# Patient Record
Sex: Female | Born: 1955 | Race: White | Hispanic: No | Marital: Single | Smoking: Current every day smoker
Health system: Southern US, Community
[De-identification: ages and names within clinical notes are randomized; demographics above are authoritative.]

## PROBLEM LIST (undated history)

## (undated) DIAGNOSIS — M5010 Cervical disc disorder with radiculopathy, unspecified cervical region: Secondary | ICD-10-CM

## (undated) DIAGNOSIS — Z720 Tobacco use: Secondary | ICD-10-CM

## (undated) DIAGNOSIS — N189 Chronic kidney disease, unspecified: Secondary | ICD-10-CM

## (undated) DIAGNOSIS — N3010 Interstitial cystitis (chronic) without hematuria: Secondary | ICD-10-CM

## (undated) DIAGNOSIS — I739 Peripheral vascular disease, unspecified: Secondary | ICD-10-CM

## (undated) DIAGNOSIS — H9190 Unspecified hearing loss, unspecified ear: Secondary | ICD-10-CM

## (undated) DIAGNOSIS — I10 Essential (primary) hypertension: Secondary | ICD-10-CM

## (undated) DIAGNOSIS — J449 Chronic obstructive pulmonary disease, unspecified: Secondary | ICD-10-CM

## (undated) DIAGNOSIS — Z87442 Personal history of urinary calculi: Secondary | ICD-10-CM

## (undated) DIAGNOSIS — E785 Hyperlipidemia, unspecified: Secondary | ICD-10-CM

## (undated) DIAGNOSIS — H8109 Meniere's disease, unspecified ear: Secondary | ICD-10-CM

## (undated) HISTORY — PX: CARPAL TUNNEL RELEASE: SHX101

## (undated) HISTORY — DX: Tobacco use: Z72.0

## (undated) HISTORY — PX: OTHER SURGICAL HISTORY: SHX169

## (undated) HISTORY — DX: Cervical disc disorder with radiculopathy, unspecified cervical region: M50.10

## (undated) HISTORY — DX: Essential (primary) hypertension: I10

## (undated) HISTORY — PX: ABDOMINAL HYSTERECTOMY: SHX81

## (undated) HISTORY — DX: Personal history of urinary calculi: Z87.442

## (undated) HISTORY — DX: Interstitial cystitis (chronic) without hematuria: N30.10

## (undated) HISTORY — DX: Hyperlipidemia, unspecified: E78.5

## (undated) HISTORY — PX: KNEE ARTHROSCOPY: SUR90

## (undated) HISTORY — PX: BREAST EXCISIONAL BIOPSY: SUR124

## (undated) HISTORY — DX: Peripheral vascular disease, unspecified: I73.9

## (undated) HISTORY — PX: BREAST SURGERY: SHX581

---

## 1990-08-22 HISTORY — PX: TOTAL ABDOMINAL HYSTERECTOMY W/ BILATERAL SALPINGOOPHORECTOMY: SHX83

## 1998-09-27 LAB — HM PAP SMEAR

## 2003-09-16 ENCOUNTER — Other Ambulatory Visit: Payer: Self-pay

## 2004-12-09 ENCOUNTER — Ambulatory Visit: Payer: Self-pay | Admitting: Urology

## 2006-05-26 ENCOUNTER — Ambulatory Visit: Payer: Self-pay | Admitting: Urology

## 2006-05-29 ENCOUNTER — Ambulatory Visit: Payer: Self-pay | Admitting: Urology

## 2006-05-30 ENCOUNTER — Other Ambulatory Visit: Payer: Self-pay

## 2006-06-01 ENCOUNTER — Inpatient Hospital Stay: Payer: Self-pay | Admitting: Internal Medicine

## 2006-06-01 ENCOUNTER — Other Ambulatory Visit: Payer: Self-pay

## 2006-06-08 ENCOUNTER — Ambulatory Visit: Payer: Self-pay | Admitting: Urology

## 2006-06-14 ENCOUNTER — Ambulatory Visit: Payer: Self-pay | Admitting: Urology

## 2006-10-26 ENCOUNTER — Ambulatory Visit: Payer: Self-pay | Admitting: Urology

## 2007-04-26 ENCOUNTER — Emergency Department: Payer: Self-pay | Admitting: Emergency Medicine

## 2007-07-30 ENCOUNTER — Ambulatory Visit: Payer: Self-pay | Admitting: Urology

## 2007-07-31 ENCOUNTER — Ambulatory Visit: Payer: Self-pay | Admitting: Urology

## 2008-06-25 ENCOUNTER — Ambulatory Visit: Payer: Self-pay | Admitting: Urology

## 2008-07-30 ENCOUNTER — Ambulatory Visit: Payer: Self-pay | Admitting: Urology

## 2009-02-06 ENCOUNTER — Ambulatory Visit: Payer: Self-pay | Admitting: Urology

## 2009-08-28 ENCOUNTER — Ambulatory Visit: Payer: Self-pay | Admitting: Urology

## 2009-09-22 DIAGNOSIS — I739 Peripheral vascular disease, unspecified: Secondary | ICD-10-CM

## 2009-09-22 HISTORY — DX: Peripheral vascular disease, unspecified: I73.9

## 2010-06-03 ENCOUNTER — Ambulatory Visit: Payer: Self-pay | Admitting: Internal Medicine

## 2010-07-13 ENCOUNTER — Ambulatory Visit: Payer: Self-pay | Admitting: Internal Medicine

## 2010-09-01 ENCOUNTER — Ambulatory Visit: Payer: Self-pay | Admitting: Urology

## 2010-11-08 ENCOUNTER — Emergency Department: Payer: Self-pay | Admitting: Emergency Medicine

## 2010-12-19 LAB — HM MAMMOGRAPHY: HM Mammogram: NORMAL

## 2011-01-28 ENCOUNTER — Ambulatory Visit: Payer: Self-pay | Admitting: Urology

## 2011-05-27 ENCOUNTER — Other Ambulatory Visit: Payer: Self-pay | Admitting: Internal Medicine

## 2011-05-27 MED ORDER — BUPROPION HCL ER (SR) 150 MG PO TB12: 150.0000 mg | ORAL_TABLET | Freq: Two times a day (BID) | ORAL | Status: DC

## 2011-07-25 ENCOUNTER — Encounter: Payer: Self-pay | Admitting: Internal Medicine

## 2011-07-25 ENCOUNTER — Other Ambulatory Visit: Payer: Self-pay | Admitting: Internal Medicine

## 2011-07-25 ENCOUNTER — Ambulatory Visit (INDEPENDENT_AMBULATORY_CARE_PROVIDER_SITE_OTHER): Payer: BC Managed Care – PPO | Admitting: Internal Medicine

## 2011-07-25 VITALS — BP 142/80 | HR 98 | Temp 98.2°F | Resp 16 | Ht <= 58 in | Wt 161.5 lb

## 2011-07-25 DIAGNOSIS — F17200 Nicotine dependence, unspecified, uncomplicated: Secondary | ICD-10-CM | POA: Insufficient documentation

## 2011-07-25 DIAGNOSIS — L03211 Cellulitis of face: Secondary | ICD-10-CM | POA: Insufficient documentation

## 2011-07-25 DIAGNOSIS — L0201 Cutaneous abscess of face: Secondary | ICD-10-CM

## 2011-07-25 DIAGNOSIS — Z720 Tobacco use: Secondary | ICD-10-CM | POA: Insufficient documentation

## 2011-07-25 DIAGNOSIS — Z23 Encounter for immunization: Secondary | ICD-10-CM

## 2011-07-25 DIAGNOSIS — Z87891 Personal history of nicotine dependence: Secondary | ICD-10-CM | POA: Insufficient documentation

## 2011-07-25 LAB — CBC WITH DIFFERENTIAL/PLATELET
Basophils Relative: 0.2 % (ref 0.0–3.0)
Eosinophils Relative: 1 % (ref 0.0–5.0)
Lymphocytes Relative: 19.2 % (ref 12.0–46.0)
Neutrophils Relative %: 73.1 % (ref 43.0–77.0)
RBC: 4.63 Mil/uL (ref 3.87–5.11)
WBC: 12.8 10*3/uL — ABNORMAL HIGH (ref 4.5–10.5)

## 2011-07-25 LAB — COMPREHENSIVE METABOLIC PANEL
AST: 19 U/L (ref 0–37)
Albumin: 4.2 g/dL (ref 3.5–5.2)
BUN: 9 mg/dL (ref 6–23)
Calcium: 9.5 mg/dL (ref 8.4–10.5)
Chloride: 102 mEq/L (ref 96–112)
Glucose, Bld: 116 mg/dL — ABNORMAL HIGH (ref 70–99)
Potassium: 3.1 mEq/L — ABNORMAL LOW (ref 3.5–5.1)

## 2011-07-25 MED ORDER — HYDROCODONE-ACETAMINOPHEN 5-325 MG PO TABS: 1.0000 | ORAL_TABLET | Freq: Four times a day (QID) | ORAL | Status: DC | PRN

## 2011-07-25 MED ORDER — HYDROCODONE-ACETAMINOPHEN 5-500 MG PO TABS: 1.0000 | ORAL_TABLET | ORAL | Status: DC | PRN

## 2011-07-25 MED ORDER — AMOXICILLIN-POT CLAVULANATE 875-125 MG PO TABS: 1.0000 | ORAL_TABLET | Freq: Two times a day (BID) | ORAL | Status: AC

## 2011-07-25 NOTE — Patient Instructions (Signed)
You have a cellulitis of the face,  Caused by your bird bite.  Please start the antibiotics  tonight,  return on Wednesday for a quick recheck

## 2011-07-25 NOTE — Progress Notes (Signed)
  Subjective:    Patient ID: Patricia Fields, female    DOB: [birth year], 55 y.o.   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  Yesterday the pain was at its greatest, but today the pain and swelling has improved.  No sinus congestion or rhinorrea,  No conjjuncitvitis ,  No recent travel.  Thinks her pain is due to her "bad teeth."  After detailed questioning she recalls that her Belia Heman bit her on her right cheek 2 days ago but did not think that is was relevant.   Has had suective chills on and off since then.  No drainage form animal bite. Thinks her bird is up to date on vaccinations but doesn't know because the bird was a gift.  Past Medical History  Diagnosis Date  . Peripheral arterial disease Feb 2011    by renal artery duplex  . Hypertension   . Hyperlipidemia   . 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ervical region    Current Outpatient Prescriptions on File Prior to Visit  Medication Sig Dispense Refill  . buPROPion (WELLBUTRIN SR) 150 MG 12 hr tablet Take 1 tablet (150 mg total) by mouth 2 (two) times daily.  60 tablet  3      Review of Systems  Constitutional: Negative for fever, chills and unexpected weight change.  HENT: Negative for hearing loss, ear pain, nosebleeds, congestion, sore throat, facial swelling, rhinorrhea, sneezing, mouth sores, trouble swallowing, neck pain, neck stiffness, voice change, postnasal drip, sinus pressure, tinnitus and ear discharge.   Eyes: Negative for pain, discharge, redness and visual disturbance.  Respiratory: Negative for cough, chest tightness, shortness of breath, wheezing and stridor.   Cardiovascular: Negative for chest pain, palpitations and leg swelling.    Musculoskeletal: Negative for myalgias and arthralgias.  Skin: Negative for color change and rash.  Neurological: Negative for dizziness, weakness, light-headedness and headaches.  Hematological: Negative for adenopathy.       Objective:   Physical Exam  Constitutional: She is oriented to person, place, and time. She appears well-developed and well-nourished.  HENT:  Head:    Mouth/Throat: Oropharynx is clear and moist.  Eyes: EOM are normal. Pupils are equal, round, and reactive to light. No scleral icterus.  Neck: Normal range of motion. Neck supple. No JVD present. No thyromegaly present.  Cardiovascular: Normal rate, regular rhythm, normal heart sounds and intact distal pulses.   Pulmonary/Chest: Effort normal and breath sounds normal.  Abdominal: Soft. Bowel sounds are normal. She exhibits no mass. There is no tenderness.  Musculoskeletal: Normal range of motion. She exhibits no edema.  Lymphadenopathy:    She has no cervical adenopathy.  Neurological: She is alert and oriented to person, place, and time.  Skin: Skin is warm and dry.  Psychiatric: She has a normal mood and affect.          Assessment & Plan:

## 2011-07-26 ENCOUNTER — Encounter: Payer: Self-pay | Admitting: Internal Medicine

## 2011-07-26 DIAGNOSIS — M5010 Cervical disc disorder with radiculopathy, unspecified cervical region: Secondary | ICD-10-CM | POA: Insufficient documentation

## 2011-07-26 DIAGNOSIS — E785 Hyperlipidemia, unspecified: Secondary | ICD-10-CM | POA: Insufficient documentation

## 2011-07-26 DIAGNOSIS — I150 Renovascular hypertension: Secondary | ICD-10-CM | POA: Insufficient documentation

## 2011-07-26 DIAGNOSIS — I739 Peripheral vascular disease, unspecified: Secondary | ICD-10-CM | POA: Insufficient documentation

## 2011-07-26 NOTE — Assessment & Plan Note (Signed)
Still using tobacco despite discussion of risks ad known PAD/.  Brief counselling given.

## 2011-07-26 NOTE — Assessment & Plan Note (Signed)
Will treat empirically with augmentin and obtain vblood cultures and cbc. Recheck in 48 hours and obtain CT of face if no improvement to rule out abscess.

## 2011-07-28 MED ORDER — POTASSIUM CHLORIDE 20 MEQ PO PACK: 20.0000 meq | PACK | Freq: Every day | ORAL | Status: DC

## 2011-07-28 MED ORDER — LISINOPRIL-HYDROCHLOROTHIAZIDE 20-25 MG PO TABS: 1.0000 | ORAL_TABLET | Freq: Every day | ORAL | Status: DC

## 2011-07-28 NOTE — Progress Notes (Signed)
Addended by: Darletta Moll A on: 07/28/2011 10:54 AM   Modules accepted: Orders

## 2011-07-29 ENCOUNTER — Encounter: Payer: Self-pay | Admitting: Internal Medicine

## 2011-07-29 ENCOUNTER — Ambulatory Visit (INDEPENDENT_AMBULATORY_CARE_PROVIDER_SITE_OTHER): Payer: BC Managed Care – PPO | Admitting: Internal Medicine

## 2011-07-29 DIAGNOSIS — L03211 Cellulitis of face: Secondary | ICD-10-CM

## 2011-07-29 DIAGNOSIS — L0201 Cutaneous abscess of face: Secondary | ICD-10-CM

## 2011-07-29 NOTE — Progress Notes (Signed)
Subjective:    Patient ID: Patricia Fields, female    DOB: 12/26/1955, 55 y.o.   MRN: [id number]  HPI  Patricia Fields returns after 48 hours  for followup on facial cellulitis caused by a recent parrot bite to cheek.  She has been taking Augmentin without side effects.  Her facial erythema, pain, and swelling has nearly 100% resolved.   She denies fevers.  Past Medical History  Diagnosis Date  . Peripheral arterial disease Feb 2011    by renal artery duplex  . Hypertension   . Hyperlipidemia   . 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ervical region     Review of Systems  Constitutional: Negative for fever, chills and unexpected weight change.  HENT: Negative for hearing loss, ear pain, nosebleeds, congestion, sore throat, facial swelling, rhinorrhea, sneezing, mouth sores, trouble swallowing, neck pain, neck stiffness, voice change, postnasal drip, sinus pressure, tinnitus and ear discharge.   Eyes: Negative for pain, discharge, redness and visual disturbance.  Respiratory: Negative for cough, chest tightness, shortness of breath, wheezing and stridor.   Cardiovascular: Negative for chest pain, palpitations and leg swelling.  Musculoskeletal: Negative for myalgias and arthralgias.  Skin: Negative for color change and rash.  Neurological: Negative for dizziness, weakness, light-headedness and headaches.  Hematological: Negative for adenopathy.   Current Outpatient Prescriptions on File Prior to Visit  Medication Sig Dispense Refill  . amoxicillin-clavulanate (AUGMENTIN) 875-125 MG per tablet Take 1 tablet by mouth 2 (two) times daily.  14 tablet  0  . aspirin 81 MG tablet Take 81 mg by mouth daily.        [name] buPROPion (WELLBUTRIN SR) 150 MG 12 hr tablet Take 1 tablet (150 mg total) by mouth 2 (two) times daily.  60 tablet  3  . Cholecalciferol (VITAMIN D3) 1000 UNITS CAPS Take by mouth.        [name] HYDROcodone-acetaminophen (NORCO) 5-325 MG per tablet Take 1 tablet by mouth every 6 (six)  hours as needed.  30 tablet  0  . lisinopril-hydrochlorothiazide (PRINZIDE,ZESTORETIC) 20-25 MG per tablet Take 1 tablet by mouth daily.  30 tablet  3  . metoprolol succinate (TOPROL-XL) 25 MG 24 hr tablet Take 25 mg by mouth daily.        . potassium chloride (KLOR-CON) 20 MEQ packet Take 20 mEq by mouth daily. With food for 7 days.  7 tablet  0  . Red Yeast Rice Extract (RED YEAST RICE PO) Take by mouth.        . vitamin B-12 (CYANOCOBALAMIN) 500 MCG tablet Take 500 mcg by mouth daily.             Objective:   Physical Exam  Constitutional: She is oriented to person, place, and time. She appears well-developed and well-nourished.  HENT:  Mouth/Throat: Oropharynx is clear and moist.  Eyes: EOM are normal. Pupils are equal, round, and reactive to light. No scleral icterus.  Neck: Normal range of motion. Neck supple. No JVD present. No thyromegaly present.  Cardiovascular: Normal rate, regular rhythm, normal heart sounds and intact distal pulses.   Pulmonary/Chest: Effort normal and breath sounds normal.  Abdominal: Soft. Bowel sounds are normal. She exhibits no mass. There is no tenderness.  Musculoskeletal: Normal range of motion. She exhibits no edema.  Lymphadenopathy:    She has no cervical adenopathy.  Neurological: She is alert and oriented to person, place, and time.  Skin: Skin is warm and dry.  Psychiatric: She has a normal mood and  affect.          Assessment & Plan:

## 2011-07-31 NOTE — Assessment & Plan Note (Addendum)
Zoonotic infection, secondary to parrot bite, now clinically resolved.  Blood cultures were negative .  Continue Augmen in for a total of 7 days.  She received her TdaP as a booster on Dec 3

## 2011-08-01 LAB — CULTURE, BLOOD (SINGLE): Organism ID, Bacteria: NO GROWTH

## 2011-08-03 ENCOUNTER — Ambulatory Visit: Payer: BC Managed Care – PPO | Admitting: Internal Medicine

## 2011-08-18 ENCOUNTER — Encounter: Payer: Self-pay | Admitting: Internal Medicine

## 2011-09-08 ENCOUNTER — Ambulatory Visit: Payer: Self-pay | Admitting: Urology

## 2011-09-28 LAB — HM MAMMOGRAPHY: HM Mammogram: NORMAL

## 2011-10-13 ENCOUNTER — Ambulatory Visit (INDEPENDENT_AMBULATORY_CARE_PROVIDER_SITE_OTHER): Payer: BC Managed Care – PPO | Admitting: Internal Medicine

## 2011-10-13 ENCOUNTER — Telehealth: Payer: Self-pay | Admitting: *Deleted

## 2011-10-13 ENCOUNTER — Encounter: Payer: Self-pay | Admitting: Internal Medicine

## 2011-10-13 VITALS — BP 140/70 | HR 92 | Temp 98.3°F | Wt 161.0 lb

## 2011-10-13 DIAGNOSIS — R059 Cough, unspecified: Secondary | ICD-10-CM

## 2011-10-13 DIAGNOSIS — F17200 Nicotine dependence, unspecified, uncomplicated: Secondary | ICD-10-CM

## 2011-10-13 DIAGNOSIS — R05 Cough: Secondary | ICD-10-CM

## 2011-10-13 DIAGNOSIS — Z720 Tobacco use: Secondary | ICD-10-CM

## 2011-10-13 DIAGNOSIS — J40 Bronchitis, not specified as acute or chronic: Secondary | ICD-10-CM

## 2011-10-13 MED ORDER — HYDROCODONE-ACETAMINOPHEN 5-325 MG PO TABS: 1.0000 | ORAL_TABLET | Freq: Four times a day (QID) | ORAL | Status: DC | PRN

## 2011-10-13 MED ORDER — AMOXICILLIN-POT CLAVULANATE 875-125 MG PO TABS: 1.0000 | ORAL_TABLET | Freq: Two times a day (BID) | ORAL | Status: AC

## 2011-10-13 MED ORDER — PREDNISONE (PAK) 10 MG PO TABS: ORAL_TABLET | ORAL | Status: AC

## 2011-10-13 MED ORDER — ALBUTEROL SULFATE (5 MG/ML) 0.5% IN NEBU
2.5000 mg | INHALATION_SOLUTION | Freq: Once | RESPIRATORY_TRACT | Status: AC
  Administered 2011-10-13: 2.5 mg via RESPIRATORY_TRACT

## 2011-10-13 MED ORDER — METHYLPREDNISOLONE ACETATE 40 MG/ML IJ SUSP
40.0000 mg | Freq: Once | INTRAMUSCULAR | Status: AC
  Administered 2011-10-13: 40 mg via INTRAMUSCULAR

## 2011-10-13 NOTE — Telephone Encounter (Signed)
Pt was seen in office today, says a cough medicine was to have been called to [facility] but wasn't.  I saw order for norco in chart note from today, called as written to [facility] [address].

## 2011-10-13 NOTE — Progress Notes (Signed)
Subjective:    Patient ID: Patricia Fields, female    DOB: 15-Oct-1955, 56 y.o.   MRN: [id number]  HPI  56 yr old smoker presents with 3 day history of cough , wheezing, fever.  Cough is nonproductive,  No achy feelings.  Started with sinus congestion and yellow sinus drainage .  Ears habe been feeling plugged and left ear is painful.  Still smoking.   Past Medical History  Diagnosis Date  . Peripheral arterial disease Feb 2011    by renal artery duplex  . Hypertension   . Hyperlipidemia   . 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ervical region    Current Outpatient Prescriptions on File Prior to Visit  Medication Sig Dispense Refill  . aspirin 81 MG tablet Take 81 mg by mouth daily.        [name] buPROPion (WELLBUTRIN SR) 150 MG 12 hr tablet Take 1 tablet (150 mg total) by mouth 2 (two) times daily.  60 tablet  3  . Cholecalciferol (VITAMIN D3) 1000 UNITS CAPS Take by mouth.        [name] lisinopril-hydrochlorothiazide (PRINZIDE,ZESTORETIC) 20-25 MG per tablet Take 1 tablet by mouth daily.  30 tablet  3  . metoprolol succinate (TOPROL-XL) 25 MG 24 hr tablet Take 25 mg by mouth daily.        . potassium chloride (KLOR-CON) 20 MEQ packet Take 20 mEq by mouth daily. With food for 7 days.  7 tablet  0  . Red Yeast Rice Extract (RED YEAST RICE PO) Take by mouth.        . vitamin B-12 (CYANOCOBALAMIN) 500 MCG tablet Take 500 mcg by mouth daily.          Review of Systems  Constitutional: Negative for fever, chills and unexpected weight change.  HENT: Negative for hearing loss, ear pain, nosebleeds, congestion, sore throat, facial swelling, rhinorrhea, sneezing, mouth sores, trouble swallowing, neck pain, neck stiffness, voice change, postnasal drip, sinus pressure, tinnitus and ear discharge.   Eyes: Negative for pain, discharge, redness and visual disturbance.  Respiratory: Negative for cough, chest tightness, shortness of breath, wheezing and stridor.   Cardiovascular: Negative for chest pain,  palpitations and leg swelling.  Musculoskeletal: Negative for myalgias and arthralgias.  Skin: Negative for color change and rash.  Neurological: Negative for dizziness, weakness, light-headedness and headaches.  Hematological: Negative for adenopathy.       Objective:   Physical Exam  Constitutional: She is oriented to person, place, and time. She appears well-developed and well-nourished.  HENT:  Mouth/Throat: Oropharynx is clear and moist.  Eyes: EOM are normal. Pupils are equal, round, and reactive to light. No scleral icterus.  Neck: Normal range of motion. Neck supple. No JVD present. No thyromegaly present.  Cardiovascular: Normal rate, regular rhythm, normal heart sounds and intact distal pulses.   Pulmonary/Chest: Effort normal and breath sounds normal.  Abdominal: Soft. Bowel sounds are normal. She exhibits no mass. There is no tenderness.  Musculoskeletal: Normal range of motion. She exhibits no edema.  Lymphadenopathy:    She has no cervical adenopathy.  Neurological: She is alert and oriented to person, place, and time.  Skin: Skin is warm and dry.  Psychiatric: She has a normal mood and affect.      Assessment & Plan:   Bronchitis With concufrrent sinusitis.  ABx presnidosne, decongestant,  MDIs Smoking cessation advised.   Tobacco abuse counselling given; cessation recommended, she has not smoked in 3 days.     Updated  Medication List Outpatient Encounter Prescriptions as of 10/13/2011  Medication Sig Dispense Refill  . amoxicillin-clavulanate (AUGMENTIN) 875-125 MG per tablet Take 1 tablet by mouth 2 (two) times daily. 6 tablets on Day 1 , decrease by 1 tablet daily until gone  14 tablet  10  . aspirin 81 MG tablet Take 81 mg by mouth daily.        [name] buPROPion (WELLBUTRIN SR) 150 MG 12 hr tablet Take 1 tablet (150 mg total) by mouth 2 (two) times daily.  60 tablet  3  . Cholecalciferol (VITAMIN D3) 1000 UNITS CAPS Take by mouth.        [name]  HYDROcodone-acetaminophen (NORCO) 5-325 MG per tablet Take 1 tablet by mouth every 6 (six) hours as needed (cough ).  60 tablet  0  . lisinopril-hydrochlorothiazide (PRINZIDE,ZESTORETIC) 20-25 MG per tablet Take 1 tablet by mouth daily.  30 tablet  3  . metoprolol succinate (TOPROL-XL) 25 MG 24 hr tablet Take 25 mg by mouth daily.        . potassium chloride (KLOR-CON) 20 MEQ packet Take 20 mEq by mouth daily. With food for 7 days.  7 tablet  0  . predniSONE (STERAPRED UNI-PAK) 10 MG tablet 6 tablets on Day 1 , then reduce by 1 tablet daily until gone  21 tablet  0  . Red Yeast Rice Extract (RED YEAST RICE PO) Take by mouth.        . vitamin B-12 (CYANOCOBALAMIN) 500 MCG tablet Take 500 mcg by mouth daily.        [name] DISCONTD: HYDROcodone-acetaminophen (NORCO) 5-325 MG per tablet Take 1 tablet by mouth every 6 (six) hours as needed.  30 tablet  0  . albuterol (PROVENTIL) (5 MG/ML) 0.5% nebulizer solution 2.5 mg       . methylPREDNISolone acetate (DEPO-MEDROL) injection 40 mg

## 2011-10-13 NOTE — Patient Instructions (Signed)
Simply Saline flush sinuses twice daily   Augmentin for the infection twice daily for one week   Afrin nasal spray as needed for congestion  Prednisone taper (steroid) 6 tablets all at once on Day 1 , then 5 tablets the next day,  4 the next day and so on

## 2011-10-16 ENCOUNTER — Encounter: Payer: Self-pay | Admitting: Internal Medicine

## 2011-10-16 DIAGNOSIS — J449 Chronic obstructive pulmonary disease, unspecified: Secondary | ICD-10-CM | POA: Insufficient documentation

## 2011-10-16 DIAGNOSIS — J441 Chronic obstructive pulmonary disease with (acute) exacerbation: Secondary | ICD-10-CM | POA: Insufficient documentation

## 2011-10-16 NOTE — Assessment & Plan Note (Signed)
With concufrrent sinusitis.  ABx presnidosne, decongestant,  MDIs Smoking cessation advised.

## 2011-10-16 NOTE — Assessment & Plan Note (Signed)
counselling given; cessation recommended, she has not smoked in 3 days.

## 2011-11-24 ENCOUNTER — Other Ambulatory Visit: Payer: Self-pay | Admitting: Internal Medicine

## 2011-12-15 ENCOUNTER — Other Ambulatory Visit: Payer: Self-pay | Admitting: Internal Medicine

## 2012-01-24 ENCOUNTER — Other Ambulatory Visit: Payer: BC Managed Care – PPO

## 2012-01-24 ENCOUNTER — Telehealth: Payer: Self-pay | Admitting: *Deleted

## 2012-01-24 DIAGNOSIS — R739 Hyperglycemia, unspecified: Secondary | ICD-10-CM

## 2012-01-24 DIAGNOSIS — R5383 Other fatigue: Secondary | ICD-10-CM

## 2012-01-24 DIAGNOSIS — E785 Hyperlipidemia, unspecified: Secondary | ICD-10-CM

## 2012-01-24 NOTE — Telephone Encounter (Signed)
Placed in epic,  thanks

## 2012-01-24 NOTE — Telephone Encounter (Signed)
Patient is coming in for labs tomorrow. What labs would you like for me to order?

## 2012-01-25 ENCOUNTER — Other Ambulatory Visit (INDEPENDENT_AMBULATORY_CARE_PROVIDER_SITE_OTHER): Payer: BC Managed Care – PPO | Admitting: *Deleted

## 2012-01-25 DIAGNOSIS — R7309 Other abnormal glucose: Secondary | ICD-10-CM

## 2012-01-25 DIAGNOSIS — R5381 Other malaise: Secondary | ICD-10-CM

## 2012-01-25 DIAGNOSIS — R5383 Other fatigue: Secondary | ICD-10-CM

## 2012-01-25 DIAGNOSIS — R739 Hyperglycemia, unspecified: Secondary | ICD-10-CM

## 2012-01-25 DIAGNOSIS — E785 Hyperlipidemia, unspecified: Secondary | ICD-10-CM

## 2012-01-25 LAB — CBC WITH DIFFERENTIAL/PLATELET
Basophils Absolute: 0 10*3/uL (ref 0.0–0.1)
Basophils Relative: 0.3 % (ref 0.0–3.0)
Eosinophils Absolute: 0.2 10*3/uL (ref 0.0–0.7)
Hemoglobin: 14.1 g/dL (ref 12.0–15.0)
MCHC: 33.9 g/dL (ref 30.0–36.0)
MCV: 96 fl (ref 78.0–100.0)
Monocytes Absolute: 0.4 10*3/uL (ref 0.1–1.0)
Neutro Abs: 5 10*3/uL (ref 1.4–7.7)
Neutrophils Relative %: 64.1 % (ref 43.0–77.0)
RBC: 4.31 Mil/uL (ref 3.87–5.11)
RDW: 13.6 % (ref 11.5–14.6)

## 2012-01-25 LAB — COMPLETE METABOLIC PANEL WITH GFR
ALT: 19 U/L (ref 0–35)
AST: 16 U/L (ref 0–37)
CO2: 23 mEq/L (ref 19–32)
Calcium: 9.4 mg/dL (ref 8.4–10.5)
Chloride: 107 mEq/L (ref 96–112)
Creat: 0.95 mg/dL (ref 0.50–1.10)
GFR, Est African American: 77 mL/min
Sodium: 142 mEq/L (ref 135–145)
Total Protein: 6.8 g/dL (ref 6.0–8.3)

## 2012-01-25 LAB — LIPID PANEL
Cholesterol: 212 mg/dL — ABNORMAL HIGH (ref 0–200)
Total CHOL/HDL Ratio: 5
VLDL: 32 mg/dL (ref 0.0–40.0)

## 2012-01-27 ENCOUNTER — Encounter: Payer: BC Managed Care – PPO | Admitting: Internal Medicine

## 2012-02-29 ENCOUNTER — Other Ambulatory Visit: Payer: Self-pay | Admitting: Internal Medicine

## 2012-03-07 ENCOUNTER — Ambulatory Visit: Payer: Self-pay | Admitting: Urology

## 2012-03-19 ENCOUNTER — Ambulatory Visit (INDEPENDENT_AMBULATORY_CARE_PROVIDER_SITE_OTHER): Payer: BC Managed Care – PPO | Admitting: Internal Medicine

## 2012-03-19 ENCOUNTER — Encounter: Payer: Self-pay | Admitting: Internal Medicine

## 2012-03-19 VITALS — BP 122/70 | HR 75 | Temp 98.3°F | Resp 16 | Ht 58.5 in | Wt 160.8 lb

## 2012-03-19 DIAGNOSIS — Z124 Encounter for screening for malignant neoplasm of cervix: Secondary | ICD-10-CM

## 2012-03-19 DIAGNOSIS — M5412 Radiculopathy, cervical region: Secondary | ICD-10-CM

## 2012-03-19 DIAGNOSIS — F17200 Nicotine dependence, unspecified, uncomplicated: Secondary | ICD-10-CM

## 2012-03-19 DIAGNOSIS — Z87442 Personal history of urinary calculi: Secondary | ICD-10-CM | POA: Insufficient documentation

## 2012-03-19 DIAGNOSIS — E66811 Obesity, class 1: Secondary | ICD-10-CM | POA: Insufficient documentation

## 2012-03-19 DIAGNOSIS — M25559 Pain in unspecified hip: Secondary | ICD-10-CM

## 2012-03-19 DIAGNOSIS — N3010 Interstitial cystitis (chronic) without hematuria: Secondary | ICD-10-CM | POA: Insufficient documentation

## 2012-03-19 DIAGNOSIS — E669 Obesity, unspecified: Secondary | ICD-10-CM | POA: Insufficient documentation

## 2012-03-19 DIAGNOSIS — Z1239 Encounter for other screening for malignant neoplasm of breast: Secondary | ICD-10-CM

## 2012-03-19 DIAGNOSIS — Z720 Tobacco use: Secondary | ICD-10-CM

## 2012-03-19 DIAGNOSIS — M5010 Cervical disc disorder with radiculopathy, unspecified cervical region: Secondary | ICD-10-CM

## 2012-03-19 DIAGNOSIS — Z1211 Encounter for screening for malignant neoplasm of colon: Secondary | ICD-10-CM | POA: Insufficient documentation

## 2012-03-19 DIAGNOSIS — M25551 Pain in right hip: Secondary | ICD-10-CM

## 2012-03-19 LAB — HM COLONOSCOPY

## 2012-03-19 MED ORDER — PRAVASTATIN SODIUM 20 MG PO TABS: 20.0000 mg | ORAL_TABLET | Freq: Every day | ORAL | Status: DC

## 2012-03-19 MED ORDER — NABUMETONE 750 MG PO TABS: 750.0000 mg | ORAL_TABLET | Freq: Two times a day (BID) | ORAL | Status: AC

## 2012-03-19 NOTE — Assessment & Plan Note (Signed)
She is status post TAH/BSO. Screening is required

## 2012-03-19 NOTE — Assessment & Plan Note (Signed)
Symptoms are currently under control.

## 2012-03-19 NOTE — Assessment & Plan Note (Signed)
Spent 3 minutes discussing risk of continued tobacco abuse, including but not limited to CAD, PAD, hypertension, and CA.  He is not interested in pharmacotherapy at this time. 
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
## 2012-03-19 NOTE — Assessment & Plan Note (Signed)
Mammogram has been ordered. This exam is normal.

## 2012-03-19 NOTE — Patient Instructions (Addendum)
For your right hip:  1)  X rays  2) nabumetone  twice daily   AND    3) tylenol  500  Or 1000  mg twice daily   For your cholesterol :  1) try pravastatin 20 mg daily INSTEAD of red yeast rice if tolerated.  If not,  Resume the red yeast rice twice daily    For your weight;   Consider a Low Glycemic Index Diet and eating 6 smaller meals daily .  This frequent feeding stimulates your metabolism and the lower glycemic index foods will lower your blood sugars:   This is an example of my daily  "Low GI"  Diet:  All of the foods can be found at grocery stores and in bulk at [employer]  club   7 AM Breakfast:  Low carbohydrate Protein  Shakes (I recommend the [employer] "Carb Control" shakes  Or the low carb shakes by Atkins.   Both are available everywhere:  In  cases at [employer]  Or in 4 packs at grocery stores and pharmacies  2.5 carbs  (Alternative is  a toasted Arnold's Sandwhich Thin w/ peanut butter, a "Bagel Thin" with cream cheese and salmon) or  a scrambled egg burrito made with a low carb tortilla .  Avoid cereal and bananas, oatmeal too unless the old fashioned kind that takes 30-40 minutes to prepare.  the rest is overly processed, has minimal fiber, and loaded with carbohydrates!   10 AM: Protein bar by Atkins (the snack size, under 200 cal.  There are many varieties , available widely again or in bulk in limited varieties at [employer])  Other so called "protein bars" tend to be loaded with carbohydrates.  Remember, in food advertising, the word "energy" is synonymous for " carbohydrate."  Lunch: sandwich of [place], (or any lunchmeat or canned tuna), fresh avocado and cheese on a lower carbohydrate pita bread, flatbread, or tortilla . Ok to use mayonnaise. The bread is the only source or carbohydrate that can be decreased (Joseph's makes a pita bread and a flat bread  Are 50 cal and 4 net carbs ; Toufayan makes a low carb flatbread 100 cal and 9 net carbs  and  [place] makes a low carb whole wheat tortilla   210 cal and 6 net carbs)  3 PM:  Mid day :  Another protein bar,  Or a  cheese stick (100 cal, 0 carbs),  Or 1 ounce of  almonds, walnuts, pistachios, pecans, peanuts,  Macadamia nuts. Or a Dannon light n Fit greek yogurt, 80 cal 8 net carbs . Avoid "granola"; the dried cranberries and raisins are loaded with carbohydrates.    6 PM  Dinner:  "mean and green:"  Meat/chicken/fish or a high protein legume; , with a green salad, and a low GI  Veggie (broccoli, cauliflower, green beans, spinach, brussel sprouts. Lima beans) : Avoid "Low fat dressings, Reyne Dumas and [address]! They are loaded with sugar! Instead use ranch, vinagrette,  Blue cheese, etc  9 PM snack : Breyer's "low carb" fudgsicle or  ice cream bar (Carb Smart line), or  Weight Watcher's ice cream bar , or anouther "no sugar added" ice cream; or another protein shake or a serving of fresh fruit with whipped cream (Avoid bananas, pineapple, grapes  and watermelon on a regular basis because they are high in sugar)   Remember that snack Substitutions should be less than 15 to 20 carbs  Per serving. Remember to subtract fiber grams  to get the "net carbs."

## 2012-03-22 ENCOUNTER — Ambulatory Visit (INDEPENDENT_AMBULATORY_CARE_PROVIDER_SITE_OTHER)
Admission: RE | Admit: 2012-03-22 | Discharge: 2012-03-22 | Disposition: A | Discharge status: Home / Self Care | Payer: BC Managed Care – PPO | Source: Ambulatory Visit | Attending: Internal Medicine | Admitting: Internal Medicine

## 2012-03-22 DIAGNOSIS — M25559 Pain in unspecified hip: Secondary | ICD-10-CM

## 2012-03-22 DIAGNOSIS — M25551 Pain in right hip: Secondary | ICD-10-CM

## 2012-03-23 NOTE — Addendum Note (Signed)
Addended by: Duncan Dull on: 03/23/2012 01:20 PM   Modules accepted: Orders

## 2012-03-30 ENCOUNTER — Other Ambulatory Visit: Payer: Self-pay | Admitting: Internal Medicine

## 2012-04-25 ENCOUNTER — Ambulatory Visit: Payer: Self-pay | Admitting: Internal Medicine

## 2012-05-02 ENCOUNTER — Encounter: Payer: Self-pay | Admitting: Internal Medicine

## 2012-05-03 ENCOUNTER — Other Ambulatory Visit: Payer: Self-pay | Admitting: Internal Medicine

## 2012-06-05 ENCOUNTER — Telehealth: Payer: Self-pay | Admitting: Internal Medicine

## 2012-06-05 NOTE — Telephone Encounter (Signed)
Caller: [name]/Patient; Patient Name: Patricia Fields; PCP: Duncan Dull (Adults only); Best Callback Phone Number: [phone]; Reason for call: Cough/Congestion. Onset 05/31/12:  Pt reports that she think she has a sinus infection; she has a headache,  ear ache and she also states the right side of her face is swollen.  Pt is afebrile.  Triaged patient per Upper Respiratory Infection Protocol.  See Provider within 24 hours Disposition for 'Mild to moderate headache for more than 24 hours unrelieved with nonprescription medications'.  Home care advice given.  All appts in [facility] were full.  RN offered appt at [facility] and pt declined.  Pt states she has no one to take her to [facility].  NOTE:  Office please follow up with pt if she can be worked into the schedule at the office.

## 2012-06-05 NOTE — Telephone Encounter (Signed)
Please offer patient appointment tomorrow to make sure she understands the only deal with her sinus infection infection

## 2012-06-07 ENCOUNTER — Telehealth: Payer: Self-pay | Admitting: Internal Medicine

## 2012-06-07 ENCOUNTER — Encounter: Payer: Self-pay | Admitting: Internal Medicine

## 2012-06-07 ENCOUNTER — Ambulatory Visit (INDEPENDENT_AMBULATORY_CARE_PROVIDER_SITE_OTHER): Payer: BC Managed Care – PPO | Admitting: Internal Medicine

## 2012-06-07 ENCOUNTER — Other Ambulatory Visit: Payer: Self-pay | Admitting: Internal Medicine

## 2012-06-07 VITALS — BP 138/80 | HR 83 | Temp 99.2°F | Resp 18 | Ht 59.0 in | Wt 154.0 lb

## 2012-06-07 DIAGNOSIS — J40 Bronchitis, not specified as acute or chronic: Secondary | ICD-10-CM

## 2012-06-07 DIAGNOSIS — J019 Acute sinusitis, unspecified: Secondary | ICD-10-CM

## 2012-06-07 DIAGNOSIS — J069 Acute upper respiratory infection, unspecified: Secondary | ICD-10-CM

## 2012-06-07 MED ORDER — FLUTICASONE PROPIONATE HFA 110 MCG/ACT IN AERO: 1.0000 | INHALATION_SPRAY | Freq: Two times a day (BID) | RESPIRATORY_TRACT | Status: DC

## 2012-06-07 MED ORDER — AZITHROMYCIN 250 MG PO TABS: ORAL_TABLET | ORAL | Status: DC

## 2012-06-07 MED ORDER — ALBUTEROL SULFATE HFA 108 (90 BASE) MCG/ACT IN AERS: 2.0000 | INHALATION_SPRAY | Freq: Four times a day (QID) | RESPIRATORY_TRACT | Status: DC | PRN

## 2012-06-07 MED ORDER — BECLOMETHASONE DIPROPIONATE 80 MCG/ACT IN AERS: 1.0000 | INHALATION_SPRAY | Freq: Two times a day (BID) | RESPIRATORY_TRACT | Status: DC

## 2012-06-07 NOTE — Telephone Encounter (Signed)
The patient can't afford the inhalers is there something else she can take in its place.

## 2012-06-07 NOTE — Patient Instructions (Addendum)
It was nice meeting you today.  I am sorry you are not feeling well.  I am going to prescribe an antibiotic - Zpak.  Take as directed.  I am also going to give you an Albuterol inhaler - 2 puffs 4x/day as needed and Flovent (steroid inhaler) - 1-2 puffs 2x/day.  (Rinse mouth after using the Flovent inhaler).  Saline nasal spray - flush nose at least 2-3x/day.  Let us know if symptoms do not resolve or if any worsening.

## 2012-06-07 NOTE — Telephone Encounter (Signed)
Error

## 2012-06-07 NOTE — Telephone Encounter (Signed)
Patient is coming in this afternoon at 1:30 to see Dr. Lorin Picket.

## 2012-06-08 ENCOUNTER — Encounter: Payer: Self-pay | Admitting: Internal Medicine

## 2012-06-08 NOTE — Progress Notes (Signed)
Patient notified via phone .

## 2012-06-08 NOTE — Progress Notes (Signed)
  Subjective:    Patient ID: Patricia Fields, female    DOB: 02/04/1956, 56 y.o.   MRN: [id number]  HPI 56 year old female with past history of tobacco abuse, hypertension, hypercholesterolemia and bronchitis who comes in today as a work in with concerns regarding increased congestion and cough.  States she noticed some ear pain and then facial swelling approximately 5-6 days ago.  The facial swelling subsided and she denies any significant ear pain now.  She does report some increased nasal congestion and sinus pressure.  Sore throat.  Drinking fluids.  Decreased appetite.  No vomiting or nausea.  Increased chest congestion and cough.  Does smoke 1ppd.  Is not using inhalers.  Has required these in the past (uses when sick).    Past Medical History  Diagnosis Date  . Peripheral arterial disease Feb 2011    by renal artery duplex  . Hypertension   . Hyperlipidemia   . 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 History of nephrolithiasis   . Interstitial cystitis   . Tobacco abuse     one pack daily    Review of Systems Patient denies any headache, lightheadedness or dizziness.  Does report increased sinus pressure and nasal congestion.  Sore throat.  Productive mucus.  No chest pain, tightness or palpatations.  No significant increased shortness of breath.  Increased cough and chest congestion.  No nausea or vomiting.  No abdominal pain or cramping.  No bowel change, such as diarrhea, constipation, BRBPR or melana.  No urine change.        Objective:   Physical Exam Filed Vitals:   06/07/12 1415  BP: 138/80  Pulse: 83  Temp: 99.2 F (37.3 C)  Resp: 27   56 year old female in no acute distress.   HEENT:  Nares - clear slightly erythematous turbinates.  TMs visualized - without erythema.  OP- without lesions or erythema - poor dentition.    Minimal tenderness to palpation over the sinuses.  NECK:  Supple.  Minimal tenderness - right neck.  No adenopathy.       HEART:  Appears to be regular. LUNGS:  Without crackles.  Some wheezing noted with forced expiration.  Increased cough with forced expiration.     Assessment & Plan:  SINUSITIS/URI WITH POSSIBLE BRONCHITIS.  Treat with Zpak as directed.  (Pt requested Zpak.  States works well for her).  Saline nasal spray as directed.  Albuterol neb - given here in the office.  Pt tolerated well.  Lungs improved.  Increased air movement after the neb.  Post neb pulse ox - 97%.  Start Flovent 2 puffs bid and albuterol inhaler qid prn.  Rest.  Fluids.  Explained to her if symptoms changed, worsened or did not resolve - she was to be reevaluated.    SMOKING CESSATION.  Discussed the need to quit with her today.

## 2012-06-12 ENCOUNTER — Other Ambulatory Visit: Payer: Self-pay | Admitting: Internal Medicine

## 2012-06-12 NOTE — Telephone Encounter (Signed)
And and patient received the alternative inhaler: Qvar.  There are chief inhalers but this is the least expensive of the steroid inhalers;  this takes the place of Advair or Symbicort but not albuterol

## 2012-06-13 NOTE — Telephone Encounter (Signed)
Spoke to patient and advised her as directed.

## 2012-09-20 ENCOUNTER — Ambulatory Visit: Payer: BC Managed Care – PPO | Admitting: Internal Medicine

## 2012-09-27 ENCOUNTER — Ambulatory Visit: Payer: Self-pay | Admitting: Internal Medicine

## 2012-09-27 ENCOUNTER — Encounter: Payer: Self-pay | Admitting: Internal Medicine

## 2012-09-27 ENCOUNTER — Ambulatory Visit (INDEPENDENT_AMBULATORY_CARE_PROVIDER_SITE_OTHER): Payer: BC Managed Care – PPO | Admitting: Internal Medicine

## 2012-09-27 VITALS — BP 134/66 | HR 73 | Temp 98.2°F | Resp 16 | Wt 153.5 lb

## 2012-09-27 DIAGNOSIS — J449 Chronic obstructive pulmonary disease, unspecified: Secondary | ICD-10-CM

## 2012-09-27 DIAGNOSIS — F17200 Nicotine dependence, unspecified, uncomplicated: Secondary | ICD-10-CM

## 2012-09-27 DIAGNOSIS — Z720 Tobacco use: Secondary | ICD-10-CM

## 2012-09-27 DIAGNOSIS — M25539 Pain in unspecified wrist: Secondary | ICD-10-CM

## 2012-09-27 DIAGNOSIS — Z23 Encounter for immunization: Secondary | ICD-10-CM

## 2012-09-27 DIAGNOSIS — E669 Obesity, unspecified: Secondary | ICD-10-CM

## 2012-09-27 DIAGNOSIS — M25531 Pain in right wrist: Secondary | ICD-10-CM

## 2012-09-27 DIAGNOSIS — E785 Hyperlipidemia, unspecified: Secondary | ICD-10-CM

## 2012-09-27 MED ORDER — CELECOXIB 200 MG PO CAPS: 200.0000 mg | ORAL_CAPSULE | Freq: Two times a day (BID) | ORAL | Status: DC

## 2012-09-27 MED ORDER — HYDROCODONE-ACETAMINOPHEN 5-325 MG PO TABS: 1.0000 | ORAL_TABLET | Freq: Four times a day (QID) | ORAL | Status: DC | PRN

## 2012-09-27 NOTE — Progress Notes (Signed)
Patient ID: Patricia Fields, female   DOB: Apr 20, 1956, 58 y.o.   MRN: [id number]  Patient Active Problem List  Diagnosis  . Tobacco abuse  . Peripheral arterial disease  . Hypertension  . Hyperlipidemia  . 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ervical region  . Bronchitis  . History of nephrolithiasis  . Interstitial cystitis  . Obesity  . Right hip pain  . Breast screening, unspecified  . Screening for cervical cancer  . Right wrist pain    Subjective:  CC:   Chief Complaint  Patient presents with  . Follow-up    6 month    HPI:   Patricia Lick Smithis a 57 y.o. female who presents for follow up on obesity, COPD and hyperlipidemia.  After her last visit in October she started the low GI diet and  Lost 10 lbs , but gaine 3 over the holidays .    2) new complaint of riight proximal wrist pain  Unclear etiology.  Pain is persistent and occurred after 2 falls.  The first fall was minor,  She fell up a flight of stairs,  But the second fall occurred while crossing a door threshhold and she fell onto her outstretched hand.  This occurred a few weeks ago,  Wrist selled,  Fingers swelled,  Some bruising was noted ,  No prior evaluation.   Has been wearing a lightweight wrist brace which has not offered any real support to the wrist and symptoms have not improved.  The pain worse over the radial head, the anatomic snuff box and is aggravated by resisted flexion of 1 through 4 fingers.     Past Medical History  Diagnosis Date  . Peripheral arterial disease Feb 2011    by renal artery duplex  . Hypertension   . Hyperlipidemia   . 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 History of nephrolithiasis   . Interstitial cystitis   . Tobacco abuse     one pack daily    Past Surgical History  Procedure Date  . Total abdominal hysterectomy w/ bilateral salpingoophorectomy 1992    due to uterine tumor, and endometriosis   . Arthroscopy     left knee , remote  .  Breast surgery     left breast cyst, benign  . Carpal tunnel release     Dr. Hyacinth Meeker,  right hand          The following portions of the patient's history were reviewed and updated as appropriate: Allergies, current medications, and problem list.    Review of Systems:   12 Pt  review of systems was negative except those addressed in the HPI,     History   Social History  . Marital Status: Married    Spouse Name: N/A    Number of Children: N/A  . Years of Education: N/A   Occupational History  . Not on file.   Social History Main Topics  . Smoking status: Current Every Day Smoker -- 1.0 packs/day    Types: Cigarettes  . Smokeless tobacco: Never Used  . Alcohol Use: No  . Drug Use: No  . Sexually Active: Not Currently   Other Topics Concern  . Not on file   Social History Narrative  . No narrative on file    Objective:  BP 134/66  Pulse 73  Temp 98.2 F (36.8 C) (Oral)  Resp 16  Wt 153 lb 8 oz (69.627 kg)  SpO2 99%  General appearance: alert, cooperative and appears stated age Ears: normal TM's and external ear canals both ears Throat: lips, mucosa, and tongue normal; teeth and gums normal Neck: no adenopathy, no carotid bruit, supple, symmetrical, trachea midline and thyroid not enlarged, symmetric, no tenderness/mass/nodules Back: symmetric, no curvature. ROM normal. No CVA tenderness. Lungs: clear to auscultation bilaterally Heart: regular rate and rhythm, S1, S2 normal, no murmur, click, rub or gallop Abdomen: soft, non-tender; bowel sounds normal; no masses,  no organomegaly Pulses: 2+ and symmetric Skin: Skin color, texture, turgor normal. No rashes or lesions Lymph nodes: Cervical, supraclavicular, and axillary nodes normal.  Assessment and Plan: A total of 45 minutes, more than half of which was spent in counselling and coordination of care,  Was spent with patient   Right wrist pain Acute, with signs and symptoms concerning for fracture  or sprain.  X rays and brace ordered. Ortho follow up TBD  Obesity counseling given ,  Advised to resume low GI diet and start exercise program.   Hyperlipidemia Tolerating medications.,  repeat labs needed.   Tobacco abuse She has reduced consumption but has not quit.  The patient was counseled on the dangers of tobacco use, and was advised to quit.  Reviewed strategies to maximize success, including stress management.  COPD (chronic obstructive pulmonary disease) Secondary to tobacco abuse,  Cannot afford albuterol inhaler.  But Qvar was affordable.  Some wheezing noted today .  Samples of Symbicort given.    Updated Medication List Outpatient Encounter Prescriptions as of 09/27/2012  Medication Sig Dispense Refill  . aspirin 81 MG tablet Take 81 mg by mouth daily.        . beclomethasone (QVAR) 80 MCG/ACT inhaler Inhale 1 puff into the lungs 2 (two) times daily.  1 Inhaler  11  . buPROPion (WELLBUTRIN SR) 150 MG 12 hr tablet TAKE ONE TABLET BY MOUTH TWICE DAILY  60 tablet  3  . Cholecalciferol (VITAMIN D3) 1000 UNITS CAPS Take by mouth.        [name] HYDROcodone-acetaminophen (NORCO/VICODIN) 5-325 MG per tablet Take 1 tablet by mouth every 6 (six) hours as needed (cough ).  60 tablet  0  . lisinopril-hydrochlorothiazide (PRINZIDE,ZESTORETIC) 20-25 MG per tablet TAKE ONE TABLET BY MOUTH EVERY DAY  30 tablet  11  . metoprolol succinate (TOPROL-XL) 25 MG 24 hr tablet TAKE ONE TABLET BY MOUTH EVERY DAY  30 tablet  6  . nabumetone (RELAFEN) 750 MG tablet Take 1 tablet (750 mg total) by mouth 2 (two) times daily.  60 tablet  2  . pravastatin (PRAVACHOL) 20 MG tablet Take 1 tablet (20 mg total) by mouth daily.  30 tablet  3  . Red Yeast Rice Extract (RED YEAST RICE PO) Take by mouth.        . vitamin B-12 (CYANOCOBALAMIN) 500 MCG tablet Take 500 mcg by mouth daily.        . vitamin E 100 UNIT capsule Take 100 Units by mouth daily.      . [DISCONTINUED] HYDROcodone-acetaminophen (NORCO) 5-325 MG  per tablet Take 1 tablet by mouth every 6 (six) hours as needed (cough ).  60 tablet  0  . albuterol (PROVENTIL HFA;VENTOLIN HFA) 108 (90 BASE) MCG/ACT inhaler Inhale 2 puffs into the lungs every 6 (six) hours as needed for wheezing.  1 Inhaler  1  . celecoxib (CELEBREX) 200 MG capsule Take 1 capsule (200 mg total) by mouth 2 (two) times daily.  14 capsule  0  .  potassium chloride (KLOR-CON) 20 MEQ packet Take 20 mEq by mouth daily. With food for 7 days.  7 tablet  0  . [DISCONTINUED] azithromycin (ZITHROMAX) 250 MG tablet Take 2 tablets x 1 day the one tablet q day for the next 4 days.  6 tablet  0

## 2012-09-27 NOTE — Patient Instructions (Addendum)
Return for fasting labs next week (call us to schedule a lab appt) . Please  drink  Some water before hand.   You should be using your QVar (beclomethasone) inhaler twice daily  every day to prevent COPD flares.  You can use the symbicort  2 puffs twice daily instead of the Qvar when you have a COPD flare until you can be seen    Regarding your wrist:    X rays of your wrist and forearm to rule out fracture   Celebrex(samples )  twice daily for inflammation and pain for one week,  Then switch to aleve. Twice daily   vicodin as needed for pain   Wear the splint I gave you instead of your other one.,  Itw ill support your wrist

## 2012-09-28 ENCOUNTER — Telehealth: Payer: Self-pay | Admitting: Internal Medicine

## 2012-09-28 ENCOUNTER — Encounter: Payer: Self-pay | Admitting: Internal Medicine

## 2012-09-28 DIAGNOSIS — S63501A Unspecified sprain of right wrist, initial encounter: Secondary | ICD-10-CM

## 2012-09-28 DIAGNOSIS — M25531 Pain in right wrist: Secondary | ICD-10-CM | POA: Insufficient documentation

## 2012-09-28 NOTE — Assessment & Plan Note (Addendum)
counseling given ,  Advised to resume low GI diet and start exercise program.

## 2012-09-28 NOTE — Telephone Encounter (Signed)
xrays did not show any fractures.  She needs to wear the brace until  orthopedics can see her,  Ortho referral in process

## 2012-09-28 NOTE — Assessment & Plan Note (Signed)
Secondary to tobacco abuse,  Cannot afford albuterol inhaler.  But Qvar was affordable.  Some wheezing noted today .  Samples of Symbicort given.

## 2012-09-28 NOTE — Assessment & Plan Note (Signed)
Tolerating medications.,  repeat labs needed.

## 2012-09-28 NOTE — Assessment & Plan Note (Signed)
She has reduced consumption but has not quit.  The patient was counseled on the dangers of tobacco use, and was advised to quit.  Reviewed strategies to maximize success, including stress management.

## 2012-09-28 NOTE — Telephone Encounter (Signed)
See unrouted message  

## 2012-09-28 NOTE — Telephone Encounter (Signed)
Done

## 2012-09-28 NOTE — Assessment & Plan Note (Signed)
Acute, with signs and symptoms concerning for fracture or sprain.  X rays and brace ordered. Ortho follow up TBD

## 2012-10-15 ENCOUNTER — Encounter: Payer: Self-pay | Admitting: Internal Medicine

## 2012-11-01 ENCOUNTER — Ambulatory Visit: Payer: Self-pay | Admitting: Urology

## 2012-12-09 ENCOUNTER — Other Ambulatory Visit: Payer: Self-pay | Admitting: Internal Medicine

## 2012-12-10 NOTE — Telephone Encounter (Signed)
Received refill request electronically. Last office visit 09/27/12. Is it okay to refill medication?

## 2012-12-14 ENCOUNTER — Other Ambulatory Visit: Payer: Self-pay | Admitting: Internal Medicine

## 2013-05-07 ENCOUNTER — Ambulatory Visit (INDEPENDENT_AMBULATORY_CARE_PROVIDER_SITE_OTHER): Payer: BC Managed Care – PPO | Admitting: Adult Health

## 2013-05-07 ENCOUNTER — Encounter: Payer: Self-pay | Admitting: Adult Health

## 2013-05-07 ENCOUNTER — Ambulatory Visit: Payer: Self-pay | Admitting: Adult Health

## 2013-05-07 ENCOUNTER — Telehealth: Payer: Self-pay | Admitting: *Deleted

## 2013-05-07 VITALS — BP 180/78 | HR 87 | Temp 98.3°F | Resp 12 | Ht 58.5 in | Wt 156.0 lb

## 2013-05-07 DIAGNOSIS — M79672 Pain in left foot: Secondary | ICD-10-CM | POA: Insufficient documentation

## 2013-05-07 DIAGNOSIS — R52 Pain, unspecified: Secondary | ICD-10-CM

## 2013-05-07 DIAGNOSIS — M79609 Pain in unspecified limb: Secondary | ICD-10-CM

## 2013-05-07 DIAGNOSIS — J329 Chronic sinusitis, unspecified: Secondary | ICD-10-CM | POA: Insufficient documentation

## 2013-05-07 MED ORDER — AZITHROMYCIN 250 MG PO TABS: ORAL_TABLET | ORAL | Status: DC

## 2013-05-07 NOTE — Assessment & Plan Note (Signed)
Send for xray

## 2013-05-07 NOTE — Progress Notes (Signed)
  Subjective:    Patient ID: Patricia Fields, female    DOB: 08/09/1956, 57 y.o.   MRN: [id number]  HPI  Patient presents clinic with the following concerns:  Pain in left foot. She does not recall any injury to the area. She believes her left foot is broken. Pain ongoing for 2 weeks. She does not recall twisting her ankle or rolling her foot.   She also reports 2 day pressure in sinuses and congestion. She works at [employer] and around [employer] so she knows she is constantly exposed to illness. She reports feeling chilled and she stayed in bed all day yesterday. She reports yellow, greenish drainage from her nose. She has not taken any OTC medication but did drink "white liquor" which made her feel better. Denies shortness of breath or chest pain.   Current Outpatient Prescriptions on File Prior to Visit  Medication Sig Dispense Refill  . albuterol (PROVENTIL HFA;VENTOLIN HFA) 108 (90 BASE) MCG/ACT inhaler Inhale 2 puffs into the lungs every 6 (six) hours as needed for wheezing.  1 Inhaler  1  . aspirin 81 MG tablet Take 81 mg by mouth daily.        . beclomethasone (QVAR) 80 MCG/ACT inhaler Inhale 1 puff into the lungs 2 (two) times daily.  1 Inhaler  11  . buPROPion (WELLBUTRIN SR) 150 MG 12 hr tablet TAKE ONE TABLET BY MOUTH TWICE DAILY  60 tablet  3  . Cholecalciferol (VITAMIN D3) 1000 UNITS CAPS Take by mouth.        [name] lisinopril-hydrochlorothiazide (PRINZIDE,ZESTORETIC) 20-25 MG per tablet TAKE ONE TABLET BY MOUTH EVERY DAY  30 tablet  11  . metoprolol succinate (TOPROL-XL) 25 MG 24 hr tablet TAKE ONE TABLET BY MOUTH EVERY DAY  30 tablet  3  . Red Yeast Rice Extract (RED YEAST RICE PO) Take by mouth.        . vitamin B-12 (CYANOCOBALAMIN) 500 MCG tablet Take 500 mcg by mouth daily.        . vitamin E 100 UNIT capsule Take 100 Units by mouth daily.       No current facility-administered medications on file prior to visit.     Review of Systems  Constitutional: Positive for chills.  Negative for fever.       Stayed in bed all day yesterday  HENT: Positive for congestion, sore throat, postnasal drip and sinus pressure.   Respiratory: Positive for cough. Negative for shortness of breath and wheezing.   Cardiovascular: Negative.   Gastrointestinal: Negative.     BP 180/78  Pulse 87  Temp(Src) 98.3 F (36.8 C) (Oral)  Resp 12  Ht 4' 10.5" (1.486 m)  Wt 156 lb (70.761 kg)  BMI 32.04 kg/m2  SpO2 97%    Objective:   Physical Exam  Constitutional: She is oriented to person, place, and time.  Cardiovascular: Normal rate, regular rhythm and normal heart sounds.  Exam reveals no gallop.   No murmur heard. Pulmonary/Chest: Effort normal and breath sounds normal. No respiratory distress. She has no wheezes. She has no rales.  Abdominal: Soft. Bowel sounds are normal.  Lymphadenopathy:    She has no cervical adenopathy.  Neurological: She is alert and oriented to person, place, and time.  Psychiatric: She has a normal mood and affect. Her behavior is normal. Thought content normal.       Assessment & Plan:

## 2013-05-07 NOTE — Telephone Encounter (Signed)
Notified pt of left foot xray: No fracture. Pt declined referral to podiatry at this time. Advised to call back with persistence or worsening of symptoms.

## 2013-05-07 NOTE — Assessment & Plan Note (Signed)
Start Azithromycin. Fluids to stay hydrated. RTC if symptoms not improved within 3-4 days.

## 2013-05-07 NOTE — Progress Notes (Signed)
  Subjective:    Patient ID: Patricia Fields, female    DOB: 11/23/1955, 57 y.o.   MRN: [id number]  HPI    Review of Systems     Objective:   Physical Exam        Assessment & Plan:

## 2013-05-17 ENCOUNTER — Encounter: Payer: Self-pay | Admitting: Internal Medicine

## 2013-05-29 ENCOUNTER — Ambulatory Visit (INDEPENDENT_AMBULATORY_CARE_PROVIDER_SITE_OTHER): Payer: BC Managed Care – PPO | Admitting: Internal Medicine

## 2013-05-29 ENCOUNTER — Encounter: Payer: Self-pay | Admitting: Internal Medicine

## 2013-05-29 VITALS — BP 144/84 | HR 74 | Temp 98.4°F | Resp 15 | Ht 58.5 in | Wt 157.0 lb

## 2013-05-29 DIAGNOSIS — F411 Generalized anxiety disorder: Secondary | ICD-10-CM | POA: Insufficient documentation

## 2013-05-29 DIAGNOSIS — R5381 Other malaise: Secondary | ICD-10-CM

## 2013-05-29 DIAGNOSIS — M5412 Radiculopathy, cervical region: Secondary | ICD-10-CM

## 2013-05-29 DIAGNOSIS — Z1159 Encounter for screening for other viral diseases: Secondary | ICD-10-CM

## 2013-05-29 DIAGNOSIS — E669 Obesity, unspecified: Secondary | ICD-10-CM

## 2013-05-29 DIAGNOSIS — I10 Essential (primary) hypertension: Secondary | ICD-10-CM

## 2013-05-29 DIAGNOSIS — M79609 Pain in unspecified limb: Secondary | ICD-10-CM

## 2013-05-29 DIAGNOSIS — M79672 Pain in left foot: Secondary | ICD-10-CM

## 2013-05-29 DIAGNOSIS — M25551 Pain in right hip: Secondary | ICD-10-CM

## 2013-05-29 DIAGNOSIS — Z23 Encounter for immunization: Secondary | ICD-10-CM

## 2013-05-29 DIAGNOSIS — R5383 Other fatigue: Secondary | ICD-10-CM

## 2013-05-29 DIAGNOSIS — Z79899 Other long term (current) drug therapy: Secondary | ICD-10-CM

## 2013-05-29 DIAGNOSIS — M25559 Pain in unspecified hip: Secondary | ICD-10-CM

## 2013-05-29 DIAGNOSIS — I739 Peripheral vascular disease, unspecified: Secondary | ICD-10-CM

## 2013-05-29 DIAGNOSIS — L02429 Furuncle of limb, unspecified: Secondary | ICD-10-CM

## 2013-05-29 DIAGNOSIS — E785 Hyperlipidemia, unspecified: Secondary | ICD-10-CM

## 2013-05-29 DIAGNOSIS — F17200 Nicotine dependence, unspecified, uncomplicated: Secondary | ICD-10-CM

## 2013-05-29 DIAGNOSIS — Z1239 Encounter for other screening for malignant neoplasm of breast: Secondary | ICD-10-CM

## 2013-05-29 DIAGNOSIS — Z720 Tobacco use: Secondary | ICD-10-CM

## 2013-05-29 DIAGNOSIS — M5010 Cervical disc disorder with radiculopathy, unspecified cervical region: Secondary | ICD-10-CM

## 2013-05-29 DIAGNOSIS — E559 Vitamin D deficiency, unspecified: Secondary | ICD-10-CM

## 2013-05-29 LAB — CBC WITH DIFFERENTIAL/PLATELET
Basophils Absolute: 0.1 10*3/uL (ref 0.0–0.1)
Basophils Relative: 0.9 % (ref 0.0–3.0)
Eosinophils Absolute: 0.2 10*3/uL (ref 0.0–0.7)
Eosinophils Relative: 2.1 % (ref 0.0–5.0)
HCT: 45 % (ref 36.0–46.0)
Hemoglobin: 15.3 g/dL — ABNORMAL HIGH (ref 12.0–15.0)
Lymphocytes Relative: 31.7 % (ref 12.0–46.0)
Lymphs Abs: 3.4 10*3/uL (ref 0.7–4.0)
MCHC: 34.1 g/dL (ref 30.0–36.0)
MCV: 94 fl (ref 78.0–100.0)
Monocytes Absolute: 0.6 10*3/uL (ref 0.1–1.0)
Monocytes Relative: 5.2 % (ref 3.0–12.0)
Neutro Abs: 6.5 10*3/uL (ref 1.4–7.7)
Neutrophils Relative %: 60.1 % (ref 43.0–77.0)
Platelets: 271 10*3/uL (ref 150.0–400.0)
RBC: 4.79 Mil/uL (ref 3.87–5.11)
RDW: 13.7 % (ref 11.5–14.6)
WBC: 10.7 10*3/uL — ABNORMAL HIGH (ref 4.5–10.5)

## 2013-05-29 MED ORDER — LISINOPRIL-HYDROCHLOROTHIAZIDE 20-25 MG PO TABS: ORAL_TABLET | ORAL | Status: DC

## 2013-05-29 MED ORDER — TRAMADOL HCL 50 MG PO TABS: 50.0000 mg | ORAL_TABLET | Freq: Three times a day (TID) | ORAL | Status: DC | PRN

## 2013-05-29 MED ORDER — MELOXICAM 15 MG PO TABS: 15.0000 mg | ORAL_TABLET | Freq: Every day | ORAL | Status: DC

## 2013-05-29 MED ORDER — SULFAMETHOXAZOLE-TMP DS 800-160 MG PO TABS: 1.0000 | ORAL_TABLET | Freq: Two times a day (BID) | ORAL | Status: DC

## 2013-05-29 MED ORDER — METOPROLOL SUCCINATE ER 25 MG PO TB24: ORAL_TABLET | ORAL | Status: DC

## 2013-05-29 MED ORDER — PAROXETINE HCL 10 MG PO TABS: 10.0000 mg | ORAL_TABLET | ORAL | Status: DC

## 2013-05-29 NOTE — Assessment & Plan Note (Addendum)
Not fluctuant or spontaneously draining. Empiric abx for coverage of MRSA with Septra DS ,  Warm compresses,  Antibacterial soap

## 2013-05-29 NOTE — Assessment & Plan Note (Addendum)
Persistent ,  Lateral side  X rays were normal  . Ligament strain suspected.,  meloxicam and tramadol x 2 weeks

## 2013-05-29 NOTE — Patient Instructions (Signed)
You received the flu and pneumonia vaccines today  1) Your foot pain may be due to a ligament strain  I am prescribing meloxicam to take once daily instread of motrin or aleve You can use the tramadol along with it every 8 hours as needed for pain  Call for referral to podiatrist if no improvement in 2 weeks   2) Your "boil" should resolve with a week of Septrea DS, an antibiotic .  Take it twice daily with food. Try switching to antibacterial soalp (Dial or Lever 2000)  3) I am giving you a trial of Paxil for  Your anxiety ,  Start with 1/2 tablet daily for the first few days,k and if tolerated, increase to full tablet daily    Mammogram is due.  Amber will set up today

## 2013-05-29 NOTE — Assessment & Plan Note (Signed)
annula mammogram was scheduled today

## 2013-05-29 NOTE — Progress Notes (Signed)
Patient ID: Patricia Fields, female   DOB: 1955-09-08, 57 y.o.   MRN: [id number]   Patient Active Problem List   Diagnosis Date Noted  . Generalized anxiety disorder 05/29/2013  . Boil, axilla 05/29/2013  . Left foot pain 05/07/2013  . Sinusitis 05/07/2013  . Right wrist pain 09/28/2012  . Obesity 03/19/2012  . Right hip pain 03/19/2012  . Breast screening, unspecified 03/19/2012  . Screening for cervical cancer 03/19/2012  . History of nephrolithiasis   . Interstitial cystitis   . COPD (chronic obstructive pulmonary disease) 10/16/2011  . Peripheral arterial disease   . Hypertension   . Hyperlipidemia   . 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 Tobacco abuse 07/25/2011    Subjective:  CC:   No chief complaint on file.   HPI:   Patricia Tawney Smithis a 57 y.o. female who presents for follow up on chronic issues of hypertension, hyperlipidemia, recurrent headaches secondary to cervical spondylosis, and PAD.  For the past week she has had a small boil under her right axilla in the area that she shaves. It has been red and tender but has not grown larger or spontaneously drained.   She was ttreated with azithromycin less than a month ago by RR for sinusitis,  And her foot pain was evaluated with x rays which were negative for fractures.  Podiatry referral was declined.  Last seen by me in February.  Has gained 4 lbs since February .   Past Medical History  Diagnosis Date  . Peripheral arterial disease Feb 2011    by renal artery duplex  . Hypertension   . Hyperlipidemia   . 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 History of nephrolithiasis   . Interstitial cystitis   . Tobacco abuse     one pack daily    Past Surgical History  Procedure Laterality Date  . Total abdominal hysterectomy w/ bilateral salpingoophorectomy  1992    due to uterine tumor, and endometriosis   . Arthroscopy      left knee , remote  . Breast surgery     left breast cyst, benign  . Carpal tunnel release      Dr. Hyacinth Meeker,  right hand   . Abdominal hysterectomy         The following portions of the patient's history were reviewed and updated as appropriate: Allergies, current medications, and problem list.    Review of Systems:   12 Pt  review of systems was negative except those addressed in the HPI,     History   Social History  . Marital Status: Married    Spouse Name: N/A    Number of Children: N/A  . Years of Education: N/A   Occupational History  . Not on file.   Social History Main Topics  . Smoking status: Current Every Day Smoker -- 1.00 packs/day    Types: Cigarettes  . Smokeless tobacco: Never Used  . Alcohol Use: No  . Drug Use: No  . Sexual Activity: Not Currently   Other Topics Concern  . Not on file   Social History Narrative  . No narrative on file    Objective:  Filed Vitals:   05/29/13 1134  BP: 144/84  Pulse: 74  Temp: 98.4 F (36.9 C)  Resp: 15     General appearance: alert, cooperative and appears stated age Ears: normal TM's and external ear canals both ears Throat: lips, mucosa, and  tongue normal; teeth and gums normal Neck: no adenopathy, no carotid bruit, supple, symmetrical, trachea midline and thyroid not enlarged, symmetric, no tenderness/mass/nodules Back: symmetric, no curvature. ROM normal. No CVA tenderness. Lungs: clear to auscultation bilaterally Heart: regular rate and rhythm, S1, S2 normal, no murmur, click, rub or gallop Abdomen: soft, non-tender; bowel sounds normal; no masses,  no organomegaly Pulses: 2+ and symmetric Skin: Skin color, texture, turgor normal. No rashes or lesions Lymph nodes: Cervical, supraclavicular, and axillary nodes normal.  Assessment and Plan:  Left foot pain Persistent ,  Lateral side  X rays were normal  . Ligament strain suspected.,  meloxicam and tramadol x 2 weeks   Tobacco abuse Discussed need to quit today,.  cites anxiety as  an obstacle. No improvement with welbutrin  She is willing to try adding an SSRI to help manage her anxiety .  Paxil initiated.   Boil, axilla Not fluctuant or spontaneously draining. Empiric abx for coverage of MRSA with Septra DS ,  Warm compresses,  Antibacterial soap  Breast screening, unspecified annula mammogram was scheduled today   Hyperlipidemia Not controlled on RYR.  With LDL of 189 , hisory of hypertension and tobacco abuse, her 10 yr CAD risk per the Framingham calculation is 27%.  Advising her to start lipitor   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ervical region Symptoms are currently under control with daily meloxicam and prn tramadol.     Right hip pain Managed with meloxicam and tramadol  Obesity I have addressed  BMI and recommended wt loss of 10% of body weigh over the next 6 months using a low glycemic index diet and regular exercise a minimum of 5 days per week.    Hypertension Borderline elevated,reanl function unchanged. No changes today   A total of 40 minutes was spent with patient more than half of which was spent in counseling, reviewing records from other providers and coordination of care. Updated Medication List Outpatient Encounter Prescriptions as of 05/29/2013  Medication Sig Dispense Refill  . albuterol (PROVENTIL HFA;VENTOLIN HFA) 108 (90 BASE) MCG/ACT inhaler Inhale 2 puffs into the lungs every 6 (six) hours as needed for wheezing.  1 Inhaler  1  . aspirin 81 MG tablet Take 81 mg by mouth daily.        . beclomethasone (QVAR) 80 MCG/ACT inhaler Inhale 1 puff into the lungs 2 (two) times daily.  1 Inhaler  11  . buPROPion (WELLBUTRIN SR) 150 MG 12 hr tablet TAKE ONE TABLET BY MOUTH TWICE DAILY  60 tablet  3  . Cholecalciferol (VITAMIN D3) 1000 UNITS CAPS Take by mouth.        [name] lisinopril-hydrochlorothiazide (PRINZIDE,ZESTORETIC) 20-25 MG per tablet TAKE ONE TABLET BY MOUTH EVERY DAY  90 tablet  3  . metoprolol succinate (TOPROL-XL) 25  MG 24 hr tablet TAKE ONE TABLET BY MOUTH EVERY DAY  90 tablet  3  . vitamin B-12 (CYANOCOBALAMIN) 500 MCG tablet Take 500 mcg by mouth daily.        . vitamin E 100 UNIT capsule Take 100 Units by mouth daily.      . [DISCONTINUED] lisinopril-hydrochlorothiazide (PRINZIDE,ZESTORETIC) 20-25 MG per tablet TAKE ONE TABLET BY MOUTH EVERY DAY  30 tablet  11  . [DISCONTINUED] lisinopril-hydrochlorothiazide (PRINZIDE,ZESTORETIC) 20-25 MG per tablet TAKE ONE TABLET BY MOUTH EVERY DAY  30 tablet  11  . [DISCONTINUED] metoprolol succinate (TOPROL-XL) 25 MG 24 hr tablet TAKE ONE TABLET BY MOUTH EVERY DAY  30 tablet  3  . [  DISCONTINUED] Red Yeast Rice Extract (RED YEAST RICE PO) Take by mouth.        [name] atorvastatin (LIPITOR) 20 MG tablet Take 1 tablet (20 mg total) by mouth daily.  90 tablet  3  . meloxicam (MOBIC) 15 MG tablet Take 1 tablet (15 mg total) by mouth daily.  30 tablet  0  . PARoxetine (PAXIL) 10 MG tablet Take 1 tablet (10 mg total) by mouth every morning.  90 tablet  1  . sulfamethoxazole-trimethoprim (BACTRIM DS) 800-160 MG per tablet Take 1 tablet by mouth 2 (two) times daily.  14 tablet  0  . traMADol (ULTRAM) 50 MG tablet Take 1 tablet (50 mg total) by mouth every 8 (eight) hours as needed for pain.  30 tablet  0  . [DISCONTINUED] azithromycin (ZITHROMAX) 250 MG tablet Take 2 tablets today then take 1 tablet daily for the next 4 days.  6 tablet  0   No facility-administered encounter medications on file as of 05/29/2013.

## 2013-05-29 NOTE — Assessment & Plan Note (Addendum)
Discussed need to quit today,.  cites anxiety as an obstacle. No improvement with welbutrin  She is willing to try adding an SSRI to help manage her anxiety .  Paxil initiated.

## 2013-05-30 LAB — COMPREHENSIVE METABOLIC PANEL
ALT: 17 U/L (ref 0–35)
AST: 13 U/L (ref 0–37)
Albumin: 4.3 g/dL (ref 3.5–5.2)
Alkaline Phosphatase: 114 U/L (ref 39–117)
BUN: 14 mg/dL (ref 6–23)
Glucose, Bld: 84 mg/dL (ref 70–99)
Sodium: 142 mEq/L (ref 135–145)
Total Bilirubin: 0.5 mg/dL (ref 0.3–1.2)
Total Protein: 7.3 g/dL (ref 6.0–8.3)

## 2013-05-30 LAB — LIPID PANEL
HDL: 47.7 mg/dL (ref >39.00)
Total CHOL/HDL Ratio: 6

## 2013-05-30 LAB — LDL CHOLESTEROL, DIRECT: Direct LDL: 188.9 mg/dL

## 2013-05-30 MED ORDER — ATORVASTATIN CALCIUM 20 MG PO TABS: 20.0000 mg | ORAL_TABLET | Freq: Every day | ORAL | Status: DC

## 2013-05-30 NOTE — Assessment & Plan Note (Addendum)
Not controlled on RYR.  With LDL of 189 , hisory of hypertension and tobacco abuse, her 10 yr CAD risk per the Framingham calculation is 27%.  Advising her to start lipitor

## 2013-06-01 ENCOUNTER — Encounter: Payer: Self-pay | Admitting: Internal Medicine

## 2013-06-01 NOTE — Assessment & Plan Note (Signed)
Managed with meloxicam and tramadol

## 2013-06-01 NOTE — Assessment & Plan Note (Signed)
Borderline elevated,reanl function unchanged. No changes today

## 2013-06-01 NOTE — Assessment & Plan Note (Signed)
I have addressed  BMI and recommended wt loss of 10% of body weigh over the next 6 months using a low glycemic index diet and regular exercise a minimum of 5 days per week.   

## 2013-06-01 NOTE — Assessment & Plan Note (Signed)
Symptoms are currently under control with daily meloxicam and prn tramadol.

## 2013-06-03 LAB — HM MAMMOGRAPHY: HM Mammogram: NORMAL

## 2013-06-11 ENCOUNTER — Ambulatory Visit: Payer: Self-pay | Admitting: Internal Medicine

## 2013-07-03 ENCOUNTER — Encounter: Payer: Self-pay | Admitting: Internal Medicine

## 2013-08-29 ENCOUNTER — Telehealth: Payer: Self-pay | Admitting: Internal Medicine

## 2013-08-29 NOTE — Telephone Encounter (Signed)
Patient Information:  Caller Name: Patricia Fields  Phone: [phone]  Patient: Patricia Fields, Patricia Fields  Gender: Female  DOB: 1956-07-14  Age: 58 Years  PCP: Duncan Dullullo, Teresa (Adults only)  Office Follow Up:  Does the office need to follow up with this patient?: Yes  Instructions For The Office: Unable to go to UC d/t cost.  Pt wants to know if she can be worked in for appt today (See RN Note).  Please f/u with pt.  RN Note:  Solomiya developed a left earache and dizziness on 08/28/13.  Afebrile.  Dizziness was intermittent yesterday but is constant today.  No appts in office and unable to go to another location.  Advised UC.  Pt refused and requested appt tomorrow.  No appts available in EPIC for tomorrow.  Symptoms  Reason For Call & Symptoms: Earache and dizziness  Reviewed Health History In EMR: Yes  Reviewed Medications In EMR: Yes  Reviewed Allergies In EMR: Yes  Reviewed Surgeries / Procedures: Yes  Date of Onset of Symptoms: 08/28/2013  Guideline(s) Used:  Earache  Dizziness  Disposition Per Guideline:   Go to Office Now  Reason For Disposition Reached:   Lightheadedness (dizziness) present now, after 2 hours of rest and fluids  Advice Given:  Call Back If:  You become worse.  Patient Will Follow Care Advice:  YES

## 2013-08-29 NOTE — Telephone Encounter (Signed)
Left message for patient to return call to office. 

## 2013-08-29 NOTE — Telephone Encounter (Signed)
Patient returned call and schedule/d appointment

## 2013-08-30 ENCOUNTER — Emergency Department: Payer: Self-pay | Admitting: Emergency Medicine

## 2013-08-30 ENCOUNTER — Ambulatory Visit (INDEPENDENT_AMBULATORY_CARE_PROVIDER_SITE_OTHER): Payer: BC Managed Care – PPO | Admitting: Adult Health

## 2013-08-30 ENCOUNTER — Encounter: Payer: Self-pay | Admitting: Adult Health

## 2013-08-30 ENCOUNTER — Encounter (INDEPENDENT_AMBULATORY_CARE_PROVIDER_SITE_OTHER): Payer: Self-pay

## 2013-08-30 VITALS — BP 150/80 | HR 80 | Temp 98.1°F | Resp 12 | Wt 148.5 lb

## 2013-08-30 DIAGNOSIS — H9202 Otalgia, left ear: Secondary | ICD-10-CM

## 2013-08-30 DIAGNOSIS — H9209 Otalgia, unspecified ear: Secondary | ICD-10-CM

## 2013-08-30 DIAGNOSIS — Z8619 Personal history of other infectious and parasitic diseases: Secondary | ICD-10-CM | POA: Insufficient documentation

## 2013-08-30 NOTE — Progress Notes (Signed)
   Subjective:    Patient ID: Patricia Fields, female    DOB: 30-Apr-1956, 58 y.o.   MRN: [id number]  HPI  Patient is a pleasant 58 year old female who presents to clinic with left ear pain. She reports her pain began on Wednesday. She was also feeling dizzy and has been feeling dizzy since. Denies fever. She has also noticed that the left side of her face has become swollen. She denies any difficulty swallowing, shortness of breath.  Current Outpatient Prescriptions on File Prior to Visit  Medication Sig Dispense Refill  . albuterol (PROVENTIL HFA;VENTOLIN HFA) 108 (90 BASE) MCG/ACT inhaler Inhale 2 puffs into the lungs every 6 (six) hours as needed for wheezing.  1 Inhaler  1  . aspirin 81 MG tablet Take 81 mg by mouth daily.        . beclomethasone (QVAR) 80 MCG/ACT inhaler Inhale 1 puff into the lungs 2 (two) times daily.  1 Inhaler  11  . buPROPion (WELLBUTRIN SR) 150 MG 12 hr tablet TAKE ONE TABLET BY MOUTH TWICE DAILY  60 tablet  3  . Cholecalciferol (VITAMIN D3) 1000 UNITS CAPS Take by mouth.        [name]. lisinopril-hydrochlorothiazide (PRINZIDE,ZESTORETIC) 20-25 MG per tablet TAKE ONE TABLET BY MOUTH EVERY DAY  90 tablet  3  . meloxicam (MOBIC) 15 MG tablet Take 1 tablet (15 mg total) by mouth daily.  30 tablet  0  . metoprolol succinate (TOPROL-XL) 25 MG 24 hr tablet TAKE ONE TABLET BY MOUTH EVERY DAY  90 tablet  3  . PARoxetine (PAXIL) 10 MG tablet Take 1 tablet (10 mg total) by mouth every morning.  90 tablet  1  . traMADol (ULTRAM) 50 MG tablet Take 1 tablet (50 mg total) by mouth every 8 (eight) hours as needed for pain.  30 tablet  0  . vitamin B-12 (CYANOCOBALAMIN) 500 MCG tablet Take 500 mcg by mouth daily.        . vitamin E 100 UNIT capsule Take 100 Units by mouth daily.       No current facility-administered medications on file prior to visit.     Review of Systems  Constitutional: Negative for fever and chills.  HENT: Positive for ear pain, facial swelling and sinus  pressure. Negative for trouble swallowing.   Respiratory: Negative for cough, shortness of breath and wheezing.   Cardiovascular: Negative for chest pain.       Objective:   Physical Exam  Constitutional: She is oriented to person, place, and time.  Acutely ill  HENT:  Right Ear: External ear normal.  Left ear is significantly swollen - blocking visualization of tympanic membrane. Painful tragus and TMJ area along with left side of her face. No pain upon palpation of the mastoid process on the left. The left side of her lip is swollen and erythematous.  Cardiovascular: Normal rate and regular rhythm.  Exam reveals no gallop.   No murmur heard. Pulmonary/Chest: Effort normal and breath sounds normal. No respiratory distress. She has no wheezes. She has no rales.  Abdominal:  Patient began vomiting during clinic  Lymphadenopathy:    She has cervical adenopathy.  Neurological: She is alert and oriented to person, place, and time.  Psychiatric: She has a normal mood and affect. Her behavior is normal. Judgment and thought content normal.          Assessment & Plan:

## 2013-08-30 NOTE — Progress Notes (Signed)
Pre visit review using our clinic review tool, if applicable. No additional management support is needed unless otherwise documented below in the visit note. 

## 2013-08-30 NOTE — Assessment & Plan Note (Addendum)
Left ear pain with significant swelling of left side of her face. Patient began vomiting during clinic. EMS was called for further evaluation [facility] ED - rule out facial cellulitis. Note greater than 30 minutes were spent in face-to-face communication with patient. Her PCP was consulted. [facility] emergency room contacted.

## 2013-09-02 ENCOUNTER — Encounter: Payer: Self-pay | Admitting: Internal Medicine

## 2013-09-02 ENCOUNTER — Telehealth: Payer: Self-pay | Admitting: Internal Medicine

## 2013-09-02 ENCOUNTER — Ambulatory Visit (INDEPENDENT_AMBULATORY_CARE_PROVIDER_SITE_OTHER): Payer: BC Managed Care – PPO | Admitting: Internal Medicine

## 2013-09-02 VITALS — BP 106/70 | HR 83 | Temp 97.5°F | Resp 16 | Wt 148.5 lb

## 2013-09-02 DIAGNOSIS — R112 Nausea with vomiting, unspecified: Secondary | ICD-10-CM

## 2013-09-02 DIAGNOSIS — B029 Zoster without complications: Secondary | ICD-10-CM

## 2013-09-02 MED ORDER — ONDANSETRON HCL 8 MG PO TABS: 8.0000 mg | ORAL_TABLET | Freq: Three times a day (TID) | ORAL | Status: DC | PRN

## 2013-09-02 MED ORDER — PROMETHAZINE HCL 25 MG PO TABS: 25.0000 mg | ORAL_TABLET | Freq: Three times a day (TID) | ORAL | Status: DC | PRN

## 2013-09-02 MED ORDER — MECLIZINE HCL 25 MG PO CHEW: 1.0000 | CHEWABLE_TABLET | Freq: Three times a day (TID) | ORAL | Status: DC

## 2013-09-02 MED ORDER — PROMETHAZINE HCL 25 MG/ML IJ SOLN
25.0000 mg | Freq: Once | INTRAMUSCULAR | Status: AC
  Administered 2013-09-02: 25 mg via INTRAVENOUS

## 2013-09-02 NOTE — Progress Notes (Signed)
Pre-visit discussion using our clinic review tool. No additional management support is needed unless otherwise documented below in the visit note.  

## 2013-09-02 NOTE — Telephone Encounter (Signed)
Tried to reach patient left message on mobile voicemail to call office.

## 2013-09-02 NOTE — Telephone Encounter (Signed)
Want patient to see Dr. Darrick Huntsmanullo, not able to keep anything down, on stomach patient taking valcylovir for shingles of the ear but states she cannot keep food or water down. Scheduled to see you at 6.30 today, please advise patient states if she takes medication she will not be able to keep it down,please advise.

## 2013-09-02 NOTE — Patient Instructions (Addendum)
You can use the meclizine for nausea and vertigo  The promethazine (phenergan ) is very sedating but will also work for nausea  The zofran is a third more expensive choice for nausea and is not sedating   YOU MUST take the acyclovir!

## 2013-09-02 NOTE — Telephone Encounter (Signed)
I do not have an appt sooner than 6:30.  Was she given a prescription for phenergan ?  For nausea?  If not please call in phemergan per chart . Have her take it 30 minutes prior to the valcylovir

## 2013-09-02 NOTE — Telephone Encounter (Signed)
The patient was sent to the ER on Friday from this office and she is wanting to be seen at a sooner time today. She is in a 6:30 slot today. Please advise.

## 2013-09-02 NOTE — Progress Notes (Signed)
Patient ID: Patricia Fields, female   DOB: 06-10-1956, 57 y.o.   MRN: [id number] Patient Active Problem List   Diagnosis Date Noted  . Shingles outbreak 08/30/2013  . Generalized anxiety disorder 05/29/2013  . Left foot pain 05/07/2013  . Sinusitis 05/07/2013  . Right wrist pain 09/28/2012  . Obesity 03/19/2012  . Right hip pain 03/19/2012  . Breast screening, unspecified 03/19/2012  . Screening for cervical cancer 03/19/2012  . History of nephrolithiasis   . Interstitial cystitis   . COPD (chronic obstructive pulmonary disease) 10/16/2011  . Peripheral arterial disease   . Hypertension   . Hyperlipidemia   . 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 Tobacco abuse 07/25/2011    Subjective:  CC:   Chief Complaint  Patient presents with  . Acute Visit    HPI:   Patricia Mohamad Smithis a 58 y.o. female who presents for ER follow up.  Patient was evaluated by RaquelRay on Friday afternoon for 2 day history of left sided ear pain and swelling accompanied by redness and swelling of the left side of her face and recurrent vertigo.. During the visit she started vomiting  was sent to th ER for concerns of facial cellulitis. She was apparently treated for herpes/varicella zoster with acyclovir and sent home with a work note. She returns today with persistent dizziness and nausea and has been unable to take the acyclovir due to recurrent vomiting. Her last dose was 24 hours ago. Her face is now covered in erythematous macules that started off as blisters at some point several days ago.  She is accompanied by her sister who has driven her here today. Sent to ER on Friday for facial swelllign, otitis and vomiting.  Treated for herpes zoster with acyclovir but has been unable to take the acyclovir without vomiting.,      Past Medical History  Diagnosis Date  . Peripheral arterial disease Feb 2011    by renal artery duplex  . Hypertension   . Hyperlipidemia   . Cervical disc  disorder with radiculopathy of cervical region   . History of nephrolithiasis   . Interstitial cystitis   . Tobacco abuse     one pack daily    Past Surgical History  Procedure Laterality Date  . Total abdominal hysterectomy w/ bilateral salpingoophorectomy  1992    due to uterine tumor, and endometriosis   . Arthroscopy      left knee , remote  . Breast surgery      left breast cyst, benign  . Carpal tunnel release      Dr. Hyacinth Meeker,  right hand   . Abdominal hysterectomy         The following portions of the patient's history were reviewed and updated as appropriate: Allergies, current medications, and problem list.    Review of Systems:   Patient denies headache, fevers, malaise, unintentional weight loss, eye pain, sinus congestion and sinus pain, sore throat, dysphagia,  hemoptysis , cough, dyspnea, wheezing, chest pain, palpitations, orthopnea, edema, abdominal pain,melena, diarrhea, constipation, flank pain, dysuria, hematuria, urinary  Frequency, nocturia, numbness, tingling, seizures,  Focal weakness, Loss of consciousness,  Tremor, insomnia, depression, anxiety, and suicidal ideation.    History   Social History  . Marital Status: Married    Spouse Name: N/A    Number of Children: N/A  . Years of Education: N/A   Occupational History  . Not on file.   Social History Main Topics  .  Smoking status: Current Every Day Smoker -- 0.50 packs/day    Types: Cigarettes  . Smokeless tobacco: Never Used  . Alcohol Use: No  . Drug Use: No  . Sexual Activity: Not Currently   Other Topics Concern  . Not on file   Social History Narrative  . No narrative on file    Objective:  Filed Vitals:   09/02/13 1825  BP: 106/70  Pulse: 83  Temp: 97.5 F (36.4 C)  Resp: 16     General appearance: alert, cooperative and appears stated age Ears: left tragus and pinna less swollen,  TM still not visible, left side of face has a herpetiform rash from temple to lower  lip. Throat: lips, mucosa, and tongue normal; teeth and gums normal Neck: no adenopathy, no carotid bruit, supple, symmetrical, trachea midline and thyroid not enlarged, symmetric, no tenderness/mass/nodules Back: symmetric, no curvature. ROM normal. No CVA tenderness. Lungs: clear to auscultation bilaterally Heart: regular rate and rhythm, S1, S2 normal, no murmur, click, rub or gallop Abdomen: soft, non-tender; bowel sounds normal; no masses,  no organomegaly Pulses: 2+ and symmetric Skin: Skin color, texture, turgor normal. No rashes or lesions Lymph nodes: Cervical, supraclavicular, and axillary nodes normal.  Assessment and Plan:  Shingles outbreak Involving the facial nerve and inner ear. This is her second shingles outbreak. She as not had the Zostavax vaccine and is now willing to get it. Unfortunately she will have to wait 6 months. I've given her prescription for Phenergan and meclizine and Zofran so that she can treat the nausea and continue/resume the acyclovir. Due to her persistent dizziness she is unable to return to work. I have given her a work note to stay out for another week.  A total of 25 minutes was spent with patient more than half of which was spent in counseling, reviewing records from other prviders and coordination of care.  Updated Medication List Outpatient Encounter Prescriptions as of 09/02/2013  Medication Sig  . acyclovir (ZOVIRAX) 200 MG capsule Take 200 mg by mouth 5 (five) times daily.  [name]. albuterol (PROVENTIL HFA;VENTOLIN HFA) 108 (90 BASE) MCG/ACT inhaler Inhale 2 puffs into the lungs every 6 (six) hours as needed for wheezing.  [name]. aspirin 81 MG tablet Take 81 mg by mouth daily.    . beclomethasone (QVAR) 80 MCG/ACT inhaler Inhale 1 puff into the lungs 2 (two) times daily.  [name]. buPROPion (WELLBUTRIN SR) 150 MG 12 hr tablet TAKE ONE TABLET BY MOUTH TWICE DAILY  . Cholecalciferol (VITAMIN D3) 1000 UNITS CAPS Take by mouth.    [name]. lisinopril-hydrochlorothiazide  (PRINZIDE,ZESTORETIC) 20-25 MG per tablet TAKE ONE TABLET BY MOUTH EVERY DAY  . meloxicam (MOBIC) 15 MG tablet Take 1 tablet (15 mg total) by mouth daily.  . metoprolol succinate (TOPROL-XL) 25 MG 24 hr tablet TAKE ONE TABLET BY MOUTH EVERY DAY  . PARoxetine (PAXIL) 10 MG tablet Take 1 tablet (10 mg total) by mouth every morning.  . promethazine (PHENERGAN) 25 MG tablet Take 1 tablet (25 mg total) by mouth every 8 (eight) hours as needed for nausea or vomiting.  . traMADol (ULTRAM) 50 MG tablet Take 1 tablet (50 mg total) by mouth every 8 (eight) hours as needed for pain.  . vitamin B-12 (CYANOCOBALAMIN) 500 MCG tablet Take 500 mcg by mouth daily.    . vitamin E 100 UNIT capsule Take 100 Units by mouth daily.  . Meclizine HCl 25 MG CHEW Chew 1 tablet (25 mg total) by mouth 3 (three)  times daily before meals. For nausea or vertigo  . ondansetron (ZOFRAN) 8 MG tablet Take 1 tablet (8 mg total) by mouth every 8 (eight) hours as needed for nausea or vomiting.  . [EXPIRED] promethazine (PHENERGAN) injection 25 mg      No orders of the defined types were placed in this encounter.    No Follow-up on file.

## 2013-09-03 ENCOUNTER — Encounter: Payer: Self-pay | Admitting: Internal Medicine

## 2013-09-03 NOTE — Assessment & Plan Note (Signed)
Involving the facial nerve and inner ear. This is her second shingles outbreak. She as not had the Zostavax vaccine and is now willing to get it. Unfortunately she will have to wait 6 months. I've given her prescription for Phenergan and meclizine and Zofran so that she can treat the nausea and continue/resume the acyclovir. Due to her persistent dizziness she is unable to return to work. I have given her a work note to stay out for another week.

## 2013-09-03 NOTE — Telephone Encounter (Signed)
Patient seen in office

## 2013-09-03 NOTE — Addendum Note (Signed)
Addended by: Sherlene ShamsULLO, TERESA L on: 09/03/2013 08:28 AM   Modules accepted: Level of Service

## 2013-09-09 ENCOUNTER — Ambulatory Visit (INDEPENDENT_AMBULATORY_CARE_PROVIDER_SITE_OTHER): Payer: BC Managed Care – PPO | Admitting: Internal Medicine

## 2013-09-09 ENCOUNTER — Encounter: Payer: Self-pay | Admitting: Internal Medicine

## 2013-09-09 VITALS — BP 128/78 | HR 79 | Temp 98.2°F | Resp 16 | Wt 143.0 lb

## 2013-09-09 DIAGNOSIS — B029 Zoster without complications: Secondary | ICD-10-CM

## 2013-09-09 DIAGNOSIS — B0223 Postherpetic polyneuropathy: Secondary | ICD-10-CM

## 2013-09-09 NOTE — Patient Instructions (Addendum)
I am referring you to an Ear nose and throat doctor to look at your left ear   Please ask [facility] to send the forms for your leave of absence, We have not received them  You will need another week of rest

## 2013-09-09 NOTE — Progress Notes (Signed)
Pre-visit discussion using our clinic review tool. No additional management support is needed unless otherwise documented below in the visit note.  

## 2013-09-09 NOTE — Progress Notes (Signed)
Patient ID: Patricia Fields, female   DOB: [birth year], 58 y.o.   MRN: [id number]  Patient Active Problem List   Diagnosis Date Noted  . Shingles outbreak 08/30/2013  . Generalized anxiety disorder 05/29/2013  . Left foot pain 05/07/2013  . Sinusitis 05/07/2013  . Right wrist pain 09/28/2012  . Obesity 03/19/2012  . Right hip pain 03/19/2012  . Breast screening, unspecified 03/19/2012  . Screening for cervical cancer 03/19/2012  . History of nephrolithiasis   . Interstitial cystitis   . COPD (chronic obstructive pulmonary disease) 10/16/2011  . Peripheral arterial disease   . Hypertension   . Hyperlipidemia   . 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 Tobacco abuse 07/25/2011    Subjective:  CC:   Chief Complaint  Patient presents with  . Follow-up    HPI:   Patricia Malak Smithis a 58 y.o. female who presents  Follow up on recent painful episode of shingles involving the left facial nerve diagnosed during our ER visit two weeks ago for facial cellulitis without lesions accompanied by vomiting and vertigo.. Patient follows up after one week of treatment with prednisone,  Acyclovir,  Phenergan and meclizine for persistent nausea with vertigo and pain. Her skin lesions have of completely cleared up. However she continues to have persistent nausea and vertigo. She is preferring to use a Finnegan for management of symptoms. She is sleeping a lot. She has lost 5 pounds since her last visit. 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   Past Medical History  Diagnosis Date  . Peripheral arterial disease Feb 2011    by renal artery duplex  . Hypertension   . Hyperlipidemia   . 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 History of nephrolithiasis   . Interstitial cystitis   . Tobacco abuse      one pack daily    Past Surgical History  Procedure Laterality Date  . Total abdominal hysterectomy w/ bilateral salpingoophorectomy  1992    due to uterine tumor, and endometriosis   . Arthroscopy      left knee , remote  . Breast surgery      left breast cyst, benign  . Carpal tunnel release      Dr. Hyacinth Meeker,  right hand   . Abdominal hysterectomy         The following portions of the patient's history were reviewed and updated as appropriate: Allergies, current medications, and problem list.    Review of Systems:   12 Pt  review of systems was negative except those addressed in the HPI,     History   Social History  . Marital Status: Married    Spouse Name: N/A    Number of Children: N/A  . Years of Education: N/A   Occupational History  . Not on file.   Social History Main Topics  . Smoking status: Current Every Day Smoker -- 0.50 packs/day    Types: Cigarettes  . Smokeless tobacco: Never Used  . Alcohol Use: No  . Drug Use: No  . Sexual Activity: Not Currently   Other Topics Concern  . Not on file   Social History Narrative  . No narrative on file    Objective:  Filed Vitals:   09/09/13 1837  BP:  128/78  Pulse: 79  Temp: 98.2 F (36.8 C)  Resp: 16     General appearance: alert, cooperative and appears stated age Ears: left eaqr canal swollen,  TM not visible.  Throat: lips, mucosa, and tongue normal; teeth and gums normal Neck: no adenopathy, no carotid bruit, supple, symmetrical, trachea midline and thyroid not enlarged, symmetric, no tenderness/mass/nodules Back: symmetric, no curvature. ROM normal. No CVA tenderness. Lungs: clear to auscultation bilaterally Heart: regular rate and rhythm, S1, S2 normal, no murmur, click, rub or gallop Abdomen: soft, non-tender; bowel sounds normal; no masses,  no organomegaly Pulses: 2+ and symmetric Skin: Skin color, texture, turgor normal. No rashes or lesions Lymph nodes: Cervical,  supraclavicular, and axillary nodes normal.  Assessment and Plan:  Shingles outbreak She continues to have pain and swelling of the left ear accompanied by vertigo and nausea despite one week of treatment with acyclovir her Phenergan and prednisone.. She's not ready to return to work. I am referring her to EENT for evaluation    Updated Medication List Outpatient Encounter Prescriptions as of 09/09/2013  Medication Sig  . acyclovir (ZOVIRAX) 200 MG capsule Take 200 mg by mouth 5 (five) times daily.  [name]. albuterol (PROVENTIL HFA;VENTOLIN HFA) 108 (90 BASE) MCG/ACT inhaler Inhale 2 puffs into the lungs every 6 (six) hours as needed for wheezing.  [name]. aspirin 81 MG tablet Take 81 mg by mouth daily.    . beclomethasone (QVAR) 80 MCG/ACT inhaler Inhale 1 puff into the lungs 2 (two) times daily.  [name]. buPROPion (WELLBUTRIN SR) 150 MG 12 hr tablet TAKE ONE TABLET BY MOUTH TWICE DAILY  . Cholecalciferol (VITAMIN D3) 1000 UNITS CAPS Take by mouth.    [name]. lisinopril-hydrochlorothiazide (PRINZIDE,ZESTORETIC) 20-25 MG per tablet TAKE ONE TABLET BY MOUTH EVERY DAY  . Meclizine HCl 25 MG CHEW Chew 1 tablet (25 mg total) by mouth 3 (three) times daily before meals. For nausea or vertigo  . meloxicam (MOBIC) 15 MG tablet Take 1 tablet (15 mg total) by mouth daily.  . metoprolol succinate (TOPROL-XL) 25 MG 24 hr tablet TAKE ONE TABLET BY MOUTH EVERY DAY  . ondansetron (ZOFRAN) 8 MG tablet Take 1 tablet (8 mg total) by mouth every 8 (eight) hours as needed for nausea or vomiting.  [name]. PARoxetine (PAXIL) 10 MG tablet Take 1 tablet (10 mg total) by mouth every morning.  . promethazine (PHENERGAN) 25 MG tablet Take 1 tablet (25 mg total) by mouth every 8 (eight) hours as needed for nausea or vomiting.  . traMADol (ULTRAM) 50 MG tablet Take 1 tablet (50 mg total) by mouth every 8 (eight) hours as needed for pain.  . vitamin B-12 (CYANOCOBALAMIN) 500 MCG tablet Take 500 mcg by mouth daily.    . vitamin E 100 UNIT capsule  Take 100 Units by mouth daily.     Orders Placed This Encounter  Procedures  . Ambulatory referral to ENT    No Follow-up on file.

## 2013-09-10 NOTE — Addendum Note (Signed)
Addended by: Geraldin Habermehl L on: 09/10/2013 01:00 PM   Modules accepted: Level of Service  

## 2013-09-10 NOTE — Assessment & Plan Note (Addendum)
She continues to have pain and swelling of the left ear accompanied by vertigo and nausea despite one week of treatment with acyclovir her Phenergan and prednisone.. She's not ready to return to work. I am referring her to EENT for evaluation

## 2013-09-11 ENCOUNTER — Telehealth: Payer: Self-pay | Admitting: Internal Medicine

## 2013-09-11 NOTE — Telephone Encounter (Signed)
Paperwork dropped off regarding disability.  States it should have been faxed to us but she did not think we ever received it.

## 2013-09-12 NOTE — Telephone Encounter (Signed)
Placing in red folder. 

## 2013-09-21 ENCOUNTER — Telehealth: Payer: Self-pay | Admitting: Internal Medicine

## 2013-09-21 NOTE — Telephone Encounter (Signed)
Relevant patient education assigned to patient using Emmi. ° °

## 2013-09-23 ENCOUNTER — Telehealth: Payer: Self-pay | Admitting: *Deleted

## 2013-09-23 NOTE — Telephone Encounter (Signed)
Left detailed message that [facility] was done [facility]. Paper work faxed.

## 2013-10-07 ENCOUNTER — Encounter (INDEPENDENT_AMBULATORY_CARE_PROVIDER_SITE_OTHER): Payer: Self-pay

## 2013-10-07 ENCOUNTER — Ambulatory Visit (INDEPENDENT_AMBULATORY_CARE_PROVIDER_SITE_OTHER): Payer: BC Managed Care – PPO | Admitting: Neurology

## 2013-10-07 ENCOUNTER — Encounter: Payer: Self-pay | Admitting: Neurology

## 2013-10-07 VITALS — BP 118/65 | HR 76 | Temp 96.7°F | Ht 58.5 in | Wt 156.0 lb

## 2013-10-07 DIAGNOSIS — R42 Dizziness and giddiness: Secondary | ICD-10-CM

## 2013-10-07 DIAGNOSIS — B0229 Other postherpetic nervous system involvement: Secondary | ICD-10-CM

## 2013-10-07 DIAGNOSIS — M25551 Pain in right hip: Secondary | ICD-10-CM

## 2013-10-07 DIAGNOSIS — H9202 Otalgia, left ear: Secondary | ICD-10-CM

## 2013-10-07 DIAGNOSIS — L299 Pruritus, unspecified: Secondary | ICD-10-CM

## 2013-10-07 DIAGNOSIS — R209 Unspecified disturbances of skin sensation: Secondary | ICD-10-CM

## 2013-10-07 DIAGNOSIS — H9209 Otalgia, unspecified ear: Secondary | ICD-10-CM

## 2013-10-07 DIAGNOSIS — M25559 Pain in unspecified hip: Secondary | ICD-10-CM

## 2013-10-07 DIAGNOSIS — R202 Paresthesia of skin: Secondary | ICD-10-CM

## 2013-10-07 DIAGNOSIS — F17200 Nicotine dependence, unspecified, uncomplicated: Secondary | ICD-10-CM

## 2013-10-07 MED ORDER — HYDROXYZINE HCL 10 MG PO TABS: 10.0000 mg | ORAL_TABLET | Freq: Two times a day (BID) | ORAL | Status: DC | PRN

## 2013-10-07 MED ORDER — GABAPENTIN 100 MG PO CAPS: ORAL_CAPSULE | ORAL | Status: DC

## 2013-10-07 NOTE — Patient Instructions (Addendum)
I think overall you are doing fairly well but I do want to suggest a few things today:  Remember to drink plenty of fluid, eat healthy meals and do not skip any meals. Try to eat protein with a every meal and eat a healthy snack such as fruit or nuts in between meals. Try to keep a regular sleep-wake schedule and try to exercise daily, particularly in the form of walking, 20-30 minutes a day, if you can.   As far as your medications are concerned, I would like to suggest a trial of Atarax, 10 mg up to three times a day as needed for itching. This can cause mouth dryness and sleepiness. For your residual face, scalp and ear pain, we will try Start Neurontin (gabapentin) 100 mg strength: Take 1 pill nightly at bedtime for 1 week, then 1 pill twice daily for 1 week, then 1 pill 3 times a day thereafter. The most common side effects reported are sedation or sleepiness. Rare side effects include balance problems, confusion.    Please remember, that vertigo can recur without warning. It can last hours or days. Please change positions slowly and always stay well-hydrated. Physical therapy with particular attention to vestibular rehabilitation can be very helpful. While there is no specific medication that helps with vertigo, some people get relief with as needed use of meclizine. Certain medications can exacerbate vertigo. Follow up with Dr. Willeen CassBennett for your vertigo and hearing.   As far as diagnostic testing: no new test needed.   I would like to see you back in 3 months, sooner if we need to. Please call us with any interim questions, concerns, problems, updates or refill requests.  Our nursing staff will answer any of your questions and relay your messages to me and also relay most of my messages to you.  Our phone number is [phone]. We also have an after hours call service for urgent matters and there is a physician on-call for urgent questions. For any emergencies you know to call 911 or go to the  nearest emergency room.

## 2013-10-07 NOTE — Progress Notes (Signed)
Subjective:    Patient ID: Patricia Fields is a 58 y.o. female.  HPI    Huston FoleySaima Shalissa Easterwood, MD, PhD [facility] [address], [address] [address] [place], [place] [address]  Dear Dr. Willeen CassBennett,   I saw your patient, Karl BalesDiane Cappello, upon your kind request in my neurologic clinic today for initial consultation of her postherpetic neuralgic pain. The patient is accompanied by her friend, Mitzi DavenportShelby, today. As you know, 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 She developed hearing loss, vertigo for which he has been seeing you and also residual ear pain. Her hearing loss you felt was not likely to be recovered. She was referred to physical therapy for balance training. She continues to have neuralgic pain as a result of for shingles. She has not yet started her PT, but will go this week. For her pain, she has tried Percocet, which helps, but she residual tingling and itching in her scalp and L mid face and she has tried topical Benadryl, which helps very little, for about 10-15 minutes. She has had difficulty sleeping at night, d/t the tingling, itching and pain.   Her Past Medical History Is Significant For: Past Medical History  Diagnosis Date  . Peripheral arterial disease Feb 2011    by renal artery duplex  . Hypertension   . Hyperlipidemia   . 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 History of nephrolithiasis   . Interstitial cystitis   . Tobacco abuse     one pack daily    Her Past Surgical History Is Significant For: Past Surgical History  Procedure Laterality Date  . Total abdominal hysterectomy w/ bilateral salpingoophorectomy  1992    due to uterine tumor, and endometriosis   .  Arthroscopy      left knee , remote  . Breast surgery      left breast cyst, benign  . Carpal tunnel release      Dr. Hyacinth MeekerMiller,  right hand   . Abdominal hysterectomy      Her Family History Is Significant For: Family History  Problem Relation Age of Onset  . Coronary artery disease Mother   . COPD Mother   . Heart disease Mother     Her Social History Is Significant For: History   Social History  . Marital Status: Married    Spouse Name: N/A    Number of Children: N/A  . Years of Education: N/A   Social History Main Topics  . Smoking status: Current Every Day Smoker -- 0.50 packs/day    Types: Cigarettes  . Smokeless tobacco: Never Used  . Alcohol Use: No  . Drug Use: No  . Sexual Activity: Not Currently   Other Topics Concern  . None   Social History Narrative  . None    Her Allergies Are:  No Known Allergies:   Her Current Medications Are:  Outpatient Encounter Prescriptions as of 10/07/2013  Medication Sig  . acyclovir (ZOVIRAX) 200 MG capsule Take 200 mg by mouth 5 (five) times daily.  [name]. albuterol (PROVENTIL HFA;VENTOLIN HFA) 108 (90 BASE) MCG/ACT inhaler Inhale 2 puffs into the lungs every 6 (six) hours as needed for wheezing.  [name]. aspirin 81 MG tablet Take 81 mg  by mouth daily.    . beclomethasone (QVAR) 80 MCG/ACT inhaler Inhale 1 puff into the lungs 2 (two) times daily.  [name] buPROPion (WELLBUTRIN SR) 150 MG 12 hr tablet TAKE ONE TABLET BY MOUTH TWICE DAILY  . Cholecalciferol (VITAMIN D3) 1000 UNITS CAPS Take by mouth.    [name] HYDROcodone-acetaminophen (NORCO/VICODIN) 5-325 MG per tablet Take 1 tablet by mouth as needed.  [name] lisinopril-hydrochlorothiazide (PRINZIDE,ZESTORETIC) 20-25 MG per tablet TAKE ONE TABLET BY MOUTH EVERY DAY  . Meclizine HCl 25 MG CHEW Chew 1 tablet (25 mg total) by mouth 3 (three) times daily before meals. For nausea or vertigo  . meloxicam (MOBIC) 15 MG tablet Take 1 tablet (15 mg total) by mouth daily.  . metoprolol succinate (TOPROL-XL)  25 MG 24 hr tablet TAKE ONE TABLET BY MOUTH EVERY DAY  . ondansetron (ZOFRAN) 8 MG tablet Take 1 tablet (8 mg total) by mouth every 8 (eight) hours as needed for nausea or vomiting.  [name] PARoxetine (PAXIL) 10 MG tablet Take 1 tablet (10 mg total) by mouth every morning.  . promethazine (PHENERGAN) 25 MG tablet Take 1 tablet (25 mg total) by mouth every 8 (eight) hours as needed for nausea or vomiting.  . traMADol (ULTRAM) 50 MG tablet Take 1 tablet (50 mg total) by mouth every 8 (eight) hours as needed for pain.  . vitamin B-12 (CYANOCOBALAMIN) 500 MCG tablet Take 500 mcg by mouth daily.    . vitamin E 100 UNIT capsule Take 100 Units by mouth daily.   Review of Systems:  Out of a complete 14 point review of systems, all are reviewed and negative with the exception of these symptoms as listed below:   Review of Systems  Constitutional: Negative.   HENT: Positive for hearing loss (left ear).   Eyes: Negative.   Respiratory: Negative.   Cardiovascular: Negative.   Gastrointestinal: Negative.   Endocrine: Negative.   Genitourinary: Negative.   Musculoskeletal: Negative.   Skin: Negative.   Allergic/Immunologic: Negative.   Neurological: Positive for dizziness.  Hematological: Negative.   Psychiatric/Behavioral: Positive for sleep disturbance (insomnia).    Objective:  Neurologic Exam  Physical Exam Physical Examination:   Filed Vitals:   10/07/13 1019  BP: 118/65  Pulse: 76  Temp: 96.7 F (35.9 C)    General Examination: The patient is a very pleasant 58 y.o. female in no acute distress. She appears, deconditioned, and older than stated age. She is adequately groomed.   HEENT: Normocephalic, atraumatic, pupils are equal, round and reactive to light and accommodation. Funduscopic exam is normal with sharp disc margins noted on the L and I cannot fully visualize her R fundus, due to ? Cataract. Extraocular tracking is good without limitation to gaze excursion or nystagmus noted.  Normal smooth pursuit is noted. Hearing is grossly intact on the right and impaired on the left, she reports muffled sounds on the left. Tympanic membranes are clear bilaterally. No rash or vesicles. Face is symmetric with normal facial animation and normal facial sensation. Speech is clear with no dysarthria noted. There is no hypophonia. There is no lip, neck/head, jaw or voice tremor. Neck is supple with full range of passive and active motion. There are no carotid bruits on auscultation. Oropharynx exam reveals: moderate mouth dryness, poor dental hygiene with several missing teeth and mild airway crowding, due to narrow airway entry. Mallampati is class II. Tongue protrudes centrally and palate elevates symmetrically. Tonsils are 1+.   Chest: Clear to auscultation without wheezing, rhonchi  or crackles noted.  Heart: S1+S2+0, regular and normal without murmurs, rubs or gallops noted.   Abdomen: Soft, non-tender and non-distended with normal bowel sounds appreciated on auscultation.  Extremities: There is no pitting edema in the distal lower extremities bilaterally. Pedal pulses are intact.  Skin: Warm and dry without trophic changes noted. There are no varicose veins.  Musculoskeletal: exam reveals no obvious joint deformities, tenderness or joint swelling or erythema.   Neurologically:  Mental status: The patient is awake, alert and oriented in all 4 spheres. Her immediate and remote memory, attention, language skills and fund of knowledge are appropriate. There is no evidence of aphasia, agnosia, apraxia or anomia. Speech is clear with normal prosody and enunciation. Thought process is linear. Mood is normal and affect is normal.  Cranial nerves II - XII are as described above under HEENT exam. In addition: shoulder shrug is normal with equal shoulder height noted. Motor exam: Normal bulk, strength and tone is noted. There is no drift, tremor or rebound. Romberg is negative. Reflexes are 2+ in  the UEs and 3+ in the LEs. Babinski: Toes are flexor bilaterally. Fine motor skills and coordination: intact with normal finger taps, normal hand movements, normal rapid alternating patting, normal foot taps and normal foot agility.  Cerebellar testing: No dysmetria or intention tremor on finger to nose testing. Heel to shin is unremarkable bilaterally, with the exception of right hip pain reported. There is no truncal or gait ataxia.  Sensory exam: intact to light touch, pinprick, vibration, temperature sense and proprioception in the upper and lower extremities.  Gait, station and balance: She stands up slowly. She does have some right hip pain. She does not lean or fear to one side. Posture is age-appropriate but stance is wide-based. She's not able to walk tandem or pull herself up on her toes or heels. She walks slowly and cautiously and turns in 3 steps. Her balance is impaired.  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 Her exam is largely nonfocal with the exception of unsteady and cautious gait, and left-sided hearing loss. She reports intermittent dizziness and vertigo type symptoms for which she is going to start physical therapy soon. for her postherpetic pain and paresthesias as well as itching I suggested the following: Atarax as needed for itching, and a trial of gabapentin starting at 100 mg strength each night with filled up to twice daily, then 3 times a day. I talked her about potential side effects with Atarax including sedation as well as side effects of gabapentin including confusion, sedation, balance  problems. She will followup with you after physical therapy and for her vertigo as well as her hearing loss. I will see her back in about 3 months for recheck and have encouraged her to call with any interim questions, concerns, problems or updates. I talked to her about vertigo and that it can certainly become worse. She is advised to change positions slowly, and stay very well hydrated. She is advised to stop smoking. She has a kidney doctor because she has had a stent placed in her renal artery. Her kidney function  has been preserved. Nevertheless we will advance gabapentin only cautiously.  Thank you very much for allowing me to participate in the care of this nice patient. If I can be of any further assistance to you please do not hesitate to call me at [phone].  Sincerely,   Huston Foley, MD, PhD

## 2013-10-08 ENCOUNTER — Telehealth: Payer: Self-pay | Admitting: Internal Medicine

## 2013-10-08 DIAGNOSIS — H908 Mixed conductive and sensorineural hearing loss, unspecified: Secondary | ICD-10-CM | POA: Insufficient documentation

## 2013-10-08 DIAGNOSIS — H8120 Vestibular neuronitis, unspecified ear: Secondary | ICD-10-CM | POA: Insufficient documentation

## 2013-10-11 ENCOUNTER — Other Ambulatory Visit: Payer: Self-pay | Admitting: Internal Medicine

## 2013-10-25 ENCOUNTER — Encounter: Payer: Self-pay | Admitting: Otolaryngology

## 2013-11-20 ENCOUNTER — Encounter: Payer: Self-pay | Admitting: Otolaryngology

## 2013-12-19 ENCOUNTER — Other Ambulatory Visit: Payer: Self-pay | Admitting: Internal Medicine

## 2013-12-20 ENCOUNTER — Encounter: Payer: Self-pay | Admitting: Otolaryngology

## 2014-01-20 ENCOUNTER — Encounter: Payer: Self-pay | Admitting: Otolaryngology

## 2014-01-21 ENCOUNTER — Other Ambulatory Visit: Payer: Self-pay | Admitting: Neurology

## 2014-02-12 ENCOUNTER — Ambulatory Visit (INDEPENDENT_AMBULATORY_CARE_PROVIDER_SITE_OTHER): Payer: BC Managed Care – PPO | Admitting: Neurology

## 2014-02-12 ENCOUNTER — Telehealth: Payer: Self-pay | Admitting: Neurology

## 2014-02-12 ENCOUNTER — Encounter: Payer: Self-pay | Admitting: Neurology

## 2014-02-12 VITALS — BP 152/79 | HR 78 | Temp 96.6°F | Ht 58.5 in | Wt 159.0 lb

## 2014-02-12 DIAGNOSIS — F17200 Nicotine dependence, unspecified, uncomplicated: Secondary | ICD-10-CM

## 2014-02-12 DIAGNOSIS — R202 Paresthesia of skin: Secondary | ICD-10-CM

## 2014-02-12 DIAGNOSIS — H9209 Otalgia, unspecified ear: Secondary | ICD-10-CM

## 2014-02-12 DIAGNOSIS — L299 Pruritus, unspecified: Secondary | ICD-10-CM

## 2014-02-12 DIAGNOSIS — H9202 Otalgia, left ear: Secondary | ICD-10-CM

## 2014-02-12 DIAGNOSIS — R42 Dizziness and giddiness: Secondary | ICD-10-CM

## 2014-02-12 DIAGNOSIS — B0229 Other postherpetic nervous system involvement: Secondary | ICD-10-CM

## 2014-02-12 DIAGNOSIS — R209 Unspecified disturbances of skin sensation: Secondary | ICD-10-CM

## 2014-02-12 MED ORDER — HYDROXYZINE HCL 10 MG PO TABS: ORAL_TABLET | ORAL | Status: DC

## 2014-02-12 MED ORDER — GABAPENTIN 100 MG PO CAPS: 100.0000 mg | ORAL_CAPSULE | Freq: Every day | ORAL | Status: DC

## 2014-02-12 NOTE — Progress Notes (Signed)
Subjective:    Patient ID: Patricia Fields is a 58 y.o. female.  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 She's accompanied by her friend, Patricia Fields, again today. I first met her on 10/07/2013 at the request of her ENT physician at which time she reported a bout of shingles affecting her left ear on 08/29/13, treated with acyclovir x 10 days, prednisone and floxin ear drops. She developed hearing loss, and vertigo for which she had been seeing ENT and she was told that her hearing loss was likely permanent. She was referred to physical therapy for balance training. She reported neuralgic pain as a result of her shingles. I suggested Atarax as needed for itching and a trial of gabapentin low-dose with slow titration. Today, she reports that while PT was helpful it is currently on hold as she had another ear test with Dr. Reola Mosher office and the results are pending, and she is due for FU tomorrow. She has been doing her PT recommended exercises. While she has had improvement in her L ear pain and itching, she still has itching and to a lesser degree L ear pain. The gabapentin was helpful, but she did not know to refill it, but refilled the Atarax. She has residual dizziness, but not true vertigo, but has associated nausea for which she has phenergan as needed. She had no recent illness or changes to her medication. She still smokes and does not drink enough water. She prefers [place]. She does not drive.   Her Past Medical History Is Significant For: Past Medical History  Diagnosis Date  . Peripheral arterial disease Feb 2011    by renal artery duplex  . Hypertension   . Hyperlipidemia   . 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 History of  nephrolithiasis   . Interstitial cystitis   . Tobacco abuse     one pack daily    Her Past Surgical History Is Significant For: Past Surgical History  Procedure Laterality Date  . Total abdominal hysterectomy w/ bilateral salpingoophorectomy  1992    due to uterine tumor, and endometriosis   . Arthroscopy      left knee , remote  . Breast surgery      left breast cyst, benign  . Carpal tunnel release      Dr. Sabra Heck,  right hand   . Abdominal hysterectomy      Her Family History Is Significant For: Family History  Problem Relation Age of Onset  . Coronary artery disease Mother   . COPD Mother   . Heart disease Mother     Her Social History Is Significant For: History   Social History  . Marital Status: Married    Spouse Name: N/A    Number of Children: N/A  . Years of Education: N/A   Social History Main Topics  . Smoking status: Current Every Day Smoker -- 0.50 packs/day    Types: Cigarettes  . Smokeless tobacco: Never Used  . Alcohol Use: No  . Drug Use: No  . Sexual Activity: Not Currently   Other Topics Concern  . None   Social History Narrative  . None    Her Allergies Are:  No Known Allergies:   Her Current Medications Are:  Outpatient  Encounter Prescriptions as of 02/12/2014  Medication Sig  . acyclovir (ZOVIRAX) 200 MG capsule Take 200 mg by mouth 5 (five) times daily.  [name] albuterol (PROVENTIL HFA;VENTOLIN HFA) 108 (90 BASE) MCG/ACT inhaler Inhale 2 puffs into the lungs every 6 (six) hours as needed for wheezing.  [name] aspirin 81 MG tablet Take 81 mg by mouth daily.    [name] b complex vitamins capsule Take 1 capsule by mouth daily.  . beclomethasone (QVAR) 80 MCG/ACT inhaler Inhale 1 puff into the lungs 2 (two) times daily.  [name] buPROPion (WELLBUTRIN SR) 150 MG 12 hr tablet TAKE ONE TABLET BY MOUTH TWICE DAILY  . Cholecalciferol (VITAMIN D3) 1000 UNITS CAPS Take by mouth.    . gabapentin (NEURONTIN) 100 MG capsule 1 pill nightly at bedtime for 1 week,  then 1 pill twice daily for 1 week, then 1 pill 3 times a day thereafter.  [name] HYDROcodone-acetaminophen (NORCO/VICODIN) 5-325 MG per tablet Take 1 tablet by mouth as needed.  . hydrOXYzine (ATARAX/VISTARIL) 10 MG tablet TAKE ONE TABLET BY MOUTH TWICE DAILY AS NEEDED  . lisinopril-hydrochlorothiazide (PRINZIDE,ZESTORETIC) 20-25 MG per tablet TAKE ONE TABLET BY MOUTH EVERY DAY  . Meclizine HCl 25 MG CHEW Chew 1 tablet (25 mg total) by mouth 3 (three) times daily before meals. For nausea or vertigo  . meloxicam (MOBIC) 15 MG tablet Take 1 tablet (15 mg total) by mouth daily.  . metoprolol succinate (TOPROL-XL) 25 MG 24 hr tablet TAKE ONE TABLET BY MOUTH EVERY DAY  . ondansetron (ZOFRAN) 8 MG tablet Take 1 tablet (8 mg total) by mouth every 8 (eight) hours as needed for nausea or vomiting.  [name] PARoxetine (PAXIL) 10 MG tablet TAKE ONE TABLET BY MOUTH IN THE MORNING  . promethazine (PHENERGAN) 25 MG tablet Take 1 tablet (25 mg total) by mouth every 8 (eight) hours as needed for nausea or vomiting.  . traMADol (ULTRAM) 50 MG tablet Take 1 tablet (50 mg total) by mouth every 8 (eight) hours as needed for pain.  . vitamin B-12 (CYANOCOBALAMIN) 500 MCG tablet Take 500 mcg by mouth daily.    . vitamin E 100 UNIT capsule Take 100 Units by mouth daily.  :  Review of Systems:  Out of a complete 14 point review of systems, all are reviewed and negative with the exception of these symptoms as listed below:   Review of Systems  Constitutional: Negative.   HENT: Positive for hearing loss and tinnitus.   Eyes: Negative.   Respiratory: Negative.   Cardiovascular: Negative.   Gastrointestinal: Negative.   Endocrine: Negative.   Genitourinary: Negative.   Musculoskeletal: Negative.   Skin: Negative.   Allergic/Immunologic: Negative.   Neurological: Positive for dizziness.  Hematological: Negative.   Psychiatric/Behavioral: Negative.     Objective:  Neurologic Exam  Physical Exam Physical Examination:    Filed Vitals:   02/12/14 1114  BP: 152/79  Pulse: 78  Temp: 96.6 F (35.9 C)    General Examination: The patient is a very pleasant 58 y.o. female in no acute distress. She appears, deconditioned, and older than stated age. She is adequately groomed. She is in good spirits today.  HEENT: Normocephalic, atraumatic, pupils are equal, round and reactive to light and accommodation. Funduscopic exam is normal with sharp disc margins noted on the L. She has b/l cataracts. Extraocular tracking is good without limitation to gaze excursion or nystagmus noted. Normal smooth pursuit is noted. Hearing is grossly intact on the right and impaired on the left,  she reports muffled sounds on the left. Tympanic membranes are clear bilaterally. No rash or vesicles. Face is symmetric with normal facial animation and normal facial sensation. Speech is clear with no dysarthria noted. There is no hypophonia. There is no lip, neck/head, jaw or voice tremor. Neck is supple with full range of passive and active motion. There are no carotid bruits on auscultation. Oropharynx exam reveals: moderate mouth dryness, poor dental hygiene with several missing teeth and mild airway crowding, due to narrow airway entry. Mallampati is class II. Tongue protrudes centrally and palate elevates symmetrically. Tonsils are 1+.   Chest: Clear to auscultation without wheezing, rhonchi or crackles noted.  Heart: S1+S2+0, regular and normal without murmurs, rubs or gallops noted.   Abdomen: Soft, non-tender and non-distended with normal bowel sounds appreciated on auscultation.  Extremities: There is no pitting edema in the distal lower extremities bilaterally. Pedal pulses are intact.  Skin: Warm and dry without trophic changes noted. There are no varicose veins.  Musculoskeletal: exam reveals no obvious joint deformities, tenderness or joint swelling or erythema.   Neurologically:  Mental status: The patient is awake, alert and  oriented in all 4 spheres. Her immediate and remote memory, attention, language skills and fund of knowledge are appropriate. There is no evidence of aphasia, agnosia, apraxia or anomia. Speech is clear with normal prosody and enunciation. Thought process is linear. Mood is normal and affect is normal.  Cranial nerves II - XII are as described above under HEENT exam. In addition: shoulder shrug is normal with equal shoulder height noted. Motor exam: Normal bulk, strength and tone is noted. There is no drift, tremor or rebound. Romberg is negative. Reflexes are 2+ in the UEs and 3+ in the LEs. Babinski: Toes are flexor bilaterally. Fine motor skills and coordination: intact with normal finger taps, normal hand movements, normal rapid alternating patting, normal foot taps and normal foot agility.  Cerebellar testing: No dysmetria or intention tremor on finger to nose testing. Heel to shin is unremarkable bilaterally, with the exception of right hip pain reported. There is no truncal ataxia.  Sensory exam: intact to light touch.  Gait, station and balance: She stands up slowly. She does have some right hip pain. She does not lean or vee to one side. Posture is age-appropriate but stance is slightly wide-based. She is not able to walk tandem for more than 2 steps. She walks slowly and cautiously and turns in 3 steps. Her gait and balance are slightly better than before.    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 Her exam continues to be largely nonfocal with the exception of unsteady and cautious gait, somewhat improved, and left-sided hearing  loss, unchanged. She reports intermittent residual dizziness, but has improved with respect to her left ear pain and left ear itching with intermittent itching and very intermittent pain reported as a residual. She had another test at her ENT provider's office and is going back for followup tomorrow. Her physical therapy will resume after that as I understand. From my end of things she is doing better. She is advised that residual dizziness maybe something  she can improved by being better hydrated. She is strongly advised to quit smoking, quit drinking sodas and drink more water. For her residual postherpetic pain and paresthesias as well as itching I suggested she continue with as needed Atarax once daily and gabapentin 100 mg as needed once daily at night. I refilled her prescriptions and would like to see her back in 6 months, sooner if the need arises. She is advised to change positions slowly, and use her cane for stability.

## 2014-02-12 NOTE — Telephone Encounter (Signed)
I called back.  [facility]oke with [facility].  They will fill Rx as prescribed and contact patient when it's ready for pick up.

## 2014-02-12 NOTE — Telephone Encounter (Signed)
Pharmacy needs to clarify directions on Hydroxyzine--please call.

## 2014-02-12 NOTE — Patient Instructions (Signed)
Please remember, that dizziness and vertigo can recur without warning. It can last hours or days. Please change positions slowly and always stay well-hydrated. Physical therapy with particular attention to vestibular rehabilitation can be very helpful. While there is no specific medication that helps with vertigo. Certain medications can exacerbate vertigo. Follow up with Dr. Willeen CassBennett.   I recommend you continue using Neurontin as needed and Atarax as needed. From my standpoint you are doing better. I will see you back in 6 months.   You need to quit smoking.   You need to drink more water!

## 2014-02-14 ENCOUNTER — Telehealth: Payer: Self-pay | Admitting: Internal Medicine

## 2014-02-14 DIAGNOSIS — Z79899 Other long term (current) drug therapy: Secondary | ICD-10-CM

## 2014-02-14 NOTE — Telephone Encounter (Signed)
Patient stated she saw Dr. Willeen CassBennett on 02/13/14 and was started on Tiamt-HCTZ 37.5-25 mg wanted to make you aware. Patient stated that if you did not agree with this medication Dr. Willeen CassBennett would change.

## 2014-02-14 NOTE — Telephone Encounter (Signed)
But she  Will need to have a BMET in 1-2 weeks to make sure her electolytes are OK because shell be getting double hct ( in both meds).

## 2014-02-14 NOTE — Telephone Encounter (Signed)
Patient advised and will call and set up appointment.

## 2014-02-14 NOTE — Addendum Note (Signed)
Addended by: Sherlene ShamsULLO, TERESA L on: 02/14/2014 04:47 PM   Modules accepted: Orders

## 2014-02-14 NOTE — Telephone Encounter (Signed)
Yes ,  Agree with triamterene hct for her vertigo.

## 2014-03-12 ENCOUNTER — Other Ambulatory Visit: Payer: BC Managed Care – PPO

## 2014-03-12 ENCOUNTER — Telehealth: Payer: Self-pay | Admitting: Internal Medicine

## 2014-03-12 ENCOUNTER — Ambulatory Visit: Payer: BC Managed Care – PPO | Admitting: Adult Health

## 2014-03-12 NOTE — Telephone Encounter (Signed)
Is this the patient you talked to Raquel about? If so it is okay to cancel the appt.

## 2014-03-12 NOTE — Telephone Encounter (Signed)
Pt called to reschedule appt for today due to not transportation. Please advise to cancel appt for today/msn

## 2014-03-14 ENCOUNTER — Other Ambulatory Visit (INDEPENDENT_AMBULATORY_CARE_PROVIDER_SITE_OTHER): Payer: BC Managed Care – PPO

## 2014-03-14 ENCOUNTER — Ambulatory Visit (INDEPENDENT_AMBULATORY_CARE_PROVIDER_SITE_OTHER): Payer: BC Managed Care – PPO | Admitting: Adult Health

## 2014-03-14 ENCOUNTER — Ambulatory Visit (INDEPENDENT_AMBULATORY_CARE_PROVIDER_SITE_OTHER)
Admission: RE | Admit: 2014-03-14 | Discharge: 2014-03-14 | Disposition: A | Discharge status: Home / Self Care | Payer: BC Managed Care – PPO | Source: Ambulatory Visit | Attending: Adult Health | Admitting: Adult Health

## 2014-03-14 ENCOUNTER — Encounter: Payer: Self-pay | Admitting: Adult Health

## 2014-03-14 VITALS — BP 130/82 | HR 70 | Temp 98.1°F | Resp 14 | Wt 160.2 lb

## 2014-03-14 DIAGNOSIS — M25512 Pain in left shoulder: Secondary | ICD-10-CM

## 2014-03-14 DIAGNOSIS — M25519 Pain in unspecified shoulder: Secondary | ICD-10-CM

## 2014-03-14 DIAGNOSIS — Z79899 Other long term (current) drug therapy: Secondary | ICD-10-CM

## 2014-03-14 LAB — BASIC METABOLIC PANEL
BUN: 15 mg/dL (ref 6–23)
CO2: 27 meq/L (ref 19–32)
Calcium: 9.4 mg/dL (ref 8.4–10.5)
Chloride: 105 mEq/L (ref 96–112)
Creatinine, Ser: 1.1 mg/dL (ref 0.4–1.2)
GFR: 53.48 mL/min — ABNORMAL LOW (ref >60.00)
Glucose, Bld: 111 mg/dL — ABNORMAL HIGH (ref 70–99)
POTASSIUM: 3.4 meq/L — AB (ref 3.5–5.1)
SODIUM: 139 meq/L (ref 135–145)

## 2014-03-14 NOTE — Progress Notes (Signed)
Patient ID: Patricia KinnierDiane R Fields, female   DOB: 1956-08-21, 58 y.o.   MRN: [id number]   Subjective:    Patient ID: Patricia Kinnieriane R [place]bleton, female    DOB: 1956-08-21, 58 y.o.   MRN: [id number]  HPI  Pt is a 58 y/o female who is here with pain in her arm s/p fall approximately 1 week ago. She reports that she lost her footing and fell on her porch and left arm landed against a post. She has problems moving her arm without experiencing pain. Has been applying salonpas patches.  Past Medical History  Diagnosis Date  . Peripheral arterial disease Feb 2011    by renal artery duplex  . Hypertension   . Hyperlipidemia   . 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 History of nephrolithiasis   . Interstitial cystitis   . Tobacco abuse     one pack daily    Current Outpatient Prescriptions on File Prior to Visit  Medication Sig Dispense Refill  . albuterol (PROVENTIL HFA;VENTOLIN HFA) 108 (90 BASE) MCG/ACT inhaler Inhale 2 puffs into the lungs every 6 (six) hours as needed for wheezing.  1 Inhaler  1  . aspirin 81 MG tablet Take 81 mg by mouth daily.        [name]. b complex vitamins capsule Take 1 capsule by mouth daily.      [name]. buPROPion (WELLBUTRIN SR) 150 MG 12 hr tablet TAKE ONE TABLET BY MOUTH TWICE DAILY  60 tablet  2  . Cholecalciferol (VITAMIN D3) 1000 UNITS CAPS Take by mouth.        . gabapentin (NEURONTIN) 100 MG capsule Take 1 capsule (100 mg total) by mouth at bedtime. 1 pill nightly at bedtime as needed for L ear pain  30 capsule  5  . hydrOXYzine (ATARAX/VISTARIL) 10 MG tablet Use once daily as needed for L ear itch.  30 tablet  3  . lisinopril-hydrochlorothiazide (PRINZIDE,ZESTORETIC) 20-25 MG per tablet TAKE ONE TABLET BY MOUTH EVERY DAY  90 tablet  3  . Meclizine HCl 25 MG CHEW Chew 1 tablet (25 mg total) by mouth 3 (three) times daily before meals. For nausea or vertigo  30 each  0  . metoprolol succinate (TOPROL-XL) 25 MG 24 hr tablet TAKE ONE TABLET BY MOUTH EVERY DAY  90  tablet  3  . ondansetron (ZOFRAN) 8 MG tablet Take 1 tablet (8 mg total) by mouth every 8 (eight) hours as needed for nausea or vomiting.  30 tablet  0  . PARoxetine (PAXIL) 10 MG tablet TAKE ONE TABLET BY MOUTH IN THE MORNING  90 tablet  0  . vitamin B-12 (CYANOCOBALAMIN) 500 MCG tablet Take 500 mcg by mouth daily.        . vitamin E 100 UNIT capsule Take 100 Units by mouth daily.       No current facility-administered medications on file prior to visit.     Review of Systems  Musculoskeletal:       Pain in left arm following fall approximately 1.5 weeks ago.   All other systems reviewed and are negative.      Objective:  There were no vitals taken for this visit.   Physical Exam  Constitutional: She is oriented to person, place, and time. No distress.  HENT:  Head: Normocephalic and atraumatic.  Cardiovascular: Normal rate and regular rhythm.   Pulmonary/Chest: Effort normal. No respiratory distress.  Musculoskeletal: Normal range of motion. She exhibits tenderness.  Left arm  pain with movement. Decreased ROM. Cannot abduct arm fully. Movement to only approximately 30-45 degrees. Pain with palpation of anterior shoulder.  Neurological: She is alert and oriented to person, place, and time.  Skin: Skin is warm and dry.  Psychiatric: She has a normal mood and affect. Her behavior is normal. Judgment and thought content normal.      Assessment & Plan:   1. Pain in joint, shoulder region, left Pain following fall. Decreased ROM. I am sending her for shoulder xray to r/o fx. Immobilize with sling. Take ibuprofen 2 tablets every 6 hours for the next 4 days with food. Ice alternating with heat. May need referral to Ortho if no improvement. - DG Shoulder Left; Future

## 2014-03-14 NOTE — Patient Instructions (Signed)
  [facility] for an [facility] of your left shoulder.  We will contact you with results once available along with further instructions. You may need to be seen by an Orthopedic doctor but we will first wait for the xray results.  [facility] the [facility]/arm.  Apply ice for 15 min to the area that is painful. Do this 3-4 times a day. You can also apply heat in the same way.  Take ibuprofen if you can tolerate this medication. Also called Advil. Take 2 tables every 6 hours for the next 4 days with food.

## 2014-03-14 NOTE — Progress Notes (Signed)
Pre visit review using our clinic review tool, if applicable. No additional management support is needed unless otherwise documented below in the visit note. 

## 2014-03-16 MED ORDER — POTASSIUM CHLORIDE CRYS ER 20 MEQ PO TBCR: 20.0000 meq | EXTENDED_RELEASE_TABLET | Freq: Every day | ORAL | Status: DC

## 2014-03-16 NOTE — Addendum Note (Signed)
Addended by: Sherlene ShamsULLO, TERESA L on: 03/16/2014 09:44 PM   Modules accepted: Orders

## 2014-03-17 ENCOUNTER — Telehealth: Payer: Self-pay | Admitting: *Deleted

## 2014-03-17 NOTE — Telephone Encounter (Signed)
Pt called requesting Xray results.  Please advise 

## 2014-03-17 NOTE — Telephone Encounter (Signed)
This patient was seen by raquel , not by  me so the message should have been sent to her. Raquel's message to patient from July 24 never got routed.   The x rays were normal.  No fractures or dislocation

## 2014-03-18 NOTE — Telephone Encounter (Signed)
Patient notified of Xray results.

## 2014-03-20 ENCOUNTER — Telehealth: Payer: Self-pay | Admitting: Internal Medicine

## 2014-03-20 DIAGNOSIS — E876 Hypokalemia: Secondary | ICD-10-CM

## 2014-03-20 NOTE — Telephone Encounter (Signed)
The form is for the Vertigo, while on phone patient mentioned pharamcy cautioned her on the use of the potassium prescribed for patient recent low potasium level and the use of Lisinopril and Hctz, patient stated a few days before labs she had diarrhea and ask if this could cause the low potassium. Should she have it rechecked please advise.

## 2014-03-20 NOTE — Telephone Encounter (Signed)
Patient dropped of FMLA form with no noted.  that is Due on Monday. Why is she asking for FMLA?  For shoulder, which was not fractured?  Or for the vertigo from 6 months ago? The form will be late since I am out of the office tomorrow

## 2014-03-20 NOTE — Telephone Encounter (Signed)
Yes I am well aware of the potentional interaction between lisinopril and potassiu,  But the daily potassium dose I prescribed is not enough to raise her potassium too high   She should have a BMET done next Monday when she picks up the form  . I'll order it. .[name]

## 2014-03-23 DIAGNOSIS — Z0279 Encounter for issue of other medical certificate: Secondary | ICD-10-CM

## 2014-03-24 ENCOUNTER — Telehealth: Payer: Self-pay | Admitting: Internal Medicine

## 2014-03-24 NOTE — Telephone Encounter (Signed)
Patient's form for leave of absence is complete except date of return to work.  Since she hasn;t saeen me since February I cannot  Fill out this part without more info

## 2014-03-24 NOTE — Telephone Encounter (Signed)
Patient has been out of work since 08/30/13 and Dr. Wardell HeathBennet took patient out of work but insurance stated that primary must fill out disability along with dr. Wardell HeathBennet. 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 Willeen CassBennett and know has to walk with cane to keep from falling. Patient stated she is also unable to drive due to the condition shingles has caused with her inner ear. Please advise paper work is for long term disability.

## 2014-03-24 NOTE — Telephone Encounter (Signed)
Ok, return the form to me.

## 2014-03-24 NOTE — Telephone Encounter (Signed)
Patient saw Dr. Wardell HeathBennet today and was taken off the lisinopril- HCTZ paatient has continued the potassium. FYI

## 2014-03-25 ENCOUNTER — Encounter: Payer: Self-pay | Admitting: Otolaryngology

## 2014-03-25 NOTE — Telephone Encounter (Signed)
I have not seen the patient in 6 months  I will not write for long term disability because I have not examined her and found her to be 100% disabled.  I can only write that she cannot return to her prior position /work capacity.,  But can do sedentary work..  That's the best I can do.

## 2014-03-25 NOTE — Telephone Encounter (Signed)
Form returned in [employer]older.

## 2014-03-26 NOTE — Telephone Encounter (Signed)
Faxed form for patient sent copy to scan and billing, patient notified form could only be filled out for not able to return to regular duties but can consider other options.Sedentary work.

## 2014-04-02 ENCOUNTER — Telehealth: Payer: Self-pay | Admitting: Internal Medicine

## 2014-04-02 ENCOUNTER — Other Ambulatory Visit: Payer: Self-pay | Admitting: Internal Medicine

## 2014-04-02 DIAGNOSIS — R2681 Unsteadiness on feet: Secondary | ICD-10-CM

## 2014-04-02 DIAGNOSIS — H8122 Vestibular neuronitis, left ear: Secondary | ICD-10-CM

## 2014-04-02 NOTE — Telephone Encounter (Signed)
Please call pt about disability information

## 2014-04-03 ENCOUNTER — Telehealth: Payer: Self-pay | Admitting: Internal Medicine

## 2014-04-03 DIAGNOSIS — R2681 Unsteadiness on feet: Secondary | ICD-10-CM | POA: Insufficient documentation

## 2014-04-03 DIAGNOSIS — Z0279 Encounter for issue of other medical certificate: Secondary | ICD-10-CM

## 2014-04-03 MED ORDER — ZOSTER VACCINE LIVE 19400 UNT/0.65ML ~~LOC~~ SOLR: 0.6500 mL | Freq: Once | SUBCUTANEOUS | Status: DC

## 2014-04-03 NOTE — Telephone Encounter (Signed)
Spoke with pt, she is requesting a Rx [facility]  Pt also states the insurance company needs office notes reflecting shingles outbreak.  Please advise

## 2014-04-03 NOTE — Telephone Encounter (Signed)
I have filled out the restrictiosn form required by her insurance [employer].  Please submit charge of $50 for the form and notify pateint that it has been done.  Fax to Rudy Jewiffany Ivey along with the office notes I printed off from me and Dr Frances FurbishAthar

## 2014-04-04 NOTE — Telephone Encounter (Signed)
Forms faxed

## 2014-04-04 NOTE — Telephone Encounter (Signed)
The form has been completed and returned to you/Kathy and [facility]

## 2014-04-15 DIAGNOSIS — Z0289 Encounter for other administrative examinations: Secondary | ICD-10-CM

## 2014-04-22 ENCOUNTER — Encounter: Payer: Self-pay | Admitting: Otolaryngology

## 2014-05-07 ENCOUNTER — Other Ambulatory Visit: Payer: Self-pay | Admitting: Internal Medicine

## 2014-05-08 ENCOUNTER — Telehealth: Payer: Self-pay | Admitting: *Deleted

## 2014-05-08 NOTE — Telephone Encounter (Signed)
Form fax to [facility] on 05/08/14.

## 2014-05-22 ENCOUNTER — Encounter: Payer: Self-pay | Admitting: Otolaryngology

## 2014-06-16 ENCOUNTER — Other Ambulatory Visit: Payer: Self-pay | Admitting: Internal Medicine

## 2014-07-21 ENCOUNTER — Ambulatory Visit (INDEPENDENT_AMBULATORY_CARE_PROVIDER_SITE_OTHER): Payer: BC Managed Care – PPO | Admitting: Internal Medicine

## 2014-07-21 ENCOUNTER — Other Ambulatory Visit: Payer: Self-pay | Admitting: *Deleted

## 2014-07-21 ENCOUNTER — Encounter: Payer: Self-pay | Admitting: Internal Medicine

## 2014-07-21 ENCOUNTER — Other Ambulatory Visit: Payer: Self-pay | Admitting: Internal Medicine

## 2014-07-21 VITALS — BP 140/70 | HR 75 | Temp 97.9°F | Ht 58.5 in | Wt 170.2 lb

## 2014-07-21 DIAGNOSIS — E876 Hypokalemia: Secondary | ICD-10-CM

## 2014-07-21 DIAGNOSIS — R300 Dysuria: Secondary | ICD-10-CM

## 2014-07-21 DIAGNOSIS — Z720 Tobacco use: Secondary | ICD-10-CM

## 2014-07-21 DIAGNOSIS — J069 Acute upper respiratory infection, unspecified: Secondary | ICD-10-CM

## 2014-07-21 DIAGNOSIS — N3010 Interstitial cystitis (chronic) without hematuria: Secondary | ICD-10-CM

## 2014-07-21 DIAGNOSIS — Z1211 Encounter for screening for malignant neoplasm of colon: Secondary | ICD-10-CM

## 2014-07-21 DIAGNOSIS — Z87442 Personal history of urinary calculi: Secondary | ICD-10-CM

## 2014-07-21 DIAGNOSIS — Z139 Encounter for screening, unspecified: Secondary | ICD-10-CM

## 2014-07-21 LAB — BASIC METABOLIC PANEL
BUN: 17 mg/dL (ref 6–23)
CALCIUM: 9 mg/dL (ref 8.4–10.5)
CO2: 23 mEq/L (ref 19–32)
CREATININE: 1 mg/dL (ref 0.4–1.2)
Chloride: 105 mEq/L (ref 96–112)
GFR: 58.89 mL/min — ABNORMAL LOW (ref >60.00)
Glucose, Bld: 113 mg/dL — ABNORMAL HIGH (ref 70–99)
Potassium: 3.8 mEq/L (ref 3.5–5.1)
Sodium: 136 mEq/L (ref 135–145)

## 2014-07-21 LAB — POCT URINALYSIS DIPSTICK
Bilirubin, UA: NEGATIVE
Blood, UA: NEGATIVE
Glucose, UA: NEGATIVE
KETONES UA: NEGATIVE
LEUKOCYTES UA: NEGATIVE
NITRITE UA: NEGATIVE
PH UA: 5.5
PROTEIN UA: NEGATIVE
Spec Grav, UA: 1.02
UROBILINOGEN UA: 0.2

## 2014-07-21 MED ORDER — CEFUROXIME AXETIL 250 MG PO TABS: 250.0000 mg | ORAL_TABLET | Freq: Two times a day (BID) | ORAL | Status: DC

## 2014-07-21 MED ORDER — BUPROPION HCL ER (SR) 150 MG PO TB12: 150.0000 mg | ORAL_TABLET | Freq: Two times a day (BID) | ORAL | Status: DC

## 2014-07-21 MED ORDER — PAROXETINE HCL 10 MG PO TABS: 10.0000 mg | ORAL_TABLET | Freq: Every morning | ORAL | Status: DC

## 2014-07-21 MED ORDER — ALBUTEROL SULFATE HFA 108 (90 BASE) MCG/ACT IN AERS: 2.0000 | INHALATION_SPRAY | Freq: Four times a day (QID) | RESPIRATORY_TRACT | Status: DC | PRN

## 2014-07-21 MED ORDER — PHENAZOPYRIDINE HCL 200 MG PO TABS: 200.0000 mg | ORAL_TABLET | Freq: Three times a day (TID) | ORAL | Status: DC | PRN

## 2014-07-21 MED ORDER — FLUTICASONE PROPIONATE HFA 110 MCG/ACT IN AERO: 2.0000 | INHALATION_SPRAY | Freq: Two times a day (BID) | RESPIRATORY_TRACT | Status: DC

## 2014-07-21 NOTE — Progress Notes (Signed)
Pre visit review using our clinic review tool, if applicable. No additional management support is needed unless otherwise documented below in the visit note. 

## 2014-07-21 NOTE — Telephone Encounter (Signed)
Ok to refill,  Refill sent . Patient needs to schedule an appt with me for annual exam

## 2014-07-21 NOTE — Patient Instructions (Signed)
Saline nasal spray - flush nose at least 2-3x/day.  nasacort nasal spray - 2 sprays each nostril one time per day.  Use the inhalers as directed.    Mucinex DM in the am and Robitussin in the evening.

## 2014-07-21 NOTE — Progress Notes (Signed)
Order placed for urine culture 

## 2014-07-21 NOTE — Telephone Encounter (Signed)
While at acute appoint with Dr Lorin PicketScott today, pt requested refills on Bupropion and Paroxetine.  Please advise refills

## 2014-07-22 ENCOUNTER — Encounter: Payer: Self-pay | Admitting: *Deleted

## 2014-07-22 ENCOUNTER — Encounter: Payer: Self-pay | Admitting: Internal Medicine

## 2014-07-22 DIAGNOSIS — J069 Acute upper respiratory infection, unspecified: Secondary | ICD-10-CM | POA: Insufficient documentation

## 2014-07-22 DIAGNOSIS — R300 Dysuria: Secondary | ICD-10-CM | POA: Insufficient documentation

## 2014-07-22 NOTE — Progress Notes (Signed)
Subjective:    Patient ID: Patricia Fields, female    DOB: 22-May-1956, 58 y.o.   MRN: [id number]  HPI 58 year old female with past history of tobacco abuse, interstitial cystitis, COPD and nephrolithiasis.  She comes in today as a work in with several concerns.  She was worked in for concerns regarding a urinary tract infection.  State symptoms started one week ago.  Noticing some increased urinary odor.  No hematuria.  Previous back pain, but this has resolved.  Some increased frequency and discomfort.  Some dysuria.  States has interstitial cystitis.  Hard to tell if infection vs flare.  Sees Dr Achilles Dunkope.  Needs f/u appt with Dr Achilles Dunkope.  No abdominal pain.  She also reports having problems with increased sinus pressure and right ear hurting.  Has been using saline.  Head symptoms are better, but still with some nasal congestion.  States symptoms have progressed.  Now with increased chest congestion and productive cough.  Has been worsening over the last week.  Still smokes.  States ready to quit.     Past Medical History  Diagnosis Date  . Peripheral arterial disease Feb 2011    by renal artery duplex  . Hypertension   . Hyperlipidemia   . 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 History of nephrolithiasis   . Interstitial cystitis   . Tobacco abuse     one pack daily    Current Outpatient Prescriptions on File Prior to Visit  Medication Sig Dispense Refill  . aspirin 81 MG tablet Take 81 mg by mouth daily.      [name]. b complex vitamins capsule Take 1 capsule by mouth daily.    . Cholecalciferol (VITAMIN D3) 1000 UNITS CAPS Take by mouth.      . gabapentin (NEURONTIN) 100 MG capsule Take 1 capsule (100 mg total) by mouth at bedtime. 1 pill nightly at bedtime as needed for L ear pain 30 capsule 5  . hydrOXYzine (ATARAX/VISTARIL) 10 MG tablet Use once daily as needed for L ear itch. 30 tablet 3  . lisinopril-hydrochlorothiazide (PRINZIDE,ZESTORETIC) 20-25 MG per tablet TAKE  ONE TABLET BY MOUTH EVERY DAY 90 tablet 3  . lisinopril-hydrochlorothiazide (PRINZIDE,ZESTORETIC) 20-25 MG per tablet TAKE ONE TABLET BY MOUTH ONCE DAILY 30 tablet 0  . Meclizine HCl 25 MG CHEW Chew 1 tablet (25 mg total) by mouth 3 (three) times daily before meals. For nausea or vertigo 30 each 0  . metoprolol succinate (TOPROL-XL) 25 MG 24 hr tablet TAKE ONE TABLET BY MOUTH EVERY DAY 90 tablet 3  . ondansetron (ZOFRAN) 8 MG tablet Take 1 tablet (8 mg total) by mouth every 8 (eight) hours as needed for nausea or vomiting. 30 tablet 0  . potassium chloride SA (K-DUR,KLOR-CON) 20 MEQ tablet Take 1 tablet (20 mEq total) by mouth daily. 30 tablet 3  . vitamin B-12 (CYANOCOBALAMIN) 500 MCG tablet Take 500 mcg by mouth daily.      . vitamin E 100 UNIT capsule Take 100 Units by mouth daily.    [name]. zoster vaccine live, PF, (ZOSTAVAX) [id number] UNT/0.65ML injection Inject 19,400 Units into the skin once. 1 each 0   No current facility-administered medications on file prior to visit.    Review of Systems Increased sinus pressure and ear pain as outlined.  Increased nasal congestion.  Increased cough and chest congestion.  Productive cough.  No tightness.  Some wheezing.  No nausea or vomiting.  No diarrhea.  Urinary symptoms as outlined.  Back pain has resolved.  No abdominal pain.       Objective:   Physical Exam Filed Vitals:   07/21/14 1144  BP: 140/70  Pulse: 75  Temp: 97.9 F (6036.696 C)   58 year old female in no acute distress.   HEENT:  Nares- erythematous turbinates.  Oropharynx - without lesions.  Minimal tenderness to palpation over the sinuses.  Cerumen present - right ear.   NECK:  Supple.  Nontender.   HEART:  Appears to be regular. LUNGS:  No crackles.  Respirations even and unlabored.  Some increased cough with forced expiration. ABDOMEN:  Soft, nontender.  Bowel sounds present and normal.  No audible abdominal bruit.  BACK:  Non tender.  No CVA tenderness.  SKIN:  Small patch -  erythematous rash.  (pt reports itches)         Assessment & Plan:  Interstitial cystitis Has a history of interstitial cystitis.  Having some urinary discomfort and symptoms as outlined.  Urine dip negative.  Will send for culture.  Pyridium to help with the discomfort.  She plans to f/u with Dr Achilles Dunkope.    Tobacco abuse Discussed need to quit smoking.  She feels she is ready to try and quit. Plans to try to decrease.  Plans to discuss more with Dr Darrick Huntsmanullo at her next visit.    History of nephrolithiasis No back pain now.  Plan f/u with Dr Achilles Dunkope as outlined.    URI (upper respiratory infection) Symptoms as outlined.  Ceftin as directed.  Probiotic with abx.  Saline nasal spray and nasacort as directed.  Flovent inhaler and albuterol inhaler as directed.  Robitussin as directed.  She has taken robitussin an tolerated.  Cannot tolerate mucinex.  Follow.  Hold on cxr.    Dysuria See above.  Urine dip negative.  Has a history of interstitial cystitis.  Will send for urine culture.    I spent 25 minutes with the patient and more than 50% of the time was spent in consultation regarding the above.

## 2014-07-22 NOTE — Telephone Encounter (Signed)
No answer, no VM set up., unable to leave message

## 2014-07-22 NOTE — Telephone Encounter (Signed)
Spoke with pt, scheduled CPE on 12.24.15 at 9 am

## 2014-07-23 LAB — CULTURE, URINE COMPREHENSIVE
COLONY COUNT: NO GROWTH
Organism ID, Bacteria: NO GROWTH

## 2014-07-24 ENCOUNTER — Other Ambulatory Visit: Payer: Self-pay | Admitting: *Deleted

## 2014-07-24 MED ORDER — LISINOPRIL-HYDROCHLOROTHIAZIDE 20-25 MG PO TABS: 1.0000 | ORAL_TABLET | Freq: Every day | ORAL | Status: DC

## 2014-07-24 NOTE — Telephone Encounter (Signed)
While giving patient results, stated she needed refill Lisinopril. Has appt with Dr. Darrick Huntsmanullo 08/14/14. Rx sent to pharmacy by escript

## 2014-08-01 ENCOUNTER — Ambulatory Visit: Payer: BC Managed Care – PPO | Admitting: Internal Medicine

## 2014-08-07 DIAGNOSIS — Z0289 Encounter for other administrative examinations: Secondary | ICD-10-CM

## 2014-08-12 ENCOUNTER — Ambulatory Visit: Payer: BC Managed Care – PPO | Admitting: Neurology

## 2014-08-14 ENCOUNTER — Encounter: Payer: Self-pay | Admitting: Internal Medicine

## 2014-08-14 ENCOUNTER — Ambulatory Visit (INDEPENDENT_AMBULATORY_CARE_PROVIDER_SITE_OTHER): Payer: BC Managed Care – PPO | Admitting: Internal Medicine

## 2014-08-14 VITALS — BP 136/84 | HR 69 | Temp 98.3°F | Resp 14 | Wt 167.5 lb

## 2014-08-14 DIAGNOSIS — Z124 Encounter for screening for malignant neoplasm of cervix: Secondary | ICD-10-CM

## 2014-08-14 DIAGNOSIS — G8929 Other chronic pain: Secondary | ICD-10-CM

## 2014-08-14 DIAGNOSIS — J431 Panlobular emphysema: Secondary | ICD-10-CM

## 2014-08-14 DIAGNOSIS — Z79899 Other long term (current) drug therapy: Secondary | ICD-10-CM

## 2014-08-14 DIAGNOSIS — E785 Hyperlipidemia, unspecified: Secondary | ICD-10-CM

## 2014-08-14 DIAGNOSIS — Z23 Encounter for immunization: Secondary | ICD-10-CM

## 2014-08-14 DIAGNOSIS — Z8619 Personal history of other infectious and parasitic diseases: Secondary | ICD-10-CM

## 2014-08-14 DIAGNOSIS — R5383 Other fatigue: Secondary | ICD-10-CM

## 2014-08-14 DIAGNOSIS — Z716 Tobacco abuse counseling: Secondary | ICD-10-CM

## 2014-08-14 DIAGNOSIS — R2681 Unsteadiness on feet: Secondary | ICD-10-CM

## 2014-08-14 DIAGNOSIS — Z1159 Encounter for screening for other viral diseases: Secondary | ICD-10-CM

## 2014-08-14 DIAGNOSIS — Z1239 Encounter for other screening for malignant neoplasm of breast: Secondary | ICD-10-CM

## 2014-08-14 DIAGNOSIS — E559 Vitamin D deficiency, unspecified: Secondary | ICD-10-CM

## 2014-08-14 DIAGNOSIS — Z0000 Encounter for general adult medical examination without abnormal findings: Secondary | ICD-10-CM

## 2014-08-14 DIAGNOSIS — M25512 Pain in left shoulder: Secondary | ICD-10-CM

## 2014-08-14 LAB — CBC WITH DIFFERENTIAL/PLATELET
BASOS ABS: 0.1 10*3/uL (ref 0.0–0.1)
Basophils Relative: 1.1 % (ref 0.0–3.0)
EOS ABS: 0.4 10*3/uL (ref 0.0–0.7)
Eosinophils Relative: 4.5 % (ref 0.0–5.0)
HCT: 43.1 % (ref 36.0–46.0)
HEMOGLOBIN: 14.6 g/dL (ref 12.0–15.0)
LYMPHS ABS: 2.6 10*3/uL (ref 0.7–4.0)
LYMPHS PCT: 31 % (ref 12.0–46.0)
MCHC: 33.8 g/dL (ref 30.0–36.0)
MCV: 93.8 fl (ref 78.0–100.0)
Monocytes Absolute: 0.6 10*3/uL (ref 0.1–1.0)
Monocytes Relative: 6.8 % (ref 3.0–12.0)
NEUTROS ABS: 4.8 10*3/uL (ref 1.4–7.7)
Neutrophils Relative %: 56.6 % (ref 43.0–77.0)
Platelets: 293 10*3/uL (ref 150.0–400.0)
RBC: 4.59 Mil/uL (ref 3.87–5.11)
RDW: 14.1 % (ref 11.5–15.5)
WBC: 8.5 10*3/uL (ref 4.0–10.5)

## 2014-08-14 LAB — HEPATITIS C ANTIBODY: HCV Ab: NEGATIVE

## 2014-08-14 LAB — LIPID PANEL
CHOL/HDL RATIO: 6
Cholesterol: 279 mg/dL — ABNORMAL HIGH (ref 0–200)
HDL: 49.6 mg/dL (ref >39.00)
LDL Cholesterol: 201 mg/dL — ABNORMAL HIGH (ref 0–99)
NonHDL: 229.4
TRIGLYCERIDES: 140 mg/dL (ref 0.0–149.0)
VLDL: 28 mg/dL (ref 0.0–40.0)

## 2014-08-14 LAB — TSH: TSH: 1.26 u[IU]/mL (ref 0.35–4.50)

## 2014-08-14 MED ORDER — FLUTICASONE-SALMETEROL 250-50 MCG/DOSE IN AEPB: 1.0000 | INHALATION_SPRAY | Freq: Two times a day (BID) | RESPIRATORY_TRACT | Status: DC

## 2014-08-14 MED ORDER — ZOSTER VACCINE LIVE 19400 UNT/0.65ML ~~LOC~~ SOLR: 0.6500 mL | Freq: Once | SUBCUTANEOUS | Status: DC

## 2014-08-14 MED ORDER — TRAMADOL HCL 50 MG PO TABS
50.0000 mg | ORAL_TABLET | Freq: Four times a day (QID) | ORAL | Status: DC | PRN
Start: 2014-08-14 — End: 2015-04-22

## 2014-08-14 NOTE — Progress Notes (Addendum)
Patient ID: Patricia KinnierDiane R Gordon, female   DOB: 06/15/1956, 58 y.o.   MRN: [id number]  Subjective:     Patricia Fields is a 58 y.o. female and is here for a comprehensive physical exam. The patient reports resolved COPD exacerbation..  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  She continues to have frequent falls and her son has moved in with her.  She has had  2 falls in the last 6 months   Still having left anterior shoulder pain  Since her fall in July.  Plain films were done and negative for fractures , but no further  workup was done since her fall in July,  Her pain is aggravated by internal rotation and abduction.  She is using salon Pas patches for minimal transient relief of pain,   History   Social History  . Marital Status: Single    Spouse Name: N/A    Number of Children: N/A  . Years of Education: N/A   Occupational History  . Not on file.   Social History Main Topics  . Smoking status: Current Every Day Smoker -- 1.00 packs/day    Types: Cigarettes  . Smokeless tobacco: Never Used  . Alcohol Use: No  . Drug Use: No  . Sexual Activity: Not Currently   Other Topics Concern  . Not on file   Social History Narrative   Health Maintenance  Topic Date Due  . PAP SMEAR  09/27/2001  . INFLUENZA VACCINE  03/23/2015  . MAMMOGRAM  06/12/2015  . TETANUS/TDAP  07/24/2021  . COLONOSCOPY  03/19/2022    The following portions of the patient's history were reviewed and updated as appropriate: allergies, current medications, past family history, past medical history, past social history, past surgical history and problem list.  Review of Systems Pertinent items are noted in HPI.  Patient denies headache, fevers, malaise, unintentional weight loss, skin rash, eye pain, sinus congestion and sinus pain, sore throat, dysphagia,   hemoptysis , cough, dyspnea, wheezing, chest pain, palpitations, orthopnea, edema, abdominal pain, nausea, melena, diarrhea, constipation, flank pain, dysuria, hematuria, urinary  Frequency, nocturia, numbness, tingling, seizures,  Focal weakness, Loss of consciousness,  Tremor, insomnia, depression, anxiety, and suicidal ideation.     Objective:   BP 136/84 mmHg  Pulse 69  Temp(Src) 98.3 F (36.8 C) (Oral)  Resp 14  Wt 167 lb 8 oz (75.978 kg)  SpO2 97%  General appearance: alert, cooperative and appears stated age Head: Normocephalic, without obvious abnormality, atraumatic Eyes: conjunctivae/corneas clear. PERRL, EOM's intact. Fundi benign. Ears: normal TM's and external ear canals both ears Nose: Nares normal. Septum midline. Mucosa normal. No drainage or sinus tenderness. Throat: lips, mucosa, and tongue normal; teeth and gums normal Neck: no adenopathy, no carotid bruit, no JVD, supple, symmetrical, trachea midline and thyroid not enlarged, symmetric, no tenderness/mass/nodules Lungs: clear to auscultation bilaterally Breasts: normal appearance, no masses or tenderness Heart: regular rate and rhythm, S1, S2 normal, no murmur, click, rub or gallop Abdomen: soft, non-tender; bowel sounds normal; no masses,  no organomegaly Extremities: extremities normal, atraumatic, no cyanosis or edema Pulses: 2+ and symmetric Skin: Skin color, texture, turgor normal. No rashes or lesions Neurologic: Alert and oriented X 3, normal strength and tone. Normal symmetric reflexes. Normal coordination and gait.    Assessment and Plan:  Problem List Items Addressed This Visit    Breast cancer screening    Breast exam done and 3D mammogram ordered    COPD (chronic obstructive pulmonary disease)    Secondary to tobacco abuse,  She cannot afford flovent and albuterol MDIs.  [employer] application /referral for home duonebs in process    Relevant Medications      Fluticasone-Salmeterol (ADVAIR) 250-50  MCG/DOSE AEPB   Encounter for preventive health examination - Primary    Annual wellness  exam was done as well as a comprehensive physical exam and management of acute and chronic conditions .  During the course of the visit the patient was educated and counseled about appropriate screening and preventive services including :  diabetes screening, lipid analysis with projected  10 year  risk for CAD , nutrition counseling, colorectal cancer screening, and recommended immunizations.  Printed recommendations for health maintenance screenings was given.     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  She has recurrent falls and hearing loss as well,  Now on disability from prior occupation as Engineer, production for [employer].     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  She is requesting a new rx for the Zostavax ,  Which was given     Hyperlipidemia    Her 10 yr risk of CAD is 255.  LDL is 201.  Atorvastatin advised.   Lab Results  Component Value Date   CHOL 279* 08/14/2014   HDL 49.60 08/14/2014   LDLCALC 201* 08/14/2014   LDLDIRECT 188.9 05/29/2013   TRIG 140.0 08/14/2014   CHOLHDL 6 08/14/2014       Pain in joint, shoulder region    No fractures on prior evaluation.  Using saloPas patches regularly.  Persistent chronic pain is likely a rotator cuff muscle tear or impingement from bone spurs.  Referral to Dr Hyacinth Meeker for further evaluation.     RESOLVED: Screening for cervical cancer   Tobacco abuse counseling    Smoking cessation instruction/counseling given:  counseled patient on the dangers of tobacco use, advised patient to stop smoking, and reviewed strategies to maximize success     Other Visit Diagnoses    Chronic left shoulder pain        Relevant Orders       Ambulatory referral to Orthopedic Surgery    Need for hepatitis C screening test         Relevant Orders       Hepatitis C antibody (Completed)    Hyperlipemia        Relevant Orders       Lipid panel (Completed)    Other fatigue        Relevant Orders       CBC with Differential (Completed)       TSH (Completed)    Need for shingles vaccine        Relevant Medications       ZOSTAVAX 16109 UNT/0.65ML [employer] SOLR    Vitamin D deficiency        Relevant Orders       Vit D  25 hydroxy (rtn osteoporosis monitoring)    Need for prophylactic vaccination and inoculation against influenza        Relevant Orders       Flu Vaccine QUAD 36+ mos PF IM (Fluarix Quad PF) (Completed)  Need for prophylactic vaccination against Streptococcus pneumoniae (pneumococcus)        Relevant Orders       Pneumococcal polysaccharide vaccine 23-valent greater than or equal to 2yo subcutaneous/IM (Completed)     '

## 2014-08-14 NOTE — Patient Instructions (Signed)
You had your annual exam today  3d mammogram has been ordered  Fasting labs were done today; we'll notify you of the resutls  We will start the paperwork to get the home nebulizer for you to manage your COPD  I have also given you an rx for Advair to use two times daily every day ( if it is affordable)  Referral to Dr Sabra Heck is underway for your left shoulder issues , which may be rotator cuff syndrome  Rotator Cuff Injury Rotator cuff injury is any type of injury to the set of muscles and tendons that make up the stabilizing unit of your shoulder. This unit holds the ball of your upper arm bone (humerus) in the socket of your shoulder blade (scapula).  CAUSES Injuries to your rotator cuff most commonly come from sports or activities that cause your arm to be moved repeatedly over your head. Examples of this include throwing, weight lifting, swimming, or racquet sports. Long lasting (chronic) irritation of your rotator cuff can cause soreness and swelling (inflammation), bursitis, and eventual damage to your tendons, such as a tear (rupture). SIGNS AND SYMPTOMS Acute rotator cuff tear:  Sudden tearing sensation followed by severe pain shooting from your upper shoulder down your arm toward your elbow.  Decreased range of motion of your shoulder because of pain and muscle spasm.  Severe pain.  Inability to raise your arm out to the side because of pain and loss of muscle power (large tears). Chronic rotator cuff tear:  Pain that usually is worse at night and may interfere with sleep.  Gradual weakness and decreased shoulder motion as the pain worsens.  Decreased range of motion. Rotator cuff tendinitis:  Deep ache in your shoulder and the outside upper arm over your shoulder.  Pain that comes on gradually and becomes worse when lifting your arm to the side or turning it inward. DIAGNOSIS Rotator cuff injury is diagnosed through a medical history, physical exam, and imaging exam.  The medical history helps determine the type of rotator cuff injury. Your health care provider will look at your injured shoulder, feel the injured area, and ask you to move your shoulder in different positions. X-ray exams typically are done to rule out other causes of shoulder pain, such as fractures. MRI is the exam of choice for the most severe shoulder injuries because the images show muscles and tendons.  TREATMENT  Chronic tear:  Medicine for pain, such as acetaminophen or ibuprofen.  Physical therapy and range-of-motion exercises may be helpful in maintaining shoulder function and strength.  Steroid injections into your shoulder joint.  Surgical repair of the rotator cuff if the injury does not heal with noninvasive treatment. Acute tear:  Anti-inflammatory medicines such as ibuprofen and naproxen to help reduce pain and swelling.  A sling to help support your arm and rest your rotator cuff muscles. Long-term use of a sling is not advised. It may cause significant stiffening of the shoulder joint.  Surgery may be considered within a few weeks, especially in younger, active people, to return the shoulder to full function.  Indications for surgical treatment include the following:  Age younger than 13 years.  Rotator cuff tears that are complete.  Physical therapy, rest, and anti-inflammatory medicines have been used for 6-8 weeks, with no improvement.  Employment or sporting activity that requires constant shoulder use. Tendinitis:  Anti-inflammatory medicines such as ibuprofen and naproxen to help reduce pain and swelling.  A sling to help support your arm  and rest your rotator cuff muscles. Long-term use of a sling is not advised. It may cause significant stiffening of the shoulder joint.  Severe tendinitis may require:  Steroid injections into your shoulder joint.  Physical therapy.  Surgery. HOME CARE INSTRUCTIONS   Apply ice to your injury:  Put ice in a  plastic bag.  Place a towel between your skin and the bag.  Leave the ice on for 20 minutes, 2-3 times a day.  If you have a shoulder immobilizer (sling and straps), wear it until told otherwise by your health care provider.  You may want to sleep on several pillows or in a recliner at night to lessen swelling and pain.  Only take over-the-counter or prescription medicines for pain, discomfort, or fever as directed by your health care provider.  Do simple hand squeezing exercises with a soft rubber ball to decrease hand swelling. SEEK MEDICAL CARE IF:   Your shoulder pain increases, or new pain or numbness develops in your arm, hand, or fingers.  Your hand or fingers are colder than your other hand. SEEK IMMEDIATE MEDICAL CARE IF:   Your arm, hand, or fingers are numb or tingling.  Your arm, hand, or fingers are increasingly swollen and painful, or they turn white or blue. MAKE SURE YOU:  Understand these instructions.  Will watch your condition.  Will get help right away if you are not doing well or get worse.  Health Maintenance Adopting a healthy lifestyle and getting preventive care can go a long way to promote health and wellness. Talk with your health care provider about what schedule of regular examinations is right for you. This is a good chance for you to check in with your provider about disease prevention and staying healthy. In between checkups, there are plenty of things you can do on your own. Experts have done a lot of research about which lifestyle changes and preventive measures are most likely to keep you healthy. Ask your health care provider for more information. WEIGHT AND DIET  Eat a healthy diet  Be sure to include plenty of vegetables, fruits, low-fat dairy products, and lean protein.  Do not eat a lot of foods high in solid fats, added sugars, or salt.  Get regular exercise. This is one of the most important things you can do for your health.  Most  adults should exercise for at least 150 minutes each week. The exercise should increase your heart rate and make you sweat (moderate-intensity exercise).  Most adults should also do strengthening exercises at least twice a week. This is in addition to the moderate-intensity exercise.  Maintain a healthy weight  Body mass index (BMI) is a measurement that can be used to identify possible weight problems. It estimates body fat based on height and weight. Your health care provider can help determine your BMI and help you achieve or maintain a healthy weight.  For females 60 years of age and older:   A BMI below 18.5 is considered underweight.  A BMI of 18.5 to 24.9 is normal.  A BMI of 25 to 29.9 is considered overweight.  A BMI of 30 and above is considered obese.  Watch levels of cholesterol and blood lipids  You should start having your blood tested for lipids and cholesterol at 59 years of age, then have this test every 5 years.  You may need to have your cholesterol levels checked more often if:  Your lipid or cholesterol levels are high.  You are older than 59 years of age.  You are at high risk for heart disease.  CANCER SCREENING   Lung Cancer  Lung cancer screening is recommended for adults 59-55 years old who are at high risk for lung cancer because of a history of smoking.  A yearly low-dose CT scan of the lungs is recommended for people who:  Currently smoke.  Have quit within the past 15 years.  Have at least a 30-pack-year history of smoking. A pack year is smoking an average of one pack of cigarettes a day for 1 year.  Yearly screening should continue until it has been 15 years since you quit.  Yearly screening should stop if you develop a health problem that would prevent you from having lung cancer treatment.  Breast Cancer  Practice breast self-awareness. This means understanding how your breasts normally appear and feel.  It also means doing  regular breast self-exams. Let your health care provider know about any changes, no matter how small.  If you are in your 20s or 30s, you should have a clinical breast exam (CBE) by a health care provider every 1-3 years as part of a regular health exam.  If you are 43 or older, have a CBE every year. Also consider having a breast X-ray (mammogram) every year.  If you have a family history of breast cancer, talk to your health care provider about genetic screening.  If you are at high risk for breast cancer, talk to your health care provider about having an MRI and a mammogram every year.  Breast cancer gene (BRCA) assessment is recommended for women who have family members with BRCA-related cancers. BRCA-related cancers include:  Breast.  Ovarian.  Tubal.  Peritoneal cancers.  Results of the assessment will determine the need for genetic counseling and BRCA1 and BRCA2 testing. Cervical Cancer Routine pelvic examinations to screen for cervical cancer are no longer recommended for nonpregnant women who are considered low risk for cancer of the pelvic organs (ovaries, uterus, and vagina) and who do not have symptoms. A pelvic examination may be necessary if you have symptoms including those associated with pelvic infections. Ask your health care provider if a screening pelvic exam is right for you.   The Pap test is the screening test for cervical cancer for women who are considered at risk.  If you had a hysterectomy for a problem that was not cancer or a condition that could lead to cancer, then you no longer need Pap tests.  If you are older than 65 years, and you have had normal Pap tests for the past 10 years, you no longer need to have Pap tests.  If you have had past treatment for cervical cancer or a condition that could lead to cancer, you need Pap tests and screening for cancer for at least 20 years after your treatment.  If you no longer get a Pap test, assess your risk  factors if they change (such as having a new sexual partner). This can affect whether you should start being screened again.  Some women have medical problems that increase their chance of getting cervical cancer. If this is the case for you, your health care provider may recommend more frequent screening and Pap tests.  The human papillomavirus (HPV) test is another test that may be used for cervical cancer screening. The HPV test looks for the virus that can cause cell changes in the cervix. The cells collected during the Pap test can  be tested for HPV.  The HPV test can be used to screen women 44 years of age and older. Getting tested for HPV can extend the interval between normal Pap tests from three to five years.  An HPV test also should be used to screen women of any age who have unclear Pap test results.  After 59 years of age, women should have HPV testing as often as Pap tests.  Colorectal Cancer  This type of cancer can be detected and often prevented.  Routine colorectal cancer screening usually begins at 59 years of age and continues through 59 years of age.  Your health care provider may recommend screening at an earlier age if you have risk factors for colon cancer.  Your health care provider may also recommend using home test kits to check for hidden blood in the stool.  A small camera at the end of a tube can be used to examine your colon directly (sigmoidoscopy or colonoscopy). This is done to check for the earliest forms of colorectal cancer.  Routine screening usually begins at age 26.  Direct examination of the colon should be repeated every 5-10 years through 59 years of age. However, you may need to be screened more often if early forms of precancerous polyps or small growths are found. Skin Cancer  Check your skin from head to toe regularly.  Tell your health care provider about any new moles or changes in moles, especially if there is a change in a mole's shape  or color.  Also tell your health care provider if you have a mole that is larger than the size of a pencil eraser.  Always use sunscreen. Apply sunscreen liberally and repeatedly throughout the day.  Protect yourself by wearing long sleeves, pants, a wide-brimmed hat, and sunglasses whenever you are outside. HEART DISEASE, DIABETES, AND HIGH BLOOD PRESSURE   Have your blood pressure checked at least every 1-2 years. High blood pressure causes heart disease and increases the risk of stroke.  If you are between 56 years and 25 years old, ask your health care provider if you should take aspirin to prevent strokes.  Have regular diabetes screenings. This involves taking a blood sample to check your fasting blood sugar level.  If you are at a normal weight and have a low risk for diabetes, have this test once every three years after 59 years of age.  If you are overweight and have a high risk for diabetes, consider being tested at a younger age or more often. PREVENTING INFECTION  Hepatitis B  If you have a higher risk for hepatitis B, you should be screened for this virus. You are considered at high risk for hepatitis B if:  You were born in a country where hepatitis B is common. Ask your health care provider which countries are considered high risk.  Your parents were born in a high-risk country, and you have not been immunized against hepatitis B (hepatitis B vaccine).  You have HIV or AIDS.  You use needles to inject street drugs.  You live with someone who has hepatitis B.  You have had sex with someone who has hepatitis B.  You get hemodialysis treatment.  You take certain medicines for conditions, including cancer, organ transplantation, and autoimmune conditions. Hepatitis C  Blood testing is recommended for:  Everyone born from [birth year] through 1966.  Anyone with known risk factors for hepatitis C. Sexually transmitted infections (STIs)  You should be screened for  sexually  transmitted infections (STIs) including gonorrhea and chlamydia if:  You are sexually active and are younger than 59 years of age.  You are older than 59 years of age and your health care provider tells you that you are at risk for this type of infection.  Your sexual activity has changed since you were last screened and you are at an increased risk for chlamydia or gonorrhea. Ask your health care provider if you are at risk.  If you do not have HIV, but are at risk, it may be recommended that you take a prescription medicine daily to prevent HIV infection. This is called pre-exposure prophylaxis (PrEP). You are considered at risk if:  You are sexually active and do not regularly use condoms or know the HIV status of your partner(s).  You take drugs by injection.  You are sexually active with a partner who has HIV. Talk with your health care provider about whether you are at high risk of being infected with HIV. If you choose to begin PrEP, you should first be tested for HIV. You should then be tested every 3 months for as long as you are taking PrEP.  PREGNANCY   If you are premenopausal and you may become pregnant, ask your health care provider about preconception counseling.  If you may become pregnant, take 400 to 800 micrograms (mcg) of folic acid every day.  If you want to prevent pregnancy, talk to your health care provider about birth control (contraception). OSTEOPOROSIS AND MENOPAUSE   Osteoporosis is a disease in which the bones lose minerals and strength with aging. This can result in serious bone fractures. Your risk for osteoporosis can be identified using a bone density scan.  If you are 9 years of age or older, or if you are at risk for osteoporosis and fractures, ask your health care provider if you should be screened.  Ask your health care provider whether you should take a calcium or vitamin D supplement to lower your risk for osteoporosis.  Menopause may  have certain physical symptoms and risks.  Hormone replacement therapy may reduce some of these symptoms and risks. Talk to your health care provider about whether hormone replacement therapy is right for you.  HOME CARE INSTRUCTIONS   Schedule regular health, dental, and eye exams.  Stay current with your immunizations.   Do not use any tobacco products including cigarettes, chewing tobacco, or electronic cigarettes.  If you are pregnant, do not drink alcohol.  If you are breastfeeding, limit how much and how often you drink alcohol.  Limit alcohol intake to no more than 1 drink per day for nonpregnant women. One drink equals 12 ounces of beer, 5 ounces of wine, or 1 ounces of hard liquor.  Do not use street drugs.  Do not share needles.  Ask your health care provider for help if you need support or information about quitting drugs.  Tell your health care provider if you often feel depressed.  Tell your health care provider if you have ever been abused or do not feel safe at home. Document Released: 02/21/2011 Document Revised: 12/23/2013 Document Reviewed: 07/10/2013 [facility] Patient Information 2015 [place], [place]. This information is not intended to replace advice given to you by your health care provider. Make sure you discuss any questions you have with your health care provider.  Document Released: 08/05/2000 Document Revised: 08/13/2013 Document Reviewed: 03/20/2013 [facility] Patient Information 2015 [place], [place]. This information is not intended to replace advice given to you  by your health care provider. Make sure you discuss any questions you have with your health care provider.

## 2014-08-14 NOTE — Progress Notes (Signed)
Pre visit review using our clinic review tool, if applicable. No additional management support is needed unless otherwise documented below in the visit note. 

## 2014-08-16 ENCOUNTER — Encounter: Payer: Self-pay | Admitting: Internal Medicine

## 2014-08-16 DIAGNOSIS — M25519 Pain in unspecified shoulder: Secondary | ICD-10-CM | POA: Insufficient documentation

## 2014-08-16 DIAGNOSIS — Z716 Tobacco abuse counseling: Secondary | ICD-10-CM | POA: Insufficient documentation

## 2014-08-16 DIAGNOSIS — Z0000 Encounter for general adult medical examination without abnormal findings: Secondary | ICD-10-CM | POA: Insufficient documentation

## 2014-08-16 NOTE — Assessment & Plan Note (Signed)

## 2014-08-16 NOTE — Assessment & Plan Note (Signed)
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

## 2014-08-16 NOTE — Assessment & Plan Note (Signed)
Breast exam done and 3D mammogram ordered

## 2014-08-16 NOTE — Assessment & Plan Note (Signed)
Smoking cessation instruction/counseling given:  counseled patient on the dangers of tobacco use, advised patient to stop smoking, and reviewed strategies to maximize success 

## 2014-08-16 NOTE — Assessment & Plan Note (Signed)
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  She is requesting a new rx for the Zostavax ,  Which was given

## 2014-08-16 NOTE — Assessment & Plan Note (Signed)
Secondary to tobacco abuse,  She cannot afford flovent and albuterol MDIs.  [facility] application /referral for home duonebs in process

## 2014-08-16 NOTE — Assessment & Plan Note (Signed)
No fractures on prior evaluation.  Using [place] patches regularly.  Persistent chronic pain is likely a rotator cuff muscle tear or impingement from bone spurs.  Referral to Dr Hyacinth MeekerMiller for further evaluation.

## 2014-08-18 DIAGNOSIS — E559 Vitamin D deficiency, unspecified: Secondary | ICD-10-CM | POA: Insufficient documentation

## 2014-08-18 LAB — VITAMIN D 25 HYDROXY (VIT D DEFICIENCY, FRACTURES): VITD: 19.94 ng/mL — ABNORMAL LOW (ref 30.00–100.00)

## 2014-08-18 MED ORDER — ERGOCALCIFEROL 1.25 MG (50000 UT) PO CAPS: 50000.0000 [IU] | ORAL_CAPSULE | ORAL | Status: DC

## 2014-08-18 NOTE — Addendum Note (Signed)
Addended by: Sherlene ShamsULLO, Tashanti Dalporto L on: 08/18/2014 02:11 PM   Modules accepted: Orders

## 2014-08-18 NOTE — Assessment & Plan Note (Addendum)
Her 10 yr risk of CAD is 255.  LDL is 201.  Atorvastatin advised.   Lab Results  Component Value Date   CHOL 279* 08/14/2014   HDL 49.60 08/14/2014   LDLCALC 201* 08/14/2014   LDLDIRECT 188.9 05/29/2013   TRIG 140.0 08/14/2014   CHOLHDL 6 08/14/2014

## 2014-08-19 ENCOUNTER — Encounter: Payer: Self-pay | Admitting: *Deleted

## 2014-08-19 DIAGNOSIS — Z79899 Other long term (current) drug therapy: Secondary | ICD-10-CM | POA: Insufficient documentation

## 2014-08-19 MED ORDER — ATORVASTATIN CALCIUM 20 MG PO TABS: 20.0000 mg | ORAL_TABLET | Freq: Every day | ORAL | Status: DC

## 2014-08-19 NOTE — Addendum Note (Signed)
Addended by: Sherlene ShamsULLO, Eden Toohey L on: 08/19/2014 10:25 AM   Modules accepted: Orders

## 2014-08-20 ENCOUNTER — Other Ambulatory Visit: Payer: Self-pay | Admitting: Internal Medicine

## 2014-08-31 ENCOUNTER — Other Ambulatory Visit: Payer: Self-pay | Admitting: Internal Medicine

## 2014-09-07 ENCOUNTER — Telehealth: Payer: Self-pay | Admitting: Internal Medicine

## 2014-09-07 NOTE — Telephone Encounter (Signed)
Disability form from [employer] put in folder. All office notes meed to be sent as well ans the [facility]'s [facility] which are available through [facility].   The charge for completing the disability form is unclear since I  do not have a copy of our [place] form  tht has the charges on it,

## 2014-09-09 DIAGNOSIS — Z7689 Persons encountering health services in other specified circumstances: Secondary | ICD-10-CM

## 2014-09-09 NOTE — Telephone Encounter (Signed)
FMLA faxed and sent scan, also copy for billing.

## 2014-09-15 ENCOUNTER — Telehealth: Payer: Self-pay | Admitting: *Deleted

## 2014-09-15 NOTE — Telephone Encounter (Signed)
Need payment for form.

## 2014-11-21 ENCOUNTER — Other Ambulatory Visit: Payer: Self-pay | Admitting: Internal Medicine

## 2014-12-15 ENCOUNTER — Telehealth: Payer: Self-pay

## 2014-12-15 DIAGNOSIS — H8122 Vestibular neuronitis, left ear: Secondary | ICD-10-CM

## 2014-12-15 DIAGNOSIS — H908 Mixed conductive and sensorineural hearing loss, unspecified: Secondary | ICD-10-CM

## 2014-12-15 NOTE — Telephone Encounter (Signed)
The patient called and is hoping to get a referral to Dr.Bennett's office (ENT)  Callback - [phone]

## 2014-12-15 NOTE — Telephone Encounter (Signed)
What for?  Have to have a reason.

## 2014-12-30 ENCOUNTER — Encounter: Payer: Self-pay | Admitting: Internal Medicine

## 2014-12-30 ENCOUNTER — Ambulatory Visit (INDEPENDENT_AMBULATORY_CARE_PROVIDER_SITE_OTHER): Payer: 59 | Admitting: Internal Medicine

## 2014-12-30 VITALS — BP 168/88 | HR 81 | Temp 97.8°F | Resp 14 | Ht 58.5 in | Wt 175.2 lb

## 2014-12-30 DIAGNOSIS — N3010 Interstitial cystitis (chronic) without hematuria: Secondary | ICD-10-CM | POA: Diagnosis not present

## 2014-12-30 DIAGNOSIS — H269 Unspecified cataract: Secondary | ICD-10-CM

## 2014-12-30 DIAGNOSIS — I10 Essential (primary) hypertension: Secondary | ICD-10-CM

## 2014-12-30 DIAGNOSIS — H8122 Vestibular neuronitis, left ear: Secondary | ICD-10-CM | POA: Diagnosis not present

## 2014-12-30 DIAGNOSIS — H908 Mixed conductive and sensorineural hearing loss, unspecified: Secondary | ICD-10-CM

## 2014-12-30 DIAGNOSIS — Z716 Tobacco abuse counseling: Secondary | ICD-10-CM

## 2014-12-30 MED ORDER — VARENICLINE TARTRATE 0.5 MG X 11 & 1 MG X 42 PO MISC: ORAL | Status: DC

## 2014-12-30 NOTE — Progress Notes (Signed)
Patient ID: Patricia KinnierDiane R Held, female   DOB: 01-06-1956, 59 y.o.   MRN: [id number]  Patient Active Problem List   Diagnosis Date Noted  . Cataract of both eyes 12/31/2014  . Long-term use of high-risk medication 08/19/2014  . Hypovitaminosis D 08/18/2014  . Encounter for preventive health examination 08/16/2014  . Pain in joint, shoulder region 08/16/2014  . Tobacco abuse counseling 08/16/2014  . Gait instability 04/03/2014  . 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 Hearing loss, 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/2013  . History of shingles 08/30/2013  . Generalized anxiety disorder 05/29/2013  . Obesity 03/19/2012  . Breast cancer screening 03/19/2012  . History of nephrolithiasis   . Interstitial cystitis   . COPD (chronic obstructive pulmonary disease) 10/16/2011  . Hypertension   . Hyperlipidemia   . 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 Tobacco abuse 07/25/2011    Subjective:  CC:   Chief Complaint  Patient presents with  . Follow-up    Referral for [facility]. Patient needs referral Dr. Ronni RumbleMukeriee    HPI:   Patricia Fields is a 59 y.o. female who presents for  Follow up on chronic issues including vestibular neuritis, loss of hearing secondary to shingles, interstitial cystitis and tobacco abuse .   Patient has received a jury duty summons for may 25th and is requesting a letter of excusal based on her history  of interstitial cystitis .  Patient has urinary frequency that is not controlled with medications and results in urgency and discomfort  if not managed with frequent bathroom voids.   Wants to quit smoking.  Has tried methods of quitting cold turikey and reducing gradually neither of which was successful.  Currently Smoking a pack a day,   Was down to half pack daily  Smokes all day,  Especially after eating  No prior trials of medications , but is taking wellbutrin for depression and it has not helped the tobacco  cravings.    Past Medical History  Diagnosis Date  . Peripheral arterial disease Feb 2011    by renal artery duplex  . Hypertension   . Hyperlipidemia   . 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 History of nephrolithiasis   . Interstitial cystitis   . Tobacco abuse     one pack daily    Past Surgical History  Procedure Laterality Date  . Total abdominal hysterectomy w/ bilateral salpingoophorectomy  1992    due to uterine tumor, and endometriosis   . Arthroscopy      left knee , remote  . Breast surgery      left breast cyst, benign  . Carpal tunnel release      Dr. Hyacinth MeekerMiller,  right hand   . Abdominal hysterectomy         The following portions of the patient's history were reviewed and updated as appropriate: Allergies, current medications, and problem list.    Review of Systems:   Patient denies headache, fevers, malaise, unintentional weight loss, skin rash, eye pain, sinus congestion and sinus pain, sore throat, dysphagia,  hemoptysis , cough, dyspnea, wheezing, chest pain, palpitations, orthopnea, edema, abdominal pain, nausea, melena, diarrhea, constipation, flank pain, dysuria, hematuria, urinary  Frequency, nocturia, numbness, tingling, seizures,  Focal weakness, Loss of consciousness,  Tremor, insomnia, depression, anxiety, and suicidal ideation.     History   Social History  . Marital Status: Single    Spouse Name: N/A  .  Number of Children: N/A  . Years of Education: N/A   Occupational History  . Not on file.   Social History Main Topics  . Smoking status: Current Every Day Smoker -- 1.00 packs/day    Types: Cigarettes  . Smokeless tobacco: Never Used  . Alcohol Use: No  . Drug Use: No  . Sexual Activity: Not Currently   Other Topics Concern  . Not on file   Social History Narrative    Objective:  Filed Vitals:   12/30/14 1356  BP: 168/88  Pulse: 81  Temp: 97.8 F (36.6 C)  Resp: 14     General appearance:  alert, cooperative and appears stated age Ears: normal TM's and external ear canals both ears Throat: lips, mucosa, and tongue normal; teeth and gums normal Neck: no adenopathy, no carotid bruit, supple, symmetrical, trachea midline and thyroid not enlarged, symmetric, no tenderness/mass/nodules Back: symmetric, no curvature. ROM normal. No CVA tenderness. Lungs: clear to auscultation bilaterally Heart: regular rate and rhythm, S1, S2 normal, no murmur, click, rub or gallop Abdomen: soft, non-tender; bowel sounds normal; no masses,  no organomegaly Pulses: 2+ and symmetric Skin: Skin color, texture, turgor normal. No rashes or lesions Lymph nodes: Cervical, supraclavicular, and axillary nodes normal.  Assessment and Plan:  Hypertension Elevated today.  Reviewed list of meds, patient is not taking OTC meds that could be causing,. It.  Have asked patient to recheck bp at home a minimum of 5 times over the next 4 weeks and call readings to office for adjustment of medications.     Interstitial cystitis i have written a letter to appeal her jury duty summons based on her history of interstitial cystitis.   Tobacco abuse counseling Chantix discussed regarding MOA, side effects profile.  Patient requesting trial of Chantix.    Hearing loss, mixed conductive and sensorineural She needs referral to [facility] for follow up.    Cataract of both eyes Referral to [facility] made.      Updated Medication List Outpatient Encounter Prescriptions as of 12/30/2014  Medication Sig  . aspirin 81 MG tablet Take 81 mg by mouth daily.    [name] atorvastatin (LIPITOR) 20 MG tablet Take 1 tablet (20 mg total) by mouth daily.  [name] b complex vitamins capsule Take 1 capsule by mouth daily.  [name] buPROPion (WELLBUTRIN SR) 150 MG 12 hr tablet Take 1 tablet (150 mg total) by mouth 2 (two) times daily.  . Cholecalciferol (VITAMIN D3) 1000 UNITS CAPS Take by mouth.    . ergocalciferol (DRISDOL) 50000 UNITS  capsule Take 1 capsule (50,000 Units total) by mouth once a week.  . Fluticasone-Salmeterol (ADVAIR) 250-50 MCG/DOSE AEPB Inhale 1 puff into the lungs 2 (two) times daily.  [name] gabapentin (NEURONTIN) 100 MG capsule Take 1 capsule (100 mg total) by mouth at bedtime. 1 pill nightly at bedtime as needed for L ear pain  . hydrOXYzine (ATARAX/VISTARIL) 10 MG tablet Use once daily as needed for L ear itch.  . lisinopril-hydrochlorothiazide (PRINZIDE,ZESTORETIC) 20-25 MG per tablet TAKE ONE TABLET BY MOUTH ONCE DAILY  . lisinopril-hydrochlorothiazide (PRINZIDE,ZESTORETIC) 20-25 MG per tablet TAKE ONE TABLET BY MOUTH ONCE DAILY  . Meclizine HCl 25 MG CHEW Chew 1 tablet (25 mg total) by mouth 3 (three) times daily before meals. For nausea or vertigo  . metoprolol succinate (TOPROL-XL) 25 MG 24 hr tablet TAKE ONE TABLET BY MOUTH ONCE DAILY  . ondansetron (ZOFRAN) 8 MG tablet Take 1 tablet (8 mg total) by mouth every  8 (eight) hours as needed for nausea or vomiting.  [name]. PARoxetine (PAXIL) 10 MG tablet TAKE ONE TABLET BY MOUTH IN THE MORNING  . phenazopyridine (PYRIDIUM) 200 MG tablet Take 1 tablet (200 mg total) by mouth 3 (three) times daily as needed for pain.  . potassium chloride SA (K-DUR,KLOR-CON) 20 MEQ tablet Take 1 tablet (20 mEq total) by mouth daily.  . traMADol (ULTRAM) 50 MG tablet Take 1 tablet (50 mg total) by mouth every 6 (six) hours as needed.  . vitamin B-12 (CYANOCOBALAMIN) 500 MCG tablet Take 500 mcg by mouth daily.    . vitamin E 100 UNIT capsule Take 100 Units by mouth daily.  [name]. zoster vaccine live, PF, (ZOSTAVAX) [id number] UNT/0.65ML injection Inject 19,400 Units into the skin once.  . [DISCONTINUED] cefUROXime (CEFTIN) 250 MG tablet Take 1 tablet (250 mg total) by mouth 2 (two) times daily with a meal.  . varenicline (CHANTIX STARTING MONTH PAK) 0.5 MG X 11 & 1 MG X 42 tablet Take one 0.5 mg tablet by mouth once daily for 3 days, then increase to one 0.5 mg tablet twice daily for 4 days, then  increase to one 1 mg tablet twice daily.   No facility-administered encounter medications on file as of 12/30/2014.     Orders Placed This Encounter  Procedures  . Ambulatory referral to ENT  . Ambulatory referral to Ophthalmology    Return in about 4 weeks (around 01/27/2015).

## 2014-12-30 NOTE — Patient Instructions (Signed)
Varenicline oral tablets What is this medicine? VARENICLINE (var EN i kleen) is used to help people quit smoking. It can reduce the symptoms caused by stopping smoking. It is used with a patient support program recommended by your physician. This medicine may be used for other purposes; ask your health care provider or pharmacist if you have questions. COMMON BRAND NAME(S): Chantix What should I tell my health care provider before I take this medicine? They need to know if you have any of these conditions: -bipolar disorder, depression, schizophrenia or other mental illness -heart disease -if you often drink alcohol -kidney disease -peripheral vascular disease -seizures -stroke -suicidal thoughts, plans, or attempt; a previous suicide attempt by you or a family member -an unusual or allergic reaction to varenicline, other medicines, foods, dyes, or preservatives -pregnant or trying to get pregnant -breast-feeding How should I use this medicine? You should set a date to stop smoking and tell your doctor. Start this medicine one week before the quit date. You can also start taking this medicine before you choose a quit date, and then pick a quit date that is between 8 and 35 days of treatment with this medicine. Stick to your plan; ask about support groups or other ways to help you remain a 'quitter'. Take this medicine by mouth after eating. Take with a full glass of water. Follow the directions on the prescription label. Take your doses at regular intervals. Do not take your medicine more often than directed. A special MedGuide will be given to you by the pharmacist with each prescription and refill. Be sure to read this information carefully each time. Talk to your pediatrician regarding the use of this medicine in children. This medicine is not approved for use in children. Overdosage: If you think you have taken too much of this medicine contact a poison control center or emergency room at  once. NOTE: This medicine is only for you. Do not share this medicine with others. What if I miss a dose? If you miss a dose, take it as soon as you can. If it is almost time for your next dose, take only that dose. Do not take double or extra doses. What may interact with this medicine? -alcohol or any product that contains alcohol -insulin -other stop smoking aids -theophylline -warfarin This list may not describe all possible interactions. Give your health care provider a list of all the medicines, herbs, non-prescription drugs, or dietary supplements you use. Also tell them if you smoke, drink alcohol, or use illegal drugs. Some items may interact with your medicine. What should I watch for while using this medicine? Visit your doctor or health care professional for regular check ups. Ask for ongoing advice and encouragement from your doctor or healthcare professional, friends, and family to help you quit. If you smoke while on this medication, quit again Your mouth may get dry. Chewing sugarless gum or sucking hard candy, and drinking plenty of water may help. Contact your doctor if the problem does not go away or is severe. You may get drowsy or dizzy. Do not drive, use machinery, or do anything that needs mental alertness until you know how this medicine affects you. Do not stand or sit up quickly, especially if you are an older patient. This reduces the risk of dizzy or fainting spells. The use of this medicine may increase the chance of suicidal thoughts or actions. Pay special attention to how you are responding while on this medicine. Any worsening of   mood, or thoughts of suicide or dying should be reported to your health care professional right away. What side effects may I notice from receiving this medicine? Side effects that you should report to your doctor or health care professional as soon as possible: -allergic reactions like skin rash, itching or hives, swelling of the face,  lips, tongue, or throat -breathing problems -changes in vision -chest pain or chest tightness -confusion, trouble speaking or understanding -fast, irregular heartbeat -feeling faint or lightheaded, falls -fever -pain in legs when walking -problems with balance, talking, walking -redness, blistering, peeling or loosening of the skin, including inside the mouth -ringing in ears -seizures -sudden numbness or weakness of the face, arm or leg -suicidal thoughts or other mood changes -trouble passing urine or change in the amount of urine -unusual bleeding or bruising -unusually weak or tired Side effects that usually do not require medical attention (report to your doctor or health care professional if they continue or are bothersome): -constipation -headache -nausea, vomiting -strange dreams -stomach gas -trouble sleeping This list may not describe all possible side effects. Call your doctor for medical advice about side effects. You may report side effects to FDA at 1-800-FDA-1088. Where should I keep my medicine? Keep out of the reach of children. Store at room temperature between 15 and 30 degrees C (59 and 86 degrees F). Throw away any unused medicine after the expiration date. NOTE: This sheet is a summary. It may not cover all possible information. If you have questions about this medicine, talk to your doctor, pharmacist, or health care provider.  2015, Elsevier/Gold Standard. (2013-05-20 13:37:47)  

## 2014-12-30 NOTE — Progress Notes (Signed)
Pre-visit discussion using our clinic review tool. No additional management support is needed unless otherwise documented below in the visit note.  

## 2014-12-31 ENCOUNTER — Encounter: Payer: Self-pay | Admitting: Internal Medicine

## 2014-12-31 DIAGNOSIS — H269 Unspecified cataract: Secondary | ICD-10-CM | POA: Insufficient documentation

## 2014-12-31 NOTE — Assessment & Plan Note (Signed)
Referral to [facility] made.

## 2014-12-31 NOTE — Assessment & Plan Note (Signed)
Elevated today.  Reviewed list of meds, patient is not taking OTC meds that could be causing,. It.  Have asked patient to recheck bp at home a minimum of 5 times over the next 4 weeks and call readings to office for adjustment of medications.   

## 2014-12-31 NOTE — Assessment & Plan Note (Signed)
i have written a letter to appeal her jury duty summons based on her history of interstitial cystitis.

## 2014-12-31 NOTE — Assessment & Plan Note (Signed)
Chantix discussed regarding MOA, side effects profile.  Patient requesting trial of Chantix.

## 2014-12-31 NOTE — Assessment & Plan Note (Signed)
She needs referral to [facility] for follow up.

## 2015-01-01 ENCOUNTER — Telehealth: Payer: Self-pay | Admitting: *Deleted

## 2015-01-01 NOTE — Telephone Encounter (Signed)
[facility] [place] through 01/01/16.

## 2015-01-01 NOTE — Telephone Encounter (Signed)
Fax from pharmacy, needing [place]  For Chantix. Started online, pending response.

## 2015-01-02 NOTE — Telephone Encounter (Signed)
The pt is seeing Natale LayDr.Nisha Mukherjee for cataract problems on June 10th and needs a [place] ref  Thanks!

## 2015-02-11 ENCOUNTER — Ambulatory Visit (INDEPENDENT_AMBULATORY_CARE_PROVIDER_SITE_OTHER): Payer: 59 | Admitting: Internal Medicine

## 2015-02-11 ENCOUNTER — Encounter: Payer: Self-pay | Admitting: Internal Medicine

## 2015-02-11 VITALS — BP 160/90 | HR 62 | Temp 98.5°F | Resp 14 | Ht 59.0 in | Wt 171.5 lb

## 2015-02-11 DIAGNOSIS — Z716 Tobacco abuse counseling: Secondary | ICD-10-CM

## 2015-02-11 DIAGNOSIS — Z720 Tobacco use: Secondary | ICD-10-CM | POA: Diagnosis not present

## 2015-02-11 DIAGNOSIS — I10 Essential (primary) hypertension: Secondary | ICD-10-CM

## 2015-02-11 DIAGNOSIS — Z889 Allergy status to unspecified drugs, medicaments and biological substances status: Secondary | ICD-10-CM

## 2015-02-11 DIAGNOSIS — E785 Hyperlipidemia, unspecified: Secondary | ICD-10-CM

## 2015-02-11 DIAGNOSIS — Z789 Other specified health status: Secondary | ICD-10-CM

## 2015-02-11 DIAGNOSIS — Z79899 Other long term (current) drug therapy: Secondary | ICD-10-CM | POA: Diagnosis not present

## 2015-02-11 DIAGNOSIS — J438 Other emphysema: Secondary | ICD-10-CM

## 2015-02-11 LAB — COMPREHENSIVE METABOLIC PANEL
ALK PHOS: 104 U/L (ref 39–117)
ALT: 20 U/L (ref 0–35)
AST: 14 U/L (ref 0–37)
Albumin: 4.1 g/dL (ref 3.5–5.2)
BUN: 13 mg/dL (ref 6–23)
CO2: 30 mEq/L (ref 19–32)
Calcium: 9.7 mg/dL (ref 8.4–10.5)
Chloride: 103 mEq/L (ref 96–112)
Creatinine, Ser: 1.02 mg/dL (ref 0.40–1.20)
GFR: 58.78 mL/min — ABNORMAL LOW (ref >60.00)
Glucose, Bld: 116 mg/dL — ABNORMAL HIGH (ref 70–99)
POTASSIUM: 3.6 meq/L (ref 3.5–5.1)
Sodium: 141 mEq/L (ref 135–145)
Total Bilirubin: 0.5 mg/dL (ref 0.2–1.2)
Total Protein: 6.9 g/dL (ref 6.0–8.3)

## 2015-02-11 LAB — LIPID PANEL
Cholesterol: 277 mg/dL — ABNORMAL HIGH (ref 0–200)
HDL: 44.2 mg/dL (ref >39.00)
NONHDL: 232.8
Total CHOL/HDL Ratio: 6
Triglycerides: 230 mg/dL — ABNORMAL HIGH (ref 0.0–149.0)
VLDL: 46 mg/dL — ABNORMAL HIGH (ref 0.0–40.0)

## 2015-02-11 LAB — LDL CHOLESTEROL, DIRECT: Direct LDL: 163 mg/dL

## 2015-02-11 MED ORDER — VARENICLINE TARTRATE 0.5 MG X 11 & 1 MG X 42 PO MISC: ORAL | Status: DC

## 2015-02-11 MED ORDER — LOSARTAN POTASSIUM-HCTZ 100-25 MG PO TABS: 1.0000 | ORAL_TABLET | Freq: Every day | ORAL | Status: DC

## 2015-02-11 NOTE — Assessment & Plan Note (Signed)
Has reduced use to 1/2 pack daily.  Resume chantix

## 2015-02-11 NOTE — Progress Notes (Signed)
Subjective:  Patient ID: Patricia Fields, female    DOB: 10/31/1955  Age: 59 y.o. MRN: [id number]  CC: The primary encounter diagnosis was Tobacco abuse. Diagnoses of Long-term use of high-risk medication, Hyperlipemia, Essential hypertension, Other emphysema, Tobacco abuse counseling, Hyperlipidemia, and Statin intolerance were also pertinent to this visit.  HPI Patricia Fields presents for follow up on smoking cessation, hypertension, and COP. . Patient was prescribed chantix at last visit. She has been forgetting to take both doses. She has tolerated the medication without side effects. And has reduced her tobacco use to as cut back to 4 packs per week, or roughly 1/2 pack daily.    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  In July she is also going to have cataract surgery .   She is taking her medications as directed.   Outpatient Prescriptions Prior to Visit  Medication Sig Dispense Refill  . aspirin 81 MG tablet Take 81 mg by mouth daily.      [name] b complex vitamins capsule Take 1 capsule by mouth daily.    [name] buPROPion (WELLBUTRIN SR) 150 MG 12 hr tablet Take 1 tablet (150 mg total) by mouth 2 (two) times daily. 180 tablet 0  . Cholecalciferol (VITAMIN D3) 1000 UNITS CAPS Take by mouth.      . ergocalciferol (DRISDOL) 50000 UNITS capsule Take 1 capsule (50,000 Units total) by mouth once a week. 12 capsule 0  . Fluticasone-Salmeterol (ADVAIR) 250-50 MCG/DOSE AEPB Inhale 1 puff into the lungs 2 (two) times daily. 60 each 11  . gabapentin (NEURONTIN) 100 MG capsule Take 1 capsule (100 mg total) by mouth at bedtime. 1 pill nightly at bedtime as needed for L ear pain 30 capsule 5  . hydrOXYzine (ATARAX/VISTARIL) 10 MG tablet Use once daily as needed for L ear itch. 30 tablet 3  . Meclizine HCl 25 MG CHEW Chew 1 tablet (25 mg total) by mouth 3 (three) times daily before meals. For nausea or vertigo 30 each  0  . metoprolol succinate (TOPROL-XL) 25 MG 24 hr tablet TAKE ONE TABLET BY MOUTH ONCE DAILY 90 tablet 1  . ondansetron (ZOFRAN) 8 MG tablet Take 1 tablet (8 mg total) by mouth every 8 (eight) hours as needed for nausea or vomiting. 30 tablet 0  . PARoxetine (PAXIL) 10 MG tablet TAKE ONE TABLET BY MOUTH IN THE MORNING 90 tablet 0  . phenazopyridine (PYRIDIUM) 200 MG tablet Take 1 tablet (200 mg total) by mouth 3 (three) times daily as needed for pain. 6 tablet 0  . potassium chloride SA (K-DUR,KLOR-CON) 20 MEQ tablet Take 1 tablet (20 mEq total) by mouth daily. 30 tablet 3  . traMADol (ULTRAM) 50 MG tablet Take 1 tablet (50 mg total) by mouth every 6 (six) hours as needed. 120 tablet 2  . vitamin B-12 (CYANOCOBALAMIN) 500 MCG tablet Take 500 mcg by mouth daily.      . vitamin E 100 UNIT capsule Take 100 Units by mouth daily.    [name] zoster vaccine live, PF, (ZOSTAVAX) 40981 UNT/0.65ML injection Inject 19,400 Units into the skin once. 1 each 0  . lisinopril-hydrochlorothiazide (PRINZIDE,ZESTORETIC) 20-25 MG per tablet TAKE ONE TABLET BY MOUTH ONCE DAILY 30 tablet 4  . lisinopril-hydrochlorothiazide (PRINZIDE,ZESTORETIC) 20-25 MG per tablet TAKE ONE TABLET BY MOUTH ONCE DAILY 30 tablet 6  . varenicline (CHANTIX STARTING MONTH PAK) 0.5 MG X 11 & 1  MG X 42 tablet Take one 0.5 mg tablet by mouth once daily for 3 days, then increase to one 0.5 mg tablet twice daily for 4 days, then increase to one 1 mg tablet twice daily. 53 tablet 0  . atorvastatin (LIPITOR) 20 MG tablet Take 1 tablet (20 mg total) by mouth daily. (Patient not taking: Reported on 02/11/2015) 90 tablet 3   No facility-administered medications prior to visit.    Review of Systems;  Patient denies headache, fevers, malaise, unintentional weight loss, skin rash, eye pain, sinus congestion and sinus pain, sore throat, dysphagia,  hemoptysis , cough, dyspnea, wheezing, chest pain, palpitations, orthopnea, edema, abdominal pain, nausea, melena,  diarrhea, constipation, flank pain, dysuria, hematuria, urinary  Frequency, nocturia, numbness, tingling, seizures,  Focal weakness, Loss of consciousness,  Tremor, insomnia, depression, anxiety, and suicidal ideation.      Objective:  BP 160/90 mmHg  Pulse 62  Temp(Src) 98.5 F (36.9 C) (Oral)  Resp 14  Ht  (1.499 m)  Wt 171 lb 8 oz (77.792 kg)  BMI 34.62 kg/m2  SpO2 98%  BP Readings from Last 3 Encounters:  02/11/15 160/90  12/30/14 168/88  08/14/14 136/84    Wt Readings from Last 3 Encounters:  02/11/15 171 lb 8 oz (77.792 kg)  12/30/14 175 lb 4 oz (79.493 kg)  08/14/14 167 lb 8 oz (75.978 kg)    General appearance: alert, cooperative and appears stated age Ears: normal TM's and external ear canals both ears Throat: lips, mucosa, and tongue normal; teeth and gums normal Neck: no adenopathy, no carotid bruit, supple, symmetrical, trachea midline and thyroid not enlarged, symmetric, no tenderness/mass/nodules Back: symmetric, no curvature. ROM normal. No CVA tenderness. Lungs: clear to auscultation bilaterally Heart: regular rate and rhythm, S1, S2 normal, no murmur, click, rub or gallop Abdomen: soft, non-tender; bowel sounds normal; no masses,  no organomegaly Pulses: 2+ and symmetric Skin: Skin color, texture, turgor normal. No rashes or lesions Lymph nodes: Cervical, supraclavicular, and axillary nodes normal.  Lab Results  Component Value Date   HGBA1C 6.0 01/25/2012    Lab Results  Component Value Date   CREATININE 1.02 02/11/2015   CREATININE 1.0 07/21/2014   CREATININE 1.1 03/14/2014    Lab Results  Component Value Date   WBC 8.5 08/14/2014   HGB 14.6 08/14/2014   HCT 43.1 08/14/2014   PLT 293.0 08/14/2014   GLUCOSE 116* 02/11/2015   CHOL 277* 02/11/2015   TRIG 230.0* 02/11/2015   HDL 44.20 02/11/2015   LDLDIRECT 163.0 02/11/2015   LDLCALC 201* 08/14/2014   ALT 20 02/11/2015   AST 14 02/11/2015   NA 141 02/11/2015   K 3.6 02/11/2015    CL 103 02/11/2015   CREATININE 1.02 02/11/2015   BUN 13 02/11/2015   CO2 30 02/11/2015   TSH 1.26 08/14/2014   HGBA1C 6.0 01/25/2012    No results found.  Assessment & Plan:   Problem List Items Addressed This Visit    Hypertension    Elevated today.  Reviewed list of meds, patient is not taking OTC meds that could be causing it,  Changing lisinopril to lisinopril hct       Relevant Medications   losartan-hydrochlorothiazide (HYZAAR) 100-25 MG per tablet   Hyperlipidemia    Her  Untreated 10 yr risk of CAD is 25%.  LDL has improved from 201 to 169 without statin,   She did not tolerate atorvastatin    Lab Results  Component Value Date   CHOL  277* 02/11/2015   HDL 44.20 02/11/2015   LDLCALC 201* 08/14/2014   LDLDIRECT 163.0 02/11/2015   TRIG 230.0* 02/11/2015   CHOLHDL 6 02/11/2015           Relevant Medications   losartan-hydrochlorothiazide (HYZAAR) 100-25 MG per tablet   COPD (chronic obstructive pulmonary disease)    Secondary to tobacco abuse,  Asymptomatic at rest.  Continue Advair .       Relevant Medications   varenicline (CHANTIX STARTING MONTH PAK) 0.5 MG X 11 & 1 MG X 42 tablet   Tobacco abuse counseling    Smoking cessation instruction/counseling given:  counseled patient on the dangers of tobacco use, advised patient to stop smoking, and reviewed strategies to maximize success      Statin intolerance   Tobacco abuse - Primary    Has reduced use to 1/2 pack daily.  Resume chantix       Relevant Medications   varenicline (CHANTIX STARTING MONTH PAK) 0.5 MG X 11 & 1 MG X 42 tablet   Long-term use of high-risk medication    Other Visit Diagnoses    Hyperlipemia        Relevant Medications    losartan-hydrochlorothiazide (HYZAAR) 100-25 MG per tablet       I have discontinued Ms. Garlock's atorvastatin, lisinopril-hydrochlorothiazide, and lisinopril-hydrochlorothiazide. I am also having her start on losartan-hydrochlorothiazide. Additionally, I am  having her maintain her Vitamin D3, vitamin B-12, aspirin, vitamin E, ondansetron, Meclizine HCl, b complex vitamins, gabapentin, hydrOXYzine, potassium chloride SA, phenazopyridine, buPROPion, traMADol, zoster vaccine live (PF), Fluticasone-Salmeterol, ergocalciferol, metoprolol succinate, PARoxetine, and varenicline.  Meds ordered this encounter  Medications  . varenicline (CHANTIX STARTING MONTH PAK) 0.5 MG X 11 & 1 MG X 42 tablet    Sig: Take one 0.5 mg tablet by mouth once daily for 3 days, then increase to one 0.5 mg tablet twice daily for 4 days, then increase to one 1 mg tablet twice daily.    Dispense:  53 tablet    Refill:  0  . losartan-hydrochlorothiazide (HYZAAR) 100-25 MG per tablet    Sig: Take 1 tablet by mouth daily.    Dispense:  30 tablet    Refill:  5    Medications Discontinued During This Encounter  Medication Reason  . varenicline (CHANTIX STARTING MONTH PAK) 0.5 MG X 11 & 1 MG X 42 tablet Reorder  . lisinopril-hydrochlorothiazide (PRINZIDE,ZESTORETIC) 20-25 MG per tablet   . atorvastatin (LIPITOR) 20 MG tablet   . lisinopril-hydrochlorothiazide (PRINZIDE,ZESTORETIC) 20-25 MG per tablet     Follow-up: Return in about 4 weeks (around 03/11/2015).   Sherlene Shams, MD

## 2015-02-11 NOTE — Progress Notes (Signed)
Pre-visit discussion using our clinic review tool. No additional management support is needed unless otherwise documented below in the visit note.  

## 2015-02-11 NOTE — Patient Instructions (Signed)
Please resume the CHANTIX  TO HELP YOU QUIT SMOKING  Ask your daughter in law to remind you to take the afternoon dose  Set your quit date for the end of the first week and let your family know    I am changing your blood pressure mediation because you are not at goal (a good blood pressure is bvelow 130.80)  Start the losartan/hct daily and STOP the lisinopril  Return in one month

## 2015-02-15 DIAGNOSIS — Z789 Other specified health status: Secondary | ICD-10-CM | POA: Insufficient documentation

## 2015-02-15 NOTE — Assessment & Plan Note (Signed)
Smoking cessation instruction/counseling given:  counseled patient on the dangers of tobacco use, advised patient to stop smoking, and reviewed strategies to maximize success 

## 2015-02-15 NOTE — Assessment & Plan Note (Addendum)
Elevated today.  Reviewed list of meds, patient is not taking OTC meds that could be causing it,  Changing lisinopril to lisinopril hct

## 2015-02-15 NOTE — Assessment & Plan Note (Addendum)
Her  Untreated 10 yr risk of CAD is 25%.  LDL has improved from 201 to 169 without statin,   She did not tolerate atorvastatin    Lab Results  Component Value Date   CHOL 277* 02/11/2015   HDL 44.20 02/11/2015   LDLCALC 201* 08/14/2014   LDLDIRECT 163.0 02/11/2015   TRIG 230.0* 02/11/2015   CHOLHDL 6 02/11/2015

## 2015-02-15 NOTE — Assessment & Plan Note (Signed)
Secondary to tobacco abuse,  Asymptomatic at rest.  Continue Advair .

## 2015-02-19 ENCOUNTER — Encounter
Admission: RE | Admit: 2015-02-19 | Discharge: 2015-02-19 | Disposition: A | Discharge status: Home / Self Care | Payer: 59 | Source: Ambulatory Visit | Attending: Ophthalmology | Admitting: Ophthalmology

## 2015-02-19 ENCOUNTER — Encounter: Payer: Self-pay | Admitting: Internal Medicine

## 2015-02-19 DIAGNOSIS — Z01812 Encounter for preprocedural laboratory examination: Secondary | ICD-10-CM | POA: Diagnosis not present

## 2015-02-19 DIAGNOSIS — Z01810 Encounter for preprocedural cardiovascular examination: Secondary | ICD-10-CM | POA: Insufficient documentation

## 2015-02-19 LAB — POTASSIUM: POTASSIUM: 3.1 mmol/L — AB (ref 3.5–5.1)

## 2015-02-23 ENCOUNTER — Telehealth: Payer: Self-pay | Admitting: Internal Medicine

## 2015-02-23 MED ORDER — POTASSIUM CHLORIDE CRYS ER 20 MEQ PO TBCR: 20.0000 meq | EXTENDED_RELEASE_TABLET | Freq: Two times a day (BID) | ORAL | Status: DC

## 2015-02-23 NOTE — Telephone Encounter (Signed)
Her potassium was low at 3.1 on recent preoperative labs June 30th.  If the ordering physician did not address,  Let me know.  She needs to start taking  potassium chloride one tablet daily .  rx sent

## 2015-02-24 MED ORDER — SIMVASTATIN 20 MG PO TABS: 20.0000 mg | ORAL_TABLET | Freq: Every day | ORAL | Status: DC

## 2015-02-24 NOTE — Telephone Encounter (Signed)
Simvastatin sent.  My notes state that she is statin intolerant, but if she is willing to try again  , I will send it

## 2015-02-24 NOTE — Telephone Encounter (Signed)
Pt notified and  verbalized understanding. States a Rx for Simvastatin was supposed to have been sent in per recent lab note 02/11/15, Rx has not been sent yet, to [facility].

## 2015-02-26 NOTE — OR Nursing (Signed)
Contacted Cindy at Dr [facility] office in regards to 3.1 potassium level she stated they were aware and have forwarded information to patient PMD. Repeat ordered for DOS.

## 2015-03-02 ENCOUNTER — Encounter: Payer: Self-pay | Admitting: *Deleted

## 2015-03-02 DIAGNOSIS — M5090 Cervical disc disorder, unspecified, unspecified cervical region: Secondary | ICD-10-CM | POA: Diagnosis not present

## 2015-03-02 DIAGNOSIS — N189 Chronic kidney disease, unspecified: Secondary | ICD-10-CM | POA: Diagnosis not present

## 2015-03-02 DIAGNOSIS — I129 Hypertensive chronic kidney disease with stage 1 through stage 4 chronic kidney disease, or unspecified chronic kidney disease: Secondary | ICD-10-CM | POA: Diagnosis not present

## 2015-03-02 DIAGNOSIS — I739 Peripheral vascular disease, unspecified: Secondary | ICD-10-CM | POA: Diagnosis not present

## 2015-03-02 DIAGNOSIS — H2511 Age-related nuclear cataract, right eye: Secondary | ICD-10-CM | POA: Diagnosis not present

## 2015-03-02 DIAGNOSIS — F17210 Nicotine dependence, cigarettes, uncomplicated: Secondary | ICD-10-CM | POA: Diagnosis not present

## 2015-03-02 DIAGNOSIS — E785 Hyperlipidemia, unspecified: Secondary | ICD-10-CM | POA: Diagnosis not present

## 2015-03-02 DIAGNOSIS — H8109 Meniere's disease, unspecified ear: Secondary | ICD-10-CM | POA: Diagnosis not present

## 2015-03-02 DIAGNOSIS — J449 Chronic obstructive pulmonary disease, unspecified: Secondary | ICD-10-CM | POA: Diagnosis not present

## 2015-03-02 DIAGNOSIS — I252 Old myocardial infarction: Secondary | ICD-10-CM | POA: Diagnosis not present

## 2015-03-04 MED ORDER — TETRACAINE HCL 0.5 % OP SOLN
1.0000 [drp] | OPHTHALMIC | Status: DC | PRN
  Administered 2015-03-05: 1 [drp] via OPHTHALMIC

## 2015-03-04 MED ORDER — PHENYLEPHRINE HCL 10 % OP SOLN
1.0000 [drp] | OPHTHALMIC | Status: AC
Start: 2015-03-04 — End: 2015-03-05

## 2015-03-04 MED ORDER — MOXIFLOXACIN HCL 0.5 % OP SOLN
1.0000 [drp] | OPHTHALMIC | Status: AC
Start: 2015-03-04 — End: 2015-03-04
  Administered 2015-03-05: 1 [drp] via OPHTHALMIC

## 2015-03-04 MED ORDER — CYCLOPENTOLATE HCL 2 % OP SOLN
1.0000 [drp] | OPHTHALMIC | Status: AC
Start: 2015-03-04 — End: 2015-03-05

## 2015-03-05 ENCOUNTER — Encounter: Payer: Self-pay | Admitting: *Deleted

## 2015-03-05 ENCOUNTER — Ambulatory Visit: Payer: 59 | Admitting: Anesthesiology

## 2015-03-05 ENCOUNTER — Encounter
Admission: RE | Disposition: A | Discharge status: Home / Self Care | Payer: Self-pay | Source: Ambulatory Visit | Attending: Ophthalmology

## 2015-03-05 ENCOUNTER — Ambulatory Visit
Admission: RE | Admit: 2015-03-05 | Discharge: 2015-03-05 | Disposition: A | Discharge status: Home / Self Care | Payer: 59 | Source: Ambulatory Visit | Attending: Ophthalmology | Admitting: Ophthalmology

## 2015-03-05 DIAGNOSIS — H8109 Meniere's disease, unspecified ear: Secondary | ICD-10-CM | POA: Insufficient documentation

## 2015-03-05 DIAGNOSIS — J449 Chronic obstructive pulmonary disease, unspecified: Secondary | ICD-10-CM | POA: Insufficient documentation

## 2015-03-05 DIAGNOSIS — I739 Peripheral vascular disease, unspecified: Secondary | ICD-10-CM | POA: Insufficient documentation

## 2015-03-05 DIAGNOSIS — E785 Hyperlipidemia, unspecified: Secondary | ICD-10-CM | POA: Insufficient documentation

## 2015-03-05 DIAGNOSIS — N189 Chronic kidney disease, unspecified: Secondary | ICD-10-CM | POA: Insufficient documentation

## 2015-03-05 DIAGNOSIS — H2511 Age-related nuclear cataract, right eye: Secondary | ICD-10-CM | POA: Diagnosis not present

## 2015-03-05 DIAGNOSIS — M5090 Cervical disc disorder, unspecified, unspecified cervical region: Secondary | ICD-10-CM | POA: Insufficient documentation

## 2015-03-05 DIAGNOSIS — I129 Hypertensive chronic kidney disease with stage 1 through stage 4 chronic kidney disease, or unspecified chronic kidney disease: Secondary | ICD-10-CM | POA: Insufficient documentation

## 2015-03-05 DIAGNOSIS — I252 Old myocardial infarction: Secondary | ICD-10-CM | POA: Insufficient documentation

## 2015-03-05 DIAGNOSIS — F17210 Nicotine dependence, cigarettes, uncomplicated: Secondary | ICD-10-CM | POA: Insufficient documentation

## 2015-03-05 HISTORY — DX: Chronic obstructive pulmonary disease, unspecified: J44.9

## 2015-03-05 HISTORY — DX: Chronic kidney disease, unspecified: N18.9

## 2015-03-05 HISTORY — DX: Unspecified hearing loss, unspecified ear: H91.90

## 2015-03-05 HISTORY — PX: CATARACT EXTRACTION W/PHACO: SHX586

## 2015-03-05 HISTORY — DX: Meniere's disease, unspecified ear: H81.09

## 2015-03-05 LAB — POTASSIUM: POTASSIUM, SERUM: 3.4

## 2015-03-05 SURGERY — PHACOEMULSIFICATION, CATARACT, WITH IOL INSERTION
Anesthesia: Monitor Anesthesia Care | Laterality: Right | Wound class: Clean

## 2015-03-05 MED ORDER — PHENYLEPHRINE HCL 10 % OP SOLN
OPHTHALMIC | Status: AC
Start: 2015-03-05 — End: 2015-03-05
  Filled 2015-03-05: qty 5

## 2015-03-05 MED ORDER — MIDAZOLAM HCL 2 MG/2ML IJ SOLN
INTRAMUSCULAR | Status: DC | PRN
  Administered 2015-03-05: 1 mg via INTRAVENOUS

## 2015-03-05 MED ORDER — BUPIVACAINE HCL (PF) 0.75 % IJ SOLN
INTRAMUSCULAR | Status: AC
  Filled 2015-03-05: qty 10

## 2015-03-05 MED ORDER — LIDOCAINE HCL (PF) 4 % IJ SOLN
INTRAMUSCULAR | Status: AC
  Filled 2015-03-05: qty 5

## 2015-03-05 MED ORDER — EPINEPHRINE HCL 1 MG/ML IJ SOLN
INTRAMUSCULAR | Status: AC
  Filled 2015-03-05: qty 1

## 2015-03-05 MED ORDER — CYCLOPENTOLATE HCL 2 % OP SOLN
1.0000 [drp] | OPHTHALMIC | Status: AC
  Administered 2015-03-05 (×4): 1 [drp] via OPHTHALMIC

## 2015-03-05 MED ORDER — TRYPAN BLUE 0.06 % OP SOLN
OPHTHALMIC | Status: AC
  Filled 2015-03-05: qty 0.5

## 2015-03-05 MED ORDER — NA HYALUR & NA CHOND-NA HYALUR 0.55-0.5 ML IO KIT
PACK | INTRAOCULAR | Status: AC
  Filled 2015-03-05: qty 1.05

## 2015-03-05 MED ORDER — LACTATED RINGERS IV SOLN
INTRAVENOUS | Status: DC | PRN
  Administered 2015-03-05: 09:00:00 via INTRAVENOUS

## 2015-03-05 MED ORDER — TRYPAN BLUE 0.06 % OP SOLN
OPHTHALMIC | Status: DC | PRN
  Administered 2015-03-05: 0.5 mL via INTRAOCULAR

## 2015-03-05 MED ORDER — SODIUM CHLORIDE 0.9 % IV SOLN
INTRAVENOUS | Status: DC
  Administered 2015-03-05: 08:00:00 via INTRAVENOUS

## 2015-03-05 MED ORDER — TETRACAINE HCL 0.5 % OP SOLN
OPHTHALMIC | Status: AC
Start: 2015-03-05 — End: 2015-03-05
  Administered 2015-03-05: 1 [drp] via OPHTHALMIC
  Filled 2015-03-05: qty 2

## 2015-03-05 MED ORDER — LIDOCAINE HCL (PF) 1 % IJ SOLN
INTRAOCULAR | Status: DC | PRN
  Administered 2015-03-05: 4 mL via OPHTHALMIC

## 2015-03-05 MED ORDER — MOXIFLOXACIN HCL 0.5 % OP SOLN
OPHTHALMIC | Status: DC | PRN
  Administered 2015-03-05: 1 [drp] via OPHTHALMIC

## 2015-03-05 MED ORDER — FENTANYL CITRATE (PF) 100 MCG/2ML IJ SOLN
INTRAMUSCULAR | Status: DC | PRN
  Administered 2015-03-05: 50 ug via INTRAVENOUS

## 2015-03-05 MED ORDER — CEFUROXIME OPHTHALMIC INJECTION 1 MG/0.1 ML
INJECTION | OPHTHALMIC | Status: AC
  Filled 2015-03-05: qty 0.1

## 2015-03-05 MED ORDER — PROPOFOL 10 MG/ML IV BOLUS
INTRAVENOUS | Status: DC | PRN
  Administered 2015-03-05: 40 mg via INTRAVENOUS

## 2015-03-05 MED ORDER — MOXIFLOXACIN HCL 0.5 % OP SOLN
1.0000 [drp] | OPHTHALMIC | Status: DC | PRN
  Administered 2015-03-05 (×2): 1 [drp] via OPHTHALMIC

## 2015-03-05 MED ORDER — BUPIVACAINE HCL (PF) 0.75 % IJ SOLN
INTRAMUSCULAR | Status: DC | PRN
  Administered 2015-03-05: 8.5 mL via OPHTHALMIC

## 2015-03-05 MED ORDER — MOXIFLOXACIN HCL 0.5 % OP SOLN
OPHTHALMIC | Status: AC
Start: 2015-03-05 — End: 2015-03-05
  Administered 2015-03-05: 1 [drp] via OPHTHALMIC
  Filled 2015-03-05: qty 3

## 2015-03-05 MED ORDER — HYALURONIDASE HUMAN 150 UNIT/ML IJ SOLN
INTRAMUSCULAR | Status: AC
  Filled 2015-03-05: qty 1

## 2015-03-05 MED ORDER — NA CHONDROIT SULF-NA HYALURON 40-30 MG/ML IO SOLN
INTRAOCULAR | Status: DC | PRN
  Administered 2015-03-05: 0.5 mL via INTRAOCULAR

## 2015-03-05 MED ORDER — PHENYLEPHRINE HCL 10 % OP SOLN
1.0000 [drp] | OPHTHALMIC | Status: AC
  Administered 2015-03-05 (×4): 1 [drp] via OPHTHALMIC

## 2015-03-05 MED ORDER — CYCLOPENTOLATE HCL 2 % OP SOLN
OPHTHALMIC | Status: AC
  Filled 2015-03-05: qty 2

## 2015-03-05 MED ORDER — CEFUROXIME OPHTHALMIC INJECTION 1 MG/0.1 ML
INJECTION | OPHTHALMIC | Status: DC | PRN
  Administered 2015-03-05: 0.1 mL via INTRACAMERAL

## 2015-03-05 SURGICAL SUPPLY — 27 items
ACTIVE FMS ×2 IMPLANT
BANDAGE EYE OVAL (MISCELLANEOUS) ×2 IMPLANT
CUP MEDICINE 2OZ PLAST GRAD ST (MISCELLANEOUS) ×2 IMPLANT
GLOVE BIO SURGEON STRL SZ7 (GLOVE) ×2 IMPLANT
GLOVE SURG LX 6.5 MICRO (GLOVE) ×3
GLOVE SURG LX STRL 6.5 MICRO (GLOVE) ×3 IMPLANT
GOWN STRL REUS W/ TWL LRG LVL3 (GOWN DISPOSABLE) ×2 IMPLANT
GOWN STRL REUS W/TWL LRG LVL3 (GOWN DISPOSABLE) ×2
KIT SLEEVE INFUSION .9 MICRO (MISCELLANEOUS) ×2 IMPLANT
LENS IOL ACRSF IQ PC 23.5 (Intraocular Lens) ×1 IMPLANT
LENS IOL ACRYSOF IQ POST 23.5 (Intraocular Lens) ×2 IMPLANT
NEEDLE FILTER BLUNT 18X 1/2SAF (NEEDLE) ×1
NEEDLE FILTER BLUNT 18X1 1/2 (NEEDLE) ×1 IMPLANT
PACK CATARACT (MISCELLANEOUS) ×2 IMPLANT
PACK CATARACT BRASINGTON LX (MISCELLANEOUS) ×2 IMPLANT
PACK EYE AFTER SURG (MISCELLANEOUS) ×2 IMPLANT
PHACO TIP KELMAN 45DEG (TIP) ×4 IMPLANT
SOL BSS BAG (MISCELLANEOUS) ×2
SOL PREP PVP 2OZ (MISCELLANEOUS) ×2
SOLUTION BSS BAG (MISCELLANEOUS) ×1 IMPLANT
SOLUTION PREP PVP 2OZ (MISCELLANEOUS) ×1 IMPLANT
SUT ETHILON 10 0 CS140 6 (SUTURE) ×2 IMPLANT
SYR 3ML LL SCALE MARK (SYRINGE) ×4 IMPLANT
SYR 5ML LL (SYRINGE) ×2 IMPLANT
SYR TB 1ML 27GX1/2 LL (SYRINGE) ×2 IMPLANT
WATER STERILE IRR 1000ML POUR (IV SOLUTION) ×2 IMPLANT
WIPE NON LINTING 3.25X3.25 (MISCELLANEOUS) ×2 IMPLANT

## 2015-03-05 NOTE — H&P (Signed)
The history and physical was faxed to the hospital. The history and physical was reviewed by me and no changes have occurred.   

## 2015-03-05 NOTE — Anesthesia Postprocedure Evaluation (Signed)
  Anesthesia Post-op Note  Patient: Patricia Fields  Procedure(s) Performed: Procedure(s) with comments: CATARACT EXTRACTION PHACO AND INTRAOCULAR LENS PLACEMENT (IOC) (Right) - US: 00:52.3 AP%: 15.2 CDE: 7.96  Anesthesia type:MAC  Patient location: PACU  Post pain: Pain level controlled  Post assessment: Post-op Vital signs reviewed, Patient's Cardiovascular Status Stable, Respiratory Function Stable, Patent Airway and No signs of Nausea or vomiting  Post vital signs: Reviewed and stable  Last Vitals:  Filed Vitals:   03/05/15 0951  BP: 183/91  Pulse: 68  Temp: 36.6 C  Resp: 18    Level of consciousness: awake, alert  and patient cooperative  Complications: No apparent anesthesia complications

## 2015-03-05 NOTE — Transfer of Care (Signed)
Immediate Anesthesia Transfer of Care Note  Patient: Karyl KinnierDiane R Najjar  Procedure(s) Performed: Procedure(s) with comments: CATARACT EXTRACTION PHACO AND INTRAOCULAR LENS PLACEMENT (IOC) (Right) - US: 00:52.3 AP%: 15.2 CDE: 7.96  Patient Location: PACU  Anesthesia Type:MAC  Level of Consciousness: awake, alert  and oriented  Airway & Oxygen Therapy: Patient Spontanous Breathing  Post-op Assessment: Report given to RN  Post vital signs: stable  Last Vitals:  Filed Vitals:   03/05/15 0949  BP: 183/91  Pulse: 67  Temp: 36.4 C  Resp: 20    Complications: No apparent anesthesia complications

## 2015-03-05 NOTE — Op Note (Signed)
  03/05/2015  PRE-OP DIAGNOSIS: Cataract (ICD-10 H25.11) Nuclear sclerotic catarct, RIGHT EYE  Post operative diagnosis: Cataract (ICD-10 H25.11) Nuclear sclerotic cataract, RIGHT EYE  Procedure: Phacoemulsification with introcular lens [id number])   SURGEON: Surgeon(s) and Role:    * Lia HoppingNisha Brandin Stetzer, MD - Primary  ANESTHESIA: Choice   ESTIMATED BLOOD LOSS: MINIMAL  COMPLICATIONS: None  OPERATIVE DESCRIPTION:   Therapeutic options were discussed with the patient preoperatively, including a discussion of risks and benefits of surgery.  Informed consent was obtained. A dilated fundus exam was performed within 6 months.   The patient was premedicated and brought to the operating room and placed on the operating table in the supine position.  A retrobulbar block consisting of xylocaine, hyaluronidase, and sensorcaine was administered. Topical tetracaine was instilled.  After adequate anesthesia, the patient was prepped and draped in the usual fashion.  A wire lid speculum was inserted and the microscope was positioned.  A sideport was used to create a paracentesis site and vision blue was inserted through the paracentesis to aid in visualization of the anterior capsule. Then, a mixture of preservative-free lidocaine, BSS, and epinephrine was was instilled into the anterior chamber to wash out the vision blue, followed by viscoelastic.  A clear corneal incision was created using a keratome blade.  Capsulorrhexis was then performed.  In situ phacoemulsification was performed.  Cortical material was removed with the irrigation-aspiration unit.  Viscoelastic was instilled to open the capsular bag.  A posterior chamber intraocular lens, model 23.5 diopters, was inserted and positioned.  Irrigation-aspiration was used to remove all viscoelastic. Intracameral cefuroxime was injected into the eye. One 10-0 nylon suture was used to re-approximate the wound. Wounds were checked for leakage and confirmed  to be secure.  Lid speculum was removed and a patch and shield was placed over the eye.  Patient was returned to the recovery room in stable condition. IMPLANTS:   Implant Name Type Inv. Item Serial No. Manufacturer Lot No. LRB No. Used  IMPLANT LENS - [id number] Intraocular Lens IMPLANT LENS [id number] ALCON   Right 1     Postoperative care and discharge medication counseling was discussed with the patient or the parents prior to discharge

## 2015-03-05 NOTE — Discharge Instructions (Signed)
Cataract Surgery °Care After °Refer to this sheet in the next few weeks. These instructions provide you with information on caring for yourself after your procedure. Your caregiver may also give you more specific instructions. Your treatment has been planned according to current medical practices, but problems sometimes occur. Call your caregiver if you have any problems or questions after your procedure.  °HOME CARE INSTRUCTIONS  °· Avoid strenuous activities as directed by your caregiver. °· Ask your caregiver when you can resume driving. °· Use eyedrops or other medicines to help healing and control pressure inside your eye as directed by your caregiver. °· Only take over-the-counter or prescription medicines for pain, discomfort, or fever as directed by your caregiver. °· Do not to touch or rub your eyes. °· You may be instructed to use a protective shield during the first few days and nights after surgery. If not, wear sunglasses to protect your eyes. This is to protect the eye from pressure or from being accidentally bumped. °· Keep the area around your eye clean and dry. Avoid swimming or allowing water to hit you directly in the face while showering. Keep soap and shampoo out of your eyes. °· Do not bend or lift heavy objects. Bending increases pressure in the eye. You can walk, climb stairs, and do light household chores. °· Do not put a contact lens into the eye that had surgery until your caregiver says it is okay to do so. °· Ask your doctor when you can return to work. This will depend on the kind of work that you do. If you work in a dusty environment, you may be advised to wear protective eyewear for a period of time. °· Ask your caregiver when it will be safe to engage in sexual activity. °· Continue with your regular eye exams as directed by your caregiver. °What to expect: °· It is normal to feel itching and mild discomfort for a few days after cataract surgery. Some fluid discharge is also common,  and your eye may be sensitive to light and touch. °· After 1 to 2 days, even moderate discomfort should disappear. In most cases, healing will take about 6 weeks. °· If you received an intraocular lens (IOL), you may notice that colors are very bright or have a blue tinge. Also, if you have been in bright sunlight, everything may appear reddish for a few hours. If you see these color tinges, it is because your lens is clear and no longer cloudy. Within a few months after receiving an IOL, these extra colors should go away. When you have healed, you will probably need new glasses. °SEEK MEDICAL CARE IF:  °· You have increased bruising around your eye. °· You have discomfort not helped by medicine. °SEEK IMMEDIATE MEDICAL CARE IF:  °· You have a  fever. °· You have a worsening or sudden vision loss. °· You have redness, swelling, or increasing pain in the eye. °· You have a thick discharge from the eye that had surgery. °MAKE SURE YOU: °· Understand these instructions. °· Will watch your condition. °· Will get help right away if you are not doing well or get worse. °Document Released: 02/25/2005 Document Revised: 10/31/2011 Document Reviewed: 04/01/2011 °ExitCare® Patient Information ©2015 [employer]. This information is not intended to replace advice given to you by your health care provider. Make sure you discuss any questions you have with your health care provider. ° °

## 2015-03-05 NOTE — Anesthesia Preprocedure Evaluation (Signed)
Anesthesia Evaluation  Patient identified by MRN, date of birth, ID band Patient awake    Reviewed: Allergy & Precautions, H&P , NPO status , Patient's Chart, lab work & pertinent test results, reviewed documented beta blocker date and time   Airway Mallampati: II  TM Distance: >3 FB Neck ROM: full    Dental  (+) Missing, Chipped, Poor Dentition   Pulmonary shortness of breath, COPDCurrent Smoker,  breath sounds clear to auscultation  Pulmonary exam normal       Cardiovascular Exercise Tolerance: Poor hypertension, + Peripheral Vascular Disease - Past MI Normal cardiovascular examRhythm:regular Rate:Normal     Neuro/Psych PSYCHIATRIC DISORDERS negative neurological ROS     GI/Hepatic negative GI ROS, Neg liver ROS,   Endo/Other  negative endocrine ROS  Renal/GU Renal disease  negative genitourinary   Musculoskeletal   Abdominal   Peds  Hematology negative hematology ROS (+)   Anesthesia Other Findings Past Medical History:   Peripheral arterial disease                     Feb 2011       Comment:by renal artery duplex   Hypertension                                                 Hyperlipidemia                                               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*              History of nephrolithiasis                                   Interstitial cystitis                                        Tobacco abuse                                                  Comment:one pack daily   COPD (chronic obstructive pulmonary disease)                 Chronic kidney disease                                         Comment:renal stent   Meniere disease                                              HOH (hard of hearing)  Reproductive/Obstetrics negative OB ROS                             Anesthesia Physical Anesthesia Plan  ASA:  III  Anesthesia Plan: MAC   Post-op Pain Management:    Induction:   Airway Management Planned:   Additional Equipment:   Intra-op Plan:   Post-operative Plan:   Informed Consent: I have reviewed the patients History and Physical, chart, labs and discussed the procedure including the risks, benefits and alternatives for the proposed anesthesia with the patient or authorized representative who has indicated his/her understanding and acceptance.   Dental Advisory Given  Plan Discussed with: Anesthesiologist, CRNA and Surgeon  Anesthesia Plan Comments:         Anesthesia Quick Evaluation

## 2015-03-05 NOTE — OR Nursing (Signed)
CDE not correct, had to restart centurion

## 2015-03-16 ENCOUNTER — Ambulatory Visit (INDEPENDENT_AMBULATORY_CARE_PROVIDER_SITE_OTHER): Payer: 59 | Admitting: Internal Medicine

## 2015-03-16 DIAGNOSIS — F411 Generalized anxiety disorder: Secondary | ICD-10-CM

## 2015-03-17 NOTE — Assessment & Plan Note (Signed)
Patient failed to keep a scheduled appointment and will be charged a no show fee,

## 2015-03-17 NOTE — Progress Notes (Signed)
Patient failed to keep s a scheduled 30 minute appointment

## 2015-03-25 NOTE — Addendum Note (Signed)
Addendum  created 03/25/15 0454 by Eduardo Osier, CRNA   Modules edited: Anesthesia Responsible Staff

## 2015-04-22 ENCOUNTER — Other Ambulatory Visit: Payer: Self-pay | Admitting: Internal Medicine

## 2015-04-22 NOTE — Telephone Encounter (Signed)
Last OV 6.22.16.  Please advise refills

## 2015-04-23 NOTE — Telephone Encounter (Signed)
rx faxed

## 2015-04-23 NOTE — Telephone Encounter (Signed)
Ok to refill,  Authorized in epic and printed  

## 2015-05-15 ENCOUNTER — Other Ambulatory Visit: Payer: Self-pay | Admitting: Internal Medicine

## 2015-11-06 ENCOUNTER — Encounter: Payer: Self-pay | Admitting: Internal Medicine

## 2015-11-06 ENCOUNTER — Ambulatory Visit (INDEPENDENT_AMBULATORY_CARE_PROVIDER_SITE_OTHER): Payer: BLUE CROSS/BLUE SHIELD | Admitting: Internal Medicine

## 2015-11-06 ENCOUNTER — Other Ambulatory Visit (INDEPENDENT_AMBULATORY_CARE_PROVIDER_SITE_OTHER): Payer: BLUE CROSS/BLUE SHIELD

## 2015-11-06 VITALS — BP 126/68 | HR 88 | Temp 97.6°F | Resp 12 | Ht 58.5 in | Wt 165.0 lb

## 2015-11-06 DIAGNOSIS — I10 Essential (primary) hypertension: Secondary | ICD-10-CM

## 2015-11-06 DIAGNOSIS — Z1239 Encounter for other screening for malignant neoplasm of breast: Secondary | ICD-10-CM

## 2015-11-06 DIAGNOSIS — E785 Hyperlipidemia, unspecified: Secondary | ICD-10-CM | POA: Diagnosis not present

## 2015-11-06 DIAGNOSIS — R300 Dysuria: Secondary | ICD-10-CM

## 2015-11-06 DIAGNOSIS — N3010 Interstitial cystitis (chronic) without hematuria: Secondary | ICD-10-CM

## 2015-11-06 DIAGNOSIS — Z720 Tobacco use: Secondary | ICD-10-CM

## 2015-11-06 DIAGNOSIS — R7301 Impaired fasting glucose: Secondary | ICD-10-CM

## 2015-11-06 DIAGNOSIS — E559 Vitamin D deficiency, unspecified: Secondary | ICD-10-CM | POA: Diagnosis not present

## 2015-11-06 DIAGNOSIS — Z23 Encounter for immunization: Secondary | ICD-10-CM

## 2015-11-06 DIAGNOSIS — Z716 Tobacco abuse counseling: Secondary | ICD-10-CM

## 2015-11-06 LAB — COMPREHENSIVE METABOLIC PANEL
ALBUMIN: 3.9 g/dL (ref 3.5–5.2)
ALT: 21 U/L (ref 0–35)
AST: 15 U/L (ref 0–37)
Alkaline Phosphatase: 128 U/L — ABNORMAL HIGH (ref 39–117)
BUN: 15 mg/dL (ref 6–23)
CHLORIDE: 99 meq/L (ref 96–112)
CO2: 28 mEq/L (ref 19–32)
CREATININE: 1.12 mg/dL (ref 0.40–1.20)
Calcium: 9.7 mg/dL (ref 8.4–10.5)
GFR: 52.64 mL/min — ABNORMAL LOW (ref >60.00)
GLUCOSE: 120 mg/dL — AB (ref 70–99)
Potassium: 3.4 mEq/L — ABNORMAL LOW (ref 3.5–5.1)
SODIUM: 138 meq/L (ref 135–145)
Total Bilirubin: 0.8 mg/dL (ref 0.2–1.2)
Total Protein: 7.4 g/dL (ref 6.0–8.3)

## 2015-11-06 LAB — LIPID PANEL
CHOL/HDL RATIO: 5
Cholesterol: 210 mg/dL — ABNORMAL HIGH (ref 0–200)
HDL: 45.4 mg/dL (ref >39.00)
LDL CALC: 138 mg/dL — AB (ref 0–99)
NonHDL: 164.31
TRIGLYCERIDES: 130 mg/dL (ref 0.0–149.0)
VLDL: 26 mg/dL (ref 0.0–40.0)

## 2015-11-06 MED ORDER — CIPROFLOXACIN HCL 250 MG PO TABS: 250.0000 mg | ORAL_TABLET | Freq: Two times a day (BID) | ORAL | Status: DC

## 2015-11-06 MED ORDER — PENTOSAN POLYSULFATE SODIUM 100 MG PO CAPS: 100.0000 mg | ORAL_CAPSULE | Freq: Three times a day (TID) | ORAL | Status: DC

## 2015-11-06 NOTE — Patient Instructions (Addendum)
I want you to lose 15 lbs over the next 6 months,  To get your BMI < 30   heck your Chantix pills to see if they are scored (can be cut in hlaf if they have a line in them )  I am prescribing you Cipro for 3 days,  If not better,  You can try starting the Elmiron   Interstitial Cystitis Interstitial cystitis is a condition that causes inflammation of the bladder. The bladder is a hollow organ in the lower part of your abdomen. It stores urine after the urine is made by your kidneys. With interstitial cystitis, you may have pain in the bladder area. You may also have a frequent and urgent need to urinate. The severity of interstitial cystitis can vary from person to person. You may have flare-ups of the condition, and then it may go away for a while. For many people who have this condition, it becomes a long-term problem. CAUSES The cause of this condition is not known. RISK FACTORS This condition is more likely to develop in women. SYMPTOMS Symptoms of interstitial cystitis vary, and they can change over time. Symptoms may include:  Discomfort or pain in the bladder area. This can range from mild to severe. The pain may change in intensity as the bladder fills with urine or as it empties.  Pelvic pain.  An urgent need to urinate.  Frequent urination.  Pain during sexual intercourse.  Pinpoint bleeding on the bladder wall. For women, the symptoms often get worse during menstruation. DIAGNOSIS This condition is diagnosed by evaluating your symptoms and ruling out other causes. A physical exam will be done. Various tests may be done to rule out other conditions. Common tests include:  Urine tests.  Cystoscopy. In this test, a tool that is like a very thin telescope is used to look into your bladder.  Biopsy. This involves taking a sample of tissue from the bladder wall to be examined under a microscope. TREATMENT There is no cure for interstitial cystitis, but treatment methods are  available to control your symptoms. Work closely with your health care provider to find the treatments that will be most effective for you. Treatment options may include:  Medicines to relieve pain and to help reduce the number of times that you feel the need to urinate.  Bladder training. This involves learning ways to control when you urinate, such as:  Urinating at scheduled times.  Training yourself to delay urination.  Doing exercises (Kegel exercises) to strengthen the muscles that control urine flow.  Lifestyle changes, such as changing your diet or taking steps to control stress.  Use of a device that provides electrical stimulation in order to reduce pain.  A procedure that stretches your bladder by filling it with air or fluid.  Surgery. This is rare. It is only done for extreme cases if other treatments do not help. HOME CARE INSTRUCTIONS  Take medicines only as directed by your health care provider.  Use bladder training techniques as directed.  Keep a bladder diary to find out which foods, liquids, or activities make your symptoms worse.  Use your bladder diary to schedule bathroom trips. If you are away from home, plan to be near a bathroom at each of your scheduled times.  Make sure you urinate just before you leave the house and just before you go to bed.  Do Kegel exercises as directed by your health care provider.  Do not drink alcohol.  Do not use  any tobacco products, including cigarettes, chewing tobacco, or electronic cigarettes. If you need help quitting, ask your health care provider.  Make dietary changes as directed by your health care provider. You may need to avoid spicy foods and foods that contain a high amount of potassium.  Limit your drinking of beverages that stimulate urination. These include soda, coffee, and tea.  Keep all follow-up visits as directed by your health care provider. This is important. SEEK MEDICAL CARE IF:  Your symptoms  do not get better after treatment.  Your pain and discomfort are getting worse.  You have more frequent urges to urinate.  You have a fever. SEEK IMMEDIATE MEDICAL CARE IF:  You are not able to control your bladder at all.   This information is not intended to replace advice given to you by your health care provider. Make sure you discuss any questions you have with your health care provider.   Document Released: 04/08/2004 Document Revised: 08/29/2014 Document Reviewed: 04/15/2014 Else[employer] Interactive Patient Education [employer]2016 [employer]

## 2015-11-06 NOTE — Progress Notes (Signed)
Subjective:  Patient ID: Patricia Fields, female    DOB: 03/10/1956  Age: 60 y.o. MRN: [id number]  CC: The primary encounter diagnosis was Dysuria. Diagnoses of Breast cancer screening, Essential hypertension, Hyperlipidemia, Vitamin D deficiency, Encounter for immunization, Tobacco abuse, Tobacco abuse counseling, and Interstitial cystitis were also pertinent to this visit.  HPI Patricia Fields presents for evaluation of bladder pain and dysuria that has been present for the last 2-3 days .  suprapubic cramping and burning.  Heating pad helped the cramping but not the burning.  She has a history of interstitial cystitis but has not seen Dr Achilles Dunk in over a year and  has never been prescribed Elmiron.  She has controlled IC with diet up to this point.  She has a history of renal artery stenosis with stent placement.      Tobacco abuse,  Down to half pack of cigs.  Son living with her now,  Grandchildren stay with her,  Doesn't smoke in the house,  Not particularly stressed.  Could not remember  To take Chantix twice daily so it didn't work   Spent 3 minutes discussing risk of continued tobacco abuse, including but not limited to CAD, PAD, hypertension, and CA.  He is not interested in pharmacotherapy at this time.  Had been on Obamacare after quitting her job at [employer].  Goes on [place] as of July 1.       Outpatient Prescriptions Prior to Visit  Medication Sig Dispense Refill  . buPROPion (WELLBUTRIN SR) 150 MG 12 hr tablet TAKE ONE TABLET BY MOUTH TWICE DAILY 180 tablet 0  . Cholecalciferol (VITAMIN D3) 1000 UNITS CAPS Take by mouth.      . ergocalciferol (DRISDOL) 50000 UNITS capsule Take 1 capsule (50,000 Units total) by mouth once a week. 12 capsule 0  . losartan-hydrochlorothiazide (HYZAAR) 100-25 MG per tablet Take 1 tablet by mouth daily. 30 tablet 5  . metoprolol succinate (TOPROL-XL) 25 MG 24 hr tablet TAKE ONE TABLET BY MOUTH ONCE DAILY 90 tablet 0  . ondansetron (ZOFRAN) 8 MG tablet  Take 1 tablet (8 mg total) by mouth every 8 (eight) hours as needed for nausea or vomiting. 30 tablet 0  . PARoxetine (PAXIL) 10 MG tablet TAKE ONE TABLET BY MOUTH IN THE MORNING 90 tablet 0  . simvastatin (ZOCOR) 20 MG tablet Take 1 tablet (20 mg total) by mouth at bedtime. 90 tablet 3  . traMADol (ULTRAM) 50 MG tablet TAKE ONE TABLET BY MOUTH EVERY 6 HOURS AS NEEDED 120 tablet 0  . varenicline (CHANTIX STARTING MONTH PAK) 0.5 MG X 11 & 1 MG X 42 tablet Take one 0.5 mg tablet by mouth once daily for 3 days, then increase to one 0.5 mg tablet twice daily for 4 days, then increase to one 1 mg tablet twice daily. 53 tablet 0  . vitamin E 100 UNIT capsule Take 100 Units by mouth daily.     No facility-administered medications prior to visit.    Review of Systems;  Patient denies headache, fevers, malaise, unintentional weight loss, skin rash, eye pain, sinus congestion and sinus pain, sore throat, dysphagia,  hemoptysis , cough, dyspnea, wheezing, chest pain, palpitations, orthopnea, edema, abdominal pain, nausea, melena, diarrhea, constipation, flank pain, dysuria, hematuria, urinary  Frequency, nocturia, numbness, tingling, seizures,  Focal weakness, Loss of consciousness,  Tremor, insomnia, depression, anxiety, and suicidal ideation.      Objective:  BP 126/68 mmHg  Pulse 88  Temp(Src) 97.6 F (36.4  C) (Oral)  Resp 12  Ht 4' 10.5" (1.486 m)  Wt 165 lb (74.844 kg)  BMI 33.89 kg/m2  SpO2 98%  BP Readings from Last 3 Encounters:  11/06/15 126/68  03/05/15 169/86  02/11/15 160/90    Wt Readings from Last 3 Encounters:  11/06/15 165 lb (74.844 kg)  03/05/15 174 lb (78.926 kg)  02/11/15 171 lb 8 oz (77.792 kg)    General appearance: alert, cooperative and appears stated age Ears: normal TM's and external ear canals both ears Throat: lips, mucosa, and tongue normal; teeth and gums normal Neck: no adenopathy, no carotid bruit, supple, symmetrical, trachea midline and thyroid not  enlarged, symmetric, no tenderness/mass/nodules Back: symmetric, no curvature. ROM normal. No CVA tenderness. Lungs: clear to auscultation bilaterally Heart: regular rate and rhythm, S1, S2 normal, no murmur, click, rub or gallop Abdomen: soft, non-tender; bowel sounds normal; no masses,  no organomegaly Pulses: 2+ and symmetric Skin: Skin color, texture, turgor normal. No rashes or lesions Lymph nodes: Cervical, supraclavicular, and axillary nodes normal.  Lab Results  Component Value Date   HGBA1C 6.0 01/25/2012    Lab Results  Component Value Date   CREATININE 1.02 02/11/2015   CREATININE 1.0 07/21/2014   CREATININE 1.1 03/14/2014    Lab Results  Component Value Date   WBC 8.5 08/14/2014   HGB 14.6 08/14/2014   HCT 43.1 08/14/2014   PLT 293.0 08/14/2014   GLUCOSE 116* 02/11/2015   CHOL 277* 02/11/2015   TRIG 230.0* 02/11/2015   HDL 44.20 02/11/2015   LDLDIRECT 163.0 02/11/2015   LDLCALC 201* 08/14/2014   ALT 20 02/11/2015   AST 14 02/11/2015   NA 141 02/11/2015   K 3.1* 02/19/2015   CL 103 02/11/2015   CREATININE 1.02 02/11/2015   BUN 13 02/11/2015   CO2 30 02/11/2015   TSH 1.26 08/14/2014   HGBA1C 6.0 01/25/2012    No results found.  Assessment & Plan:   Problem List Items Addressed This Visit    Tobacco abuse    Advised to resume chantix.       Hypertension    Well controlled on current regimen. Renal function has not been checked since June ,  no changes today.  Lab Results  Component Value Date   CREATININE 1.02 02/11/2015   Lab Results  Component Value Date   NA 141 02/11/2015   K 3.1* 02/19/2015   CL 103 02/11/2015   CO2 30 02/11/2015         Relevant Orders   Comprehensive metabolic panel   Interstitial cystitis    Unclear if current symptoms are due to UTI or IC since she cannot produce a urine specimen.  Will treat empirically with cipro for 3 days, and if symptoms persistent would recommend starting Elmiron.       Tobacco abuse  counseling    Smoking cessation instruction/counseling given:  counseled patient on the dangers of tobacco use, advised patient to stop smoking, and reviewed strategies to maximize success.  She is interested in resuming chanitx      Hyperlipidemia   Relevant Orders   Lipid panel   Breast cancer screening   Relevant Orders   MM DIGITAL SCREENING BILATERAL    Other Visit Diagnoses    Dysuria    -  Primary    Relevant Orders    POCT Urinalysis Dipstick    Urine culture (Completed)    Vitamin D deficiency        Relevant Orders  VITAMIN D 25 Hydroxy (Vit-D Deficiency, Fractures)    Encounter for immunization        Relevant Orders    Flu Vaccine QUAD 36+ mos IM      A total of 25 minutes of face to face time was spent with patient more than half of which was spent in counselling and coordination of care   I am having Ms. Marquess start on ciprofloxacin and pentosan polysulfate. I am also having her maintain her Vitamin D3, vitamin E, ondansetron, ergocalciferol, varenicline, losartan-hydrochlorothiazide, simvastatin, buPROPion, traMADol, PARoxetine, and metoprolol succinate.  Meds ordered this encounter  Medications  . ciprofloxacin (CIPRO) 250 MG tablet    Sig: Take 1 tablet (250 mg total) by mouth 2 (two) times daily.    Dispense:  6 tablet    Refill:  0  . pentosan polysulfate (ELMIRON) 100 MG capsule    Sig: Take 1 capsule (100 mg total) by mouth 3 (three) times daily.    Dispense:  90 capsule    Refill:  1    There are no discontinued medications.  Follow-up: No Follow-up on file.   Sherlene ShamsULLO, Patricia Kitamura L, MD

## 2015-11-06 NOTE — Progress Notes (Signed)
Pre-visit discussion using our clinic review tool. No additional management support is needed unless otherwise documented below in the visit note.  

## 2015-11-07 LAB — URINE CULTURE
Colony Count: NO GROWTH
Organism ID, Bacteria: NO GROWTH

## 2015-11-08 NOTE — Assessment & Plan Note (Signed)
Unclear if current symptoms are due to UTI or IC since she cannot produce a urine specimen.  Will treat empirically with cipro for 3 days, and if symptoms persistent would recommend starting Elmiron.

## 2015-11-08 NOTE — Assessment & Plan Note (Addendum)
Well controlled on current regimen. Renal function has not been checked since June ,  no changes today.  Lab Results  Component Value Date   CREATININE 1.02 02/11/2015   Lab Results  Component Value Date   NA 141 02/11/2015   K 3.1* 02/19/2015   CL 103 02/11/2015   CO2 30 02/11/2015

## 2015-11-08 NOTE — Assessment & Plan Note (Signed)
Smoking cessation instruction/counseling given:  counseled patient on the dangers of tobacco use, advised patient to stop smoking, and reviewed strategies to maximize success.  She is interested in resuming chanitx

## 2015-11-08 NOTE — Assessment & Plan Note (Signed)
Advised to resume chantix.

## 2015-11-09 LAB — VITAMIN D 25 HYDROXY (VIT D DEFICIENCY, FRACTURES): VITD: 27.47 ng/mL — ABNORMAL LOW (ref 30.00–100.00)

## 2015-11-09 NOTE — Addendum Note (Signed)
Addended by: Sherlene ShamsULLO, Mancil Pfenning L on: 11/09/2015 08:51 AM   Modules accepted: Orders, SmartSet

## 2015-11-10 ENCOUNTER — Encounter: Payer: Self-pay | Admitting: *Deleted

## 2015-11-11 ENCOUNTER — Telehealth: Payer: Self-pay

## 2015-11-11 NOTE — Telephone Encounter (Signed)
PA started on cover my meds for Elmiron.  Awaiting response.

## 2015-11-13 MED ORDER — AMITRIPTYLINE HCL 25 MG PO TABS: 25.0000 mg | ORAL_TABLET | Freq: Every day | ORAL | Status: DC

## 2015-11-13 NOTE — Telephone Encounter (Signed)
PA was denied, patient needs to have tried two of the three listed drugs first: hydroxyzine, cimetidine, and or amitriptyline.  She has tried hydroxyzine.  Please advise.

## 2015-11-13 NOTE — Addendum Note (Signed)
Addended by: Sherlene ShamsULLO, Bennet Kujawa L on: 11/13/2015 01:16 PM   Modules accepted: Orders

## 2015-11-13 NOTE — Telephone Encounter (Signed)
30 day trial of Elavil instead sent to [facility]

## 2015-11-18 ENCOUNTER — Telehealth: Payer: Self-pay | Admitting: Internal Medicine

## 2015-11-18 NOTE — Telephone Encounter (Signed)
Pt was calling about her Vitamin D. She wants to speak to [place]. Please call home number.

## 2015-11-18 NOTE — Telephone Encounter (Addendum)
Pharmacy requiring PA for Elmiron, price is $800.00, anything less available.

## 2015-11-18 NOTE — Telephone Encounter (Signed)
There is nothing else that I know of that works for interstitial cystitis.  She may need to see Dr Cope/Stoioff again  For alternatives if symptoms persist

## 2015-11-20 NOTE — Telephone Encounter (Signed)
PA started on cover my meds for Elmiron for cystitis.

## 2015-12-09 ENCOUNTER — Other Ambulatory Visit: Payer: Self-pay | Admitting: Internal Medicine

## 2015-12-10 ENCOUNTER — Telehealth: Payer: Self-pay | Admitting: *Deleted

## 2015-12-10 NOTE — Telephone Encounter (Signed)
Patient requested a call in return in regards to the medication amitriptyline, she stated that she was not sure what the medication was for? She stated the medication is for depression and she's not depressed.  Pt Contact [phone]

## 2015-12-10 NOTE — Telephone Encounter (Signed)
Spoke with the patient.  She will take the Elmiron, she was only given a 30 day supply and it was $80 out of pocket.  What would you like her to do after this months is gone?

## 2015-12-10 NOTE — Telephone Encounter (Signed)
Then she will take the amitripytline.  If it doesn't work as well,  We can justify a prior auth appeal for elmiron

## 2015-12-10 NOTE — Telephone Encounter (Signed)
Spoke with the patient, she went the pharmacy and didn't realize we had called in a different prescription for the Elmiron for her to try and she filled both the Elmiron and the Amitriptyline.  I explained why we sent in the amitriptyline.  She had to pay out of pocket for the elmiron as the insurance didn't cover it.  She wants to know which med you want her to take now that she has both of them.  thanks

## 2015-12-10 NOTE — Telephone Encounter (Signed)
Elmiron

## 2015-12-11 NOTE — Telephone Encounter (Signed)
Attempted to call patient back and her phone number is not working, a message states that the number has been disconnected or changed.

## 2016-03-09 ENCOUNTER — Other Ambulatory Visit: Payer: Self-pay | Admitting: Internal Medicine

## 2016-03-11 ENCOUNTER — Other Ambulatory Visit: Payer: Self-pay | Admitting: *Deleted

## 2016-03-11 MED ORDER — METOPROLOL SUCCINATE ER 25 MG PO TB24: 25.0000 mg | ORAL_TABLET | Freq: Every day | ORAL | Status: DC

## 2016-03-11 MED ORDER — LOSARTAN POTASSIUM-HCTZ 100-25 MG PO TABS: 1.0000 | ORAL_TABLET | Freq: Every day | ORAL | Status: DC

## 2016-07-19 ENCOUNTER — Ambulatory Visit (INDEPENDENT_AMBULATORY_CARE_PROVIDER_SITE_OTHER): Payer: Commercial Managed Care - HMO | Admitting: Family

## 2016-07-19 ENCOUNTER — Encounter: Payer: Self-pay | Admitting: Family

## 2016-07-19 VITALS — BP 162/82 | HR 90 | Temp 98.1°F | Ht 58.5 in | Wt 154.4 lb

## 2016-07-19 DIAGNOSIS — I10 Essential (primary) hypertension: Secondary | ICD-10-CM

## 2016-07-19 DIAGNOSIS — Z1231 Encounter for screening mammogram for malignant neoplasm of breast: Secondary | ICD-10-CM

## 2016-07-19 DIAGNOSIS — J40 Bronchitis, not specified as acute or chronic: Secondary | ICD-10-CM | POA: Insufficient documentation

## 2016-07-19 DIAGNOSIS — J209 Acute bronchitis, unspecified: Secondary | ICD-10-CM | POA: Diagnosis not present

## 2016-07-19 DIAGNOSIS — Z1239 Encounter for other screening for malignant neoplasm of breast: Secondary | ICD-10-CM

## 2016-07-19 MED ORDER — ALBUTEROL SULFATE HFA 108 (90 BASE) MCG/ACT IN AERS: 2.0000 | INHALATION_SPRAY | Freq: Four times a day (QID) | RESPIRATORY_TRACT | 1 refills | Status: DC | PRN

## 2016-07-19 MED ORDER — AZITHROMYCIN 250 MG PO TABS: ORAL_TABLET | ORAL | 0 refills | Status: DC

## 2016-07-19 MED ORDER — IPRATROPIUM-ALBUTEROL 0.5-2.5 (3) MG/3ML IN SOLN: 3.0000 mL | Freq: Four times a day (QID) | RESPIRATORY_TRACT | Status: DC

## 2016-07-19 NOTE — Assessment & Plan Note (Signed)
Elevated. No signs or symptoms of hypertensive urgency or emergency at this time. Advised patient to stop decongestants which I suspect are elevating blood pressure. Patient will check blood pressure when she's at home, and the value for greater than 140/90, she'll call our office. Follow-up blood pressure in 1 week.

## 2016-07-19 NOTE — Progress Notes (Signed)
Pre visit review using our clinic review tool, if applicable. No additional management support is needed unless otherwise documented below in the visit note. 

## 2016-07-19 NOTE — Assessment & Plan Note (Signed)
Afebrile. Improved after DuoNeb treatment. SaO2 within normal limits. Patient and I jointly agreed to go ahead and start antibiotic especially in the context of her COPD, current smoking status. Return precautions given.

## 2016-07-19 NOTE — Assessment & Plan Note (Signed)
Due Order placed again; information given to patient that she can call and schedule.

## 2016-07-19 NOTE — Progress Notes (Signed)
Subjective:    Patient ID: Patricia Kinnieriane R Coate, female    DOB: 1956/07/09, 60 y.o.   MRN: [id number]  CC: Patricia Fields is a 60 y.o. female who presents today for an acute visit.    HPI: CC; cough, congestion 3 days ago, worsening. Endorses wheezing. No SOB, fever, chills, ear pressure, sinus pressure, sore throat.    Had been taking Sudafed, cough syrup OTC with some relief.   HTN- Taken BP medications today. Denies exertional chest pain or pressure, numbness or tingling radiating to left arm or jaw, palpitations, dizziness, frequent headaches, changes in vision, or shortness of breath.   H/o COPD. Current smoker. Ran out inhalers for COPD.      HISTORY:  Past Medical History:  Diagnosis Date  . 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 Chronic kidney disease    renal stent  . COPD (chronic obstructive pulmonary disease) (HCC)   . History of nephrolithiasis   . HOH (hard of hearing)   . Hyperlipidemia   . Hypertension   . Interstitial cystitis   . Meniere disease   . Peripheral arterial disease [facility](HCC) Feb 2011   by renal artery duplex  . Tobacco abuse    one pack daily   Past Surgical History:  Procedure Laterality Date  . ABDOMINAL HYSTERECTOMY    . arthroscopy     left knee , remote  . BREAST SURGERY     left breast cyst, benign  . CARPAL TUNNEL RELEASE     Dr. Hyacinth MeekerMiller,  right hand   . CATARACT EXTRACTION W/PHACO Right 03/05/2015   Procedure: CATARACT EXTRACTION PHACO AND INTRAOCULAR LENS PLACEMENT (IOC);  Surgeon: Lia HoppingNisha Mukherjee, MD;  Location: [facility] ORS;  Service: Ophthalmology;  Laterality: Right;  US: 00:52.3 AP%: 15.2 CDE: 7.96  . KNEE ARTHROSCOPY Left   . TOTAL ABDOMINAL HYSTERECTOMY W/ BILATERAL SALPINGOOPHORECTOMY  1992   due to uterine tumor, and endometriosis    Family History  Problem Relation Age of Onset  . Coronary artery disease Mother   . COPD Mother   . Heart disease Mother     Allergies: Bee venom and Tomato Current  Outpatient Prescriptions on File Prior to Visit  Medication Sig Dispense Refill  . amitriptyline (ELAVIL) 25 MG tablet Take 1 tablet (25 mg total) by mouth at bedtime. 30 tablet 1  . buPROPion (WELLBUTRIN SR) 150 MG 12 hr tablet TAKE ONE TABLET BY MOUTH TWICE DAILY 180 tablet 0  . Cholecalciferol (VITAMIN D3) 1000 UNITS CAPS Take by mouth.      . ciprofloxacin (CIPRO) 250 MG tablet Take 1 tablet (250 mg total) by mouth 2 (two) times daily. 6 tablet 0  . ergocalciferol (DRISDOL) 50000 UNITS capsule Take 1 capsule (50,000 Units total) by mouth once a week. 12 capsule 0  . losartan-hydrochlorothiazide (HYZAAR) 100-25 MG tablet Take 1 tablet by mouth daily. 90 tablet 1  . metoprolol succinate (TOPROL-XL) 25 MG 24 hr tablet Take 1 tablet (25 mg total) by mouth daily. 90 tablet 1  . ondansetron (ZOFRAN) 8 MG tablet Take 1 tablet (8 mg total) by mouth every 8 (eight) hours as needed for nausea or vomiting. 30 tablet 0  . PARoxetine (PAXIL) 10 MG tablet TAKE ONE TABLET BY MOUTH IN THE MORNING 30 tablet 3  . pentosan polysulfate (ELMIRON) 100 MG capsule Take 1 capsule (100 mg total) by mouth 3 (three) times daily. 90 capsule 1  . simvastatin (ZOCOR) 20 MG tablet Take 1 tablet (  20 mg total) by mouth at bedtime. 90 tablet 3  . traMADol (ULTRAM) 50 MG tablet TAKE ONE TABLET BY MOUTH EVERY 6 HOURS AS NEEDED 120 tablet 0  . varenicline (CHANTIX STARTING MONTH PAK) 0.5 MG X 11 & 1 MG X 42 tablet Take one 0.5 mg tablet by mouth once daily for 3 days, then increase to one 0.5 mg tablet twice daily for 4 days, then increase to one 1 mg tablet twice daily. 53 tablet 0  . vitamin E 100 UNIT capsule Take 100 Units by mouth daily.     No current facility-administered medications on file prior to visit.     Social History  Substance Use Topics  . Smoking status: Current Every Day Smoker    Packs/day: 1.00    Types: Cigarettes  . Smokeless tobacco: Never Used  . Alcohol use No    Review of Systems    Constitutional: Negative for chills and fever.  HENT: Positive for congestion.   Eyes: Negative for visual disturbance.  Respiratory: Positive for cough.   Cardiovascular: Negative for chest pain and palpitations.  Gastrointestinal: Negative for nausea and vomiting.  Neurological: Negative for dizziness.      Objective:    BP (!) 162/82   Pulse 90   Temp 98.1 F (36.7 C) (Oral)   Ht 4' 10.5" (1.486 m)   Wt 154 lb 6.4 oz (70 kg)   SpO2 97%   BMI 31.72 kg/m    Physical Exam  Constitutional: She appears well-developed and well-nourished.  HENT:  Head: Normocephalic and atraumatic.  Right Ear: Hearing, tympanic membrane, external ear and ear canal normal. No drainage, swelling or tenderness. No foreign bodies. Tympanic membrane is not erythematous and not bulging. No middle ear effusion. No decreased hearing is noted.  Left Ear: Hearing, tympanic membrane, external ear and ear canal normal. No drainage, swelling or tenderness. No foreign bodies. Tympanic membrane is not erythematous and not bulging.  No middle ear effusion. No decreased hearing is noted.  Nose: Nose normal. No rhinorrhea. Right sinus exhibits no maxillary sinus tenderness and no frontal sinus tenderness. Left sinus exhibits no maxillary sinus tenderness and no frontal sinus tenderness.  Mouth/Throat: Uvula is midline, oropharynx is clear and moist and mucous membranes are normal. No oropharyngeal exudate, posterior oropharyngeal edema, posterior oropharyngeal erythema or tonsillar abscesses.  Eyes: Conjunctivae are normal.  Cardiovascular: Regular rhythm, normal heart sounds and normal pulses.   Pulmonary/Chest: Effort normal and breath sounds normal. She has no wheezes. She has no rhonchi. She has no rales.  Lymphadenopathy:       Head (right side): No submental, no submandibular, no tonsillar, no preauricular, no posterior auricular and no occipital adenopathy present.       Head (left side): No submental, no  submandibular, no tonsillar, no preauricular, no posterior auricular and no occipital adenopathy present.    She has no cervical adenopathy.  Neurological: She is alert.  Skin: Skin is warm and dry.  Psychiatric: She has a normal mood and affect. Her speech is normal and behavior is normal. Thought content normal.  Vitals reviewed.  Patient felt significantly better after albuterol treatment. Lung sounds clear and increased     Assessment & Plan:   Problem List Items Addressed This Visit      Cardiovascular and Mediastinum   Hypertension    Elevated. No signs or symptoms of hypertensive urgency or emergency at this time. Advised patient to stop decongestants which I suspect are elevating blood pressure.  Patient will check blood pressure when she's at home, and the value for greater than 140/90, she'll call our office. Follow-up blood pressure in 1 week.        Respiratory   Acute bronchitis - Primary    Afebrile. Improved after DuoNeb treatment. SaO2 within normal limits. Patient and I jointly agreed to go ahead and start antibiotic especially in the context of her COPD, current smoking status. Return precautions given.      Relevant Medications   ipratropium-albuterol (DUONEB) 0.5-2.5 (3) MG/3ML nebulizer solution 3 mL   albuterol (PROVENTIL HFA) 108 (90 Base) MCG/ACT inhaler   azithromycin (ZITHROMAX) 250 MG tablet     Other   Screening for breast cancer    Due Order placed again; information given to patient that she can call and schedule.      Relevant Orders   MM DIGITAL SCREENING BILATERAL        I am having Ms. Perot start on albuterol and azithromycin. I am also having her maintain her Vitamin D3, vitamin E, ondansetron, ergocalciferol, varenicline, simvastatin, buPROPion, traMADol, ciprofloxacin, pentosan polysulfate, amitriptyline, PARoxetine, losartan-hydrochlorothiazide, and metoprolol succinate. We will continue to administer ipratropium-albuterol.   Meds  ordered this encounter  Medications  . ipratropium-albuterol (DUONEB) 0.5-2.5 (3) MG/3ML nebulizer solution 3 mL  . albuterol (PROVENTIL HFA) 108 (90 Base) MCG/ACT inhaler    Sig: Inhale 2 puffs into the lungs every 6 (six) hours as needed for wheezing or shortness of breath.    Dispense:  1 Inhaler    Refill:  1    Order Specific Question:   Supervising Provider    Answer:   Darrick Huntsman, TERESA L [[id number]]  . azithromycin (ZITHROMAX) 250 MG tablet    Sig: Tale 500 mg PO on day 1, then 250 mg PO q24h x 4 days.    Dispense:  6 tablet    Refill:  0    Order Specific Question:   Supervising Provider    Answer:   Sherlene Shams [[id number]]    Return precautions given.   Risks, benefits, and alternatives of the medications and treatment plan prescribed today were discussed, and patient expressed understanding.   Education regarding symptom management and diagnosis given to patient on AVS.  Continue to follow with TULLO, Mar Daring, MD for routine health maintenance.   Patricia Kinnier and I agreed with plan.   Rennie Plowman, FNP

## 2016-07-19 NOTE — Patient Instructions (Addendum)
Monitor BP; call our office if values > 140/90.  Stop ALL decongestants.   We placed a referral. Mammogram this year. I asked that you call one the below locations and schedule this when it is convenient for you.   If you have dense breasts, you may ask for 3D mammogram over the traditional 2D mammogram as new evidence suggest 3D is superior. Please note that NOT all insurance companies cover 3D and you may have to pay a higher copay. You may call your insurance company to further clarify your benefits.   Options for Mammogram.    [facility]  [address]  [place], [place]  [phone]  * [facility]   [facility] [address] [place], [place] [phone] * Note if you ask for 3D mammogram at this location, you must request [place], [place] location*   Increase intake of clear fluids. Congestion is best treated by hydration, when mucus is wetter, it is thinner, less sticky, and easier to expel from the body, either through coughing up drainage, or by blowing your nose.   Get plenty of rest.   Use saline nasal drops and blow your nose frequently. Run a humidifier at night and elevate the head of the bed. Vicks Vapor rub will help with congestion and cough. Steam showers and sinus massage for congestion.   Use Acetaminophen or Ibuprofen as needed for fever or pain. Avoid second hand smoke. Even the smallest exposure will worsen symptoms.   Over the counter medications you can try include Delsym for cough, a decongestant for congestion, and Mucinex or Robitussin as an expectorant. Be sure to just get the plain Mucinex or Robitussin that just has one medication (Guaifenesen). We don't recommend the combination products. Note, be sure to drink two glasses of water with each dose of Mucinex as the medication will not work well without adequate hydration.   You can also try a teaspoon of honey to see if this will help  reduce cough. Throat lozenges can sometimes be beneficial as well.    This illness will typically last 7 - 10 days.   Please follow up with our clinic if you develop a fever greater than 101 F, symptoms worsen, or do not resolve in the next week.

## 2016-07-21 ENCOUNTER — Other Ambulatory Visit: Payer: Self-pay

## 2016-07-21 ENCOUNTER — Telehealth: Payer: Self-pay | Admitting: Internal Medicine

## 2016-07-21 DIAGNOSIS — R059 Cough, unspecified: Secondary | ICD-10-CM

## 2016-07-21 DIAGNOSIS — R05 Cough: Secondary | ICD-10-CM

## 2016-07-21 MED ORDER — ALBUTEROL SULFATE 108 (90 BASE) MCG/ACT IN AEPB: 1.0000 | INHALATION_SPRAY | Freq: Three times a day (TID) | RESPIRATORY_TRACT | 0 refills | Status: DC | PRN

## 2016-07-21 MED ORDER — ALBUTEROL SULFATE 108 (90 BASE) MCG/ACT IN AEPB: 1.0000 | INHALATION_SPRAY | Freq: Three times a day (TID) | RESPIRATORY_TRACT | 2 refills | Status: DC | PRN

## 2016-07-21 NOTE — Telephone Encounter (Signed)
Pt called and stated that M. Arnett prescribed an inhaler for her and wants to know if she will call [employer] and call in a different inhaler and will send it to her. The one that was prescribed was to expensive. Please advise, thank you!  Call pt @ [phone]

## 2016-07-21 NOTE — Telephone Encounter (Signed)
Mal AmabileBrock would you Faxing order to [facility]?  Hope this one is cheaper

## 2016-07-21 NOTE — Telephone Encounter (Signed)
Please advise 

## 2016-07-21 NOTE — Telephone Encounter (Signed)
Refill has been placed. Sent via electronically.

## 2016-07-26 ENCOUNTER — Encounter: Payer: Self-pay | Admitting: Family

## 2016-07-26 ENCOUNTER — Ambulatory Visit (INDEPENDENT_AMBULATORY_CARE_PROVIDER_SITE_OTHER): Payer: Commercial Managed Care - HMO | Admitting: Family

## 2016-07-26 VITALS — BP 158/82 | HR 83 | Temp 98.1°F | Ht 59.0 in | Wt 153.6 lb

## 2016-07-26 DIAGNOSIS — I10 Essential (primary) hypertension: Secondary | ICD-10-CM

## 2016-07-26 MED ORDER — METOPROLOL SUCCINATE ER 50 MG PO TB24: 50.0000 mg | ORAL_TABLET | Freq: Every day | ORAL | 1 refills | Status: DC

## 2016-07-26 NOTE — Assessment & Plan Note (Signed)
Continues to be elevated. Reassured the patient has no other symptoms of hypertensive urgency or emergency at this time. Increased from 50 mg to 100 mg daily. Patient will monitor blood pressure at home and will call me if we're not at goal of less than 140/90. Patient verbalized understanding of plan.

## 2016-07-26 NOTE — Progress Notes (Signed)
Pre visit review using our clinic review tool, if applicable. No additional management support is needed unless otherwise documented below in the visit note. 

## 2016-07-26 NOTE — Progress Notes (Signed)
Subjective:    Patient ID: Patricia Fields, female    DOB: Apr 25, 1956, 60 y.o.   MRN: [id number]  CC: Patricia Patricia Fields is a 60 y.o. female who presents today for follow up.   HPI: Patient was seen one week ago for acute bronchitis and at that time blood pressure was elevated. Here today for follow-up on blood pressure. Cough improving.   Compliant with medication. Denies exertional chest pain or pressure, numbness or tingling radiating to left arm or jaw, palpitations, dizziness, frequent headaches, changes in vision, or shortness of breath.   Current smoker. History of renal stent.     HISTORY:  Past Medical History:  Diagnosis Date  . 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 Chronic kidney disease    renal stent  . COPD (chronic obstructive pulmonary disease) (HCC)   . History of nephrolithiasis   . HOH (hard of hearing)   . Hyperlipidemia   . Hypertension   . Interstitial cystitis   . Meniere disease   . Peripheral arterial disease [facility](HCC) Feb 2011   by renal artery duplex  . Tobacco abuse    one pack daily   Past Surgical History:  Procedure Laterality Date  . ABDOMINAL HYSTERECTOMY    . arthroscopy     left knee , remote  . BREAST SURGERY     left breast cyst, benign  . CARPAL TUNNEL RELEASE     Dr. Hyacinth MeekerMiller,  right hand   . CATARACT EXTRACTION W/PHACO Right 03/05/2015   Procedure: CATARACT EXTRACTION PHACO AND INTRAOCULAR LENS PLACEMENT (IOC);  Surgeon: Lia HoppingNisha Mukherjee, MD;  Location: [facility] ORS;  Service: Ophthalmology;  Laterality: Right;  US: 00:52.3 AP%: 15.2 CDE: 7.96  . KNEE ARTHROSCOPY Left   . TOTAL ABDOMINAL HYSTERECTOMY W/ BILATERAL SALPINGOOPHORECTOMY  1992   due to uterine tumor, and endometriosis    Family History  Problem Relation Age of Onset  . Coronary artery disease Mother   . COPD Mother   . Heart disease Mother     Allergies: Bee venom and Tomato Current Outpatient Prescriptions on File Prior to Visit  Medication Sig  Dispense Refill  . Albuterol Sulfate 108 (90 Base) MCG/ACT AEPB Inhale 1 puff into the lungs every 8 (eight) hours as needed. For wheezing 3 each 0  . amitriptyline (ELAVIL) 25 MG tablet Take 1 tablet (25 mg total) by mouth at bedtime. 30 tablet 1  . azithromycin (ZITHROMAX) 250 MG tablet Tale 500 mg PO on day 1, then 250 mg PO q24h x 4 days. 6 tablet 0  . buPROPion (WELLBUTRIN SR) 150 MG 12 hr tablet TAKE ONE TABLET BY MOUTH TWICE DAILY 180 tablet 0  . Cholecalciferol (VITAMIN D3) 1000 UNITS CAPS Take by mouth.      . ciprofloxacin (CIPRO) 250 MG tablet Take 1 tablet (250 mg total) by mouth 2 (two) times daily. 6 tablet 0  . ergocalciferol (DRISDOL) 50000 UNITS capsule Take 1 capsule (50,000 Units total) by mouth once a week. 12 capsule 0  . losartan-hydrochlorothiazide (HYZAAR) 100-25 MG tablet Take 1 tablet by mouth daily. 90 tablet 1  . ondansetron (ZOFRAN) 8 MG tablet Take 1 tablet (8 mg total) by mouth every 8 (eight) hours as needed for nausea or vomiting. 30 tablet 0  . PARoxetine (PAXIL) 10 MG tablet TAKE ONE TABLET BY MOUTH IN THE MORNING 30 tablet 3  . pentosan polysulfate (ELMIRON) 100 MG capsule Take 1 capsule (100 mg total) by mouth 3 (three)  times daily. 90 capsule 1  . simvastatin (ZOCOR) 20 MG tablet Take 1 tablet (20 mg total) by mouth at bedtime. 90 tablet 3  . traMADol (ULTRAM) 50 MG tablet TAKE ONE TABLET BY MOUTH EVERY 6 HOURS AS NEEDED 120 tablet 0  . varenicline (CHANTIX STARTING MONTH PAK) 0.5 MG X 11 & 1 MG X 42 tablet Take one 0.5 mg tablet by mouth once daily for 3 days, then increase to one 0.5 mg tablet twice daily for 4 days, then increase to one 1 mg tablet twice daily. 53 tablet 0  . vitamin E 100 UNIT capsule Take 100 Units by mouth daily.     Current Facility-Administered Medications on File Prior to Visit  Medication Dose Route Frequency Provider Last Rate Last Dose  . ipratropium-albuterol (DUONEB) 0.5-2.5 (3) MG/3ML nebulizer solution 3 mL  3 mL Nebulization  Q6H Allegra Grana, FNP        Social History  Substance Use Topics  . Smoking status: Current Every Day Smoker    Packs/day: 1.00    Types: Cigarettes  . Smokeless tobacco: Never Used  . Alcohol use No    Review of Systems  Constitutional: Negative for chills and fever.  Eyes: Negative for visual disturbance.  Respiratory: Negative for cough.   Cardiovascular: Negative for chest pain and palpitations.  Gastrointestinal: Negative for nausea and vomiting.  Neurological: Negative for headaches.      Objective:    BP (!) 158/82   Pulse 83   Temp 98.1 F (36.7 C) (Oral)   Ht 4\' 11"  (1.499 m)   Wt 153 lb 9.6 oz (69.7 kg)   SpO2 95%   BMI 31.02 kg/m  BP Readings from Last 3 Encounters:  07/26/16 (!) 158/82  07/19/16 (!) 162/82  11/06/15 126/68   Wt Readings from Last 3 Encounters:  07/26/16 153 lb 9.6 oz (69.7 kg)  07/19/16 154 lb 6.4 oz (70 kg)  11/06/15 165 lb (74.8 kg)    Physical Exam  Constitutional: She appears well-developed and well-nourished.  Eyes: Conjunctivae are normal.  Cardiovascular: Normal rate, regular rhythm, normal heart sounds and normal pulses.   Pulmonary/Chest: Effort normal and breath sounds normal. She has no wheezes. She has no rhonchi. She has no rales.  Neurological: She is alert.  Skin: Skin is warm and dry.  Psychiatric: She has a normal mood and affect. Her speech is normal and behavior is normal. Thought content normal.  Vitals reviewed.      Assessment & Plan:   Problem List Items Addressed This Visit      Cardiovascular and Mediastinum   Hypertension - Primary    Continues to be elevated. Reassured the patient has no other symptoms of hypertensive urgency or emergency at this time. Increased from 50 mg to 100 mg daily. Patient will monitor blood pressure at home and will call me if we're not at goal of less than 140/90. Patient verbalized understanding of plan.      Relevant Medications   metoprolol succinate (TOPROL-XL)  50 MG 24 hr tablet       I have changed Ms. Rickman's metoprolol succinate. I am also having her maintain her Vitamin D3, vitamin E, ondansetron, ergocalciferol, varenicline, simvastatin, buPROPion, traMADol, ciprofloxacin, pentosan polysulfate, amitriptyline, PARoxetine, losartan-hydrochlorothiazide, azithromycin, and Albuterol Sulfate. We will continue to administer ipratropium-albuterol.   Meds ordered this encounter  Medications  . metoprolol succinate (TOPROL-XL) 50 MG 24 hr tablet    Sig: Take 1 tablet (50 mg total) by  mouth daily.    Dispense:  90 tablet    Refill:  1    Order Specific Question:   Supervising Provider    Answer:   Sherlene ShamsULLO, TERESA L [[id number]]    Return precautions given.   Risks, benefits, and alternatives of the medications and treatment plan prescribed today were discussed, and patient expressed understanding.   Education regarding symptom management and diagnosis given to patient on AVS.  Continue to follow with TULLO, Mar DaringERESA L, MD for routine health maintenance.   Patricia Kinnieriane R Janek and I agreed with plan.   Rennie PlowmanMargaret Arnett, FNP

## 2016-07-26 NOTE — Patient Instructions (Signed)
Increase metoprolol from 50mg  to 100mg .   Goal < 140/90.  If there is no improvement in your symptoms, or if there is any worsening of symptoms, or if you have any additional concerns, please return for re-evaluation; or, if we are closed, consider going to the Emergency Room for evaluation if symptoms urgent.   Managing Your Hypertension Hypertension is commonly called high blood pressure. Blood pressure is a measurement of how strongly your blood is pressing against the walls of your arteries. Arteries are blood vessels that carry blood from your heart throughout your body. Blood pressure does not stay the same. It rises when you are active, excited, or nervous. It lowers when you are sleeping or relaxed. If the numbers that measure your blood pressure stay above normal most of the time, you are at risk for health problems. Hypertension is a long-term (chronic) condition in which blood pressure is elevated. This condition often has no signs or symptoms. The cause of the condition is usually not known. What are blood pressure readings? A blood pressure reading is recorded as two numbers, such as "120 over 80" (or 120/80). The first ("top") number is called the systolic pressure. It is a measure of the pressure in your arteries as the heart beats. The second ("bottom") number is called the diastolic pressure. It is a measure of the pressure in your arteries as the heart relaxes between beats. What does my blood pressure reading mean? Blood pressure is classified into four stages. Based on your blood pressure reading, your health care provider may use the following stages to determine what type of treatment, if any, is needed. Systolic pressure and diastolic pressure are measured in a unit called mm Hg. Normal  Systolic pressure: below 120.  Diastolic pressure: below 80. Prehypertension  Systolic pressure: 120-139.  Diastolic pressure: 80-89. Hypertension stage 1  Systolic pressure:  140-159.  Diastolic pressure: 90-99. Hypertension stage 2  Systolic pressure: 160 or above.  Diastolic pressure: 100 or above. What health risks are associated with hypertension? Managing your hypertension is an important responsibility. Uncontrolled hypertension can lead to:  A heart attack.  A stroke.  A weakened blood vessel (aneurysm).  Heart failure.  Kidney damage.  Eye damage.  Metabolic syndrome.  Memory and concentration problems. What changes can I make to manage my hypertension? Hypertension can be managed effectively by making lifestyle changes and possibly by taking medicines. Your health care provider will help you come up with a plan to bring your blood pressure within a normal range. Your plan should include the following: Monitoring  Monitor your blood pressure at home as told by your health care provider. Your personal target blood pressure may vary depending on your medical conditions, your age, and other factors.  Have your blood pressure rechecked as told by your health care provider. Lifestyle  Lose weight if necessary.  Get at least 30-45 minutes of aerobic exercise at least 4 times a week.  Do not use any products that contain nicotine or tobacco, such as cigarettes and e-cigarettes. If you need help quitting, ask your health care provider.  Learn ways to reduce stress.  Control any chronic conditions, such as high cholesterol or diabetes. Eating and drinking  Follow the DASH diet. This diet is high in fruits, vegetables, and whole grains. It is low in salt, red meat, and added sugars.  Keep your sodium intake below 2,300 mg per day.  Limit alcoholic beverages. Communication  Review all the medicines you take with  your health care provider because there may be side effects or interactions.  Talk with your health care provider about your diet, exercise habits, and other lifestyle factors that may be contributing to hypertension.  See  your health care provider regularly. Your health care provider can help you create and adjust your plan for managing hypertension. Will I need medicine to control my blood pressure? Your health care provider may prescribe medicine if lifestyle changes are not enough to get your blood pressure under control, and if one of the following is true:  You are 10618-259 years of age, and your systolic blood pressure is 140 or higher.  You are 61 years of age or older, and your systolic blood pressure is 150 or higher.  Your diastolic blood pressure is 90 or higher.  You have diabetes, and your systolic blood pressure is over 140 or your diastolic blood pressure is over 90.  You have kidney disease, and your blood pressure is above 140/90.  You have heart disease or a history of stroke, and your blood pressure is 140/90 or higher. Take medicines only as told by your health care provider. Follow the directions carefully. Blood pressure medicines must be taken as prescribed. The medicine does not work as well when you skip doses. Skipping doses also puts you at risk for problems. Contact a health care provider if:  You think you are having a reaction to medicines you have taken.  You have repeated (recurrent) headaches.  You feel dizzy.  You have swelling in your ankles.  You have trouble with your vision. Get help right away if:  You develop a severe headache or confusion.  You have unusual weakness or numbness, or you feel faint.  You have severe pain in your chest or abdomen.  You vomit repeatedly.  You have trouble breathing. This information is not intended to replace advice given to you by your health care provider. Make sure you discuss any questions you have with your health care provider. Document Released: 05/02/2012 Document Revised: 04/12/2016 Document Reviewed: 11/06/2015 Elsevier Interactive Patient Education  2017 [employer]

## 2016-09-01 ENCOUNTER — Ambulatory Visit: Payer: Commercial Managed Care - HMO

## 2016-09-08 ENCOUNTER — Ambulatory Visit: Payer: Commercial Managed Care - HMO | Admitting: Internal Medicine

## 2016-09-19 ENCOUNTER — Encounter: Payer: Self-pay | Admitting: Family

## 2016-09-19 ENCOUNTER — Ambulatory Visit (INDEPENDENT_AMBULATORY_CARE_PROVIDER_SITE_OTHER): Payer: Medicare Other | Admitting: Family

## 2016-09-19 VITALS — BP 132/62 | HR 86 | Temp 98.2°F | Resp 16 | Ht 59.0 in | Wt 155.2 lb

## 2016-09-19 DIAGNOSIS — J209 Acute bronchitis, unspecified: Secondary | ICD-10-CM

## 2016-09-19 DIAGNOSIS — Z23 Encounter for immunization: Secondary | ICD-10-CM

## 2016-09-19 DIAGNOSIS — J440 Chronic obstructive pulmonary disease with acute lower respiratory infection: Secondary | ICD-10-CM

## 2016-09-19 MED ORDER — BENZONATATE 100 MG PO CAPS: 100.0000 mg | ORAL_CAPSULE | Freq: Three times a day (TID) | ORAL | 1 refills | Status: DC | PRN

## 2016-09-19 MED ORDER — ALBUTEROL SULFATE (2.5 MG/3ML) 0.083% IN NEBU: 2.5000 mg | INHALATION_SOLUTION | Freq: Three times a day (TID) | RESPIRATORY_TRACT | 1 refills | Status: DC | PRN

## 2016-09-19 NOTE — Progress Notes (Signed)
Subjective:    Patient ID: Patricia Fields, female    DOB: 09-29-1955, 61 y.o.   MRN: [id number]  CC: Patricia Fields is a 61 y.o. female who presents today for an acute visit.    HPI: CC: productive cough, congestion one week and half. Grandchildren at home are sick as well.  NO fever, chills, SOB, wheezing. Endorses sinus pressure. Haven't taken any medication. Cannot take mucinex  H/o COPD; smoker  Last seen 06/2016 for bronchitis    HISTORY:  Past Medical History:  Diagnosis Date  . 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 Chronic kidney disease    renal stent  . COPD (chronic obstructive pulmonary disease) (HCC)   . History of nephrolithiasis   . HOH (hard of hearing)   . Hyperlipidemia   . Hypertension   . Interstitial cystitis   . Meniere disease   . Peripheral arterial disease [facility]) Feb 2011   by renal artery duplex  . Tobacco abuse    one pack daily   Past Surgical History:  Procedure Laterality Date  . ABDOMINAL HYSTERECTOMY    . arthroscopy     left knee , remote  . BREAST SURGERY     left breast cyst, benign  . CARPAL TUNNEL RELEASE     Dr. Hyacinth Meeker,  right hand   . CATARACT EXTRACTION W/PHACO Right 03/05/2015   Procedure: CATARACT EXTRACTION PHACO AND INTRAOCULAR LENS PLACEMENT (IOC);  Surgeon: Lia Hopping, MD;  Location: [facility] ORS;  Service: Ophthalmology;  Laterality: Right;  [place]: 00:52.3 AP%: 15.2 CDE: 7.96  . KNEE ARTHROSCOPY Left   . TOTAL ABDOMINAL HYSTERECTOMY W/ BILATERAL SALPINGOOPHORECTOMY  1992   due to uterine tumor, and endometriosis    Family History  Problem Relation Age of Onset  . Coronary artery disease Mother   . COPD Mother   . Heart disease Mother     Allergies: Bee venom and Tomato Current Outpatient Prescriptions on File Prior to Visit  Medication Sig Dispense Refill  . Albuterol Sulfate 108 (90 Base) MCG/ACT AEPB Inhale 1 puff into the lungs every 8 (eight) hours as needed. For wheezing 3 each 0  .  amitriptyline (ELAVIL) 25 MG tablet Take 1 tablet (25 mg total) by mouth at bedtime. 30 tablet 1  . azithromycin (ZITHROMAX) 250 MG tablet Tale 500 mg PO on day 1, then 250 mg PO q24h x 4 days. 6 tablet 0  . buPROPion (WELLBUTRIN SR) 150 MG 12 hr tablet TAKE ONE TABLET BY MOUTH TWICE DAILY 180 tablet 0  . Cholecalciferol (VITAMIN D3) 1000 UNITS CAPS Take by mouth.      . ciprofloxacin (CIPRO) 250 MG tablet Take 1 tablet (250 mg total) by mouth 2 (two) times daily. 6 tablet 0  . ergocalciferol (DRISDOL) 50000 UNITS capsule Take 1 capsule (50,000 Units total) by mouth once a week. 12 capsule 0  . losartan-hydrochlorothiazide (HYZAAR) 100-25 MG tablet Take 1 tablet by mouth daily. 90 tablet 1  . metoprolol succinate (TOPROL-XL) 50 MG 24 hr tablet Take 1 tablet (50 mg total) by mouth daily. 90 tablet 1  . ondansetron (ZOFRAN) 8 MG tablet Take 1 tablet (8 mg total) by mouth every 8 (eight) hours as needed for nausea or vomiting. 30 tablet 0  . PARoxetine (PAXIL) 10 MG tablet TAKE ONE TABLET BY MOUTH IN THE MORNING 30 tablet 3  . pentosan polysulfate (ELMIRON) 100 MG capsule Take 1 capsule (100 mg total) by mouth 3 (three) times  daily. 90 capsule 1  . simvastatin (ZOCOR) 20 MG tablet Take 1 tablet (20 mg total) by mouth at bedtime. 90 tablet 3  . traMADol (ULTRAM) 50 MG tablet TAKE ONE TABLET BY MOUTH EVERY 6 HOURS AS NEEDED 120 tablet 0  . varenicline (CHANTIX STARTING MONTH PAK) 0.5 MG X 11 & 1 MG X 42 tablet Take one 0.5 mg tablet by mouth once daily for 3 days, then increase to one 0.5 mg tablet twice daily for 4 days, then increase to one 1 mg tablet twice daily. 53 tablet 0  . vitamin E 100 UNIT capsule Take 100 Units by mouth daily.     Current Facility-Administered Medications on File Prior to Visit  Medication Dose Route Frequency Provider Last Rate Last Dose  . ipratropium-albuterol (DUONEB) 0.5-2.5 (3) MG/3ML nebulizer solution 3 mL  3 mL Nebulization Q6H Allegra GranaMargaret G Travor Royce, FNP         Social History  Substance Use Topics  . Smoking status: Current Every Day Smoker    Packs/day: 1.00    Types: Cigarettes  . Smokeless tobacco: Never Used  . Alcohol use No    Review of Systems  Constitutional: Negative for chills and fever.  HENT: Positive for congestion.   Respiratory: Positive for cough. Negative for shortness of breath and wheezing.   Cardiovascular: Negative for chest pain and palpitations.  Gastrointestinal: Negative for nausea and vomiting.      Objective:    BP 132/62   Pulse 86   Temp 98.2 F (36.8 C) (Oral)   Resp 16   Ht 4\' 11"  (1.499 m)   Wt 155 lb 3.2 oz (70.4 kg)   BMI 31.35 kg/m    Physical Exam  Constitutional: She appears well-developed and well-nourished.  HENT:  Head: Normocephalic and atraumatic.  Right Ear: Hearing, tympanic membrane, external ear and ear canal normal. No drainage, swelling or tenderness. No foreign bodies. Tympanic membrane is not erythematous and not bulging. No middle ear effusion. No decreased hearing is noted.  Left Ear: Hearing, tympanic membrane, external ear and ear canal normal. No drainage, swelling or tenderness. No foreign bodies. Tympanic membrane is not erythematous and not bulging.  No middle ear effusion. No decreased hearing is noted.  Nose: Nose normal. No rhinorrhea. Right sinus exhibits no maxillary sinus tenderness and no frontal sinus tenderness. Left sinus exhibits no maxillary sinus tenderness and no frontal sinus tenderness.  Mouth/Throat: Uvula is midline, oropharynx is clear and moist and mucous membranes are normal. No oropharyngeal exudate, posterior oropharyngeal edema, posterior oropharyngeal erythema or tonsillar abscesses.  Eyes: Conjunctivae are normal.  Cardiovascular: Regular rhythm, normal heart sounds and normal pulses.   Pulmonary/Chest: Effort normal. She has decreased breath sounds in the right lower field and the left lower field. She has no wheezes. She has no rhonchi. She  has no rales.  Lymphadenopathy:       Head (right side): No submental, no submandibular, no tonsillar, no preauricular, no posterior auricular and no occipital adenopathy present.       Head (left side): No submental, no submandibular, no tonsillar, no preauricular, no posterior auricular and no occipital adenopathy present.    She has no cervical adenopathy.  Neurological: She is alert.  Skin: Skin is warm and dry.  Psychiatric: She has a normal mood and affect. Her speech is normal and behavior is normal. Thought content normal.  Vitals reviewed.  Patient felt significantly better after albuterol treatment. Lung sounds clear and increased  Assessment & Plan:   1. Acute bronchitis with COPD (HCC) Sa02 97. Improved significantly with nebulizer and would like for home. Ordered with albuterol solution. We jointly agreed on conservative management and no antibiotics at this time. Return precautions given.                         I am having Ms. Grego maintain her Vitamin D3, vitamin E, ondansetron, ergocalciferol, varenicline, simvastatin, buPROPion, traMADol, ciprofloxacin, pentosan polysulfate, amitriptyline, PARoxetine, losartan-hydrochlorothiazide, azithromycin, Albuterol Sulfate, and metoprolol succinate. We will continue to administer ipratropium-albuterol.   No orders of the defined types were placed in this encounter.   Return precautions given.   Risks, benefits, and alternatives of the medications and treatment plan prescribed today were discussed, and patient expressed understanding.   Education regarding symptom management and diagnosis given to patient on AVS.  Continue to follow with TULLO, Mar Daring, MD for routine health maintenance.   Patricia Fields and I agreed with plan.   Rennie Plowman, FNP

## 2016-09-19 NOTE — Progress Notes (Signed)
Pre visit review using our clinic review tool, if applicable. No additional management support is needed unless otherwise documented below in the visit note. 

## 2016-09-19 NOTE — Patient Instructions (Signed)
Nebulizer machine ordered  Cough medication as needed  Do you use albuterol INHALER AND NEBULIZER at same time. They are the same medication. You may find that you only use your inhaler if you are out and about and need to carry something in your purse.   Let me know if you are not better

## 2016-09-21 ENCOUNTER — Telehealth: Payer: Self-pay | Admitting: Internal Medicine

## 2016-09-21 NOTE — Telephone Encounter (Signed)
Pt called about needing a nebulizer machine for the liquid medication. Pt does not have the machine. Please advise?  Call pt @ [phone]. Thank you!

## 2016-09-21 NOTE — Telephone Encounter (Signed)
Pt stated that she had a medication called in for her cough, however she stated that her insurance will not cover the medication. Pt requested a call [phone]

## 2016-09-21 NOTE — Telephone Encounter (Signed)
[employer] order form given to PCP for review/signature. Will fax back to [facility] and call pt once signed.

## 2016-09-21 NOTE — Telephone Encounter (Signed)
Thanks Patricia Fields  Patient may use honey or OTC delsym.

## 2016-09-21 NOTE — Telephone Encounter (Signed)
I called [facility] and spoke to B[employer]on, Dickinson and CompanyJames Bryant. He states as long as we fax order over today with pt's demographics, she will receive her nebulizer today. I faxed nebulizer order form with pt demographics to [phone]. Pt informed.   While on the phone, the pt states her pharmacy advised her that her plan does not cover the "cough" med. I assuming she means the benzonatate. She is requesting a cheaper alternative.

## 2016-09-22 NOTE — Telephone Encounter (Signed)
[name] with [facility] came by to let you know that [name] BlazingSmith refused the nebulizer machine because she couldn't afford the 20% copay for it..[name]

## 2016-09-22 NOTE — Telephone Encounter (Signed)
Pt informed. She states she has not heard from [facility] re: nebulizer machine.  I called [employer] rep, [name] and advised him of this and that pt is at home now and states she will be all day today. He states he will have his staff reach out to pt again. I informed him pt needs her machine today.

## 2016-09-27 DIAGNOSIS — J449 Chronic obstructive pulmonary disease, unspecified: Secondary | ICD-10-CM | POA: Diagnosis not present

## 2016-09-30 ENCOUNTER — Ambulatory Visit
Admission: RE | Admit: 2016-09-30 | Discharge: 2016-09-30 | Disposition: A | Discharge status: Home / Self Care | Payer: Medicare Other | Source: Ambulatory Visit | Attending: Family | Admitting: Family

## 2016-09-30 ENCOUNTER — Encounter: Payer: Self-pay | Admitting: Radiology

## 2016-09-30 DIAGNOSIS — Z1231 Encounter for screening mammogram for malignant neoplasm of breast: Secondary | ICD-10-CM | POA: Insufficient documentation

## 2016-09-30 DIAGNOSIS — Z1239 Encounter for other screening for malignant neoplasm of breast: Secondary | ICD-10-CM

## 2016-09-30 LAB — HM MAMMOGRAPHY

## 2016-09-30 NOTE — Telephone Encounter (Signed)
I called pt re: needing a nebulizer from [employer]. We have some in office and her potential OOP cost is around $20. I called pt to inform and  I spoke to her daughter and she states pt has already received a neb machine that was delivered.

## 2016-10-03 DIAGNOSIS — Z7689 Persons encountering health services in other specified circumstances: Secondary | ICD-10-CM

## 2016-10-04 ENCOUNTER — Encounter: Payer: Self-pay | Admitting: *Deleted

## 2016-10-13 ENCOUNTER — Encounter: Payer: Self-pay | Admitting: Internal Medicine

## 2016-10-13 ENCOUNTER — Ambulatory Visit (INDEPENDENT_AMBULATORY_CARE_PROVIDER_SITE_OTHER): Payer: Medicare Other | Admitting: Internal Medicine

## 2016-10-13 VITALS — BP 120/84 | HR 71 | Resp 16 | Wt 151.0 lb

## 2016-10-13 DIAGNOSIS — R7301 Impaired fasting glucose: Secondary | ICD-10-CM | POA: Diagnosis not present

## 2016-10-13 DIAGNOSIS — E669 Obesity, unspecified: Secondary | ICD-10-CM

## 2016-10-13 DIAGNOSIS — I10 Essential (primary) hypertension: Secondary | ICD-10-CM | POA: Diagnosis not present

## 2016-10-13 DIAGNOSIS — J438 Other emphysema: Secondary | ICD-10-CM

## 2016-10-13 DIAGNOSIS — Z716 Tobacco abuse counseling: Secondary | ICD-10-CM

## 2016-10-13 DIAGNOSIS — E7800 Pure hypercholesterolemia, unspecified: Secondary | ICD-10-CM | POA: Diagnosis not present

## 2016-10-13 DIAGNOSIS — F17210 Nicotine dependence, cigarettes, uncomplicated: Secondary | ICD-10-CM | POA: Diagnosis not present

## 2016-10-13 DIAGNOSIS — R2681 Unsteadiness on feet: Secondary | ICD-10-CM | POA: Diagnosis not present

## 2016-10-13 DIAGNOSIS — Z1239 Encounter for other screening for malignant neoplasm of breast: Secondary | ICD-10-CM

## 2016-10-13 DIAGNOSIS — Z0000 Encounter for general adult medical examination without abnormal findings: Secondary | ICD-10-CM | POA: Diagnosis not present

## 2016-10-13 DIAGNOSIS — Z1231 Encounter for screening mammogram for malignant neoplasm of breast: Secondary | ICD-10-CM

## 2016-10-13 LAB — COMPREHENSIVE METABOLIC PANEL
ALK PHOS: 106 U/L (ref 39–117)
ALT: 15 U/L (ref 0–35)
AST: 13 U/L (ref 0–37)
Albumin: 4.1 g/dL (ref 3.5–5.2)
BILIRUBIN TOTAL: 0.4 mg/dL (ref 0.2–1.2)
BUN: 14 mg/dL (ref 6–23)
CALCIUM: 9.5 mg/dL (ref 8.4–10.5)
CO2: 30 meq/L (ref 19–32)
Chloride: 108 mEq/L (ref 96–112)
Creatinine, Ser: 0.93 mg/dL (ref 0.40–1.20)
GFR: 65.03 mL/min (ref >60.00)
Glucose, Bld: 97 mg/dL (ref 70–99)
Potassium: 3.4 mEq/L — ABNORMAL LOW (ref 3.5–5.1)
Sodium: 139 mEq/L (ref 135–145)
Total Protein: 7 g/dL (ref 6.0–8.3)

## 2016-10-13 LAB — LIPID PANEL
CHOLESTEROL: 236 mg/dL — AB (ref 0–200)
HDL: 38.1 mg/dL — AB (ref >39.00)
NonHDL: 197.98
TRIGLYCERIDES: 206 mg/dL — AB (ref 0.0–149.0)
Total CHOL/HDL Ratio: 6
VLDL: 41.2 mg/dL — AB (ref 0.0–40.0)

## 2016-10-13 LAB — LDL CHOLESTEROL, DIRECT: Direct LDL: 141 mg/dL

## 2016-10-13 LAB — MICROALBUMIN / CREATININE URINE RATIO
Creatinine,U: 150.8 mg/dL
Microalb Creat Ratio: 0.9 mg/g (ref 0.0–30.0)
Microalb, Ur: 1.3 mg/dL (ref 0.0–1.9)

## 2016-10-13 LAB — HEMOGLOBIN A1C: Hgb A1c MFr Bld: 6.5 % (ref 4.6–6.5)

## 2016-10-13 MED ORDER — VARENICLINE TARTRATE 1 MG PO TABS: 1.0000 mg | ORAL_TABLET | Freq: Two times a day (BID) | ORAL | 2 refills | Status: DC

## 2016-10-13 MED ORDER — VARENICLINE TARTRATE 0.5 MG X 11 & 1 MG X 42 PO MISC: ORAL | 0 refills | Status: DC

## 2016-10-13 NOTE — Progress Notes (Signed)
Pre visit review using our clinic review tool, if applicable. No additional management support is needed unless otherwise documented below in the visit note. 

## 2016-10-13 NOTE — Progress Notes (Signed)
Subjective:  Patient ID: Patricia Fields, female    DOB: [birth year]  Age: 61 y.o. MRN: [id number]  CC: There were no encounter diagnoses.  HPI Patricia Fields presents for follow up on chronic conditions including hypertension ,  Anxiety, COPD with ongoing tobacco abuse , and impaired fasting glucose.  she was last seen in March 2017 and did not return for further assessment.  She has become dependent on her family for transportation since she stopping driving due to her disabling vertigo.    She feels generally well but is tired of staying at home . She does not walk for exercise due to fear of falling. Last week had a fall that occurred at home while adjusting her TV antenna.   Fell onto  her side,  Reports that she had back pain for a days that resolved without radiation.  She refuses to use a walker,  uses a cane with 4 feet.  The most recent fall prior to the one mentioned was  6 -8 months ago.   `  Discussed getting a tricycle vs a stationery bike.   Seen 3 weeks ago for COPD exacerbation by Claris Che, resolved  with albuterol treatment,  rx for albuterol nebs given,  No steroid or antibiotics. Multiple sick contacts Lives with grandchildren who are constantly sick.   Still smoking. Less during illnesses but always resumes. discussed  Prior trial of chantix in 2016 . She feels that it was unsuccessful bc she did not take the evening dose .  Willing to try again     Has lost weight since last visit.  using an air fryer to cook all of her meats for the past 6 months.   Diet reviewed,  Still Eats some junk food daily ,  A handful daily      Outpatient Medications Prior to Visit  Medication Sig Dispense Refill  . albuterol (PROVENTIL) (2.5 MG/3ML) 0.083% nebulizer solution Take 3 mLs (2.5 mg total) by nebulization every 8 (eight) hours as needed for wheezing or shortness of breath. 150 mL 1  . Albuterol Sulfate 108 (90 Base) MCG/ACT AEPB Inhale 1 puff into the lungs every 8 (eight) hours as  needed. For wheezing 3 each 0  . amitriptyline (ELAVIL) 25 MG tablet Take 1 tablet (25 mg total) by mouth at bedtime. 30 tablet 1  . buPROPion (WELLBUTRIN SR) 150 MG 12 hr tablet TAKE ONE TABLET BY MOUTH TWICE DAILY 180 tablet 0  . Cholecalciferol (VITAMIN D3) 1000 UNITS CAPS Take by mouth.      . ergocalciferol (DRISDOL) 50000 UNITS capsule Take 1 capsule (50,000 Units total) by mouth once a week. 12 capsule 0  . losartan-hydrochlorothiazide (HYZAAR) 100-25 MG tablet Take 1 tablet by mouth daily. 90 tablet 1  . metoprolol succinate (TOPROL-XL) 50 MG 24 hr tablet Take 1 tablet (50 mg total) by mouth daily. 90 tablet 1  . ondansetron (ZOFRAN) 8 MG tablet Take 1 tablet (8 mg total) by mouth every 8 (eight) hours as needed for nausea or vomiting. 30 tablet 0  . PARoxetine (PAXIL) 10 MG tablet TAKE ONE TABLET BY MOUTH IN THE MORNING 30 tablet 3  . pentosan polysulfate (ELMIRON) 100 MG capsule Take 1 capsule (100 mg total) by mouth 3 (three) times daily. 90 capsule 1  . simvastatin (ZOCOR) 20 MG tablet Take 1 tablet (20 mg total) by mouth at bedtime. 90 tablet 3  . traMADol (ULTRAM) 50 MG tablet TAKE ONE TABLET BY MOUTH EVERY 6  HOURS AS NEEDED 120 tablet 0  . varenicline (CHANTIX STARTING MONTH PAK) 0.5 MG X 11 & 1 MG X 42 tablet Take one 0.5 mg tablet by mouth once daily for 3 days, then increase to one 0.5 mg tablet twice daily for 4 days, then increase to one 1 mg tablet twice daily. 53 tablet 0  . vitamin E 100 UNIT capsule Take 100 Units by mouth daily.    [name] azithromycin (ZITHROMAX) 250 MG tablet Tale 500 mg PO on day 1, then 250 mg PO q24h x 4 days. 6 tablet 0  . benzonatate (TESSALON PERLES) 100 MG capsule Take 1 capsule (100 mg total) by mouth 3 (three) times daily as needed for cough. 30 capsule 1  . ciprofloxacin (CIPRO) 250 MG tablet Take 1 tablet (250 mg total) by mouth 2 (two) times daily. 6 tablet 0   Facility-Administered Medications Prior to Visit  Medication Dose Route Frequency  Provider Last Rate Last Dose  . ipratropium-albuterol (DUONEB) 0.5-2.5 (3) MG/3ML nebulizer solution 3 mL  3 mL Nebulization Q6H Allegra Grana, FNP        Review of Systems;  Patient denies headache, fevers, malaise, unintentional weight loss, skin rash, eye pain, sinus congestion and sinus pain, sore throat, dysphagia,  hemoptysis , cough, dyspnea, wheezing, chest pain, palpitations, orthopnea, edema, abdominal pain, nausea, melena, diarrhea, constipation, flank pain, dysuria, hematuria, urinary  Frequency, nocturia, numbness, tingling, seizures,  Focal weakness, Loss of consciousness,  Tremor, insomnia, depression, anxiety, and suicidal ideation.      Objective:  BP 120/84   Pulse 71   Resp 16   Wt 151 lb (68.5 kg)   SpO2 96%   BMI 30.50 kg/m   BP Readings from Last 3 Encounters:  10/13/16 120/84  09/19/16 132/62  07/26/16 (!) 158/82    Wt Readings from Last 3 Encounters:  10/13/16 151 lb (68.5 kg)  09/19/16 155 lb 3.2 oz (70.4 kg)  07/26/16 153 lb 9.6 oz (69.7 kg)    General appearance: alert, cooperative and appears  MUCH OLDER THAN stated age Ears: normal TM's and external ear canals both ears Throat: lips, mucosa, and tongue normal; poor dentition , gums normal Neck: no adenopathy, no carotid bruit, supple, symmetrical, trachea midline and thyroid not enlarged, symmetric, no tenderness/mass/nodules Back: symmetric, no curvature. ROM normal. No CVA tenderness. Lungs: clear to auscultation bilaterally Heart: regular rate and rhythm, S1, S2 normal, no murmur, click, rub or gallop Abdomen: soft, non-tender; bowel sounds normal; no masses,  no organomegaly Pulses: 2+ and symmetric Skin: Skin color, texture, turgor normal. No rashes or lesions Lymph nodes: Cervical, supraclavicular, and axillary nodes normal.  Lab Results  Component Value Date   HGBA1C 6.0 01/25/2012    Lab Results  Component Value Date   CREATININE 1.12 11/06/2015   CREATININE 1.02 02/11/2015     CREATININE 1.0 07/21/2014    Lab Results  Component Value Date   WBC 8.5 08/14/2014   HGB 14.6 08/14/2014   HCT 43.1 08/14/2014   PLT 293.0 08/14/2014   GLUCOSE 120 (H) 11/06/2015   CHOL 210 (H) 11/06/2015   TRIG 130.0 11/06/2015   HDL 45.40 11/06/2015   LDLDIRECT 163.0 02/11/2015   LDLCALC 138 (H) 11/06/2015   ALT 21 11/06/2015   AST 15 11/06/2015   NA 138 11/06/2015   K 3.4 (L) 11/06/2015   CL 99 11/06/2015   CREATININE 1.12 11/06/2015   BUN 15 11/06/2015   CO2 28 11/06/2015   TSH 1.26 08/14/2014  HGBA1C 6.0 01/25/2012    Mm Screening Breast Tomo Bilateral  Result Date: 09/30/2016 CLINICAL DATA:  Screening. EXAM: 2D DIGITAL SCREENING BILATERAL MAMMOGRAM WITH CAD AND ADJUNCT TOMO COMPARISON:  Previous exam(s). ACR Breast Density Category c: The breast tissue is heterogeneously dense, which may obscure small masses. FINDINGS: There are no findings suspicious for malignancy. Images were processed with CAD. IMPRESSION: No mammographic evidence of malignancy. A result letter of this screening mammogram will be mailed directly to the patient. RECOMMENDATION: Screening mammogram in one year. (Code:SM-B-01Y) BI-RADS CATEGORY  1: Negative. Electronically Signed   By: Britta MccreedySusan  Turner M.D.   On: 09/30/2016 18:03    Assessment & Plan:   Problem List Items Addressed This Visit    None      I have discontinued Patricia Fields's ciprofloxacin, azithromycin, and benzonatate. I am also having her maintain her Vitamin D3, vitamin E, ondansetron, ergocalciferol, varenicline, simvastatin, buPROPion, traMADol, pentosan polysulfate, amitriptyline, PARoxetine, losartan-hydrochlorothiazide, Albuterol Sulfate, metoprolol succinate, and albuterol. We will continue to administer ipratropium-albuterol.  No orders of the defined types were placed in this encounter.   Medications Discontinued During This Encounter  Medication Reason  . azithromycin (ZITHROMAX) 250 MG tablet Completed Course  .  benzonatate (TESSALON PERLES) 100 MG capsule Patient has not taken in last 30 days  . ciprofloxacin (CIPRO) 250 MG tablet Completed Course    Follow-up: No Follow-up on file.   Sherlene ShamsULLO, Neddie Steedman L, MD

## 2016-10-13 NOTE — Patient Instructions (Addendum)
I am screening you for diabetes today All snacks are fine except for tortilla chips,  potat chips and pretzels.   I will initiate the order for your annual colon cancer screening  Test.  It is called  Cologuard.  It will be delivered to your house, and you will send off a stool sample in the envelope it provides.

## 2016-10-15 ENCOUNTER — Encounter: Payer: Self-pay | Admitting: Internal Medicine

## 2016-10-15 DIAGNOSIS — E1121 Type 2 diabetes mellitus with diabetic nephropathy: Secondary | ICD-10-CM | POA: Insufficient documentation

## 2016-10-15 DIAGNOSIS — E1151 Type 2 diabetes mellitus with diabetic peripheral angiopathy without gangrene: Secondary | ICD-10-CM | POA: Insufficient documentation

## 2016-10-15 DIAGNOSIS — Z1239 Encounter for other screening for malignant neoplasm of breast: Secondary | ICD-10-CM | POA: Insufficient documentation

## 2016-10-15 NOTE — Assessment & Plan Note (Signed)
Spent 3 minutes discussing risk of continued tobacco abuse, including but not limited to CAD, PAD, hypertension, and CA.  sHe is  interested in resuming pharmacotherapy at this time with repeat trial of Chantix.

## 2016-10-15 NOTE — Assessment & Plan Note (Signed)
She continues to require use of a cane for stability secondary to chronic vertigo.  She does not drive  And has had several falls at home.  She refuses a walker

## 2016-10-15 NOTE — Assessment & Plan Note (Signed)
Annual comprehensive preventive exam was done as well as an evaluation and management of chronic conditions .  During the course of the visit the patient was educated and counseled about appropriate screening and preventive services including :  diabetes screening, lipid analysis with projected  10 year  risk for CAD , nutrition counseling, breast, cervical and colorectal cancer screening, and recommended immunizations.  Printed recommendations for health maintenance screenings was given 

## 2016-10-15 NOTE — Assessment & Plan Note (Signed)
I have congratulated her in reduction of   BMI and encouraged  Continued weight loss with goal of 10% of body weigh over the next 6 months using a low glycemic index diet and regular exercise using a stationery bike or tricycle 5 days per week.

## 2016-10-15 NOTE — Assessment & Plan Note (Signed)
Mammogram ordered

## 2016-10-15 NOTE — Assessment & Plan Note (Addendum)
Breathing is back to baseline. Encouraged to quit smoking. Has home neb for albuterol treatments prn  But cannot afford advair or other maintenance inhalers.

## 2016-10-15 NOTE — Assessment & Plan Note (Signed)
Noted last year with fasting glucose of 120.  Her .a1c is diagnostic of diabetes  But her bs are not high enough to consider therapy.  Low gi diet,  Baby aspirin and statin therapy advised,  She has statin intolerance.  Will consider trial of zetia,  Lab Results  Component Value Date   HGBA1C 6.5 10/13/2016   Lab Results  Component Value Date   MICROALBUR 1.3 10/13/2016

## 2016-10-15 NOTE — Assessment & Plan Note (Addendum)
cologuard ordered today

## 2016-10-17 ENCOUNTER — Other Ambulatory Visit: Payer: Self-pay | Admitting: Internal Medicine

## 2016-10-17 ENCOUNTER — Telehealth: Payer: Self-pay | Admitting: *Deleted

## 2016-10-17 MED ORDER — NICOTINE 21 MG/24HR TD PT24: 21.0000 mg | MEDICATED_PATCH | Freq: Every day | TRANSDERMAL | 3 refills | Status: DC

## 2016-10-17 NOTE — Telephone Encounter (Signed)
Pt was prescribed Chantix, however it is expensive. Pt requested an alternative  Pt contact [phone]

## 2016-10-17 NOTE — Telephone Encounter (Signed)
There is no alternative other than wellbutrin , which she is already taking

## 2016-10-17 NOTE — Telephone Encounter (Signed)
Pt informed

## 2016-10-18 NOTE — Telephone Encounter (Signed)
Pt states that she would like for you to call her back in regards to what was spoke about previously..[name]

## 2016-10-19 ENCOUNTER — Telehealth: Payer: Self-pay | Admitting: Internal Medicine

## 2016-10-19 MED ORDER — BUPROPION HCL ER (SR) 150 MG PO TB12: 150.0000 mg | ORAL_TABLET | Freq: Two times a day (BID) | ORAL | 1 refills | Status: DC

## 2016-10-19 NOTE — Telephone Encounter (Signed)
Rx sent. See meds.  

## 2016-10-19 NOTE — Telephone Encounter (Signed)
Script for Wellbutrin sent to [facility].

## 2016-10-19 NOTE — Telephone Encounter (Signed)
Pt called and stated that she needs a new rx for buPROPion (WELLBUTRIN SR) 150 MG 12 hr tablet. Pt would liek a call. Please advise, thank you!  [facility] [place] [id number]  Call pt @ [phone]

## 2016-10-19 NOTE — Addendum Note (Signed)
Addended by: Dennie BibleAVIS, KATHY R on: 10/19/2016 09:56 AM   Modules accepted: Orders

## 2016-10-24 ENCOUNTER — Telehealth: Payer: Self-pay | Admitting: *Deleted

## 2016-10-24 DIAGNOSIS — I10 Essential (primary) hypertension: Secondary | ICD-10-CM

## 2016-10-24 MED ORDER — PAROXETINE HCL 10 MG PO TABS: 10.0000 mg | ORAL_TABLET | Freq: Every morning | ORAL | 1 refills | Status: DC

## 2016-10-24 MED ORDER — METOPROLOL SUCCINATE ER 50 MG PO TB24: 50.0000 mg | ORAL_TABLET | Freq: Every day | ORAL | 1 refills | Status: DC

## 2016-10-24 MED ORDER — LOSARTAN POTASSIUM-HCTZ 100-25 MG PO TABS: 1.0000 | ORAL_TABLET | Freq: Every day | ORAL | 1 refills | Status: DC

## 2016-10-24 NOTE — Telephone Encounter (Signed)
Rxs sent. See meds.  

## 2016-10-24 NOTE — Telephone Encounter (Signed)
Requested medication refill for :losartan, paroxetine and metoprolol Pharmacy: [facility] Patient Contact: [phone]

## 2016-11-28 ENCOUNTER — Ambulatory Visit (INDEPENDENT_AMBULATORY_CARE_PROVIDER_SITE_OTHER): Payer: Medicare Other | Admitting: Internal Medicine

## 2016-11-28 ENCOUNTER — Encounter: Payer: Self-pay | Admitting: Internal Medicine

## 2016-11-28 VITALS — BP 178/86 | HR 83 | Temp 97.8°F | Resp 16 | Ht 59.0 in | Wt 154.2 lb

## 2016-11-28 DIAGNOSIS — F17210 Nicotine dependence, cigarettes, uncomplicated: Secondary | ICD-10-CM | POA: Diagnosis not present

## 2016-11-28 DIAGNOSIS — Z79899 Other long term (current) drug therapy: Secondary | ICD-10-CM | POA: Diagnosis not present

## 2016-11-28 DIAGNOSIS — M79675 Pain in left toe(s): Secondary | ICD-10-CM | POA: Diagnosis not present

## 2016-11-28 DIAGNOSIS — I150 Renovascular hypertension: Secondary | ICD-10-CM

## 2016-11-28 DIAGNOSIS — E782 Mixed hyperlipidemia: Secondary | ICD-10-CM

## 2016-11-28 DIAGNOSIS — H8120 Vestibular neuronitis, unspecified ear: Secondary | ICD-10-CM

## 2016-11-28 DIAGNOSIS — Z716 Tobacco abuse counseling: Secondary | ICD-10-CM | POA: Diagnosis not present

## 2016-11-28 DIAGNOSIS — E1151 Type 2 diabetes mellitus with diabetic peripheral angiopathy without gangrene: Secondary | ICD-10-CM

## 2016-11-28 DIAGNOSIS — I701 Atherosclerosis of renal artery: Secondary | ICD-10-CM | POA: Diagnosis not present

## 2016-11-28 MED ORDER — AMLODIPINE BESYLATE 2.5 MG PO TABS: 2.5000 mg | ORAL_TABLET | Freq: Every day | ORAL | 3 refills | Status: DC

## 2016-11-28 MED ORDER — ATORVASTATIN CALCIUM 40 MG PO TABS: 40.0000 mg | ORAL_TABLET | Freq: Every day | ORAL | 3 refills | Status: DC

## 2016-11-28 NOTE — Progress Notes (Signed)
Subjective:  Patient ID: Patricia Fields, female    DOB: 03-07-1956  Age: 61 y.o. MRN: [id number]  CC: The primary encounter diagnosis was Long-term use of high-risk medication. Diagnoses of Toe pain, left, Renal artery stenosis (HCC), DM (diabetes mellitus), type 2 with peripheral vascular complications (HCC), Renovascular hypertension, Subacute vestibular neuronitis, unspecified laterality, Tobacco abuse counseling, and Mixed hyperlipidemia were also pertinent to this visit.  HPI Patricia Fields presents for follow up on newly diagnosed type 2 diabetes,  Hypertension,  hyperlipidemia and tobacco abuse   Still smoking?  Yes, Insurance did not cover chantix. Plans to try to quit in the near future using  Nicoderm patches   Lab Results  Component Value Date   HGBA1C 6.5 10/13/2016     bp elevated. Told she snores,  No sleep study in years.  Wakes up feeling refreshed ,  No daytime somnolence  recurrent dizzy spells but no falls,  Uses her cane.    History of RAS  s/p stenting at [facility]   Outpatient Medications Prior to Visit  Medication Sig Dispense Refill  . albuterol (PROVENTIL) (2.5 MG/3ML) 0.083% nebulizer solution Take 3 mLs (2.5 mg total) by nebulization every 8 (eight) hours as needed for wheezing or shortness of breath. 150 mL 1  . Albuterol Sulfate 108 (90 Base) MCG/ACT AEPB Inhale 1 puff into the lungs every 8 (eight) hours as needed. For wheezing 3 each 0  . amitriptyline (ELAVIL) 25 MG tablet Take 1 tablet (25 mg total) by mouth at bedtime. 30 tablet 1  . buPROPion (WELLBUTRIN SR) 150 MG 12 hr tablet Take 1 tablet (150 mg total) by mouth 2 (two) times daily. 180 tablet 1  . Cholecalciferol (VITAMIN D3) 1000 UNITS CAPS Take by mouth.      . ergocalciferol (DRISDOL) 50000 UNITS capsule Take 1 capsule (50,000 Units total) by mouth once a week. 12 capsule 0  . losartan-hydrochlorothiazide (HYZAAR) 100-25 MG tablet Take 1 tablet by mouth daily. 90 tablet 1  . metoprolol  succinate (TOPROL-XL) 50 MG 24 hr tablet Take 1 tablet (50 mg total) by mouth daily. 90 tablet 1  . nicotine (NICODERM CQ - DOSED IN MG/24 HOURS) 21 mg/24hr patch Place 1 patch (21 mg total) onto the skin daily. 28 patch 3  . ondansetron (ZOFRAN) 8 MG tablet Take 1 tablet (8 mg total) by mouth every 8 (eight) hours as needed for nausea or vomiting. 30 tablet 0  . PARoxetine (PAXIL) 10 MG tablet Take 1 tablet (10 mg total) by mouth every morning. 90 tablet 1  . pentosan polysulfate (ELMIRON) 100 MG capsule Take 1 capsule (100 mg total) by mouth 3 (three) times daily. 90 capsule 1  . traMADol (ULTRAM) 50 MG tablet TAKE ONE TABLET BY MOUTH EVERY 6 HOURS AS NEEDED 120 tablet 0  . vitamin E 100 UNIT capsule Take 100 Units by mouth daily.    . simvastatin (ZOCOR) 20 MG tablet Take 1 tablet (20 mg total) by mouth at bedtime. 90 tablet 3  . varenicline (CHANTIX CONTINUING MONTH PAK) 1 MG tablet Take 1 tablet (1 mg total) by mouth 2 (two) times daily. (Patient not taking: Reported on 11/28/2016) 60 tablet 2  . varenicline (CHANTIX STARTING MONTH PAK) 0.5 MG X 11 & 1 MG X 42 tablet Take one 0.5 mg tablet by mouth once daily for 3 days, then increase to one 0.5 mg tablet twice daily for 4 days, then increase to one 1 mg tablet twice daily. (  Patient not taking: Reported on 11/28/2016) 53 tablet 0   Facility-Administered Medications Prior to Visit  Medication Dose Route Frequency Provider Last Rate Last Dose  . ipratropium-albuterol (DUONEB) 0.5-2.5 (3) MG/3ML nebulizer solution 3 mL  3 mL Nebulization Q6H Allegra Grana, FNP        Review of Systems;  Patient denies headache, fevers, malaise, unintentional weight loss, skin rash, eye pain, sinus congestion and sinus pain, sore throat, dysphagia,  hemoptysis , cough, dyspnea, wheezing, chest pain, palpitations, orthopnea, edema, abdominal pain, nausea, melena, diarrhea, constipation, flank pain, dysuria, hematuria, urinary  Frequency, nocturia, numbness,  tingling, seizures,  Focal weakness, Loss of consciousness,  Tremor, insomnia, depression, anxiety, and suicidal ideation.      Objective:  BP (!) 178/86   Pulse 83   Temp 97.8 F (36.6 C) (Oral)   Resp 16   Ht  (1.499 m)   Wt 154 lb 3.2 oz (69.9 kg)   SpO2 96%   BMI 31.14 kg/m   BP Readings from Last 3 Encounters:  11/28/16 (!) 178/86  10/13/16 120/84  09/19/16 132/62    Wt Readings from Last 3 Encounters:  11/28/16 154 lb 3.2 oz (69.9 kg)  10/13/16 151 lb (68.5 kg)  09/19/16 155 lb 3.2 oz (70.4 kg)    General appearance: alert, cooperative and appears stated age Ears: normal TM's and external ear canals both ears Throat: lips, mucosa, and tongue normal; teeth and gums normal Neck: no adenopathy, no carotid bruit, supple, symmetrical, trachea midline and thyroid not enlarged, symmetric, no tenderness/mass/nodules Back: symmetric, no curvature. ROM normal. No CVA tenderness. Lungs: clear to auscultation bilaterally Heart: regular rate and rhythm, S1, S2 normal, no murmur, click, rub or gallop Abdomen: soft, non-tender; bowel sounds normal; no masses,  no organomegaly Pulses: 2+ and symmetric Skin: Skin color, texture, turgor normal. No rashes or lesions Lymph nodes: Cervical, supraclavicular, and axillary nodes normal.  Lab Results  Component Value Date   HGBA1C 6.5 10/13/2016   HGBA1C 6.0 01/25/2012    Lab Results  Component Value Date   CREATININE 0.93 10/13/2016   CREATININE 1.12 11/06/2015   CREATININE 1.02 02/11/2015    Lab Results  Component Value Date   WBC 8.5 08/14/2014   HGB 14.6 08/14/2014   HCT 43.1 08/14/2014   PLT 293.0 08/14/2014   GLUCOSE 97 10/13/2016   CHOL 236 (H) 10/13/2016   TRIG 206.0 (H) 10/13/2016   HDL 38.10 (L) 10/13/2016   LDLDIRECT 141.0 10/13/2016   LDLCALC 138 (H) 11/06/2015   ALT 15 10/13/2016   AST 13 10/13/2016   NA 139 10/13/2016   K 3.4 (L) 10/13/2016   CL 108 10/13/2016   CREATININE 0.93 10/13/2016   BUN  14 10/13/2016   CO2 30 10/13/2016   TSH 1.26 08/14/2014   HGBA1C 6.5 10/13/2016   MICROALBUR 1.3 10/13/2016    Mm Screening Breast Tomo Bilateral  Result Date: 09/30/2016 CLINICAL DATA:  Screening. EXAM: 2D DIGITAL SCREENING BILATERAL MAMMOGRAM WITH CAD AND ADJUNCT TOMO COMPARISON:  Previous exam(s). ACR Breast Density Category c: The breast tissue is heterogeneously dense, which may obscure small masses. FINDINGS: There are no findings suspicious for malignancy. Images were processed with CAD. IMPRESSION: No mammographic evidence of malignancy. A result letter of this screening mammogram will be mailed directly to the patient. RECOMMENDATION: Screening mammogram in one year. (Code:SM-B-01Y) BI-RADS CATEGORY  1: Negative. Electronically Signed   By: Britta Mccreedy M.D.   On: 09/30/2016 18:03    Assessment &  Plan:   Problem List Items Addressed This Visit    DM (diabetes mellitus), type 2 with peripheral vascular complications (HCC)    New diagnosis,  With escalating hypertension and history suggestive of RAS.  Referring to [facility] for renal artery doppler.  Smoking cessation advised.  Aspirin advised,  Statin changed to lipitor.   Lab Results  Component Value Date   HGBA1C 6.5 10/13/2016   Lab Results  Component Value Date   ALT 15 10/13/2016   AST 13 10/13/2016   ALKPHOS 106 10/13/2016   BILITOT 0.4 10/13/2016   Lab Results  Component Value Date   CREATININE 0.93 10/13/2016   Lab Results  Component Value Date   MICROALBUR 1.3 10/13/2016         Relevant Medications   atorvastatin (LIPITOR) 40 MG tablet   Hyperlipidemia    Changing statinfrom simvastatin to generic Lipitor given elevated risk , or goal LDL < 70.  Lab Results  Component Value Date   CHOL 236 (H) 10/13/2016   HDL 38.10 (L) 10/13/2016   LDLCALC 138 (H) 11/06/2015   LDLDIRECT 141.0 10/13/2016   TRIG 206.0 (H) 10/13/2016   CHOLHDL 6 10/13/2016         Relevant Medications   amLODipine (NORVASC) 2.5 MG  tablet   atorvastatin (LIPITOR) 40 MG tablet   Long-term use of high-risk medication - Primary   Relevant Orders   Comprehensive metabolic panel   Renovascular hypertension    Adding amlodipine today.  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  sHe is  interested in resuming pharmacotherapy at this time with Nicoderm patches        Other Visit Diagnoses    Toe pain, left       Relevant Orders   Ambulatory referral to Podiatry   Renal artery stenosis (HCC)       Relevant Medications   amLODipine (NORVASC) 2.5 MG tablet   atorvastatin (LIPITOR) 40 MG tablet   Other Relevant Orders   Ambulatory referral to Vascular Surgery    A total of 40 minutes was spent with patient more than half of which was spent in counseling patient on the above mentioned issues , reviewing and explaining recent labs and imaging studies done, and coordination of care.  I have discontinued Ms. Solorzano's simvastatin, varenicline, and varenicline. I am also having her start on amLODipine and atorvastatin. Additionally, I am having her maintain her Vitamin D3, vitamin E, ondansetron, ergocalciferol, traMADol, pentosan polysulfate, amitriptyline, Albuterol Sulfate, albuterol, nicotine, buPROPion, losartan-hydrochlorothiazide, metoprolol succinate, and PARoxetine. We will continue to administer ipratropium-albuterol.  Meds ordered this encounter  Medications  . amLODipine (NORVASC) 2.5 MG tablet    Sig: Take 1 tablet (2.5 mg total) by mouth  daily.    Dispense:  90 tablet    Refill:  3  . atorvastatin (LIPITOR) 40 MG tablet    Sig: Take 1 tablet (40 mg total) by mouth daily.    Dispense:  90 tablet    Refill:  3    Medications Discontinued During This Encounter  Medication Reason  . varenicline (CHANTIX STARTING MONTH PAK) 0.5 MG X 11 & 1 MG X 42 tablet Patient has not taken in last 30 days  . varenicline (CHANTIX CONTINUING MONTH PAK) 1 MG tablet Patient has not taken in last 30 days  . simvastatin (ZOCOR) 20 MG tablet     Follow-up: Return in about 2 months (around 01/28/2017), or [facility] 3 WEEKS, FASTING LABS/A1C PRIOR TO JUNE APT .   Sherlene Shams, MD

## 2016-11-28 NOTE — Progress Notes (Signed)
Pre visit review using our clinic review tool, if applicable. No additional management support is needed unless otherwise documented below in the visit note. 

## 2016-11-28 NOTE — Patient Instructions (Addendum)
I am adding amlodipine  To your blood pressure .  Continue losartan and metoprolol  SENDING YOU FOR A DOPPLER ULTRASOUND OF YOUR KIDNEYS TO MAKE SURE THERE IS NO BLOCKAGE OF BLOOD VESSELS (RENAL ARTERY STENOSIS )   PLEASE QUIT SMOKING !  ALSO SENDING YOU TO A PODIATRIST AT [facility]    I AM PRESCRIBING  GENERIC LIPITOR FOR YOUR  CHOLESTEROL,  YOU CAN DOUBLE THE SIMVASTATIN FOR NOW UNTIL GONE   WE NEED TO RECHECK LIVER ENZYMES AFTER 3 WEEKS OF THERAPY so come back for a lab appointment in 3 weeks    WE WILL REPEAT FASTING LABS IN June PRIOR TO YOUR NEXT APPOINTMENT

## 2016-11-29 NOTE — Assessment & Plan Note (Signed)
Spent 3 minutes discussing risk of continued tobacco abuse and concurrent diabetes , including but not limited to CAD, PAD, hypertension, and CA.  sHe is  interested in resuming pharmacotherapy at this time with Nicoderm patches

## 2016-11-29 NOTE — Assessment & Plan Note (Signed)
Adding amlodipine today.  Sending to vascular for evaluation of renal arteries,  (per patient she  may have history of RAS with stent placement done > 10 years ago at [facility] )

## 2016-11-29 NOTE — Assessment & Plan Note (Signed)
Changing statinfrom simvastatin to generic Lipitor given elevated risk , or goal LDL < 70.  Lab Results  Component Value Date   CHOL 236 (H) 10/13/2016   HDL 38.10 (L) 10/13/2016   LDLCALC 138 (H) 11/06/2015   LDLDIRECT 141.0 10/13/2016   TRIG 206.0 (H) 10/13/2016   CHOLHDL 6 10/13/2016

## 2016-11-29 NOTE — Assessment & Plan Note (Signed)
With permanent hearing loss and episodic vertigo

## 2016-11-29 NOTE — Assessment & Plan Note (Signed)
New diagnosis,  With escalating hypertension and history suggestive of RAS.  Referring to [facility] for renal artery doppler.  Smoking cessation advised.  Aspirin advised,  Statin changed to lipitor.   Lab Results  Component Value Date   HGBA1C 6.5 10/13/2016   Lab Results  Component Value Date   ALT 15 10/13/2016   AST 13 10/13/2016   ALKPHOS 106 10/13/2016   BILITOT 0.4 10/13/2016   Lab Results  Component Value Date   CREATININE 0.93 10/13/2016   Lab Results  Component Value Date   MICROALBUR 1.3 10/13/2016

## 2016-12-01 ENCOUNTER — Encounter (INDEPENDENT_AMBULATORY_CARE_PROVIDER_SITE_OTHER): Payer: Self-pay

## 2016-12-01 ENCOUNTER — Encounter (INDEPENDENT_AMBULATORY_CARE_PROVIDER_SITE_OTHER): Payer: Self-pay | Admitting: Vascular Surgery

## 2016-12-08 ENCOUNTER — Encounter (INDEPENDENT_AMBULATORY_CARE_PROVIDER_SITE_OTHER): Payer: Self-pay | Admitting: Vascular Surgery

## 2016-12-08 ENCOUNTER — Ambulatory Visit (INDEPENDENT_AMBULATORY_CARE_PROVIDER_SITE_OTHER): Payer: Medicare Other | Admitting: Vascular Surgery

## 2016-12-08 VITALS — BP 145/85 | HR 75 | Resp 16 | Ht 58.5 in | Wt 149.0 lb

## 2016-12-08 DIAGNOSIS — E1151 Type 2 diabetes mellitus with diabetic peripheral angiopathy without gangrene: Secondary | ICD-10-CM | POA: Diagnosis not present

## 2016-12-08 DIAGNOSIS — E782 Mixed hyperlipidemia: Secondary | ICD-10-CM

## 2016-12-08 DIAGNOSIS — J438 Other emphysema: Secondary | ICD-10-CM | POA: Diagnosis not present

## 2016-12-08 DIAGNOSIS — I150 Renovascular hypertension: Secondary | ICD-10-CM | POA: Diagnosis not present

## 2016-12-11 NOTE — Progress Notes (Signed)
MRN : [id number]  Patricia Fields is a 61 y.o. ([birth year]) female who presents with chief complaint of  Chief Complaint  Patient presents with  . New Evaluation    Renal stenosis  .  History of Present Illness: The patient is seen for evaluation of malignant hypertension which has been very difficult to control. The patient has a long history of hypertension which recently has become increasingly difficult to control utilizing medical therapy. The patient is consistently documented systolic blood pressures near 409 with diastolic pressures over 90.   The patient does have family history of hypertension.   There is no prior documented abdominal bruit. The patient occasionally has flushing symptoms but denies palpitations. No episodes of syncope.There is no history of headache. There is no history of flash pulmonary edema.  The patient denies a history of renal disease.  The patient denies amaurosis fugax or recent TIA symptoms. There are no recent neurological changes noted. The patient denies claudication symptoms or rest pain symptoms. The patient denies history of DVT, PE or superficial thrombophlebitis. The patient denies recent episodes of angina or shortness of breath.    No outpatient prescriptions have been marked as taking for the 12/08/16 encounter (Office Visit) with Renford Dills, MD.   Current Facility-Administered Medications for the 12/08/16 encounter (Office Visit) with Renford Dills, MD  Medication  . ipratropium-albuterol (DUONEB) 0.5-2.5 (3) MG/3ML nebulizer solution 3 mL    Past Medical History:  Diagnosis Date  . 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 Chronic kidney disease    renal stent  . COPD (chronic obstructive pulmonary disease) (HCC)   . History of nephrolithiasis   . HOH (hard of hearing)   . Hyperlipidemia   . Hypertension   . Interstitial cystitis   . Meniere disease   . Peripheral arterial disease [facility]) Feb  2011   by renal artery duplex  . Tobacco abuse    one pack daily    Past Surgical History:  Procedure Laterality Date  . ABDOMINAL HYSTERECTOMY    . arthroscopy     left knee , remote  . BREAST EXCISIONAL BIOPSY Left 1980's   NEG  . BREAST SURGERY     left breast cyst, benign  . CARPAL TUNNEL RELEASE     Dr. Hyacinth Meeker,  right hand   . CATARACT EXTRACTION W/PHACO Right 03/05/2015   Procedure: CATARACT EXTRACTION PHACO AND INTRAOCULAR LENS PLACEMENT (IOC);  Surgeon: Lia Hopping, MD;  Location: [facility] ORS;  Service: Ophthalmology;  Laterality: Right;  [place]: 00:52.3 AP%: 15.2 CDE: 7.96  . KNEE ARTHROSCOPY Left   . TOTAL ABDOMINAL HYSTERECTOMY W/ BILATERAL SALPINGOOPHORECTOMY  1992   due to uterine tumor, and endometriosis     Social History Social History  Substance Use Topics  . Smoking status: Current Every Day Smoker    Packs/day: 1.00    Types: Cigarettes  . Smokeless tobacco: Never Used  . Alcohol use No    Family History Family History  Problem Relation Age of Onset  . Coronary artery disease Mother   . COPD Mother   . Heart disease Mother   . Breast cancer Maternal Grandmother   No family history of bleeding/clotting disorders, porphyria or autoimmune disease   Allergies  Allergen Reactions  . Bee Venom Shortness Of Breath  . Tomato Hives     REVIEW OF SYSTEMS (Negative unless checked)  Constitutional: Weight loss  Fever  Chills Cardiac: Chest pain   Chest  pressure   Palpitations   Shortness of breath when laying flat   Shortness of breath with exertion. Vascular:  Pain in legs with walking   Pain in legs at rest  History of DVT   Phlebitis   Swelling in legs   Varicose veins   Non-healing ulcers Pulmonary:   Uses home oxygen   Productive cough   Hemoptysis   Wheeze  COPD   Asthma Neurologic:  Dizziness   Seizures   History of stroke   History of TIA  Aphasia   Vissual changes   Weakness or numbness in  arm   Weakness or numbness in leg Musculoskeletal:   Joint swelling   Joint pain   Low back pain Hematologic:  Easy bruising  Easy bleeding   Hypercoagulable state   Anemic Gastrointestinal:  Diarrhea   Vomiting  Gastroesophageal reflux/heartburn   Difficulty swallowing. Genitourinary:  Chronic kidney disease   Difficult urination  Frequent urination   Blood in urine Skin:  Rashes   Ulcers  Psychological:  History of anxiety    History of major depression.  Physical Examination  Vitals:   12/08/16 1050  BP: (!) 145/85  Pulse: 75  Resp: 16  Weight: 67.6 kg (149 lb)  Height: 4' 10.5" (1.486 m)   Body mass index is 30.61 kg/m. Gen: WD/WN, NAD Head: [place]/AT, No temporalis wasting.  Ear/Nose/Throat: Hearing grossly intact, nares w/o erythema or drainage, poor dentition Eyes: PER, EOMI, sclera nonicteric.  Neck: Supple, no masses.  No bruit or JVD.  Pulmonary:  Good air movement, clear to auscultation bilaterally, no use of accessory muscles.  Cardiac: RRR, normal S1, S2, no Murmurs. Vascular: Vessel Right Left  Radial Palpable Palpable  Ulnar Palpable Palpable  Brachial Palpable Palpable  Carotid Palpable Palpable  Femoral Palpable Palpable  Popliteal Palpable Palpable  PT Palpable Palpable  DP Palpable Palpable   Gastrointestinal: soft, non-distended. No guarding/no peritoneal signs.  Musculoskeletal: M/S 5/5 throughout.  No deformity or atrophy.  Neurologic: CN 2-12 intact. Pain and light touch intact in extremities.  Symmetrical.  Speech is fluent. Motor exam as listed above. Psychiatric: Judgment intact, Mood & affect appropriate for pt's clinical situation. Dermatologic: No rashes or ulcers noted.  No changes consistent with cellulitis. Lymph : No Cervical lymphadenopathy, no lichenification or skin changes of chronic lymphedema.  CBC Lab Results  Component Value Date   WBC 8.5 08/14/2014   HGB 14.6 08/14/2014   HCT 43.1  08/14/2014   MCV 93.8 08/14/2014   PLT 293.0 08/14/2014    BMET    Component Value Date/Time   NA 139 10/13/2016 0952   K 3.4 (L) 10/13/2016 0952   CL 108 10/13/2016 0952   CO2 30 10/13/2016 0952   GLUCOSE 97 10/13/2016 0952   BUN 14 10/13/2016 0952   CREATININE 0.93 10/13/2016 0952   CREATININE 0.95 01/25/2012 0922   CALCIUM 9.5 10/13/2016 0952   GFRNONAA 67 01/25/2012 0922   GFRAA 77 01/25/2012 0922   CrCl cannot be calculated (Patient's most recent lab result is older than the maximum 21 days allowed.).  COAG No results found for: INR, PROTIME  Radiology No results found.  Assessment/Plan 1. Renovascular hypertension Given patient's hypertension and multiple medications renal artery stenosis is a strong possibility.  Duplex ultrasound of the renal arteries will be ordered.  The patient will continue the current antihypertensive medications, no changes at this time.  The primary medical service will continue aggressive antihypertensive therapy as per the [facility] guidelines  2. Other emphysema (HCC) Continue pulmonary medications and aerosols as already ordered, these medications have been reviewed and there are no changes at this time.   3. DM (diabetes mellitus), type 2 with peripheral vascular complications (HCC) Continue hypoglycemic medications as already ordered, these medications have been reviewed and there are no changes at this time.  Hgb A1C to be monitored as already arranged by primary service   4. Mixed hyperlipidemia Continue statin as ordered and reviewed, no changes at this time     Levora Dredge, MD  12/11/2016 10:38 AM

## 2016-12-16 ENCOUNTER — Ambulatory Visit (INDEPENDENT_AMBULATORY_CARE_PROVIDER_SITE_OTHER): Payer: Medicare Other

## 2016-12-16 VITALS — BP 118/62 | HR 64 | Temp 98.3°F | Resp 14 | Ht 58.75 in | Wt 149.8 lb

## 2016-12-16 DIAGNOSIS — Z0000 Encounter for general adult medical examination without abnormal findings: Secondary | ICD-10-CM | POA: Diagnosis not present

## 2016-12-16 DIAGNOSIS — Z79899 Other long term (current) drug therapy: Secondary | ICD-10-CM | POA: Diagnosis not present

## 2016-12-16 LAB — COMPREHENSIVE METABOLIC PANEL
ALT: 15 U/L (ref 0–35)
AST: 13 U/L (ref 0–37)
Albumin: 4 g/dL (ref 3.5–5.2)
Alkaline Phosphatase: 120 U/L — ABNORMAL HIGH (ref 39–117)
BUN: 15 mg/dL (ref 6–23)
CO2: 26 meq/L (ref 19–32)
Calcium: 9.9 mg/dL (ref 8.4–10.5)
Chloride: 106 mEq/L (ref 96–112)
Creatinine, Ser: 0.9 mg/dL (ref 0.40–1.20)
GFR: 67.5 mL/min (ref >60.00)
GLUCOSE: 94 mg/dL (ref 70–99)
POTASSIUM: 3.3 meq/L — AB (ref 3.5–5.1)
Sodium: 140 mEq/L (ref 135–145)
Total Bilirubin: 0.5 mg/dL (ref 0.2–1.2)
Total Protein: 7.2 g/dL (ref 6.0–8.3)

## 2016-12-16 NOTE — Progress Notes (Signed)
Care was provided under my supervision. I agree with the management as indicated in the note.  Jaydalyn Demattia DO  

## 2016-12-16 NOTE — Patient Instructions (Addendum)
Patricia Fields , Thank you for taking time to come for your Medicare Wellness Visit. I appreciate your ongoing commitment to your health goals. Please review the following plan we discussed and let me know if I can assist you in the future.   Follow up with Dr. Darrick Huntsman as needed.    Bring a copy of your Health Care Power of Attorney and/or Living Will to be scanned into chart once completed.  Labs today as previously directed by PCP.  Have a great day!  These are the goals we discussed: Goals    . Increase physical activity    . Increase water intake          Stay hydrated and drink plenty of water/fruit or vegetable infused water Fill and finish cup of water when taking medications         This is a list of the screening recommended for you and due dates:  Health Maintenance  Topic Date Due  . HIV Screening  04/02/1971  . Pap Smear  12/19/2017*  . Flu Shot  03/22/2017  . Hemoglobin A1C  04/12/2017  . Eye exam for diabetics  04/22/2017  . Complete foot exam   11/28/2017  . Mammogram  09/30/2018  . Pneumococcal vaccine (2) 08/15/2019  . Tetanus Vaccine  07/24/2021  . Colon Cancer Screening  03/19/2022  .  Hepatitis C: One time screening is recommended by Center for Disease Control  (CDC) for  adults born from [birth year] through 1966.   Completed  *Topic was postponed. The date shown is not the original due date.    Steps to Quit Smoking Smoking tobacco can be harmful to your health and can affect almost every organ in your body. Smoking puts you, and those around you, at risk for developing many serious chronic diseases. Quitting smoking is difficult, but it is one of the best things that you can do for your health. It is never too late to quit. What are the benefits of quitting smoking? When you quit smoking, you lower your risk of developing serious diseases and conditions, such as:  Lung cancer or lung disease, such as COPD.  Heart disease.  Stroke.  Heart  attack.  Infertility.  Osteoporosis and bone fractures. Additionally, symptoms such as coughing, wheezing, and shortness of breath may get better when you quit. You may also find that you get sick less often because your body is stronger at fighting off colds and infections. If you are pregnant, quitting smoking can help to reduce your chances of having a baby of low birth weight. How do I get ready to quit? When you decide to quit smoking, create a plan to make sure that you are successful. Before you quit:  Pick a date to quit. Set a date within the next two weeks to give you time to prepare.  Write down the reasons why you are quitting. Keep this list in places where you will see it often, such as on your bathroom mirror or in your car or wallet.  Identify the people, places, things, and activities that make you want to smoke (triggers) and avoid them. Make sure to take these actions:  Throw away all cigarettes at home, at work, and in your car.  Throw away smoking accessories, such as Set designer.  Clean your car and make sure to empty the ashtray.  Clean your home, including curtains and carpets.  Tell your family, friends, and coworkers that you are quitting. Support from your  loved ones can make quitting easier.  Talk with your health care provider about your options for quitting smoking.  Find out what treatment options are covered by your health insurance. What strategies can I use to quit smoking? Talk with your healthcare provider about different strategies to quit smoking. Some strategies include:  Quitting smoking altogether instead of gradually lessening how much you smoke over a period of time. Research shows that quitting "cold [place]" is more successful than gradually quitting.  Attending in-person counseling to help you build problem-solving skills. You are more likely to have success in quitting if you attend several counseling sessions. Even short  sessions of 10 minutes can be effective.  Finding resources and support systems that can help you to quit smoking and remain smoke-free after you quit. These resources are most helpful when you use them often. They can include:  Online chats with a Veterinary surgeon.  Telephone quitlines.  Printed Materials engineer.  Support groups or group counseling.  Text messaging programs.  Mobile phone applications.  Taking medicines to help you quit smoking. (If you are pregnant or breastfeeding, talk with your health care provider first.) Some medicines contain nicotine and some do not. Both types of medicines help with cravings, but the medicines that include nicotine help to relieve withdrawal symptoms. Your health care provider may recommend:  Nicotine patches, gum, or lozenges.  Nicotine inhalers or sprays.  Non-nicotine medicine that is taken by mouth. Talk with your health care provider about combining strategies, such as taking medicines while you are also receiving in-person counseling. Using these two strategies together makes you more likely to succeed in quitting than if you used either strategy on its own. If you are pregnant or breastfeeding, talk with your health care provider about finding counseling or other support strategies to quit smoking. Do not take medicine to help you quit smoking unless told to do so by your health care provider. What things can I do to make it easier to quit? Quitting smoking might feel overwhelming at first, but there is a lot that you can do to make it easier. Take these important actions:  Reach out to your family and friends and ask that they support and encourage you during this time. Call telephone quitlines, reach out to support groups, or work with a counselor for support.  Ask people who smoke to avoid smoking around you.  Avoid places that trigger you to smoke, such as bars, parties, or smoke-break areas at work.  Spend time around people who do  not smoke.  Lessen stress in your life, because stress can be a smoking trigger for some people. To lessen stress, try:  Exercising regularly.  Deep-breathing exercises.  Yoga.  Meditating.  Performing a body scan. This involves closing your eyes, scanning your body from head to toe, and noticing which parts of your body are particularly tense. Purposefully relax the muscles in those areas.  Download or purchase mobile phone or tablet apps (applications) that can help you stick to your quit plan by providing reminders, tips, and encouragement. There are many free apps, such as QuitGuide from the [employer] developer for Disease Control and Prevention). You can find other support for quitting smoking (smoking cessation) through smokefree.gov and other websites. How will I feel when I quit smoking? Within the first 24 hours of quitting smoking, you may start to feel some withdrawal symptoms. These symptoms are usually most noticeable 2-3 days after quitting, but they usually do not last beyond 2-3  weeks. Changes or symptoms that you might experience include:  Mood swings.  Restlessness, anxiety, or irritation.  Difficulty concentrating.  Dizziness.  Strong cravings for sugary foods in addition to nicotine.  Mild weight gain.  Constipation.  Nausea.  Coughing or a sore throat.  Changes in how your medicines work in your body.  A depressed mood.  Difficulty sleeping (insomnia). After the first 2-3 weeks of quitting, you may start to notice more positive results, such as:  Improved sense of smell and taste.  Decreased coughing and sore throat.  Slower heart rate.  Lower blood pressure.  Clearer skin.  The ability to breathe more easily.  Fewer sick days. Quitting smoking is very challenging for most people. Do not get discouraged if you are not successful the first time. Some people need to make many attempts to quit before they achieve long-term success. Do your best to  stick to your quit plan, and talk with your health care provider if you have any questions or concerns. This information is not intended to replace advice given to you by your health care provider. Make sure you discuss any questions you have with your health care provider. Document Released: 08/02/2001 Document Revised: 04/05/2016 Document Reviewed: 12/23/2014 Elsevier Interactive Patient Education  2017 [employer].

## 2016-12-16 NOTE — Progress Notes (Signed)
Subjective:   CHRISTYANA CORWIN is a 61 y.o. female who presents for an Initial Medicare Annual Wellness Visit.  Review of Systems    No ROS.  Medicare Wellness Visit.  Cardiac Risk Factors include: advanced age (>19men, >15 women);diabetes mellitus;hypertension;obesity (BMI >30kg/m2)     Objective:    Today's Vitals   12/16/16 1011  BP: 118/62  Pulse: 64  Resp: 14  Temp: 98.3 F (36.8 C)  TempSrc: Oral  SpO2: 98%  Weight: 149 lb 12.8 oz (67.9 kg)  Height: 4' 10.75" (1.492 m)   Body mass index is 30.51 kg/m.   Current Medications (verified) Outpatient Encounter Prescriptions as of 12/16/2016  Medication Sig  . albuterol (PROVENTIL) (2.5 MG/3ML) 0.083% nebulizer solution Take 3 mLs (2.5 mg total) by nebulization every 8 (eight) hours as needed for wheezing or shortness of breath.  . Albuterol Sulfate 108 (90 Base) MCG/ACT AEPB Inhale 1 puff into the lungs every 8 (eight) hours as needed. For wheezing  . amitriptyline (ELAVIL) 25 MG tablet Take 1 tablet (25 mg total) by mouth at bedtime.  [name] amLODipine (NORVASC) 2.5 MG tablet Take 1 tablet (2.5 mg total) by mouth daily.  [name] atorvastatin (LIPITOR) 40 MG tablet Take 1 tablet (40 mg total) by mouth daily.  [name] buPROPion (WELLBUTRIN SR) 150 MG 12 hr tablet Take 1 tablet (150 mg total) by mouth 2 (two) times daily.  . Cholecalciferol (VITAMIN D3) 1000 UNITS CAPS Take by mouth.    . ergocalciferol (DRISDOL) 50000 UNITS capsule Take 1 capsule (50,000 Units total) by mouth once a week.  . losartan-hydrochlorothiazide (HYZAAR) 100-25 MG tablet Take 1 tablet by mouth daily.  . metoprolol succinate (TOPROL-XL) 50 MG 24 hr tablet Take 1 tablet (50 mg total) by mouth daily.  . nicotine (NICODERM CQ - DOSED IN MG/24 HOURS) 21 mg/24hr patch Place 1 patch (21 mg total) onto the skin daily.  . ondansetron (ZOFRAN) 8 MG tablet Take 1 tablet (8 mg total) by mouth every 8 (eight) hours as needed for nausea or vomiting.  [name] PARoxetine (PAXIL) 10 MG  tablet Take 1 tablet (10 mg total) by mouth every morning.  . pentosan polysulfate (ELMIRON) 100 MG capsule Take 1 capsule (100 mg total) by mouth 3 (three) times daily.  . traMADol (ULTRAM) 50 MG tablet TAKE ONE TABLET BY MOUTH EVERY 6 HOURS AS NEEDED  . vitamin E 100 UNIT capsule Take 100 Units by mouth daily.   Facility-Administered Encounter Medications as of 12/16/2016  Medication  . ipratropium-albuterol (DUONEB) 0.5-2.5 (3) MG/3ML nebulizer solution 3 mL    Allergies (verified) Bee venom and Tomato   History: Past Medical History:  Diagnosis Date  . 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 Chronic kidney disease    renal stent  . COPD (chronic obstructive pulmonary disease) (HCC)   . History of nephrolithiasis   . HOH (hard of hearing)   . Hyperlipidemia   . Hypertension   . Interstitial cystitis   . Meniere disease   . Peripheral arterial disease [facility]) Feb 2011   by renal artery duplex  . Tobacco abuse    one pack daily   Past Surgical History:  Procedure Laterality Date  . ABDOMINAL HYSTERECTOMY    . arthroscopy     left knee , remote  . BREAST EXCISIONAL BIOPSY Left 1980's   NEG  . BREAST SURGERY     left breast cyst, benign  . CARPAL TUNNEL RELEASE  Dr. Hyacinth Meeker,  right hand   . CATARACT EXTRACTION W/PHACO Right 03/05/2015   Procedure: CATARACT EXTRACTION PHACO AND INTRAOCULAR LENS PLACEMENT (IOC);  Surgeon: Lia Hopping, MD;  Location: [facility] ORS;  Service: Ophthalmology;  Laterality: Right;  [place]: 00:52.3 AP%: 15.2 CDE: 7.96  . KNEE ARTHROSCOPY Left   . TOTAL ABDOMINAL HYSTERECTOMY W/ BILATERAL SALPINGOOPHORECTOMY  1992   due to uterine tumor, and endometriosis    Family History  Problem Relation Age of Onset  . Coronary artery disease Mother   . COPD Mother   . Heart disease Mother   . Breast cancer Maternal Grandmother    Social History   Occupational History  . Not on file.   Social History Main Topics  . Smoking  status: Current Every Day Smoker    Packs/day: 1.00    Types: Cigarettes  . Smokeless tobacco: Never Used  . Alcohol use No  . Drug use: No  . Sexual activity: Not Currently    Tobacco Counseling Ready to quit: Not Answered Counseling given: Not Answered   Activities of Daily Living In your present state of health, do you have any difficulty performing the following activities: 12/16/2016  Hearing? Y  Vision? N  Difficulty concentrating or making decisions? Y  Walking or climbing stairs? Y  Dressing or bathing? N  Doing errands, shopping? Y  Preparing Food and eating ? N  Using the Toilet? N  In the past six months, have you accidently leaked urine? Y  Do you have problems with loss of bowel control? N  Managing your Medications? N  Managing your Finances? N  Housekeeping or managing your Housekeeping? N  Some recent data might be hidden    Immunizations and Health Maintenance Immunization History  Administered Date(s) Administered  . Influenza Split 07/25/2011, 09/27/2012  . Influenza,inj,Quad PF,36+ Mos 05/29/2013, 08/14/2014, 11/06/2015, 09/19/2016  . Pneumococcal Conjugate-13 05/29/2013  . Pneumococcal Polysaccharide-23 08/14/2014  . Tdap 07/25/2011  . Zoster 08/20/2014   Health Maintenance Due  Topic Date Due  . HIV Screening  04/02/1971    Patient Care Team: Sherlene Shams, MD as PCP - General (Internal Medicine)  Indicate any recent Medical Services you may have received from other than [facility] providers in the past year (date may be approximate).     Assessment:   This is a routine wellness examination for Josee. The goal of the wellness visit is to assist the patient how to close the gaps in care and create a preventative care plan for the patient.   Taking calcium VIT D as appropriate/Osteoporosis risk reviewed.  Medications reviewed; taking without issues or barriers.  Safety issues reviewed; smoke detectors in the home. No firearms in the home.   Wears seatbelts when riding with others. Patient does wear sunscreen or protective clothing when in direct sunlight. No violence in the home.  Patient is alert, normal appearance, oriented to person/place/and time. Correctly identified the president of the [place], recall of 3/3 words, and performing simple calculations.  Patient displays appropriate judgement and can read correct time from watch face.  No new identified risk were noted.  No failures at ADL's or IADL's. Cane in use when ambulating.  BMI- discussed the importance of a healthy diet, water intake and exercise. Educational material provided.   HTN- followed by PCP.  Eye- Visual acuity not assessed per patient preference since they have regular follow up with the ophthalmologist.  Wears corrective lenses when reading.  Sleep patterns- Sleeps 8 hours at night.  Wakes feeling rested. Naps during the day.  HIV screening discussed educational material provided.  Patient Concerns: None at this time. Follow up with PCP as needed.  Hearing/Vision screen Hearing Screening Comments: Followed by [facility] (Dr. [name]) Dx of hearing loss, mixed conductive and sensorineural Does not wear hearing aids Vision Screening Comments: Followed by [facility] Wears corrective lenses Last OV 2017 Cataract extraction, R eye only No retinopathy reported Visual acuity not assessed per patient preference since they have regular follow up with the ophthalmologist  Dietary issues and exercise activities discussed: Current Exercise Habits: The patient does not participate in regular exercise at present  Goals    . Increase physical activity    . Increase water intake          Stay hydrated and drink plenty of water/fruit or vegetable infused water Fill and finish cup of water when taking medications        Depression Screen PHQ 2/9 Scores 12/16/2016 09/19/2016 07/19/2016 09/27/2012  PHQ - 2 Score 0 0 0 0  PHQ- 9 Score 0 - - -      Fall Risk Fall Risk  12/16/2016 09/19/2016 07/19/2016  Falls in the past year? Yes No Yes  Number falls in past yr: 1 - 2 or more  Injury with Fall? No - No  Risk Factor Category  - - High Fall Risk  Follow up Falls prevention discussed;Education provided - -    Cognitive Function: MMSE - Mini Mental State Exam 12/16/2016  Orientation to time 5  Orientation to Place 5  Registration 3  Attention/ Calculation 5  Recall 3  Language- name 2 objects 2  Language- repeat 1  Language- follow 3 step command 3  Language- read & follow direction 1  Write a sentence 1  Copy design 1  Total score 30        Screening Tests Health Maintenance  Topic Date Due  . HIV Screening  04/02/1971  . PAP SMEAR  12/19/2017 (Originally 09/27/2001)  . INFLUENZA VACCINE  03/22/2017  . HEMOGLOBIN A1C  04/12/2017  . OPHTHALMOLOGY EXAM  04/22/2017  . FOOT EXAM  11/28/2017  . MAMMOGRAM  09/30/2018  . PNEUMOCOCCAL POLYSACCHARIDE VACCINE (2) 08/15/2019  . TETANUS/TDAP  07/24/2021  . COLONOSCOPY  03/19/2022  . Hepatitis C Screening  Completed      Plan:    End of life planning; Advanced aging; Advanced directives discussed.  No HCPOA/Living Will.  Additional information provided to help them start the conversation with family.  Copy of HCPOA/Living Will requested upon completion. Time spent on this topic is 30 minutes.  I have personally reviewed and noted the following in the patient's chart:   . Medical and social history . Use of alcohol, tobacco or illicit drugs  . Current medications and supplements . Functional ability and status . Nutritional status . Physical activity . Advanced directives . List of other physicians . Hospitalizations, surgeries, and ER visits in previous 12 months . Vitals . Screenings to include cognitive, depression, and falls . Referrals and appointments  In addition, I have reviewed and discussed with patient certain preventive protocols, quality metrics, and best  practice recommendations. A written personalized care plan for preventive services as well as general preventive health recommendations were provided to patient.     Ashok Pall, LPN   1/61/0960

## 2016-12-18 ENCOUNTER — Other Ambulatory Visit: Payer: Self-pay | Admitting: Internal Medicine

## 2016-12-18 MED ORDER — POTASSIUM CHLORIDE CRYS ER 20 MEQ PO TBCR: 20.0000 meq | EXTENDED_RELEASE_TABLET | Freq: Every day | ORAL | 3 refills | Status: DC

## 2016-12-22 ENCOUNTER — Ambulatory Visit: Payer: Self-pay | Admitting: Podiatry

## 2017-01-12 DIAGNOSIS — I701 Atherosclerosis of renal artery: Secondary | ICD-10-CM | POA: Diagnosis not present

## 2017-01-12 DIAGNOSIS — N200 Calculus of kidney: Secondary | ICD-10-CM | POA: Diagnosis not present

## 2017-01-12 DIAGNOSIS — H8103 Meniere's disease, bilateral: Secondary | ICD-10-CM | POA: Diagnosis not present

## 2017-01-12 DIAGNOSIS — N3010 Interstitial cystitis (chronic) without hematuria: Secondary | ICD-10-CM | POA: Diagnosis not present

## 2017-01-17 ENCOUNTER — Encounter: Payer: Self-pay | Admitting: Podiatry

## 2017-01-17 ENCOUNTER — Ambulatory Visit (INDEPENDENT_AMBULATORY_CARE_PROVIDER_SITE_OTHER): Payer: Medicare Other

## 2017-01-17 ENCOUNTER — Ambulatory Visit (INDEPENDENT_AMBULATORY_CARE_PROVIDER_SITE_OTHER): Payer: Medicare Other | Admitting: Podiatry

## 2017-01-17 DIAGNOSIS — E0842 Diabetes mellitus due to underlying condition with diabetic polyneuropathy: Secondary | ICD-10-CM | POA: Diagnosis not present

## 2017-01-17 DIAGNOSIS — M7752 Other enthesopathy of left foot: Secondary | ICD-10-CM

## 2017-01-17 DIAGNOSIS — M79672 Pain in left foot: Secondary | ICD-10-CM | POA: Diagnosis not present

## 2017-01-17 DIAGNOSIS — B351 Tinea unguium: Secondary | ICD-10-CM | POA: Diagnosis not present

## 2017-01-17 DIAGNOSIS — M79676 Pain in unspecified toe(s): Secondary | ICD-10-CM

## 2017-01-17 NOTE — Progress Notes (Signed)
   SUBJECTIVE Patient with a history of diabetes mellitus presents to office today complaining of elongated, thickened nails. Pain while ambulating in shoes. Patient is unable to trim their own nails. She also reports intermittent pain in the left fifth toe due to a small nodule on the medial side of the digit that has been present for the past two months. She states the pain radiates to the dorsum of the foot.   OBJECTIVE General Patient is awake, alert, and oriented x 3 and in no acute distress. Derm Skin is dry and supple bilateral. Negative open lesions or macerations. Remaining integument unremarkable. Nails are tender, long, thickened and dystrophic with subungual debris, consistent with onychomycosis, 1-5 bilateral. No signs of infection noted. Vasc  DP and PT pedal pulses palpable bilaterally. Temperature gradient within normal limits.  Neuro Epicritic and protective threshold sensation diminished bilaterally.  Musculoskeletal Exam No symptomatic pedal deformities noted bilateral. Muscular strength within normal limits.  ASSESSMENT 1. Diabetes Mellitus w/ peripheral neuropathy 2. Onychomycosis of nail due to dermatophyte bilateral 3. Pain in foot bilateral 4. Capsulitis left fifth toe  PLAN OF CARE 1. Patient evaluated today. X-Rays reviewed. 2. Instructed to maintain good pedal hygiene and foot care. Stressed importance of controlling blood sugar.  3. Mechanical debridement of nails 1-5 bilaterally performed using a nail nipper. Filed with dremel without incident.  4. Injection of 0.5 mLs Celestone Soluspan injected into the left fifth digit. 5. Return to clinic when necessary.     Felecia ShellingBrent M. Evans, DPM [facility]  Dr. Felecia ShellingBrent M. Evans, DPM    [address]                                        [place], [place] [address]                Office [phone]  Fax [phone]

## 2017-01-20 DIAGNOSIS — I10 Essential (primary) hypertension: Secondary | ICD-10-CM | POA: Diagnosis not present

## 2017-01-20 DIAGNOSIS — E785 Hyperlipidemia, unspecified: Secondary | ICD-10-CM | POA: Diagnosis not present

## 2017-01-20 DIAGNOSIS — I701 Atherosclerosis of renal artery: Secondary | ICD-10-CM | POA: Diagnosis not present

## 2017-01-24 MED ORDER — BETAMETHASONE SOD PHOS & ACET 6 (3-3) MG/ML IJ SUSP: 3.0000 mg | Freq: Once | INTRAMUSCULAR | Status: DC

## 2017-01-26 ENCOUNTER — Encounter (INDEPENDENT_AMBULATORY_CARE_PROVIDER_SITE_OTHER): Payer: Medicare Other

## 2017-01-26 ENCOUNTER — Ambulatory Visit (INDEPENDENT_AMBULATORY_CARE_PROVIDER_SITE_OTHER): Payer: Medicare Other | Admitting: Vascular Surgery

## 2017-02-21 DIAGNOSIS — Z79899 Other long term (current) drug therapy: Secondary | ICD-10-CM | POA: Diagnosis not present

## 2017-02-21 DIAGNOSIS — I10 Essential (primary) hypertension: Secondary | ICD-10-CM | POA: Diagnosis not present

## 2017-02-21 DIAGNOSIS — I701 Atherosclerosis of renal artery: Secondary | ICD-10-CM | POA: Diagnosis not present

## 2017-02-21 DIAGNOSIS — Z720 Tobacco use: Secondary | ICD-10-CM | POA: Diagnosis not present

## 2017-03-17 ENCOUNTER — Other Ambulatory Visit: Payer: Self-pay | Admitting: Internal Medicine

## 2017-03-17 DIAGNOSIS — I10 Essential (primary) hypertension: Secondary | ICD-10-CM

## 2017-03-20 DIAGNOSIS — I10 Essential (primary) hypertension: Secondary | ICD-10-CM | POA: Diagnosis not present

## 2017-03-20 DIAGNOSIS — I701 Atherosclerosis of renal artery: Secondary | ICD-10-CM | POA: Diagnosis not present

## 2017-03-20 DIAGNOSIS — M5430 Sciatica, unspecified side: Secondary | ICD-10-CM | POA: Diagnosis not present

## 2017-03-20 DIAGNOSIS — Z87442 Personal history of urinary calculi: Secondary | ICD-10-CM | POA: Diagnosis not present

## 2017-03-20 DIAGNOSIS — Z79899 Other long term (current) drug therapy: Secondary | ICD-10-CM | POA: Diagnosis not present

## 2017-05-11 ENCOUNTER — Ambulatory Visit: Payer: Medicare Other | Admitting: Internal Medicine

## 2017-05-17 NOTE — Telephone Encounter (Signed)
Error

## 2017-05-23 DIAGNOSIS — E119 Type 2 diabetes mellitus without complications: Secondary | ICD-10-CM | POA: Diagnosis not present

## 2017-05-23 DIAGNOSIS — Z95828 Presence of other vascular implants and grafts: Secondary | ICD-10-CM | POA: Diagnosis not present

## 2017-05-23 DIAGNOSIS — I701 Atherosclerosis of renal artery: Secondary | ICD-10-CM | POA: Diagnosis not present

## 2017-05-23 DIAGNOSIS — R6889 Other general symptoms and signs: Secondary | ICD-10-CM | POA: Diagnosis not present

## 2017-05-23 DIAGNOSIS — I10 Essential (primary) hypertension: Secondary | ICD-10-CM | POA: Diagnosis not present

## 2017-05-23 DIAGNOSIS — Z09 Encounter for follow-up examination after completed treatment for conditions other than malignant neoplasm: Secondary | ICD-10-CM | POA: Diagnosis not present

## 2017-05-23 DIAGNOSIS — E785 Hyperlipidemia, unspecified: Secondary | ICD-10-CM | POA: Diagnosis not present

## 2017-05-29 ENCOUNTER — Ambulatory Visit: Payer: Medicare Other | Admitting: Internal Medicine

## 2017-06-09 DIAGNOSIS — H8109 Meniere's disease, unspecified ear: Secondary | ICD-10-CM | POA: Diagnosis not present

## 2017-06-09 DIAGNOSIS — Z7982 Long term (current) use of aspirin: Secondary | ICD-10-CM | POA: Diagnosis not present

## 2017-06-09 DIAGNOSIS — Z87442 Personal history of urinary calculi: Secondary | ICD-10-CM | POA: Diagnosis not present

## 2017-06-09 DIAGNOSIS — I10 Essential (primary) hypertension: Secondary | ICD-10-CM | POA: Diagnosis not present

## 2017-06-09 DIAGNOSIS — I701 Atherosclerosis of renal artery: Secondary | ICD-10-CM | POA: Diagnosis not present

## 2017-06-09 DIAGNOSIS — N3010 Interstitial cystitis (chronic) without hematuria: Secondary | ICD-10-CM | POA: Diagnosis not present

## 2017-06-09 DIAGNOSIS — Z79891 Long term (current) use of opiate analgesic: Secondary | ICD-10-CM | POA: Diagnosis not present

## 2017-06-09 DIAGNOSIS — Z8249 Family history of ischemic heart disease and other diseases of the circulatory system: Secondary | ICD-10-CM | POA: Diagnosis not present

## 2017-06-09 DIAGNOSIS — Z955 Presence of coronary angioplasty implant and graft: Secondary | ICD-10-CM | POA: Diagnosis not present

## 2017-06-09 DIAGNOSIS — K089 Disorder of teeth and supporting structures, unspecified: Secondary | ICD-10-CM | POA: Diagnosis not present

## 2017-06-09 DIAGNOSIS — Z79899 Other long term (current) drug therapy: Secondary | ICD-10-CM | POA: Diagnosis not present

## 2017-06-09 DIAGNOSIS — E119 Type 2 diabetes mellitus without complications: Secondary | ICD-10-CM | POA: Diagnosis not present

## 2017-06-09 DIAGNOSIS — N261 Atrophy of kidney (terminal): Secondary | ICD-10-CM | POA: Diagnosis not present

## 2017-06-27 ENCOUNTER — Telehealth: Payer: Self-pay | Admitting: *Deleted

## 2017-06-27 NOTE — Telephone Encounter (Signed)
Copied from CRM [id number]. Topic: Quick Communication - Appointment Cancellation >> Jun 27, 2017 12:25 PM Patricia Fields, Rosey Batheresa D wrote: Patient called to cancel appointment scheduled for 06/28/17 Patient will call back to rescheduled their appointment.  Route to department's PEC pool.

## 2017-06-28 ENCOUNTER — Ambulatory Visit: Payer: Medicare Other | Admitting: Internal Medicine

## 2017-07-21 ENCOUNTER — Other Ambulatory Visit: Payer: Self-pay | Admitting: Internal Medicine

## 2017-09-01 ENCOUNTER — Other Ambulatory Visit: Payer: Self-pay | Admitting: Internal Medicine

## 2017-10-11 ENCOUNTER — Other Ambulatory Visit: Payer: Self-pay | Admitting: Internal Medicine

## 2017-10-11 DIAGNOSIS — I10 Essential (primary) hypertension: Secondary | ICD-10-CM

## 2017-11-02 ENCOUNTER — Telehealth: Payer: Self-pay

## 2017-11-02 NOTE — Telephone Encounter (Signed)
Reason for call:bronchitis Symptoms: cough, chest congestion  , fever yesterday, didn't not take temperature in bed all day yesterday, chills,  Duration 2 days   Medications: no medications OTC , used nebulizer  Last seen for this problem: Seen by: History of COPD  Patient wants Z-pack  Recommend patient go to urgent care for evaluation  No appointments , advised patient that you were only here in office this am , patient insisted I send message to you,  Has appointment with Claris CheMargaret tomorrow

## 2017-11-03 ENCOUNTER — Encounter: Payer: Self-pay | Admitting: Family

## 2017-11-03 ENCOUNTER — Ambulatory Visit (INDEPENDENT_AMBULATORY_CARE_PROVIDER_SITE_OTHER): Payer: Medicare Other | Admitting: Family

## 2017-11-03 VITALS — BP 138/72 | HR 85 | Temp 98.1°F | Wt 171.0 lb

## 2017-11-03 DIAGNOSIS — J40 Bronchitis, not specified as acute or chronic: Secondary | ICD-10-CM

## 2017-11-03 MED ORDER — AZITHROMYCIN 250 MG PO TABS: ORAL_TABLET | ORAL | 0 refills | Status: DC

## 2017-11-03 NOTE — Progress Notes (Signed)
Subjective:    Patient ID: Patricia Fields, female    DOB: [birth year], 62 y.o.   MRN: [id number]  CC: Patricia Fields is a 62 y.o. female who presents today for an acute visit.    HPI: CC: productive cough one week ago, worsening.   No inhalers at home due to insurance. Using neb at home with relief. No neb today.   No cp, myaglias. SOB at baseline. Wheezing, nasal congestion. Endorses tactile warmth a week ago, resolved.   H/o copd No recent hospitalization.   [facility]       HISTORY:  Past Medical History:  Diagnosis Date  . 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 Chronic kidney disease    renal stent  . COPD (chronic obstructive pulmonary disease) (HCC)   . History of nephrolithiasis   . HOH (hard of hearing)   . Hyperlipidemia   . Hypertension   . Interstitial cystitis   . Meniere disease   . Peripheral arterial disease [facility]) Feb 2011   by renal artery duplex  . Tobacco abuse    one pack daily   Past Surgical History:  Procedure Laterality Date  . ABDOMINAL HYSTERECTOMY    . arthroscopy     left knee , remote  . BREAST EXCISIONAL BIOPSY Left 1980's   NEG  . BREAST SURGERY     left breast cyst, benign  . CARPAL TUNNEL RELEASE     Dr. Hyacinth Meeker,  right hand   . CATARACT EXTRACTION W/PHACO Right 03/05/2015   Procedure: CATARACT EXTRACTION PHACO AND INTRAOCULAR LENS PLACEMENT (IOC);  Surgeon: Lia Hopping, MD;  Location: [facility] ORS;  Service: Ophthalmology;  Laterality: Right;  [place]: 00:52.3 AP%: 15.2 CDE: 7.96  . KNEE ARTHROSCOPY Left   . TOTAL ABDOMINAL HYSTERECTOMY W/ BILATERAL SALPINGOOPHORECTOMY  1992   due to uterine tumor, and endometriosis    Family History  Problem Relation Age of Onset  . Coronary artery disease Mother   . COPD Mother   . Heart disease Mother   . Breast cancer Maternal Grandmother     Allergies: Bee venom and Tomato Current Outpatient Medications on File Prior to Visit  Medication Sig Dispense Refill    . albuterol (PROVENTIL) (2.5 MG/3ML) 0.083% nebulizer solution Take 3 mLs (2.5 mg total) by nebulization every 8 (eight) hours as needed for wheezing or shortness of breath. 150 mL 1  . Albuterol Sulfate 108 (90 Base) MCG/ACT AEPB Inhale 1 puff into the lungs every 8 (eight) hours as needed. For wheezing 3 each 0  . amitriptyline (ELAVIL) 25 MG tablet Take 1 tablet (25 mg total) by mouth at bedtime. 30 tablet 1  . amLODipine (NORVASC) 2.5 MG tablet Take 1 tablet (2.5 mg total) by mouth daily. 90 tablet 3  . atorvastatin (LIPITOR) 40 MG tablet TAKE 1 TABLET BY MOUTH  DAILY 90 tablet 0  . buPROPion (WELLBUTRIN SR) 150 MG 12 hr tablet TAKE 1 TABLET BY MOUTH TWO  TIMES DAILY 180 tablet 1  . Cholecalciferol (VITAMIN D3) 1000 UNITS CAPS Take by mouth.      . ergocalciferol (DRISDOL) 50000 UNITS capsule Take 1 capsule (50,000 Units total) by mouth once a week. 12 capsule 0  . losartan-hydrochlorothiazide (HYZAAR) 100-25 MG tablet TAKE 1 TABLET BY MOUTH  DAILY 90 tablet 1  . metoprolol succinate (TOPROL-XL) 50 MG 24 hr tablet TAKE 1 TABLET BY MOUTH  DAILY 90 tablet 0  . nicotine (NICODERM CQ - DOSED IN  MG/24 HOURS) 21 mg/24hr patch Place 1 patch (21 mg total) onto the skin daily. 28 patch 3  . PARoxetine (PAXIL) 10 MG tablet TAKE 1 TABLET BY MOUTH  EVERY MORNING 90 tablet 0  . pentosan polysulfate (ELMIRON) 100 MG capsule Take 1 capsule (100 mg total) by mouth 3 (three) times daily. 90 capsule 1  . potassium chloride SA (K-DUR,KLOR-CON) 20 MEQ tablet TAKE 1 TABLET BY MOUTH  DAILY 90 tablet 0  . vitamin E 100 UNIT capsule Take 100 Units by mouth daily.     Current Facility-Administered Medications on File Prior to Visit  Medication Dose Route Frequency Provider Last Rate Last Dose  . betamethasone acetate-betamethasone sodium phosphate (CELESTONE) injection 3 mg  3 mg Intramuscular Once Evans, Brent M, DPM      . ipratropium-albuterol (DUONEB) 0.5-2.5 (3) MG/3ML nebulizer solution 3 mL  3 mL Nebulization  Q6H Allegra Grana, FNP        Social History   Tobacco Use  . Smoking status: Current Every Day Smoker    Packs/day: 1.00    Types: Cigarettes  . Smokeless tobacco: Never Used  Substance Use Topics  . Alcohol use: No    Alcohol/week: 0.0 oz  . Drug use: No    Review of Systems  Constitutional: Negative for chills and fever.  HENT: Positive for congestion. Negative for sinus pressure and sore throat.   Respiratory: Positive for cough and wheezing.   Cardiovascular: Negative for chest pain and palpitations.  Gastrointestinal: Negative for nausea and vomiting.      Objective:    BP 138/72 (BP Location: Left Arm, Patient Position: Sitting, Cuff Size: Normal)   Pulse 85   Temp 98.1 F (36.7 C) (Oral)   Wt 171 lb (77.6 kg)   SpO2 91%   BMI 34.83 kg/m    Physical Exam  Constitutional: She appears well-developed and well-nourished.  HENT:  Head: Normocephalic and atraumatic.  Right Ear: Hearing, tympanic membrane, external ear and ear canal normal. No drainage, swelling or tenderness. No foreign bodies. Tympanic membrane is not erythematous and not bulging. No middle ear effusion. No decreased hearing is noted.  Left Ear: Hearing, tympanic membrane, external ear and ear canal normal. No drainage, swelling or tenderness. No foreign bodies. Tympanic membrane is not erythematous and not bulging.  No middle ear effusion. No decreased hearing is noted.  Nose: Nose normal. No rhinorrhea. Right sinus exhibits no maxillary sinus tenderness and no frontal sinus tenderness. Left sinus exhibits no maxillary sinus tenderness and no frontal sinus tenderness.  Mouth/Throat: Uvula is midline, oropharynx is clear and moist and mucous membranes are normal. No oropharyngeal exudate, posterior oropharyngeal edema, posterior oropharyngeal erythema or tonsillar abscesses.  Eyes: Conjunctivae are normal.  Cardiovascular: Regular rhythm, normal heart sounds and normal pulses.  Pulmonary/Chest:  Effort normal and breath sounds normal. She has no wheezes. She has no rhonchi. She has no rales.  Lymphadenopathy:       Head (right side): No submental, no submandibular, no tonsillar, no preauricular, no posterior auricular and no occipital adenopathy present.       Head (left side): No submental, no submandibular, no tonsillar, no preauricular, no posterior auricular and no occipital adenopathy present.    She has no cervical adenopathy.  Neurological: She is alert.  Skin: Skin is warm and dry.  Psychiatric: She has a normal mood and affect. Her speech is normal and behavior is normal. Thought content normal.  Vitals reviewed.      Assessment &  Plan:   Problem List Items Addressed This Visit      Respiratory   Bronchitis - Primary    Afebrile.  Patient is well-appearing.  She is not in any acute respiratory distress.  We will go ahead and start azithromycin due to h.o COPD and duration of symptoms.  We chose not to start prednisone today in the absence of wheezing on exam and also patient is a history of hypertension, status post renal stent.  Encouraged her to use nebulizer at home.  She will let me know if she has not better.      Relevant Medications   azithromycin (ZITHROMAX) 250 MG tablet   Other Relevant Orders   DME Other see comment        I am having Patricia Kinnieriane R. Notaro start on azithromycin. I am also having her maintain her Vitamin D3, vitamin E, ergocalciferol, pentosan polysulfate, amitriptyline, Albuterol Sulfate, albuterol, nicotine, amLODipine, buPROPion, losartan-hydrochlorothiazide, metoprolol succinate, PARoxetine, potassium chloride SA, and atorvastatin. We will continue to administer ipratropium-albuterol and betamethasone acetate-betamethasone sodium phosphate.   Meds ordered this encounter  Medications  . azithromycin (ZITHROMAX) 250 MG tablet    Sig: Tale 500 mg PO on day 1, then 250 mg PO q24h x 4 days.    Dispense:  6 tablet    Refill:  0    Order  Specific Question:   Supervising Provider    Answer:   Sherlene ShamsULLO, TERESA L [[id number]]    Return precautions given.   Risks, benefits, and alternatives of the medications and treatment plan prescribed today were discussed, and patient expressed understanding.   Education regarding symptom management and diagnosis given to patient on AVS.  Continue to follow with Sherlene Shamsullo, Teresa L, MD for routine health maintenance.   Patricia Kinnieriane R Neuenfeldt and I agreed with plan.   Rennie PlowmanMargaret Arnett, FNP

## 2017-11-03 NOTE — Assessment & Plan Note (Signed)
Afebrile.  Patient is well-appearing.  She is not in any acute respiratory distress.  We will go ahead and start azithromycin due to h.o COPD and duration of symptoms.  We chose not to start prednisone today in the absence of wheezing on exam and also patient is a history of hypertension, status post renal stent.  Encouraged her to use nebulizer at home.  She will let me know if she has not better.

## 2017-11-03 NOTE — Patient Instructions (Signed)
Please use your nebulizer as prescribed- this will help break up the congestion  Plenty of water- mucinex (PLAIN) if needed  Start azithromycin  Let me know if not better or if you need any help with nebulizer equipment- I would call [facility] and see if you need prescription ( I have provided today)

## 2017-11-07 NOTE — Progress Notes (Signed)
Left message to return call 

## 2017-11-16 NOTE — Progress Notes (Signed)
Spoke with patient she hasn't returned my call , .  She states cough is usually in am , productive clear phlegm.    She is requesting cough medication be called in.  I advised that she would need office visit .

## 2017-11-17 ENCOUNTER — Telehealth: Payer: Self-pay | Admitting: Family

## 2017-11-17 ENCOUNTER — Ambulatory Visit: Payer: Medicare Other | Admitting: Podiatry

## 2017-11-17 DIAGNOSIS — R059 Cough, unspecified: Secondary | ICD-10-CM

## 2017-11-17 DIAGNOSIS — R05 Cough: Secondary | ICD-10-CM

## 2017-11-17 MED ORDER — BENZONATATE 100 MG PO CAPS: 100.0000 mg | ORAL_CAPSULE | Freq: Two times a day (BID) | ORAL | 0 refills | Status: DC | PRN

## 2017-11-17 NOTE — Telephone Encounter (Signed)
Call pt  I have sent in tessalon perles  Please let us know if she is not improving  If no improvement, please have her come in next week for ov

## 2017-11-20 ENCOUNTER — Encounter: Payer: Self-pay | Admitting: Family

## 2017-11-20 ENCOUNTER — Ambulatory Visit (INDEPENDENT_AMBULATORY_CARE_PROVIDER_SITE_OTHER): Payer: Medicare Other | Admitting: Family

## 2017-11-20 VITALS — BP 134/86 | HR 77 | Temp 98.2°F | Wt 170.5 lb

## 2017-11-20 DIAGNOSIS — R05 Cough: Secondary | ICD-10-CM

## 2017-11-20 DIAGNOSIS — J40 Bronchitis, not specified as acute or chronic: Secondary | ICD-10-CM

## 2017-11-20 DIAGNOSIS — R059 Cough, unspecified: Secondary | ICD-10-CM

## 2017-11-20 NOTE — Patient Instructions (Signed)
Tess[facility]on perles at [facility].   Plenty of water  Glad you feeling better

## 2017-11-20 NOTE — Progress Notes (Signed)
Subjective:    Patient ID: Patricia Fields, female    DOB: 1956-03-13, 62 y.o.   MRN: [id number]  CC: Patricia Fields is a 62 y.o. female who presents today for an acute visit.    HPI: Cough, sob has improved.  Completed antibiotic. bringing up phelgm.  Would like something for cough. No fever.  Notes cannot take Mucinex. Not using nebulizer.      HISTORY:  Past Medical History:  Diagnosis Date  . 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 Chronic kidney disease    renal stent  . COPD (chronic obstructive pulmonary disease) (HCC)   . History of nephrolithiasis   . HOH (hard of hearing)   . Hyperlipidemia   . Hypertension   . Interstitial cystitis   . Meniere disease   . Peripheral arterial disease [facility]) Feb 2011   by renal artery duplex  . Tobacco abuse    one pack daily   Past Surgical History:  Procedure Laterality Date  . ABDOMINAL HYSTERECTOMY    . arthroscopy     left knee , remote  . BREAST EXCISIONAL BIOPSY Left 1980's   NEG  . BREAST SURGERY     left breast cyst, benign  . CARPAL TUNNEL RELEASE     Dr. Hyacinth Meeker,  right hand   . CATARACT EXTRACTION W/PHACO Right 03/05/2015   Procedure: CATARACT EXTRACTION PHACO AND INTRAOCULAR LENS PLACEMENT (IOC);  Surgeon: Lia Hopping, MD;  Location: [facility] ORS;  Service: Ophthalmology;  Laterality: Right;  [place]: 00:52.3 AP%: 15.2 CDE: 7.96  . KNEE ARTHROSCOPY Left   . TOTAL ABDOMINAL HYSTERECTOMY W/ BILATERAL SALPINGOOPHORECTOMY  1992   due to uterine tumor, and endometriosis    Family History  Problem Relation Age of Onset  . Coronary artery disease Mother   . COPD Mother   . Heart disease Mother   . Breast cancer Maternal Grandmother     Allergies: Bee venom and Tomato Current Outpatient Medications on File Prior to Visit  Medication Sig Dispense Refill  . albuterol (PROVENTIL) (2.5 MG/3ML) 0.083% nebulizer solution Take 3 mLs (2.5 mg total) by nebulization every 8 (eight) hours as needed for  wheezing or shortness of breath. 150 mL 1  . amitriptyline (ELAVIL) 25 MG tablet Take 1 tablet (25 mg total) by mouth at bedtime. 30 tablet 1  . amLODipine (NORVASC) 2.5 MG tablet Take 1 tablet (2.5 mg total) by mouth daily. 90 tablet 3  . atorvastatin (LIPITOR) 40 MG tablet TAKE 1 TABLET BY MOUTH  DAILY 90 tablet 0  . buPROPion (WELLBUTRIN SR) 150 MG 12 hr tablet TAKE 1 TABLET BY MOUTH TWO  TIMES DAILY 180 tablet 1  . Cholecalciferol (VITAMIN D3) 1000 UNITS CAPS Take by mouth.      . losartan-hydrochlorothiazide (HYZAAR) 100-25 MG tablet TAKE 1 TABLET BY MOUTH  DAILY 90 tablet 1  . metoprolol succinate (TOPROL-XL) 50 MG 24 hr tablet TAKE 1 TABLET BY MOUTH  DAILY 90 tablet 0  . PARoxetine (PAXIL) 10 MG tablet TAKE 1 TABLET BY MOUTH  EVERY MORNING 90 tablet 0  . pentosan polysulfate (ELMIRON) 100 MG capsule Take 1 capsule (100 mg total) by mouth 3 (three) times daily. 90 capsule 1  . vitamin E 100 UNIT capsule Take 100 Units by mouth daily.    . Albuterol Sulfate 108 (90 Base) MCG/ACT AEPB Inhale 1 puff into the lungs every 8 (eight) hours as needed. For wheezing (Patient not taking: Reported on 11/20/2017) 3  each 0  . ergocalciferol (DRISDOL) 50000 UNITS capsule Take 1 capsule (50,000 Units total) by mouth once a week. (Patient not taking: Reported on 11/20/2017) 12 capsule 0  . nicotine (NICODERM CQ - DOSED IN MG/24 HOURS) 21 mg/24hr patch Place 1 patch (21 mg total) onto the skin daily. (Patient not taking: Reported on 11/20/2017) 28 patch 3  . potassium chloride SA (K-DUR,KLOR-CON) 20 MEQ tablet TAKE 1 TABLET BY MOUTH  DAILY (Patient not taking: Reported on 11/20/2017) 90 tablet 0   Current Facility-Administered Medications on File Prior to Visit  Medication Dose Route Frequency Provider Last Rate Last Dose  . betamethasone acetate-betamethasone sodium phosphate (CELESTONE) injection 3 mg  3 mg Intramuscular Once Evans, Brent M, DPM      . ipratropium-albuterol (DUONEB) 0.5-2.5 (3) MG/3ML nebulizer  solution 3 mL  3 mL Nebulization Q6H Allegra Grana,  G, FNP        Social History   Tobacco Use  . Smoking status: Current Every Day Smoker    Packs/day: 1.00    Types: Cigarettes  . Smokeless tobacco: Never Used  Substance Use Topics  . Alcohol use: No    Alcohol/week: 0.0 oz  . Drug use: No    Review of Systems  Constitutional: Negative for chills and fever.  HENT: Positive for congestion.   Respiratory: Positive for cough. Negative for shortness of breath and wheezing.   Cardiovascular: Negative for chest pain and palpitations.  Gastrointestinal: Negative for nausea and vomiting.      Objective:    BP 134/86 (BP Location: Left Arm, Patient Position: Sitting, Cuff Size: Large)   Pulse 77   Temp 98.2 F (36.8 C) (Oral)   Wt 170 lb 8 oz (77.3 kg)   SpO2 95%   BMI 34.73 kg/m    Physical Exam  Constitutional: She appears well-developed and well-nourished.  Eyes: Conjunctivae are normal.  Cardiovascular: Normal rate, regular rhythm, normal heart sounds and normal pulses.  Pulmonary/Chest: Effort normal and breath sounds normal. She has no wheezes. She has no rhonchi. She has no rales.  Neurological: She is alert.  Skin: Skin is warm and dry.  Psychiatric: She has a normal mood and affect. Her speech is normal and behavior is normal. Thought content normal.  Vitals reviewed.      Assessment & Plan:   1. Cough/ Bronchitis Clinical improvement.  sa02 95%. Patient is afebrile and in no acute respiratory distress.  Advised she may pick up Jerilynn Somessalon Perles which I sent into [facility] last week.  She will do this today or tomorrow.  She will let me know if cough dose not completely resolve.   I have discontinued Patricia Fields's azithromycin and benzonatate. I am also having her maintain her Vitamin D3, vitamin E, ergocalciferol, pentosan polysulfate, amitriptyline, Albuterol Sulfate, albuterol, nicotine, amLODipine, buPROPion, losartan-hydrochlorothiazide, metoprolol  succinate, PARoxetine, potassium chloride SA, and atorvastatin. We will continue to administer ipratropium-albuterol and betamethasone acetate-betamethasone sodium phosphate.   No orders of the defined types were placed in this encounter.   Return precautions given.   Risks, benefits, and alternatives of the medications and treatment plan prescribed today were discussed, and patient expressed understanding.   Education regarding symptom management and diagnosis given to patient on AVS.  Continue to follow with Sherlene Shamsullo, Teresa L, MD for routine health maintenance.   Patricia Kinnieriane R Modi and I agreed with plan.   Rennie Plowman , FNP

## 2017-11-20 NOTE — Telephone Encounter (Signed)
Patient seen 11/20/17

## 2017-11-21 DIAGNOSIS — Z87442 Personal history of urinary calculi: Secondary | ICD-10-CM | POA: Diagnosis not present

## 2017-11-21 DIAGNOSIS — I10 Essential (primary) hypertension: Secondary | ICD-10-CM | POA: Diagnosis not present

## 2017-11-21 DIAGNOSIS — Z8249 Family history of ischemic heart disease and other diseases of the circulatory system: Secondary | ICD-10-CM | POA: Diagnosis not present

## 2017-11-21 DIAGNOSIS — Z833 Family history of diabetes mellitus: Secondary | ICD-10-CM | POA: Diagnosis not present

## 2017-11-21 DIAGNOSIS — Z8679 Personal history of other diseases of the circulatory system: Secondary | ICD-10-CM | POA: Diagnosis not present

## 2017-11-21 DIAGNOSIS — E119 Type 2 diabetes mellitus without complications: Secondary | ICD-10-CM | POA: Diagnosis not present

## 2017-11-21 DIAGNOSIS — Z8269 Family history of other diseases of the musculoskeletal system and connective tissue: Secondary | ICD-10-CM | POA: Diagnosis not present

## 2017-11-21 DIAGNOSIS — Z91030 Bee allergy status: Secondary | ICD-10-CM | POA: Diagnosis not present

## 2017-11-21 DIAGNOSIS — Z91018 Allergy to other foods: Secondary | ICD-10-CM | POA: Diagnosis not present

## 2017-11-21 DIAGNOSIS — I6523 Occlusion and stenosis of bilateral carotid arteries: Secondary | ICD-10-CM | POA: Diagnosis not present

## 2017-11-21 DIAGNOSIS — Z95828 Presence of other vascular implants and grafts: Secondary | ICD-10-CM | POA: Diagnosis not present

## 2017-11-21 DIAGNOSIS — Z7982 Long term (current) use of aspirin: Secondary | ICD-10-CM | POA: Diagnosis not present

## 2017-11-21 DIAGNOSIS — R6889 Other general symptoms and signs: Secondary | ICD-10-CM | POA: Diagnosis not present

## 2017-11-21 DIAGNOSIS — I701 Atherosclerosis of renal artery: Secondary | ICD-10-CM | POA: Diagnosis not present

## 2017-11-21 DIAGNOSIS — Z720 Tobacco use: Secondary | ICD-10-CM | POA: Diagnosis not present

## 2017-11-21 DIAGNOSIS — I6521 Occlusion and stenosis of right carotid artery: Secondary | ICD-10-CM | POA: Diagnosis not present

## 2017-11-21 DIAGNOSIS — I6529 Occlusion and stenosis of unspecified carotid artery: Secondary | ICD-10-CM | POA: Diagnosis not present

## 2017-11-21 DIAGNOSIS — N3020 Other chronic cystitis without hematuria: Secondary | ICD-10-CM | POA: Diagnosis not present

## 2017-11-21 DIAGNOSIS — Z79899 Other long term (current) drug therapy: Secondary | ICD-10-CM | POA: Diagnosis not present

## 2017-11-21 DIAGNOSIS — E785 Hyperlipidemia, unspecified: Secondary | ICD-10-CM | POA: Diagnosis not present

## 2017-11-21 DIAGNOSIS — Z841 Family history of disorders of kidney and ureter: Secondary | ICD-10-CM | POA: Diagnosis not present

## 2017-11-28 ENCOUNTER — Telehealth: Payer: Self-pay | Admitting: *Deleted

## 2017-11-28 ENCOUNTER — Ambulatory Visit: Payer: Medicare Other

## 2017-11-28 ENCOUNTER — Ambulatory Visit: Payer: Medicare Other | Admitting: Podiatry

## 2017-11-28 DIAGNOSIS — R52 Pain, unspecified: Secondary | ICD-10-CM

## 2017-11-28 DIAGNOSIS — M779 Enthesopathy, unspecified: Secondary | ICD-10-CM

## 2017-11-28 DIAGNOSIS — M2042 Other hammer toe(s) (acquired), left foot: Secondary | ICD-10-CM

## 2017-11-28 DIAGNOSIS — M79676 Pain in unspecified toe(s): Secondary | ICD-10-CM | POA: Diagnosis not present

## 2017-11-28 DIAGNOSIS — M778 Other enthesopathies, not elsewhere classified: Secondary | ICD-10-CM

## 2017-11-28 DIAGNOSIS — B351 Tinea unguium: Secondary | ICD-10-CM | POA: Diagnosis not present

## 2017-11-28 NOTE — Patient Instructions (Signed)
Pre-Operative Instructions  Congratulations, you have decided to take an important step towards improving your quality of life.  You can be assured that the doctors and staff at [facility] will be with you every step of the way.  Here are some important things you should know:  1. Plan to be at the surgery center/hospital at least 1 (one) hour prior to your scheduled time, unless otherwise directed by the surgical center/hospital staff.  You must have a responsible adult accompany you, remain during the surgery and drive you home.  Make sure you have directions to the surgical center/hospital to ensure you arrive on time. 2. If you are having surgery at [facility] or [facility] hospitals, you will need a copy of your medical history and physical form from your family physician within one month prior to the date of surgery. We will give you a form for your primary physician to complete.  3. We make every effort to accommodate the date you request for surgery.  However, there are times where surgery dates or times have to be moved.  We will contact you as soon as possible if a change in schedule is required.   4. No aspirin/ibuprofen for one week before surgery.  If you are on aspirin, any non-steroidal anti-inflammatory medications (Mobic, Aleve, Ibuprofen) should not be taken seven (7) days prior to your surgery.  You make take Tylenol for pain prior to surgery.  5. Medications - If you are taking daily heart and blood pressure medications, seizure, reflux, allergy, asthma, anxiety, pain or diabetes medications, make sure you notify the surgery center/hospital before the day of surgery so they can tell you which medications you should take or avoid the day of surgery. 6. No food or drink after midnight the night before surgery unless directed otherwise by surgical center/hospital staff. 7. No alcoholic beverages 24-hours prior to surgery.  No smoking 24-hours prior or 24-hours after  surgery. 8. Wear loose pants or shorts. They should be loose enough to fit over bandages, boots, and casts. 9. Don't wear slip-on shoes. Sneakers are preferred. 10. Bring your boot with you to the surgery center/hospital.  Also bring crutches or a walker if your physician has prescribed it for you.  If you do not have this equipment, it will be provided for you after surgery. 11. If you have not been contacted by the surgery center/hospital by the day before your surgery, call to confirm the date and time of your surgery. 12. Leave-time from work may vary depending on the type of surgery you have.  Appropriate arrangements should be made prior to surgery with your employer. 13. Prescriptions will be provided immediately following surgery by your doctor.  Fill these as soon as possible after surgery and take the medication as directed. Pain medications will not be refilled on weekends and must be approved by the doctor. 14. Remove nail polish on the operative foot and avoid getting pedicures prior to surgery. 15. Wash the night before surgery.  The night before surgery wash the foot and leg well with water and the antibacterial soap provided. Be sure to pay special attention to beneath the toenails and in between the toes.  Wash for at least three (3) minutes. Rinse thoroughly with water and dry well with a towel.  Perform this wash unless told not to do so by your physician.  Enclosed: 1 Ice pack (please put in freezer the night before surgery)   1 Hibiclens skin cleaner     Pre-op instructions  If you have any questions regarding the instructions, please do not hesitate to call our office.  [place]: [address], [place], [place] [address] -- [phone]  [place]: [address] [place], [place] [address] -- [phone]  [place]: [address]  [place], [place] [address] -- [phone]  [place]: [address], [address], [place], [place] [address] -- [phone]  Website:  [email or web address] 

## 2017-11-28 NOTE — Telephone Encounter (Signed)
"  I want to schedule my surgery."  Have you signed your consent forms?  "I was just there today.  I guess I signed all that stuff."  Do you have a date in mind?  "You tell me what his next available date is."  He can do your surgery on April 18 or 25.  "I want to do it the soonest."  I will get it scheduled for April 18.  Someone from the surgical center will call you and give you your arrival time.  (Send paperwork please)

## 2017-11-29 ENCOUNTER — Other Ambulatory Visit: Payer: Self-pay

## 2017-11-29 ENCOUNTER — Encounter: Payer: Self-pay | Admitting: Internal Medicine

## 2017-11-29 ENCOUNTER — Ambulatory Visit (INDEPENDENT_AMBULATORY_CARE_PROVIDER_SITE_OTHER): Payer: Medicare Other | Admitting: Internal Medicine

## 2017-11-29 ENCOUNTER — Encounter: Payer: Self-pay | Admitting: *Deleted

## 2017-11-29 VITALS — BP 138/76 | HR 77 | Temp 97.9°F | Wt 173.0 lb

## 2017-11-29 DIAGNOSIS — D72829 Elevated white blood cell count, unspecified: Secondary | ICD-10-CM

## 2017-11-29 DIAGNOSIS — Z01818 Encounter for other preprocedural examination: Secondary | ICD-10-CM

## 2017-11-29 DIAGNOSIS — E1151 Type 2 diabetes mellitus with diabetic peripheral angiopathy without gangrene: Secondary | ICD-10-CM

## 2017-11-29 DIAGNOSIS — E782 Mixed hyperlipidemia: Secondary | ICD-10-CM

## 2017-11-29 DIAGNOSIS — Z716 Tobacco abuse counseling: Secondary | ICD-10-CM | POA: Diagnosis not present

## 2017-11-29 LAB — COMPREHENSIVE METABOLIC PANEL
ALBUMIN: 4 g/dL (ref 3.5–5.2)
ALT: 16 U/L (ref 0–35)
AST: 13 U/L (ref 0–37)
Alkaline Phosphatase: 89 U/L (ref 39–117)
BUN: 10 mg/dL (ref 6–23)
CO2: 28 meq/L (ref 19–32)
Calcium: 9.1 mg/dL (ref 8.4–10.5)
Chloride: 106 mEq/L (ref 96–112)
Creatinine, Ser: 1.08 mg/dL (ref 0.40–1.20)
GFR: 54.52 mL/min — ABNORMAL LOW (ref >60.00)
Glucose, Bld: 112 mg/dL — ABNORMAL HIGH (ref 70–99)
POTASSIUM: 3.4 meq/L — AB (ref 3.5–5.1)
SODIUM: 140 meq/L (ref 135–145)
Total Bilirubin: 0.3 mg/dL (ref 0.2–1.2)
Total Protein: 7.1 g/dL (ref 6.0–8.3)

## 2017-11-29 LAB — CBC WITH DIFFERENTIAL/PLATELET
BASOS PCT: 0.5 % (ref 0.0–3.0)
Basophils Absolute: 0.1 10*3/uL (ref 0.0–0.1)
EOS PCT: 0.5 % (ref 0.0–5.0)
Eosinophils Absolute: 0.1 10*3/uL (ref 0.0–0.7)
HEMATOCRIT: 38.9 % (ref 36.0–46.0)
HEMOGLOBIN: 13.2 g/dL (ref 12.0–15.0)
Lymphocytes Relative: 20.7 % (ref 12.0–46.0)
Lymphs Abs: 3.2 10*3/uL (ref 0.7–4.0)
MCHC: 33.8 g/dL (ref 30.0–36.0)
MCV: 94.1 fl (ref 78.0–100.0)
MONO ABS: 1 10*3/uL (ref 0.1–1.0)
Monocytes Relative: 6.7 % (ref 3.0–12.0)
Neutro Abs: 10.9 10*3/uL — ABNORMAL HIGH (ref 1.4–7.7)
Neutrophils Relative %: 71.6 % (ref 43.0–77.0)
Platelets: 279 10*3/uL (ref 150.0–400.0)
RBC: 4.14 Mil/uL (ref 3.87–5.11)
RDW: 13.7 % (ref 11.5–15.5)
WBC: 15.2 10*3/uL — AB (ref 4.0–10.5)

## 2017-11-29 LAB — MICROALBUMIN / CREATININE URINE RATIO
CREATININE, U: 65.9 mg/dL
MICROALB UR: 3.2 mg/dL — AB (ref 0.0–1.9)
MICROALB/CREAT RATIO: 4.9 mg/g (ref 0.0–30.0)

## 2017-11-29 LAB — HEMOGLOBIN A1C: HEMOGLOBIN A1C: 6.4 % (ref 4.6–6.5)

## 2017-11-29 LAB — TSH: TSH: 0.88 u[IU]/mL (ref 0.35–4.50)

## 2017-11-29 NOTE — Patient Instructions (Signed)
Diabetes Mellitus and Standards of Medical Care Managing diabetes (diabetes mellitus) can be complicated. Your diabetes treatment may be managed by a team of health care providers, including:  A diet and nutrition specialist (registered dietitian).  A nurse.  A certified diabetes educator (CDE).  A diabetes specialist (endocrinologist).  An eye doctor.  A primary care provider.  A dentist.  Your health care providers follow a schedule in order to help you get the best quality of care. The following schedule is a general guideline for your diabetes management plan. Your health care providers may also give you more specific instructions. HbA1c ( hemoglobin A1c) test This test provides information about blood sugar (glucose) control over the previous 2-3 months. It is used to check whether your diabetes management plan needs to be adjusted.  If you are meeting your treatment goals, this test is done at least 2 times a year.  If you are not meeting treatment goals or if your treatment goals have changed, this test is done 4 times a year.  Blood pressure test  This test is done at every routine medical visit. For most people, the goal is less than 130/80. Ask your health care provider what your goal blood pressure should be. Dental and eye exams  Visit your dentist two times a year.  If you have type 1 diabetes, get an eye exam 3-5 years after you are diagnosed, and then once a year after your first exam. ? If you were diagnosed with type 1 diabetes as a child, get an eye exam when you are age 16 or older and have had diabetes for 3-5 years. After the first exam, you should get an eye exam once a year.  If you have type 2 diabetes, have an eye exam as soon as you are diagnosed, and then once a year after your first exam. Foot care exam  Visual foot exams are done at every routine medical visit. The exams check for cuts, bruises, redness, blisters, sores, or other problems with the  feet.  A complete foot exam is done by your health care provider once a year. This exam includes an inspection of the structure and skin of your feet, and a check of the pulses and sensation in your feet. ? Type 1 diabetes: Get your first exam 3-5 years after diagnosis. ? Type 2 diabetes: Get your first exam as soon as you are diagnosed.  Check your feet every day for cuts, bruises, redness, blisters, or sores. If you have any of these or other problems that are not healing, contact your health care provider. Kidney function test ( urine microalbumin)  This test is done once a year. ? Type 1 diabetes: Get your first test 5 years after diagnosis. ? Type 2 diabetes: Get your first test as soon as you are diagnosed.  If you have chronic kidney disease (CKD), get a serum creatinine and estimated glomerular filtration rate (eGFR) test once a year. Lipid profile (cholesterol, HDL, LDL, triglycerides)  This test should be done when you are diagnosed with diabetes, and every 5 years after the first test. If you are on medicines to lower your cholesterol, you may need to get this test done every year. ? The goal for LDL is less than 100 mg/dL (5.5 mmol/L). If you are at high risk, the goal is less than 70 mg/dL (3.9 mmol/L). ? The goal for HDL is 40 mg/dL (2.2 mmol/L) for men and 50 mg/dL(2.8 mmol/L) for women. An HDL  cholesterol of 60 mg/dL (3.3 mmol/L) or higher gives some protection against heart disease. ? The goal for triglycerides is less than 150 mg/dL (8.3 mmol/L). Immunizations  The yearly flu (influenza) vaccine is recommended for everyone 6 months or older who has diabetes.  The pneumonia (pneumococcal) vaccine is recommended for everyone 2 years or older who has diabetes. If you are 97 or older, you may get the pneumonia vaccine as a series of two separate shots.  The hepatitis B vaccine is recommended for adults shortly after they have been diagnosed with diabetes.  The Tdap  (tetanus, diphtheria, and pertussis) vaccine should be given: ? According to normal childhood vaccination schedules, for children. ? Every 10 years, for adults who have diabetes.  The shingles vaccine is recommended for people who have had chicken pox and are 50 years or older. Mental and emotional health  Screening for symptoms of eating disorders, anxiety, and depression is recommended at the time of diagnosis and afterward as needed. If your screening shows that you have symptoms (you have a positive screening result), you may need further evaluation and be referred to a mental health care provider. Diabetes self-management education  Education about how to manage your diabetes is recommended at diagnosis and ongoing as needed. Treatment plan  Your treatment plan will be reviewed at every medical visit. Summary  Managing diabetes (diabetes mellitus) can be complicated. Your diabetes treatment may be managed by a team of health care providers.  Your health care providers follow a schedule in order to help you get the best quality of care.  Standards of care including having regular physical exams, blood tests, blood pressure monitoring, immunizations, screening tests, and education about how to manage your diabetes.  Your health care providers may also give you more specific instructions based on your individual health. This information is not intended to replace advice given to you by your health care provider. Make sure you discuss any questions you have with your health care provider. Document Released: 06/05/2009 Document Revised: 05/06/2016 Document Reviewed: 05/06/2016 Elsevier Interactive Patient Education  [employer].

## 2017-11-29 NOTE — Progress Notes (Signed)
Subjective:  Patient ID: Patricia Fields, female    DOB: 12-25-1955  Age: 62 y.o. MRN: [id number]  CC: The primary encounter diagnosis was Preoperative clearance. Diagnoses of DM (diabetes mellitus), type 2 with peripheral vascular complications (HCC), Mixed hyperlipidemia, Tobacco abuse counseling, and Leukocytosis, unspecified type were also pertinent to this visit.  HPI LAMESHIA HYPOLITE presents for surgical  clearance for foot surgery on 5th toe capsulitis left foot   History of HTN , right renal artery stenosis s/p stent followed by  angioplasty July 2018 due to in stent thrombosis . Repeat doppler study was done last week and was normal   She has Type 2 DM  With no follow up since April 2018, when she was diagnosed. Does not check sugars.      walks for at least 10 minutes daily  Stopped by right hip.  Diet is not low glycemic has at least 3 starch servings daily    Had carotid artery [place] recently,  Non critical stenosis  Still having  left sided facial itching from varicella zoster which left her with a hearing loss in 2015    Still smoking,      Outpatient Medications Prior to Visit  Medication Sig Dispense Refill  . albuterol (PROVENTIL) (2.5 MG/3ML) 0.083% nebulizer solution Take 3 mLs (2.5 mg total) by nebulization every 8 (eight) hours as needed for wheezing or shortness of breath. 150 mL 1  . Albuterol Sulfate 108 (90 Base) MCG/ACT AEPB Inhale 1 puff into the lungs every 8 (eight) hours as needed. For wheezing 3 each 0  . amitriptyline (ELAVIL) 25 MG tablet Take 1 tablet (25 mg total) by mouth at bedtime. 30 tablet 1  . amLODipine (NORVASC) 2.5 MG tablet Take 1 tablet (2.5 mg total) by mouth daily. 90 tablet 3  . atorvastatin (LIPITOR) 40 MG tablet TAKE 1 TABLET BY MOUTH  DAILY 90 tablet 0  . buPROPion (WELLBUTRIN SR) 150 MG 12 hr tablet TAKE 1 TABLET BY MOUTH TWO  TIMES DAILY 180 tablet 1  . Cholecalciferol (VITAMIN D3) 1000 UNITS CAPS Take by mouth.      . ergocalciferol  (DRISDOL) 50000 UNITS capsule Take 1 capsule (50,000 Units total) by mouth once a week. 12 capsule 0  . losartan-hydrochlorothiazide (HYZAAR) 100-25 MG tablet TAKE 1 TABLET BY MOUTH  DAILY 90 tablet 1  . metoprolol succinate (TOPROL-XL) 50 MG 24 hr tablet TAKE 1 TABLET BY MOUTH  DAILY 90 tablet 0  . nicotine (NICODERM CQ - DOSED IN MG/24 HOURS) 21 mg/24hr patch Place 1 patch (21 mg total) onto the skin daily. 28 patch 3  . PARoxetine (PAXIL) 10 MG tablet TAKE 1 TABLET BY MOUTH  EVERY MORNING 90 tablet 0  . pentosan polysulfate (ELMIRON) 100 MG capsule Take 1 capsule (100 mg total) by mouth 3 (three) times daily. 90 capsule 1  . potassium chloride SA (K-DUR,KLOR-CON) 20 MEQ tablet TAKE 1 TABLET BY MOUTH  DAILY 90 tablet 0  . vitamin E 100 UNIT capsule Take 100 Units by mouth daily.     Facility-Administered Medications Prior to Visit  Medication Dose Route Frequency Provider Last Rate Last Dose  . betamethasone acetate-betamethasone sodium phosphate (CELESTONE) injection 3 mg  3 mg Intramuscular Once Evans, Brent M, DPM      . ipratropium-albuterol (DUONEB) 0.5-2.5 (3) MG/3ML nebulizer solution 3 mL  3 mL Nebulization Q6H Allegra Grana, FNP        Review of Systems;  Patient denies headache, fevers,  malaise, unintentional weight loss, skin rash, eye pain, sinus congestion and sinus pain, sore throat, dysphagia,  hemoptysis , cough, dyspnea, wheezing, chest pain, palpitations, orthopnea, edema, abdominal pain, nausea, melena, diarrhea, constipation, flank pain, dysuria, hematuria, urinary  Frequency, nocturia, numbness, tingling, seizures,  Focal weakness, Loss of consciousness,  Tremor, insomnia, depression, anxiety, and suicidal ideation.      Objective:  BP 138/76 (BP Location: Left Arm, Patient Position: Sitting, Cuff Size: Normal)   Pulse 77   Temp 97.9 F (36.6 C)   Wt 173 lb (78.5 kg)   SpO2 96%   BMI 35.24 kg/m   BP Readings from Last 3 Encounters:  11/29/17 138/76    11/20/17 134/86  11/03/17 138/72    Wt Readings from Last 3 Encounters:  11/29/17 173 lb (78.5 kg)  11/20/17 170 lb 8 oz (77.3 kg)  11/03/17 171 lb (77.6 kg)    General appearance: alert, cooperative and appears stated age Ears: normal TM's and external ear canals both ears Throat: lips, mucosa, and tongue normal; teeth and gums normal Neck: no adenopathy, no carotid bruit, supple, symmetrical, trachea midline and thyroid not enlarged, symmetric, no tenderness/mass/nodules Back: symmetric, no curvature. ROM normal. No CVA tenderness. Lungs: clear to auscultation bilaterally Heart: regular rate and rhythm, S1, S2 normal, no murmur, click, rub or gallop Abdomen: soft, non-tender; bowel sounds normal; no masses,  no organomegaly Pulses: 2+ and symmetric Skin: Skin color, texture, turgor normal. No rashes or lesions Lymph nodes: Cervical, supraclavicular, and axillary nodes normal.  Lab Results  Component Value Date   HGBA1C 6.4 11/29/2017   HGBA1C 6.5 10/13/2016   HGBA1C 6.0 01/25/2012    Lab Results  Component Value Date   CREATININE 1.08 11/29/2017   CREATININE 0.90 12/16/2016   CREATININE 0.93 10/13/2016    Lab Results  Component Value Date   WBC 15.2 (H) 11/29/2017   HGB 13.2 11/29/2017   HCT 38.9 11/29/2017   PLT 279.0 11/29/2017   GLUCOSE 112 (H) 11/29/2017   CHOL 236 (H) 10/13/2016   TRIG 206.0 (H) 10/13/2016   HDL 38.10 (L) 10/13/2016   LDLDIRECT 141.0 10/13/2016   LDLCALC 138 (H) 11/06/2015   ALT 16 11/29/2017   AST 13 11/29/2017   NA 140 11/29/2017   K 3.4 (L) 11/29/2017   CL 106 11/29/2017   CREATININE 1.08 11/29/2017   BUN 10 11/29/2017   CO2 28 11/29/2017   TSH 0.88 11/29/2017   HGBA1C 6.4 11/29/2017   MICROALBUR 3.2 (H) 11/29/2017    Mm Screening Breast Tomo Bilateral  Result Date: 09/30/2016 CLINICAL DATA:  Screening. EXAM: 2D DIGITAL SCREENING BILATERAL MAMMOGRAM WITH CAD AND ADJUNCT TOMO COMPARISON:  Previous exam(s). ACR Breast Density  Category c: The breast tissue is heterogeneously dense, which may obscure small masses. FINDINGS: There are no findings suspicious for malignancy. Images were processed with CAD. IMPRESSION: No mammographic evidence of malignancy. A result letter of this screening mammogram will be mailed directly to the patient. RECOMMENDATION: Screening mammogram in one year. (Code:SM-B-01Y) BI-RADS CATEGORY  1: Negative. Electronically Signed   By: Britta MccreedySusan  Turner M.D.   On: 09/30/2016 18:03    Assessment & Plan:   Problem List Items Addressed This Visit    Tobacco abuse counseling    Strongly advised to stop smoking prior to her surgery to ensure wound healing.       Preoperative clearance - Primary    She has diet controlled diabetes,  Peripheral vascular disease with ongoing tobacco abuse EKg done today  shows no evidence of arrhythmia or ischemic changes.  Moderate risk ,  Would continue  beta blocker.       Relevant Orders   EKG 12-Lead (Completed)   CBC with Differential/Platelet (Completed)   TSH (Completed)   Leukocytosis    Etiology unclear.  Will rule out uti      Relevant Orders   Urinalysis, Routine w reflex microscopic   Urine Culture   Hyperlipidemia    She is tolerating generic Lipitor given elevated risk , for goal LDL < 70.        Relevant Medications   aspirin EC 81 MG tablet   DM (diabetes mellitus), type 2 with peripheral vascular complications (HCC)    Diagnosed in A[ril 2018.  Well controlled by diet despite lack of follow up or dietary changes.  .  Smoking cessation strongly  advised. N eeds to start aspirin.  Continue lipitor, losartan.    Lab Results  Component Value Date   HGBA1C 6.4 11/29/2017   Lab Results  Component Value Date   ALT 16 11/29/2017   AST 13 11/29/2017   ALKPHOS 89 11/29/2017   BILITOT 0.3 11/29/2017   Lab Results  Component Value Date   CREATININE 1.08 11/29/2017   Lab Results  Component Value Date   MICROALBUR 3.2 (H) 11/29/2017          Relevant Medications   aspirin EC 81 MG tablet   Other Relevant Orders   Comprehensive metabolic panel (Completed)   Hemoglobin A1c (Completed)   Microalbumin / creatinine urine ratio (Completed)    A total of 40 minutes of face to face time was spent with patient more than half of which was spent in counselling and coordination of care   I am having Patricia Fields start on aspirin EC. I am also having her maintain her Vitamin D3, vitamin E, ergocalciferol, pentosan polysulfate, amitriptyline, Albuterol Sulfate, albuterol, nicotine, amLODipine, buPROPion, losartan-hydrochlorothiazide, metoprolol succinate, PARoxetine, potassium chloride SA, and atorvastatin. We will continue to administer ipratropium-albuterol and betamethasone acetate-betamethasone sodium phosphate.  Meds ordered this encounter  Medications  . aspirin EC 81 MG tablet    Sig: Take 1 tablet (81 mg total) by mouth daily.    Dispense:  90 tablet    Refill:  2    There are no discontinued medications.  Follow-up: No follow-ups on file.   Sherlene Shams, MD

## 2017-11-29 NOTE — Progress Notes (Signed)
    Subjective: Patient is a 62 y.o. female who is a current smoker of one pack per day for the past 20 years presenting to the office today with a chief complaint of a painful callus lesion to the medial side of the left fifth toe that has been present for the past two weeks. Applying pressure to the area increases the pain. She has not had any treatment.   Patient also complains of elongated, thickened nails that cause pain while ambulating in shoes. She is unable to trim her own nails. Patient presents today for further treatment and evaluation.  Past Medical History:  Diagnosis Date  . 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 Chronic kidney disease    renal stent  . COPD (chronic obstructive pulmonary disease) (HCC)   . History of nephrolithiasis   . HOH (hard of hearing)   . Hyperlipidemia   . Hypertension   . Interstitial cystitis   . Meniere disease   . Peripheral arterial disease [facility](HCC) Feb 2011   by renal artery duplex  . Tobacco abuse    one pack daily    Objective:  Physical Exam General: Alert and oriented x3 in no acute distress  Dermatology: Hyperkeratotic lesion present on the left fifth toe. Pain on palpation with a central nucleated core noted. Skin is warm, dry and supple bilateral lower extremities. Negative for open lesions or macerations. Nails are tender, long, thickened and dystrophic with subungual debris, consistent with onychomycosis, 1-5 bilateral. No signs of infection noted. Hammertoe contracture deformity noted to the 5th digit of the left foot  Vascular: Palpable pedal pulses bilaterally. No edema or erythema noted. Capillary refill within normal limits.  Neurological: Epicritic and protective threshold grossly intact bilaterally.   Musculoskeletal Exam: Pain on palpation at the keratotic lesion noted. Range of motion within normal limits bilateral. Muscle strength 5/5 in all groups bilateral.  Assessment: 1. Onychodystrophic  nails 1-5 bilateral with hyperkeratosis of nails.  2. Onychomycosis of nail due to dermatophyte bilateral 3. Porokeratosis to the left fifth toe 4. Hammertoe left fifth toe   Plan of Care:  1. Patient evaluated. 2. Excisional debridement of keratoic lesion using a chisel blade was performed without incident.  3. Dressed with light dressing. 4. Mechanical debridement of nails 1-5 bilaterally performed using a nail nipper. Filed with dremel without incident.  5. Today we discussed the conservative versus surgical management of the presenting pathology. The patient opts for surgical management. All possible complications and details of the procedure were explained. All patient questions were answered. No guarantees were expressed or implied. 6. Authorization for surgery was initiated today. Surgery will consist of PIPJ arthroplasty with derotational skin plasty left fifth digit.  7. Patient will need medical clearance from PCP prior to surgery.  8. Return to clinic one week post op.    Felecia ShellingBrent M. Evans, DPM [facility]  Dr. Felecia ShellingBrent M. Evans, DPM    [address]                                        [place], [place] [address]                Office [phone]  Fax [phone]

## 2017-11-30 DIAGNOSIS — D72829 Elevated white blood cell count, unspecified: Secondary | ICD-10-CM | POA: Insufficient documentation

## 2017-11-30 DIAGNOSIS — Z01818 Encounter for other preprocedural examination: Secondary | ICD-10-CM | POA: Insufficient documentation

## 2017-11-30 MED ORDER — ASPIRIN EC 81 MG PO TBEC: 81.0000 mg | DELAYED_RELEASE_TABLET | Freq: Every day | ORAL | 2 refills | Status: AC

## 2017-11-30 NOTE — Assessment & Plan Note (Signed)
She is tolerating generic Lipitor given elevated risk , for goal LDL < 70.

## 2017-11-30 NOTE — Assessment & Plan Note (Addendum)
Diagnosed in A[ril 2018.  Well controlled by diet despite lack of follow up or dietary changes.  .  Smoking cessation strongly  advised. N eeds to start aspirin.  Continue lipitor, losartan.    Lab Results  Component Value Date   HGBA1C 6.4 11/29/2017   Lab Results  Component Value Date   ALT 16 11/29/2017   AST 13 11/29/2017   ALKPHOS 89 11/29/2017   BILITOT 0.3 11/29/2017   Lab Results  Component Value Date   CREATININE 1.08 11/29/2017   Lab Results  Component Value Date   MICROALBUR 3.2 (H) 11/29/2017

## 2017-11-30 NOTE — Assessment & Plan Note (Addendum)
She has diet controlled diabetes,  Peripheral vascular disease with ongoing tobacco abuse EKg done today shows no evidence of arrhythmia or ischemic changes.  Moderate risk ,  Would continue  beta blocker.

## 2017-11-30 NOTE — Assessment & Plan Note (Signed)
Etiology unclear.  Will rule out uti

## 2017-11-30 NOTE — Assessment & Plan Note (Signed)
Strongly advised to stop smoking prior to her surgery to ensure wound healing.

## 2017-12-04 ENCOUNTER — Telehealth: Payer: Self-pay | Admitting: Internal Medicine

## 2017-12-04 NOTE — Telephone Encounter (Signed)
Copied from CRM [id number]. Topic: Quick Communication - See Telephone Encounter >> Dec 04, 2017 12:58 PM Clack, Princella PellegriniJessica D wrote: CRM for notification. See Telephone encounter for: 12/04/17.  Pt would like a call for her lab results.

## 2017-12-04 NOTE — Telephone Encounter (Signed)
See result note message 

## 2017-12-04 NOTE — Telephone Encounter (Signed)
Please advise 

## 2017-12-05 ENCOUNTER — Other Ambulatory Visit (INDEPENDENT_AMBULATORY_CARE_PROVIDER_SITE_OTHER): Payer: Medicare Other

## 2017-12-05 DIAGNOSIS — D72829 Elevated white blood cell count, unspecified: Secondary | ICD-10-CM | POA: Diagnosis not present

## 2017-12-05 LAB — URINALYSIS, ROUTINE W REFLEX MICROSCOPIC
Bilirubin Urine: NEGATIVE
Hgb urine dipstick: NEGATIVE
Ketones, ur: NEGATIVE
Leukocytes, UA: NEGATIVE
Nitrite: NEGATIVE
PH: 6 (ref 5.0–8.0)
SPECIFIC GRAVITY, URINE: 1.025 (ref 1.000–1.030)
TOTAL PROTEIN, URINE-UPE24: NEGATIVE
UROBILINOGEN UA: 0.2 (ref 0.0–1.0)
Urine Glucose: NEGATIVE

## 2017-12-06 ENCOUNTER — Telehealth: Payer: Self-pay | Admitting: Podiatry

## 2017-12-06 NOTE — Telephone Encounter (Signed)
[facility] and stated she possibly has a UTI. I call the pt to see if she had heard from her doctor and she said she had not. I told her once she got the results to either call us or [facility] to let them know what her doctor said. Pt stated she would call the doctor's office herself because she thought they would have called her back yesterday.

## 2017-12-06 NOTE — Telephone Encounter (Signed)
It looks like patient was given lab results on yesterday, but I looks like the Urine has not been resulted yet. I'm assuming that's what she is calling about. Please advise.

## 2017-12-06 NOTE — Telephone Encounter (Signed)
Pt needs her results. Pt is having surgery on her toe tomorrow

## 2017-12-07 NOTE — Telephone Encounter (Signed)
Pt notified yesterday via mychart.

## 2017-12-14 ENCOUNTER — Encounter: Payer: Self-pay | Admitting: Podiatry

## 2017-12-14 DIAGNOSIS — E7800 Pure hypercholesterolemia, unspecified: Secondary | ICD-10-CM | POA: Diagnosis not present

## 2017-12-14 DIAGNOSIS — M2042 Other hammer toe(s) (acquired), left foot: Secondary | ICD-10-CM | POA: Diagnosis not present

## 2017-12-15 ENCOUNTER — Encounter: Payer: Medicare Other | Admitting: Podiatry

## 2017-12-20 NOTE — Progress Notes (Signed)
DOS: 12-14-2017 Fifth toe arthroplasty left foot

## 2017-12-22 ENCOUNTER — Ambulatory Visit (INDEPENDENT_AMBULATORY_CARE_PROVIDER_SITE_OTHER): Payer: Medicare HMO

## 2017-12-22 ENCOUNTER — Ambulatory Visit (INDEPENDENT_AMBULATORY_CARE_PROVIDER_SITE_OTHER): Payer: Medicare HMO | Admitting: Podiatry

## 2017-12-22 ENCOUNTER — Encounter: Payer: Self-pay | Admitting: Podiatry

## 2017-12-22 DIAGNOSIS — M2042 Other hammer toe(s) (acquired), left foot: Secondary | ICD-10-CM

## 2017-12-22 DIAGNOSIS — Z9889 Other specified postprocedural states: Secondary | ICD-10-CM

## 2017-12-25 NOTE — Progress Notes (Signed)
   Subjective:  Patient presents today status post 5th toe arthroplasty left. DOS: 12/14/17. She states she is doing well overall. She reports the incisions look well. Patient is here for further evaluation and treatment.    Past Medical History:  Diagnosis Date  . 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 Chronic kidney disease    renal stent  . COPD (chronic obstructive pulmonary disease) (HCC)   . History of nephrolithiasis   . HOH (hard of hearing)   . Hyperlipidemia   . Hypertension   . Interstitial cystitis   . Meniere disease   . Peripheral arterial disease [facility]) Feb 2011   by renal artery duplex  . Tobacco abuse    one pack daily      Objective/Physical Exam Neurovascular status intact.  Skin incisions appear to be well coapted with sutures and staples intact. No sign of infectious process noted. No dehiscence. No active bleeding noted. Moderate edema noted to the surgical extremity.  Radiographic Exam:  Osteotomies sites appear to be stable with routine healing.  Assessment: 1. s/p 5th toe arthroplasty left. DOS: 12/14/17   Plan of Care:  1. Patient was evaluated. X-rays reviewed 2. Dressing changed.  3. Continue weightbearing in post op shoe.  4. Return to clinic in one week for suture removal.     Felecia Shelling, DPM [facility]  Dr. Felecia Shelling, DPM    [address]                                        [place], [place] [address]                Office [phone]  Fax [phone]

## 2017-12-29 ENCOUNTER — Ambulatory Visit (INDEPENDENT_AMBULATORY_CARE_PROVIDER_SITE_OTHER): Payer: Medicare Other | Admitting: Podiatry

## 2017-12-29 ENCOUNTER — Encounter: Payer: Self-pay | Admitting: Podiatry

## 2017-12-29 DIAGNOSIS — Z9889 Other specified postprocedural states: Secondary | ICD-10-CM

## 2017-12-29 DIAGNOSIS — M2042 Other hammer toe(s) (acquired), left foot: Secondary | ICD-10-CM

## 2018-01-01 NOTE — Progress Notes (Signed)
   Subjective:  Patient presents today status post 5th toe arthroplasty left. DOS: 12/14/17. She states she is doing well overall. She reports some mild swelling but no other complaints. Patient is here for further evaluation and treatment.    Past Medical History:  Diagnosis Date  . 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 Chronic kidney disease    renal stent  . COPD (chronic obstructive pulmonary disease) (HCC)   . History of nephrolithiasis   . HOH (hard of hearing)   . Hyperlipidemia   . Hypertension   . Interstitial cystitis   . Meniere disease   . Peripheral arterial disease [facility]) Feb 2011   by renal artery duplex  . Tobacco abuse    one pack daily      Objective/Physical Exam Neurovascular status intact.  Skin incisions appear to be well coapted with sutures and staples intact. No sign of infectious process noted. No dehiscence. No active bleeding noted. Moderate edema noted to the surgical extremity.  Assessment: 1. s/p 5th toe arthroplasty left. DOS: 12/14/17   Plan of Care:  1. Patient was evaluated.  2. Sutures removed.  3. Recommended using antibiotic cream daily with a Band-Aid.  4. Resume wearing normal shoe gear.  5. Return to clinic in 4 weeks.     Felecia Shelling, DPM [facility]  Dr. Felecia Shelling, DPM    [address]                                        [place], [place] [address]                Office [phone]  Fax [phone]

## 2018-01-09 ENCOUNTER — Ambulatory Visit (INDEPENDENT_AMBULATORY_CARE_PROVIDER_SITE_OTHER): Payer: Medicare HMO

## 2018-01-09 ENCOUNTER — Ambulatory Visit: Payer: Medicare Other

## 2018-01-09 VITALS — BP 138/76 | HR 98 | Temp 98.3°F | Resp 15 | Ht <= 58 in | Wt 167.1 lb

## 2018-01-09 DIAGNOSIS — E7800 Pure hypercholesterolemia, unspecified: Secondary | ICD-10-CM | POA: Diagnosis not present

## 2018-01-09 DIAGNOSIS — Z0000 Encounter for general adult medical examination without abnormal findings: Secondary | ICD-10-CM | POA: Diagnosis not present

## 2018-01-09 LAB — LIPID PANEL
CHOLESTEROL: 174 mg/dL (ref 0–200)
HDL: 42.8 mg/dL (ref >39.00)
LDL Cholesterol: 93 mg/dL (ref 0–99)
NonHDL: 130.92
TRIGLYCERIDES: 190 mg/dL — AB (ref 0.0–149.0)
Total CHOL/HDL Ratio: 4
VLDL: 38 mg/dL (ref 0.0–40.0)

## 2018-01-09 NOTE — Patient Instructions (Addendum)
  Patricia Fields , Thank you for taking time to come for your Medicare Wellness Visit. I appreciate your ongoing commitment to your health goals. Please review the following plan we discussed and let me know if I can assist you in the future.   Make a 6 month follow up appt with Dr Darrick Huntsman in September   Try to quit smoking.   These are the goals we discussed: Goals    . DIET - INCREASE WATER INTAKE    . DIET - REDUCE SUGAR INTAKE    . Increase physical activity     Walk for exercise 150 minutes per week    . Quit Smoking       This is a list of the screening recommended for you and due dates:  Health Maintenance  Topic Date Due  . HIV Screening  04/02/1970  . Pap Smear  09/27/2001  . Eye exam for diabetics  04/22/2017  . Complete foot exam   11/28/2017  . Flu Shot  03/22/2018  . Hemoglobin A1C  05/31/2018  . Mammogram  09/30/2018  . Pneumococcal vaccine (2) 08/15/2019  . Tetanus Vaccine  07/24/2021  . Colon Cancer Screening  03/19/2022  .  Hepatitis C: One time screening is recommended by Center for Disease Control  (CDC) for  adults born from [birth year] through 1966.   Completed

## 2018-01-09 NOTE — Progress Notes (Signed)
Subjective:   TIMEA BREED is a 62 y.o. female who presents for Medicare Annual (Subsequent) preventive examination.  Review of Systems:  No ROS.  Medicare Wellness Visit. Additional risk factors are reflected in the social history.  Cardiac Risk Factors include: diabetes mellitus;obesity (BMI >30kg/m2);hypertension;smoking/ tobacco exposure     Objective:     Vitals: BP 138/76 (BP Location: Right Arm, Patient Position: Sitting, Cuff Size: Normal)   Pulse 98   Temp 98.3 F (36.8 C) (Oral)   Resp 15   Ht  (1.473 m)   Wt 167 lb 1.9 oz (75.8 kg)   SpO2 98%   BMI 34.93 kg/m   Body mass index is 34.93 kg/m.  Advanced Directives 01/09/2018 12/16/2016 12/08/2016 03/05/2015  Does Patient Have a Medical Advance Directive? No No No No  Would patient like information on creating a medical advance directive? No - Patient declined Yes (MAU/Ambulatory/Procedural Areas - Information given) - -    Tobacco Social History   Tobacco Use  Smoking Status Current Every Day Smoker  . Packs/day: 1.00  . Types: Cigarettes  Smokeless Tobacco Never Used  Tobacco Comment   Trying to smoke a half pack per day     Ready to quit: No Counseling given: Yes Comment: Trying to smoke a half pack per day   Clinical Intake:  Pre-visit preparation completed: Yes  Pain : No/denies pain     Diabetes: Yes(Followed by pcp)  How often do you need to have someone help you when you read instructions, pamphlets, or other written materials from your doctor or pharmacy?: 1 - Never  Interpreter Needed?: No     Past Medical History:  Diagnosis Date  . 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 Chronic kidney disease    renal stent  . COPD (chronic obstructive pulmonary disease) (HCC)   . History of nephrolithiasis   . HOH (hard of hearing)   . Hyperlipidemia   . Hypertension   . Interstitial cystitis   . Meniere disease   . Peripheral arterial disease [facility]) Feb  2011   by renal artery duplex  . Tobacco abuse    one pack daily   Past Surgical History:  Procedure Laterality Date  . ABDOMINAL HYSTERECTOMY    . arthroscopy     left knee , remote  . BREAST EXCISIONAL BIOPSY Left 1980's   NEG  . BREAST SURGERY     left breast cyst, benign  . CARPAL TUNNEL RELEASE     Dr. Hyacinth Meeker,  right hand   . CATARACT EXTRACTION W/PHACO Right 03/05/2015   Procedure: CATARACT EXTRACTION PHACO AND INTRAOCULAR LENS PLACEMENT (IOC);  Surgeon: Lia Hopping, MD;  Location: [facility] ORS;  Service: Ophthalmology;  Laterality: Right;  [place]: 00:52.3 AP%: 15.2 CDE: 7.96  . KNEE ARTHROSCOPY Left   . TOTAL ABDOMINAL HYSTERECTOMY W/ BILATERAL SALPINGOOPHORECTOMY  1992   due to uterine tumor, and endometriosis    Family History  Problem Relation Age of Onset  . Coronary artery disease Mother   . COPD Mother   . Heart disease Mother   . Breast cancer Maternal Grandmother    Social History   Socioeconomic History  . Marital status: Single    Spouse name: Not on file  . Number of children: Not on file  . Years of education: Not on file  . Highest education level: Not on file  Occupational History  . Not on file  Social Needs  . Physicist, medical  strain: Not on file  . Food insecurity:    Worry: Never true    Inability: Never true  . Transportation needs:    Medical: Not on file    Non-medical: Not on file  Tobacco Use  . Smoking status: Current Every Day Smoker    Packs/day: 1.00    Types: Cigarettes  . Smokeless tobacco: Never Used  . Tobacco comment: Trying to smoke a half pack per day  Substance and Sexual Activity  . Alcohol use: No    Alcohol/week: 0.0 oz  . Drug use: No  . Sexual activity: Not Currently  Lifestyle  . Physical activity:    Days per week: Not on file    Minutes per session: Not on file  . Stress: Not on file  Relationships  . Social connections:    Talks on phone: Not on file    Gets together: Not on file    Attends religious  service: Not on file    Active member of club or organization: Not on file    Attends meetings of clubs or organizations: Not on file    Relationship status: Not on file  Other Topics Concern  . Not on file  Social History Narrative  . Not on file    Outpatient Encounter Medications as of 01/09/2018  Medication Sig  . albuterol (PROVENTIL) (2.5 MG/3ML) 0.083% nebulizer solution Take 3 mLs (2.5 mg total) by nebulization every 8 (eight) hours as needed for wheezing or shortness of breath.  . Albuterol Sulfate 108 (90 Base) MCG/ACT AEPB Inhale 1 puff into the lungs every 8 (eight) hours as needed. For wheezing  . amitriptyline (ELAVIL) 25 MG tablet Take 1 tablet (25 mg total) by mouth at bedtime.  [name] amLODipine (NORVASC) 2.5 MG tablet Take 1 tablet (2.5 mg total) by mouth daily.  [name] aspirin EC 81 MG tablet Take 1 tablet (81 mg total) by mouth daily.  [name] atorvastatin (LIPITOR) 40 MG tablet TAKE 1 TABLET BY MOUTH  DAILY  . buPROPion (WELLBUTRIN SR) 150 MG 12 hr tablet TAKE 1 TABLET BY MOUTH TWO  TIMES DAILY  . Cholecalciferol (VITAMIN D3) 1000 UNITS CAPS Take by mouth.    . ergocalciferol (DRISDOL) 50000 UNITS capsule Take 1 capsule (50,000 Units total) by mouth once a week.  . losartan-hydrochlorothiazide (HYZAAR) 100-25 MG tablet TAKE 1 TABLET BY MOUTH  DAILY  . metoprolol succinate (TOPROL-XL) 50 MG 24 hr tablet TAKE 1 TABLET BY MOUTH  DAILY  . nicotine (NICODERM CQ - DOSED IN MG/24 HOURS) 21 mg/24hr patch Place 1 patch (21 mg total) onto the skin daily.  [name] PARoxetine (PAXIL) 10 MG tablet TAKE 1 TABLET BY MOUTH  EVERY MORNING  . pentosan polysulfate (ELMIRON) 100 MG capsule Take 1 capsule (100 mg total) by mouth 3 (three) times daily.  . potassium chloride SA (K-DUR,KLOR-CON) 20 MEQ tablet TAKE 1 TABLET BY MOUTH  DAILY  . vitamin E 100 UNIT capsule Take 100 Units by mouth daily.   Facility-Administered Encounter Medications as of 01/09/2018  Medication  . betamethasone acetate-betamethasone  sodium phosphate (CELESTONE) injection 3 mg  . ipratropium-albuterol (DUONEB) 0.5-2.5 (3) MG/3ML nebulizer solution 3 mL    Activities of Daily Living In your present state of health, do you have any difficulty performing the following activities: 01/09/2018  Hearing? Y  Vision? N  Difficulty concentrating or making decisions? N  Walking or climbing stairs? Y  Dressing or bathing? N  Doing errands, shopping? Y  Comment She does  not drive  Preparing Food and eating ? N  Using the Toilet? N  In the past six months, have you accidently leaked urine? N  Do you have problems with loss of bowel control? N  Managing your Medications? N  Managing your Finances? N  Housekeeping or managing your Housekeeping? N  Some recent data might be hidden    Patient Care Team: Sherlene Shams, MD as PCP - General (Internal Medicine)    Assessment:   This is a routine wellness examination for [place].  The goal of the wellness visit is to assist the patient how to close the gaps in care and create a preventative care plan for the patient.   The roster of all physicians providing medical care to patient is listed in the Snapshot section of the chart.  Osteoporosis risk reviewed.    Safety issues reviewed; Smoke and carbon monoxide detectors in the home. No firearms in the home. Wears seatbelts when driving or riding with others. No violence in the home.  They do not have excessive sun exposure.  Discussed the need for sun protection: hats, long sleeves and the use of sunscreen if there is significant sun exposure.  Patient is alert, normal appearance, oriented to person/place/and time.  Correctly identified the president of the [place] and recalls of 2/3 words. Can read correct time from watch face. Displays appropriate judgement.  No new identified risk were noted.  No failures at ADL's or IADL's.  Ambulates with cane.  BMI- discussed the importance of a healthy diet, water intake and the benefits  of aerobic exercise. Educational material provided.   24 hour diet recall: Low carb foods  Sleep patterns- Sleeps through the night without issues.   Fasting lab complete today.   Patient Concerns: None at this time. Follow up with PCP as needed.  Exercise Activities and Dietary recommendations Current Exercise Habits: The patient does not participate in regular exercise at present  Goals    . DIET - INCREASE WATER INTAKE    . DIET - REDUCE SUGAR INTAKE    . Increase physical activity     Walk for exercise 150 minutes per week    . Quit Smoking       Fall Risk Fall Risk  01/09/2018 12/16/2016 09/19/2016 07/19/2016  Falls in the past year? Yes Yes No Yes  Number falls in past yr: 1 1 - 2 or more  Injury with Fall? No No - No  Risk Factor Category  - - - High Fall Risk  Risk for fall due to : History of fall(s) - - -  Follow up Education provided;Falls prevention discussed Falls prevention discussed;Education provided - -   Depression Screen PHQ 2/9 Scores 01/09/2018 12/16/2016 09/19/2016 07/19/2016  PHQ - 2 Score 0 0 0 0  PHQ- 9 Score - 0 - -     Cognitive Function MMSE - Mini Mental State Exam 12/16/2016  Orientation to time 5  Orientation to Place 5  Registration 3  Attention/ Calculation 5  Recall 3  Language- name 2 objects 2  Language- repeat 1  Language- follow 3 step command 3  Language- read & follow direction 1  Write a sentence 1  Copy design 1  Total score 30     6CIT Screen 01/09/2018  What Year? 0 points  What month? 0 points  What time? 0 points  Count back from 20 0 points  Months in reverse 0 points  Repeat phrase 0 points  Total  Score 0    Immunization History  Administered Date(s) Administered  . Influenza Split 07/25/2011, 09/27/2012  . Influenza,inj,Quad PF,6+ Mos 05/29/2013, 08/14/2014, 11/06/2015, 09/19/2016  . Pneumococcal Conjugate-13 05/29/2013  . Pneumococcal Polysaccharide-23 08/14/2014  . Tdap 07/25/2011  . Zoster  08/20/2014   Screening Tests Health Maintenance  Topic Date Due  . HIV Screening  04/02/1970  . PAP SMEAR  09/27/2001  . OPHTHALMOLOGY EXAM  04/22/2017  . FOOT EXAM  11/28/2017  . INFLUENZA VACCINE  03/22/2018  . HEMOGLOBIN A1C  05/31/2018  . MAMMOGRAM  09/30/2018  . PNEUMOCOCCAL POLYSACCHARIDE VACCINE (2) 08/15/2019  . TETANUS/TDAP  07/24/2021  . COLONOSCOPY  03/19/2022  . Hepatitis C Screening  Completed      Plan:    End of life planning; Advanced aging; Advanced directives discussed.  No HCPOA/Living Will.  Additional information declined at this time.  I have personally reviewed and noted the following in the patient's chart:   . Medical and social history . Use of alcohol, tobacco or illicit drugs  . Current medications and supplements . Functional ability and status . Nutritional status . Physical activity . Advanced directives . List of other physicians . Hospitalizations, surgeries, and ER visits in previous 12 months . Vitals . Screenings to include cognitive, depression, and falls . Referrals and appointments  In addition, I have reviewed and discussed with patient certain preventive protocols, quality metrics, and best practice recommendations. A written personalized care plan for preventive services as well as general preventive health recommendations were provided to patient.     Ashok Pall, LPN  11/28/8117

## 2018-01-23 ENCOUNTER — Other Ambulatory Visit: Payer: Self-pay | Admitting: Internal Medicine

## 2018-01-23 DIAGNOSIS — I10 Essential (primary) hypertension: Secondary | ICD-10-CM

## 2018-01-23 NOTE — Telephone Encounter (Signed)
Copied from CRM [phone]. Topic: Quick Communication - See Telephone Encounter >> Jan 23, 2018 11:13 AM Diana EvesHoyt, Maryann B wrote: CRM for notification. See Telephone encounter for: 01/23/18.  Pt is requesting refills on all her medications and [facility] [place] - [address]

## 2018-01-24 MED ORDER — PAROXETINE HCL 10 MG PO TABS: 10.0000 mg | ORAL_TABLET | Freq: Every morning | ORAL | 1 refills | Status: DC

## 2018-01-24 MED ORDER — PAROXETINE HCL 10 MG PO TABS: 10.0000 mg | ORAL_TABLET | Freq: Every morning | ORAL | 0 refills | Status: DC

## 2018-01-24 MED ORDER — ATORVASTATIN CALCIUM 40 MG PO TABS: 40.0000 mg | ORAL_TABLET | Freq: Every day | ORAL | 1 refills | Status: DC

## 2018-01-24 MED ORDER — LOSARTAN POTASSIUM-HCTZ 100-25 MG PO TABS: 1.0000 | ORAL_TABLET | Freq: Every day | ORAL | 1 refills | Status: DC

## 2018-01-24 MED ORDER — METOPROLOL SUCCINATE ER 50 MG PO TB24: 50.0000 mg | ORAL_TABLET | Freq: Every day | ORAL | 1 refills | Status: DC

## 2018-01-24 NOTE — Telephone Encounter (Signed)
Amlodipine 2.5 mg refill Last OV:11/29/17 Last refill:11/28/16 90 tab/3 refills (expired prescription) ZOX:WRUEAPCP:[place] [facility]: [facility] [place] - [id number] [phone] (Phone) [phone] (Fax)

## 2018-01-24 NOTE — Addendum Note (Signed)
Addended by: Wilford CornerOVINGTON, Willodean Leven W on: 01/24/2018 03:07 PM   Modules accepted: Orders

## 2018-01-26 MED ORDER — AMLODIPINE BESYLATE 2.5 MG PO TABS: 2.5000 mg | ORAL_TABLET | Freq: Every day | ORAL | 3 refills | Status: DC

## 2018-01-30 ENCOUNTER — Ambulatory Visit (INDEPENDENT_AMBULATORY_CARE_PROVIDER_SITE_OTHER): Payer: Medicare HMO

## 2018-01-30 ENCOUNTER — Encounter: Payer: Self-pay | Admitting: Podiatry

## 2018-01-30 ENCOUNTER — Ambulatory Visit (INDEPENDENT_AMBULATORY_CARE_PROVIDER_SITE_OTHER): Payer: Medicare HMO | Admitting: Podiatry

## 2018-01-30 DIAGNOSIS — M2042 Other hammer toe(s) (acquired), left foot: Secondary | ICD-10-CM

## 2018-01-30 DIAGNOSIS — Z9889 Other specified postprocedural states: Secondary | ICD-10-CM

## 2018-02-02 ENCOUNTER — Ambulatory Visit: Payer: Self-pay | Admitting: *Deleted

## 2018-02-02 DIAGNOSIS — R0989 Other specified symptoms and signs involving the circulatory and respiratory systems: Secondary | ICD-10-CM | POA: Diagnosis not present

## 2018-02-02 DIAGNOSIS — J40 Bronchitis, not specified as acute or chronic: Secondary | ICD-10-CM | POA: Diagnosis not present

## 2018-02-02 NOTE — Telephone Encounter (Signed)
Pt reports sore throat, sneezing,cough, onset Tuesday. Cough productive for clear, "Whitish" mucous.  States also had "GI virus" Tuesday, since resolved. Pt states may have had fever last several days "Sweating a lot."  Audible wheezing, inspiratory and expiratory, during call. H/O COPD, does not use inhalers. Did nebulizer treatment last night , "Didn't help."  Pt directed to UC. States will follow disposition. Care advise given per protocol. Reason for Disposition . Wheezing is present  Answer Assessment - Initial Assessment Questions 1. ONSET: "When did the cough begin?"      Tuesday 2. SEVERITY: "How bad is the cough today?"      moderate 3. RESPIRATORY DISTRESS: "Describe your breathing."      Wheezing 4. FEVER: "Do you have a fever?" If so, ask: "What is your temperature, how was it measured, and when did it start?"     Unsure, sweating tues and wednesday 5. SPUTUM: "Describe the color of your sputum" (clear, white, yellow, green)     Clear, whitish 6. HEMOPTYSIS: "Are you coughing up any blood?" If so ask: "How much?" (flecks, streaks, tablespoons, etc.)     no 7. CARDIAC HISTORY: "Do you have any history of heart disease?" (e.g., heart attack, congestive heart failure)      no 8. LUNG HISTORY: "Do you have any history of lung disease?"  (e.g., pulmonary embolus, asthma, emphysema)     COPD 9. PE RISK FACTORS: "Do you have a history of blood clots?" (or: recent major surgery, recent prolonged travel, bedridden )     no 10. OTHER SYMPTOMS: "Do you have any other symptoms?" (e.g., runny nose, wheezing, chest pain)       Wheezing, inspiratory, expiratory  Protocols used: COUGH - ACUTE PRODUCTIVE-A-AH

## 2018-02-02 NOTE — Telephone Encounter (Signed)
[facility] and advised to go to urgent care.

## 2018-02-05 NOTE — Progress Notes (Signed)
   Subjective:  Patient presents today status post 5th toe arthroplasty left. DOS: 12/14/17. She states she is doing better. She reports some intermittent soreness between the 4th and 5th toes. Patient is here for further evaluation and treatment.    Past Medical History:  Diagnosis Date  . 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 Chronic kidney disease    renal stent  . COPD (chronic obstructive pulmonary disease) (HCC)   . History of nephrolithiasis   . HOH (hard of hearing)   . Hyperlipidemia   . Hypertension   . Interstitial cystitis   . Meniere disease   . Peripheral arterial disease [facility](HCC) Feb 2011   by renal artery duplex  . Tobacco abuse    one pack daily      Objective/Physical Exam Neurovascular status intact.  Skin incisions appear to be well coapted. No sign of infectious process noted. No dehiscence. No active bleeding noted. Moderate edema noted to the surgical extremity. Hyperkeratotic tissue noted to the 4th interdigital webspace of the left foot.    Radiographic Exam:  Osteotomies sites appear to be stable with routine healing.   Assessment: 1. s/p 5th toe arthroplasty left. DOS: 12/14/17 2. Heloma molle left foot    Plan of Care:  1. Patient was evaluated.  2. Continue wearing good shoe gear. 3. Silicone toe spacers provided.  4. Return to clinic as needed.      Felecia ShellingBrent M. Jamaul Heist, DPM [facility]  Dr. Felecia ShellingBrent M. Zev Blue, DPM    [address]                                        [place], [place] [address]                Office [phone]  Fax [phone]

## 2018-03-12 ENCOUNTER — Ambulatory Visit (INDEPENDENT_AMBULATORY_CARE_PROVIDER_SITE_OTHER): Payer: Medicare HMO

## 2018-03-12 ENCOUNTER — Ambulatory Visit (INDEPENDENT_AMBULATORY_CARE_PROVIDER_SITE_OTHER): Payer: Medicare HMO | Admitting: Internal Medicine

## 2018-03-12 ENCOUNTER — Encounter: Payer: Self-pay | Admitting: Internal Medicine

## 2018-03-12 VITALS — BP 136/72 | HR 77 | Temp 97.8°F | Resp 17 | Ht <= 58 in | Wt 165.0 lb

## 2018-03-12 DIAGNOSIS — R059 Cough, unspecified: Secondary | ICD-10-CM

## 2018-03-12 DIAGNOSIS — R05 Cough: Secondary | ICD-10-CM

## 2018-03-12 DIAGNOSIS — Z79899 Other long term (current) drug therapy: Secondary | ICD-10-CM | POA: Diagnosis not present

## 2018-03-12 DIAGNOSIS — E669 Obesity, unspecified: Secondary | ICD-10-CM

## 2018-03-12 DIAGNOSIS — E1151 Type 2 diabetes mellitus with diabetic peripheral angiopathy without gangrene: Secondary | ICD-10-CM

## 2018-03-12 DIAGNOSIS — I150 Renovascular hypertension: Secondary | ICD-10-CM | POA: Diagnosis not present

## 2018-03-12 DIAGNOSIS — Z716 Tobacco abuse counseling: Secondary | ICD-10-CM

## 2018-03-12 MED ORDER — PREDNISONE 10 MG PO TABS: ORAL_TABLET | ORAL | 0 refills | Status: DC

## 2018-03-12 NOTE — Patient Instructions (Addendum)
I am proud of you!!!!!!!   For your hypertension:  Continue amlodipine , metoprolol  and losartan for your Blood pressurre  For your diabetes management ,  Stick   to Low GI foods as much as possible .  Try to reduce your Dr . Alcus DadPeppers  To 1 daily  Continue atorvastatin to keep your arteries open   For your cold/allergy:  Start a daily antihistamine    You can use Benadryl at night for drainage  but you should consider adding one of these newer second generation antihistamines that are longer acting, non sedating and  available OTC:  Generic  Zyrtec, which is cetirizine.    generic Allegra , available generically as fexofenadine ; comes in 60 mg and 180 mg once daily strengths.    Generic Claritin :  also available as loratidine .    Prednisone taper  Start tomorrow I will add an antibiotic once I see your chest x ray     For your muscle cramps at night:  Try a teaspoon of mustard before bedtime

## 2018-03-12 NOTE — Progress Notes (Signed)
Subjective:  Patient ID: Patricia Fields, female    DOB: July 12, 1956  Age: 62 y.o. MRN: [id number]  CC: The primary encounter diagnosis was Cough. Diagnoses of Long-term use of high-risk medication, DM (diabetes mellitus), type 2 with peripheral vascular complications (HCC), Tobacco abuse counseling, Renovascular hypertension, and Obesity (BMI 30.0-34.9) were also pertinent to this visit.  HPI Patricia Fields presents for 3 month follow up on diabetes, hypertension, hyperlipidemia and tobacco abuse..  Patient has been experiencing cough,  Nonproductive,  Accompanied by a history of subjective fever a few days ago,  None since, along with rhinitis and rhnirrhea  for the last 3-4 days. Does not feel sick.  atributes symptoms  To environmental allelrgies  "my lungs feel dry."  No recent sick contacts.  Still smoking but has reduced her use to 1/2 pack daily and has only had 1 cigarette today.  HTN:  Taking 3 meds for control of BP.  Wants to reduce medication list.  Recent workup for RAS and PAD in April reviewed with patient:  renal artery duplex   < 60% stenosis of  left renal artery   Right side  Ok Right carotid ICA 40-59% stenosis    T2DM:  patient is  following a low glycemic index diet except for use of soft drinks.   Diet reviewed ;  Tomasa BlaseBacon and eggs for breakfast, NO toast.  But she  Drinks 32 to 48 ounces of Dr Reino KentPepper daily .  Lunch IS a sandwhich or just fresh vegetables.  Dinner : usually chicken and vegetables,  Not breaded.   Does not check blood sugars yet, has no meter .   Patient is  Trying to walk about 3 times per week weather permitting  and intentionally trying to lose weight.  Down 5 lbs since last visit. .  Patient has had an eye exam in the last 12 months and checks feet regularly for signs of infection.  Patient does not walk barefoot outside,  And denies any numbness tingling or burning in feet. Patient is up to date on all recommended vaccinations  Outpatient Medications Prior  to Visit  Medication Sig Dispense Refill  . albuterol (PROVENTIL) (2.5 MG/3ML) 0.083% nebulizer solution Take 3 mLs (2.5 mg total) by nebulization every 8 (eight) hours as needed for wheezing or shortness of breath. 150 mL 1  . Albuterol Sulfate 108 (90 Base) MCG/ACT AEPB Inhale 1 puff into the lungs every 8 (eight) hours as needed. For wheezing 3 each 0  . amLODipine (NORVASC) 2.5 MG tablet Take 1 tablet (2.5 mg total) by mouth daily. 90 tablet 3  . aspirin EC 81 MG tablet Take 1 tablet (81 mg total) by mouth daily. 90 tablet 2  . atorvastatin (LIPITOR) 40 MG tablet Take 1 tablet (40 mg total) by mouth daily. 90 tablet 1  . buPROPion (WELLBUTRIN SR) 150 MG 12 hr tablet TAKE 1 TABLET BY MOUTH TWO  TIMES DAILY 180 tablet 1  . Cholecalciferol (VITAMIN D3) 1000 UNITS CAPS Take by mouth.      . losartan-hydrochlorothiazide (HYZAAR) 100-25 MG tablet Take 1 tablet by mouth daily. 90 tablet 1  . metoprolol succinate (TOPROL-XL) 50 MG 24 hr tablet Take 1 tablet (50 mg total) by mouth daily. Take with or immediately following a meal. 90 tablet 1  . PARoxetine (PAXIL) 10 MG tablet Take 1 tablet (10 mg total) by mouth every morning. 30 tablet 0  . pentosan polysulfate (ELMIRON) 100 MG capsule Take 1 capsule (100  mg total) by mouth 3 (three) times daily. 90 capsule 1  . vitamin E 100 UNIT capsule Take 100 Units by mouth daily.    [name] amitriptyline (ELAVIL) 25 MG tablet Take 1 tablet (25 mg total) by mouth at bedtime. (Patient not taking: Reported on 03/12/2018) 30 tablet 1  . ergocalciferol (DRISDOL) 50000 UNITS capsule Take 1 capsule (50,000 Units total) by mouth once a week. (Patient not taking: Reported on 03/12/2018) 12 capsule 0  . meloxicam (MOBIC) 15 MG tablet meloxicam 15 mg tablet  Take 1 tablet every day by oral route.    . nicotine (NICODERM CQ - DOSED IN MG/24 HOURS) 21 mg/24hr patch Place 1 patch (21 mg total) onto the skin daily. (Patient not taking: Reported on 03/12/2018) 28 patch 3  . potassium  chloride SA (K-DUR,KLOR-CON) 20 MEQ tablet TAKE 1 TABLET BY MOUTH  DAILY (Patient not taking: Reported on 03/12/2018) 90 tablet 0   Facility-Administered Medications Prior to Visit  Medication Dose Route Frequency Provider Last Rate Last Dose  . betamethasone acetate-betamethasone sodium phosphate (CELESTONE) injection 3 mg  3 mg Intramuscular Once Evans, Brent M, DPM      . ipratropium-albuterol (DUONEB) 0.5-2.5 (3) MG/3ML nebulizer solution 3 mL  3 mL Nebulization Q6H Allegra Grana, FNP        Review of Systems;  Patient denies headache, fevers, malaise, unintentional weight loss, skin rash, eye pain, sinus congestion and sinus pain, sore throat, dysphagia,  hemoptysis ,dyspnea, wheezing, chest pain, palpitations, orthopnea, edema, abdominal pain, nausea, melena, diarrhea, constipation, flank pain, dysuria, hematuria, urinary  Frequency, nocturia, numbness, tingling, seizures,  Focal weakness, Loss of consciousness,  Tremor, insomnia, depression, anxiety, and suicidal ideation.      Objective:  BP 136/72 (BP Location: Left Arm, Patient Position: Sitting, Cuff Size: Normal)   Pulse 77   Temp 97.8 F (36.6 C) (Oral)   Resp 17   Ht 4\' 10"  (1.473 m)   Wt 165 lb (74.8 kg)   SpO2 95%   BMI 34.49 kg/m   BP Readings from Last 3 Encounters:  03/12/18 136/72  01/09/18 138/76  11/29/17 138/76    Wt Readings from Last 3 Encounters:  03/12/18 165 lb (74.8 kg)  01/09/18 167 lb 1.9 oz (75.8 kg)  11/29/17 173 lb (78.5 kg)    General appearance: alert, cooperative and appears stated age Ears: normal TM's and external ear canals both ears Throat: lips, mucosa, and tongue normal; teeth and gums normal Neck: no adenopathy, no carotid bruit, supple, symmetrical, trachea midline and thyroid not enlarged, symmetric, no tenderness/mass/nodules Back: symmetric, no curvature. ROM normal. No CVA tenderness. Lungs: diminished breath sounds bilaterally without wheezing ,  ronchi or egophony.    Heart: regular rate and rhythm, S1, S2 normal, no murmur, click, rub or gallop Abdomen: soft, non-tender; bowel sounds normal; no masses,  no organomegaly Pulses: 2+ and symmetric Skin: Skin color, texture, turgor normal. No rashes or lesions Lymph nodes: Cervical, supraclavicular, and axillary nodes normal.  Lab Results  Component Value Date   HGBA1C 6.4 11/29/2017   HGBA1C 6.5 10/13/2016   HGBA1C 6.0 01/25/2012    Lab Results  Component Value Date   CREATININE 1.08 11/29/2017   CREATININE 0.90 12/16/2016   CREATININE 0.93 10/13/2016    Lab Results  Component Value Date   WBC 15.2 (H) 11/29/2017   HGB 13.2 11/29/2017   HCT 38.9 11/29/2017   PLT 279.0 11/29/2017   GLUCOSE 112 (H) 11/29/2017   CHOL 174 01/09/2018  TRIG 190.0 (H) 01/09/2018   HDL 42.80 01/09/2018   LDLDIRECT 141.0 10/13/2016   LDLCALC 93 01/09/2018   ALT 16 11/29/2017   AST 13 11/29/2017   NA 140 11/29/2017   K 3.4 (L) 11/29/2017   CL 106 11/29/2017   CREATININE 1.08 11/29/2017   BUN 10 11/29/2017   CO2 28 11/29/2017   TSH 0.88 11/29/2017   HGBA1C 6.4 11/29/2017   MICROALBUR 3.2 (H) 11/29/2017    Mm Screening Breast Tomo Bilateral  Result Date: 09/30/2016 CLINICAL DATA:  Screening. EXAM: 2D DIGITAL SCREENING BILATERAL MAMMOGRAM WITH CAD AND ADJUNCT TOMO COMPARISON:  Previous exam(s). ACR Breast Density Category c: The breast tissue is heterogeneously dense, which may obscure small masses. FINDINGS: There are no findings suspicious for malignancy. Images were processed with CAD. IMPRESSION: No mammographic evidence of malignancy. A result letter of this screening mammogram will be mailed directly to the patient. RECOMMENDATION: Screening mammogram in one year. (Code:SM-B-01Y) BI-RADS CATEGORY  1: Negative. Electronically Signed   By: Britta Mccreedy M.D.   On: 09/30/2016 18:03    Assessment & Plan:   Problem List Items Addressed This Visit    Tobacco abuse counseling     Still smoking  But has  reudced daily use to 5 or less..  3 minutes was spent in face to face time discussing patient's current tobacco abuse,  Prior attempts to quit, the current and future hazards to patient's  health,  And assessing and discussing patient's  level of motivation to quit. Patient was encouraged to consider pharmacotherapy.       Renovascular hypertension    Finally under better control on 3 agents  Has had  evaluation of renal arteries,   <60% stenosis of left renal artery seen April 2019 dopplers  lastcr Lab Results  Component Value Date   NA 140 11/29/2017   K 3.4 (L) 11/29/2017   CL 106 11/29/2017   CO2 28 11/29/2017    .[name] Lab Results  Component Value Date   CREATININE 1.08 11/29/2017         Obesity (BMI 30.0-34.9)    I have congratulated her in reduction of  weight and encouraged  Continued weight loss with goal of 10% of body weigh over the next 6 months using a low glycemic index diet and regular exercise a minimum of 5 days per week.        Long-term use of high-risk medication   DM (diabetes mellitus), type 2 with peripheral vascular complications (HCC)    Diagnosed in April 2018.  Well controlled by diet despite lack of follow up or dietary changes. Still drinking 3 carbonated sodas daily. .  Smoking cessation strongly  advised. Advised to start taking a baby aspirin.  Continue lipitor, losartan.    Lab Results  Component Value Date   HGBA1C 6.4 11/29/2017   Lab Results  Component Value Date   ALT 16 11/29/2017   AST 13 11/29/2017   ALKPHOS 89 11/29/2017   BILITOT 0.3 11/29/2017   Lab Results  Component Value Date   CREATININE 1.08 11/29/2017   Lab Results  Component Value Date   MICROALBUR 3.2 (H) 11/29/2017         Relevant Orders   Comprehensive metabolic panel   Hemoglobin A1c   Cough - Primary    Not wheezing on exam.  Chest x ray clear.  Prednisone taper given.      Relevant Medications   predniSONE (DELTASONE) 10 MG tablet   Other Relevant  Orders   DG Chest 2 View (Completed)   CBC with Differential/Platelet    A total of 25 minutes of face to face time was spent with patient more than half of which was spent in counselling about the above mentioned conditions  and coordination of care   I have discontinued Loriene R. Seelig's ergocalciferol, nicotine, potassium chloride SA, and meloxicam. I am also having her start on predniSONE. Additionally, I am having her maintain her Vitamin D3, vitamin E, pentosan polysulfate, amitriptyline, Albuterol Sulfate, albuterol, buPROPion, aspirin EC, atorvastatin, losartan-hydrochlorothiazide, metoprolol succinate, PARoxetine, and amLODipine. We will continue to administer ipratropium-albuterol and betamethasone acetate-betamethasone sodium phosphate.  Meds ordered this encounter  Medications  . predniSONE (DELTASONE) 10 MG tablet    Sig: 6 tablets on Day 1 , then reduce by 1 tablet daily until gone    Dispense:  21 tablet    Refill:  0    Medications Discontinued During This Encounter  Medication Reason  . ergocalciferol (DRISDOL) 50000 UNITS capsule Completed Course  . meloxicam (MOBIC) 15 MG tablet Patient has not taken in last 30 days  . nicotine (NICODERM CQ - DOSED IN MG/24 HOURS) 21 mg/24hr patch Patient has not taken in last 30 days  . potassium chloride SA (K-DUR,KLOR-CON) 20 MEQ tablet Error    Follow-up: Return in about 6 months (around 09/12/2018) for follow up diabetes.   Sherlene Shams, MD

## 2018-03-13 DIAGNOSIS — R05 Cough: Secondary | ICD-10-CM | POA: Insufficient documentation

## 2018-03-13 DIAGNOSIS — R059 Cough, unspecified: Secondary | ICD-10-CM | POA: Insufficient documentation

## 2018-03-13 LAB — CBC WITH DIFFERENTIAL/PLATELET
BASOS PCT: 0.5 % (ref 0.0–3.0)
Basophils Absolute: 0 10*3/uL (ref 0.0–0.1)
EOS ABS: 0.7 10*3/uL (ref 0.0–0.7)
EOS PCT: 9.1 % — AB (ref 0.0–5.0)
HCT: 39.5 % (ref 36.0–46.0)
HEMOGLOBIN: 13.5 g/dL (ref 12.0–15.0)
LYMPHS ABS: 2 10*3/uL (ref 0.7–4.0)
Lymphocytes Relative: 24.6 % (ref 12.0–46.0)
MCHC: 34.3 g/dL (ref 30.0–36.0)
MCV: 93.9 fl (ref 78.0–100.0)
MONO ABS: 0.7 10*3/uL (ref 0.1–1.0)
Monocytes Relative: 8.7 % (ref 3.0–12.0)
NEUTROS PCT: 57.1 % (ref 43.0–77.0)
Neutro Abs: 4.7 10*3/uL (ref 1.4–7.7)
PLATELETS: 240 10*3/uL (ref 150.0–400.0)
RBC: 4.21 Mil/uL (ref 3.87–5.11)
RDW: 14.2 % (ref 11.5–15.5)
WBC: 8.1 10*3/uL (ref 4.0–10.5)

## 2018-03-13 LAB — COMPREHENSIVE METABOLIC PANEL
ALK PHOS: 109 U/L (ref 39–117)
ALT: 18 U/L (ref 0–35)
AST: 17 U/L (ref 0–37)
Albumin: 3.9 g/dL (ref 3.5–5.2)
BUN: 17 mg/dL (ref 6–23)
CHLORIDE: 102 meq/L (ref 96–112)
CO2: 28 mEq/L (ref 19–32)
CREATININE: 1.36 mg/dL — AB (ref 0.40–1.20)
Calcium: 9.5 mg/dL (ref 8.4–10.5)
GFR: 41.75 mL/min — ABNORMAL LOW (ref >60.00)
GLUCOSE: 131 mg/dL — AB (ref 70–99)
Potassium: 3.4 mEq/L — ABNORMAL LOW (ref 3.5–5.1)
SODIUM: 139 meq/L (ref 135–145)
TOTAL PROTEIN: 7.3 g/dL (ref 6.0–8.3)
Total Bilirubin: 0.5 mg/dL (ref 0.2–1.2)

## 2018-03-13 LAB — HEMOGLOBIN A1C: Hgb A1c MFr Bld: 6.2 % (ref 4.6–6.5)

## 2018-03-13 NOTE — Assessment & Plan Note (Signed)
  Still smoking  But has reudced daily use to 5 or less..  3 minutes was spent in face to face time discussing patient's current tobacco abuse,  Prior attempts to quit, the current and future hazards to patient's  health,  And assessing and discussing patient's  level of motivation to quit. Patient was encouraged to consider pharmacotherapy.

## 2018-03-13 NOTE — Assessment & Plan Note (Signed)
Finally under better control on 3 agents  Has had  evaluation of renal arteries,   <60% stenosis of left renal artery seen April 2019 dopplers  lastcr Lab Results  Component Value Date   NA 140 11/29/2017   K 3.4 (L) 11/29/2017   CL 106 11/29/2017   CO2 28 11/29/2017    .[name]. Lab Results  Component Value Date   CREATININE 1.08 11/29/2017

## 2018-03-13 NOTE — Assessment & Plan Note (Addendum)
Diagnosed in April 2018.  Well controlled by diet despite lack of follow up or dietary changes. Still drinking 3 carbonated sodas daily. .  Smoking cessation strongly  advised. Advised to start taking a baby aspirin.  Continue lipitor, losartan.    Lab Results  Component Value Date   HGBA1C 6.4 11/29/2017   Lab Results  Component Value Date   ALT 16 11/29/2017   AST 13 11/29/2017   ALKPHOS 89 11/29/2017   BILITOT 0.3 11/29/2017   Lab Results  Component Value Date   CREATININE 1.08 11/29/2017   Lab Results  Component Value Date   MICROALBUR 3.2 (H) 11/29/2017

## 2018-03-13 NOTE — Assessment & Plan Note (Signed)
Not wheezing on exam.  Chest x ray clear.  Prednisone taper given.

## 2018-03-13 NOTE — Assessment & Plan Note (Signed)
I have congratulated her in reduction of weight  and encouraged  Continued weight loss with goal of 10% of body weigh over the next 6 months using a low glycemic index diet and regular exercise a minimum of 5 days per week.   

## 2018-03-14 ENCOUNTER — Other Ambulatory Visit: Payer: Self-pay | Admitting: Internal Medicine

## 2018-03-14 ENCOUNTER — Telehealth: Payer: Self-pay | Admitting: Internal Medicine

## 2018-03-14 DIAGNOSIS — R944 Abnormal results of kidney function studies: Secondary | ICD-10-CM

## 2018-03-14 MED ORDER — POTASSIUM CHLORIDE CRYS ER 20 MEQ PO TBCR
20.0000 meq | EXTENDED_RELEASE_TABLET | Freq: Every day | ORAL | 0 refills | Status: DC
Start: 2018-03-14 — End: 2020-01-17

## 2018-03-14 MED ORDER — LOSARTAN POTASSIUM 100 MG PO TABS: 100.0000 mg | ORAL_TABLET | Freq: Every day | ORAL | 0 refills | Status: DC

## 2018-03-14 MED ORDER — LOSARTAN POTASSIUM 100 MG PO TABS: 100.0000 mg | ORAL_TABLET | Freq: Every day | ORAL | 3 refills | Status: DC

## 2018-03-14 NOTE — Assessment & Plan Note (Signed)
and hypokalemia.  Stopping hctz .  Potassium supplement ordered.

## 2018-03-14 NOTE — Telephone Encounter (Signed)
Copied from CRM [id number]. Topic: Quick Communication - See Telephone Encounter >> Mar 14, 2018  8:51 AM Arlyss Gandyichardson, Halil Rentz N, NT wrote: CRM for notification. See Telephone encounter for: 03/14/18. Pt calling and states the pharmacy told her to not take a aspirin with the predniSONE (DELTASONE) 10 MG tablet and she wants to know if she should continue to take the aspirin or to stop while on prednisone.

## 2018-03-14 NOTE — Telephone Encounter (Signed)
Called patient to see why the pharmacist told her not to take asprin with prednisone. Patient states that her son picked up the prednisone yesterday and told her the pharmacist told her not to take both but did not tell him why. She states that she took the prednisone yesterday anyway and doesn't feel any different only thing is that she didn't sleep well. She just wants to see what she needs to do.

## 2018-03-14 NOTE — Telephone Encounter (Signed)
She should continue the aspirin . There is no compelling reason to stop it unless she has a history of gastritis

## 2018-03-14 NOTE — Telephone Encounter (Signed)
Pt was prescribed prednisone at her visit the other day and when she picked up the rx the pharmacist told her she should not take aspirin while she is taking the prednisone. Pt is wanting to know if she should hold the aspirin while on the prednisone or continue to take it?

## 2018-03-15 NOTE — Telephone Encounter (Signed)
Attempted to call pt. Someone answered the phone and then hung up. Tried calling again and couldn't get an answer and no voicemail.

## 2018-03-23 ENCOUNTER — Telehealth: Payer: Self-pay

## 2018-03-23 NOTE — Telephone Encounter (Signed)
Copied from CRM [id number]. Topic: Quick Communication - Other Results >> Mar 22, 2018  5:14 PM Alexander BergeronBarksdale, Patricia B wrote: Pt called to get image results of chest; contact to advise

## 2018-03-27 NOTE — Telephone Encounter (Signed)
Yes ok to send rx for glucomteter to her pharmacy   PLEASE ASK FRONT OFFICE TO DEACTIVATE HER MYCHART ACCOUNT, R INSTRUT HER HOW TO DO IT.  . I HAVE TOO MANY PATIENTS TO RECALL WHO IS NOT USING AN ACTIVATED MYCHART ACCOUNT AND WHO IS NOT.

## 2018-03-27 NOTE — Telephone Encounter (Signed)
Spoke with pt to let her know that a mychart message was sent to her in regards to her lab results and chest xray results. Pt stated that she does not use her mychart because she can not access it. So I read the pt the mychart message that was sent in regards to lab results and medication changes. Pt stated that she would start the plain losartan and potassium today. Pt also stated that it was discussed at her last office visit that she needed a blood glucose monitor to start checking her sugars. Is it okay to send one in to her pharmacy?

## 2018-07-12 ENCOUNTER — Ambulatory Visit (INDEPENDENT_AMBULATORY_CARE_PROVIDER_SITE_OTHER): Payer: Medicare HMO | Admitting: Internal Medicine

## 2018-07-12 ENCOUNTER — Encounter: Payer: Self-pay | Admitting: Internal Medicine

## 2018-07-12 VITALS — BP 154/98 | HR 88 | Temp 97.9°F | Resp 15 | Ht <= 58 in | Wt 171.6 lb

## 2018-07-12 DIAGNOSIS — R1012 Left upper quadrant pain: Secondary | ICD-10-CM

## 2018-07-12 DIAGNOSIS — I150 Renovascular hypertension: Secondary | ICD-10-CM

## 2018-07-12 DIAGNOSIS — G4452 New daily persistent headache (NDPH): Secondary | ICD-10-CM

## 2018-07-12 DIAGNOSIS — Z1231 Encounter for screening mammogram for malignant neoplasm of breast: Secondary | ICD-10-CM | POA: Diagnosis not present

## 2018-07-12 DIAGNOSIS — R296 Repeated falls: Secondary | ICD-10-CM | POA: Diagnosis not present

## 2018-07-12 DIAGNOSIS — Z8249 Family history of ischemic heart disease and other diseases of the circulatory system: Secondary | ICD-10-CM | POA: Diagnosis not present

## 2018-07-12 DIAGNOSIS — E1151 Type 2 diabetes mellitus with diabetic peripheral angiopathy without gangrene: Secondary | ICD-10-CM

## 2018-07-12 DIAGNOSIS — Z1211 Encounter for screening for malignant neoplasm of colon: Secondary | ICD-10-CM

## 2018-07-12 DIAGNOSIS — Z1239 Encounter for other screening for malignant neoplasm of breast: Secondary | ICD-10-CM | POA: Diagnosis not present

## 2018-07-12 DIAGNOSIS — Z23 Encounter for immunization: Secondary | ICD-10-CM | POA: Diagnosis not present

## 2018-07-12 DIAGNOSIS — E782 Mixed hyperlipidemia: Secondary | ICD-10-CM

## 2018-07-12 LAB — COMPREHENSIVE METABOLIC PANEL
ALT: 17 U/L (ref 0–35)
AST: 14 U/L (ref 0–37)
Albumin: 4 g/dL (ref 3.5–5.2)
Alkaline Phosphatase: 126 U/L — ABNORMAL HIGH (ref 39–117)
BUN: 15 mg/dL (ref 6–23)
CHLORIDE: 107 meq/L (ref 96–112)
CO2: 28 mEq/L (ref 19–32)
Calcium: 9.3 mg/dL (ref 8.4–10.5)
Creatinine, Ser: 0.99 mg/dL (ref 0.40–1.20)
GFR: 60.16 mL/min (ref >60.00)
GLUCOSE: 116 mg/dL — AB (ref 70–99)
POTASSIUM: 3.7 meq/L (ref 3.5–5.1)
SODIUM: 143 meq/L (ref 135–145)
Total Bilirubin: 0.4 mg/dL (ref 0.2–1.2)
Total Protein: 7 g/dL (ref 6.0–8.3)

## 2018-07-12 LAB — LIPID PANEL
Cholesterol: 177 mg/dL (ref 0–200)
HDL: 41.7 mg/dL (ref >39.00)
LDL CALC: 110 mg/dL — AB (ref 0–99)
NONHDL: 135.74
Total CHOL/HDL Ratio: 4
Triglycerides: 128 mg/dL (ref 0.0–149.0)
VLDL: 25.6 mg/dL (ref 0.0–40.0)

## 2018-07-12 LAB — MICROALBUMIN / CREATININE URINE RATIO
Creatinine,U: 126.7 mg/dL
MICROALB UR: 28 mg/dL — AB (ref 0.0–1.9)
Microalb Creat Ratio: 22.1 mg/g (ref 0.0–30.0)

## 2018-07-12 LAB — HEMOGLOBIN A1C: Hgb A1c MFr Bld: 6.2 % (ref 4.6–6.5)

## 2018-07-12 NOTE — Patient Instructions (Addendum)
Your annual mammogram has been ordered.  You are encouraged (required) to call to make your appointment at [facility]  [phone]   I will initiate the order for your new Cologuard test .  Throw the old one away.   It will be delivered to your house, and you will send off a stool sample in the envelope it provides.    Go see Dr Shari Heritageichardson ASAPP  For your eye exam  Practice standing on one leg .  Do 10 attempts  Each sided 3 times daily to strengthen your  buttock muscles   MRI/MRA of your brain is being ordered

## 2018-07-12 NOTE — Progress Notes (Signed)
Subjective:  Patient ID: Patricia Fields, female    DOB: [birth year]  Age: 62 y.o. MRN: [id number]  CC: The primary encounter diagnosis was DM (diabetes mellitus), type 2 with peripheral vascular complications (HCC). Diagnoses of Encounter for screening mammogram for breast cancer, New daily persistent headache, Recurrent falls, Family history of aneurysm of blood vessel of brain, Colon cancer screening, Need for immunization against influenza, Breast cancer screening, Mixed hyperlipidemia, and Renovascular hypertension were also pertinent to this visit.  HPI Patricia Fields presents for 3 month follow up on Type 2 diabetes with PAD and Hypertension .  However, Patient has several complaints today A total of 40 minutes was spent with patient today  more than half of which was spent in counseling patient on the below listed  issues , reviewing and explaining recent labs and imaging studies done, and coordination of care. .    Tobacco abuse:  She is still smoking but limiting use to 1/2 pack daily .  Tried Chantix  In the past.  Wants to try the patches  Not checking sugars . Drinking 3 Dr Alcus Dad daily.  Eats snack foods (pretzels) 3 times per week.  Limits bread and pasta .  Potatoes 3-4 times per week doesn't like sweet . [name]  Eats vegetables   Cc:  1) Recurrent falling .  Patient states that on several occasions the falls have occurred while standing still.  She reports that she loses her  balance and falls backwards and to the right .  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  Ifact she finally received her hearing aids yesterday . Transmitter in left ear and aid in right   2) One month history of Intermittent headaches  Involving the  top of head and forehead ,  Wakes up with them,  Occurring 3-4 times per week.  Last 6 hours    No nausea,  Dizziness or vision changes.   Her sister was recently diagnosed  with a brain  aneurysm and her neurologist warned her that it was a familial hereditary pattern.     3) HTN:  Did not take her BP meds today bc she is fasting .  Does not check  At home but has a machine   3) one month history of recurrent  LUQ cramping occurring 2-3 times per week last about 4 minutes.  No nausea . No bowel changes  No diaphoresis no blood in urine or stools.  No weight loss.  Occurs at rest while watching TV.  No gassiness, bloating  or burping .  Feels like it improves  With massage of the  abdominal wall  Drinks no water,  Only coffee and Dr Reino Kent (diet )   Overdue for mammogram  and diabetic eye exam. Refuses colonoscopy   Patient is following a low glycemic index diet and taking all prescribed medications regularly without side effects.  Fasting sugars have been under less than 140 most of the time and post prandials have been under 160 except on rare occasions. Patient is exercising about 3 times per week and intentionally trying to lose weight .  Patient has had an eye exam in the last 12 months and checks feet regularly for signs of infection.  Patient does not walk barefoot outside,  And denies an numbness tingling or burning in feet. Patient is up to date on all recommended vaccinations  Outpatient Medications  Prior to Visit  Medication Sig Dispense Refill  . albuterol (PROVENTIL) (2.5 MG/3ML) 0.083% nebulizer solution Take 3 mLs (2.5 mg total) by nebulization every 8 (eight) hours as needed for wheezing or shortness of breath. 150 mL 1  . Albuterol Sulfate 108 (90 Base) MCG/ACT AEPB Inhale 1 puff into the lungs every 8 (eight) hours as needed. For wheezing 3 each 0  . amitriptyline (ELAVIL) 25 MG tablet Take 1 tablet (25 mg total) by mouth at bedtime. 30 tablet 1  . amLODipine (NORVASC) 2.5 MG tablet Take 1 tablet (2.5 mg total) by mouth daily. 90 tablet 3  . aspirin EC 81 MG tablet Take 1 tablet (81 mg total) by mouth daily. 90 tablet 2  . atorvastatin (LIPITOR) 40 MG tablet Take  1 tablet (40 mg total) by mouth daily. 90 tablet 1  . buPROPion (WELLBUTRIN SR) 150 MG 12 hr tablet TAKE 1 TABLET BY MOUTH TWO  TIMES DAILY 180 tablet 1  . Cholecalciferol (VITAMIN D3) 1000 UNITS CAPS Take by mouth.      . losartan (COZAAR) 100 MG tablet Take 1 tablet (100 mg total) by mouth daily. 30 tablet 0  . metoprolol succinate (TOPROL-XL) 50 MG 24 hr tablet Take 1 tablet (50 mg total) by mouth daily. Take with or immediately following a meal. 90 tablet 1  . PARoxetine (PAXIL) 10 MG tablet Take 1 tablet (10 mg total) by mouth every morning. 30 tablet 0  . pentosan polysulfate (ELMIRON) 100 MG capsule Take 1 capsule (100 mg total) by mouth 3 (three) times daily. 90 capsule 1  . potassium chloride SA (K-DUR,KLOR-CON) 20 MEQ tablet Take 1 tablet (20 mEq total) by mouth daily. 10 tablet 0  . predniSONE (DELTASONE) 10 MG tablet 6 tablets on Day 1 , then reduce by 1 tablet daily until gone 21 tablet 0  . vitamin E 100 UNIT capsule Take 100 Units by mouth daily.     Facility-Administered Medications Prior to Visit  Medication Dose Route Frequency Provider Last Rate Last Dose  . betamethasone acetate-betamethasone sodium phosphate (CELESTONE) injection 3 mg  3 mg Intramuscular Once Evans, Brent M, DPM      . ipratropium-albuterol (DUONEB) 0.5-2.5 (3) MG/3ML nebulizer solution 3 mL  3 mL Nebulization Q6H Allegra Grana, FNP        Review of Systems;  Patient denies headache, fevers, malaise, unintentional weight loss, skin rash, eye pain, sinus congestion and sinus pain, sore throat, dysphagia,  hemoptysis , cough, dyspnea, wheezing, chest pain, palpitations, orthopnea, edema, abdominal pain, nausea, melena, diarrhea, constipation, flank pain, dysuria, hematuria, urinary  Frequency, nocturia, numbness, tingling, seizures,  Focal weakness, Loss of consciousness,  Tremor, insomnia, depression, anxiety, and suicidal ideation.      Objective:  BP (!) 154/98 (BP Location: Left Arm, Patient  Position: Sitting, Cuff Size: Large)   Pulse 88   Temp 97.9 F (36.6 C) (Oral)   Resp 15   Ht 4\' 10"  (1.473 m)   Wt 171 lb 9.6 oz (77.8 kg)   SpO2 98%   BMI 35.86 kg/m   BP Readings from Last 3 Encounters:  07/12/18 (!) 154/98  03/12/18 136/72  01/09/18 138/76    Wt Readings from Last 3 Encounters:  07/12/18 171 lb 9.6 oz (77.8 kg)  03/12/18 165 lb (74.8 kg)  01/09/18 167 lb 1.9 oz (75.8 kg)    General appearance: alert, cooperative and appears stated age Ears: normal TM's and external ear canals both ears Throat: lips, mucosa, and  tongue normal; teeth and gums normal Neck: no adenopathy, no carotid bruit, supple, symmetrical, trachea midline and thyroid not enlarged, symmetric, no tenderness/mass/nodules Back: symmetric, no curvature. ROM normal. No CVA tenderness. Lungs: clear to auscultation bilaterally Heart: regular rate and rhythm, S1, S2 normal, no murmur, click, rub or gallop Abdomen: soft, non-tender; bowel sounds normal; no masses,  Specifically no LUQ mass or tenderness  Pulses: 2+ and symmetric Skin: Skin color, texture, turgor normal. No rashes or lesions Lymph nodes: Cervical, supraclavicular, and axillary nodes normal. Neuro:  awake and interactive with normal mood and affect. Higher cortical functions are normal. Speech is clear without word-finding difficulty or dysarthria. Extraocular movements are intact. Visual fields of both eyes are grossly intact. Sensation to light touch is grossly intact bilaterally of upper and lower extremities. Motor examination shows 4+/5 symmetric hand grip and upper extremity and 4+/5 lower extremity strength. There is no pronation or drift, however she has a positive Romberg sign . Gait is non-ataxic.  She is unable to stand on either leg for more than 1 second due to loss of balance.  she    Lab Results  Component Value Date   HGBA1C 6.2 07/12/2018   HGBA1C 6.2 03/12/2018   HGBA1C 6.4 11/29/2017    Lab Results  Component  Value Date   CREATININE 0.99 07/12/2018   CREATININE 1.36 (H) 03/12/2018   CREATININE 1.08 11/29/2017    Lab Results  Component Value Date   WBC 8.1 03/12/2018   HGB 13.5 03/12/2018   HCT 39.5 03/12/2018   PLT 240.0 03/12/2018   GLUCOSE 116 (H) 07/12/2018   CHOL 177 07/12/2018   TRIG 128.0 07/12/2018   HDL 41.70 07/12/2018   LDLDIRECT 141.0 10/13/2016   LDLCALC 110 (H) 07/12/2018   ALT 17 07/12/2018   AST 14 07/12/2018   NA 143 07/12/2018   K 3.7 07/12/2018   CL 107 07/12/2018   CREATININE 0.99 07/12/2018   BUN 15 07/12/2018   CO2 28 07/12/2018   TSH 0.88 11/29/2017   HGBA1C 6.2 07/12/2018   MICROALBUR 28.0 (H) 07/12/2018    Mm Screening Breast Tomo Bilateral  Result Date: 09/30/2016 CLINICAL DATA:  Screening. EXAM: 2D DIGITAL SCREENING BILATERAL MAMMOGRAM WITH CAD AND ADJUNCT TOMO COMPARISON:  Previous exam(s). ACR Breast Density Category c: The breast tissue is heterogeneously dense, which may obscure small masses. FINDINGS: There are no findings suspicious for malignancy. Images were processed with CAD. IMPRESSION: No mammographic evidence of malignancy. A result letter of this screening mammogram will be mailed directly to the patient. RECOMMENDATION: Screening mammogram in one year. (Code:SM-B-01Y) BI-RADS CATEGORY  1: Negative. Electronically Signed   By: Britta MccreedySusan  Turner M.D.   On: 09/30/2016 18:03    Assessment & Plan:   Problem List Items Addressed This Visit    Breast cancer screening    Annual mammogram has been ordered       DM (diabetes mellitus), type 2 with peripheral vascular complications (HCC) - Primary    Diagnosed in April 2018.  Well controlled by diet despite lack of follow up or dietary changes. Still drinking 3 carbonated sodas daily. .  Smoking cessation strongly  advised. Advised to start taking a baby aspirin.  Continue lipitor, losartan.    Lab Results  Component Value Date   HGBA1C 6.2 07/12/2018   Lab Results  Component Value Date   ALT 17  07/12/2018   AST 14 07/12/2018   ALKPHOS 126 (H) 07/12/2018   BILITOT 0.4 07/12/2018   Lab  Results  Component Value Date   CREATININE 0.99 07/12/2018   Lab Results  Component Value Date   MICROALBUR 28.0 (H) 07/12/2018         Relevant Orders   Hemoglobin A1c (Completed)   Comprehensive metabolic panel (Completed)   Lipid panel (Completed)   Microalbumin / creatinine urine ratio (Completed)   Headache    New daily persistent for the past month,  Associated with new onset recurrent falls due to loss of balance.  MRI/MRA brain recommended given her mutiple risk factors for CVA and her FH of cerebral aneurysm      Relevant Orders   MR MRA HEAD WO CONTRAST   Hyperlipidemia    She is tolerating generic Lipitor given elevated risk , for goal LDL < 70.  Lab Results  Component Value Date   CHOL 177 07/12/2018   HDL 41.70 07/12/2018   LDLCALC 110 (H) 07/12/2018   LDLDIRECT 141.0 10/13/2016   TRIG 128.0 07/12/2018   CHOLHDL 4 07/12/2018         Renovascular hypertension    Elevated today because she has not taken her medications because she has been fasting.  Reminded patient to continue BP medications regardless of prandial state. No changes today.  Continue amlodipine and losartan   Lab Results  Component Value Date   CREATININE 0.99 07/12/2018   Lab Results  Component Value Date   NA 143 07/12/2018   K 3.7 07/12/2018   CL 107 07/12/2018   CO2 28 07/12/2018          Other Visit Diagnoses    Encounter for screening mammogram for breast cancer       Relevant Orders   MM 3D SCREEN BREAST BILATERAL   Recurrent falls       Relevant Orders   MR MRA HEAD WO CONTRAST   Family history of aneurysm of blood vessel of brain       Relevant Orders   MR MRA HEAD WO CONTRAST   Colon cancer screening       Relevant Orders   Cologuard   Need for immunization against influenza       Relevant Orders   Flu Vaccine QUAD 36+ mos IM (Completed)      I am having Patricia Fields maintain her Vitamin D3, vitamin E, pentosan polysulfate, amitriptyline, Albuterol Sulfate, albuterol, buPROPion, aspirin EC, atorvastatin, metoprolol succinate, PARoxetine, amLODipine, predniSONE, potassium chloride SA, and losartan. We will continue to administer ipratropium-albuterol and betamethasone acetate-betamethasone sodium phosphate.  No orders of the defined types were placed in this encounter.   There are no discontinued medications.  Follow-up: No follow-ups on file.   Sherlene Shams, MD

## 2018-07-14 DIAGNOSIS — R1012 Left upper quadrant pain: Secondary | ICD-10-CM | POA: Insufficient documentation

## 2018-07-14 DIAGNOSIS — R51 Headache: Secondary | ICD-10-CM

## 2018-07-14 DIAGNOSIS — R519 Headache, unspecified: Secondary | ICD-10-CM | POA: Insufficient documentation

## 2018-07-14 NOTE — Assessment & Plan Note (Signed)
Elevated today because she has not taken her medications because she has been fasting.  Reminded patient to continue BP medications regardless of prandial state. No changes today.  Continue amlodipine and losartan   Lab Results  Component Value Date   CREATININE 0.99 07/12/2018   Lab Results  Component Value Date   NA 143 07/12/2018   K 3.7 07/12/2018   CL 107 07/12/2018   CO2 28 07/12/2018

## 2018-07-14 NOTE — Assessment & Plan Note (Signed)
Her abdominal exam is entirely normal and there are no systemic symptoms associated with her episodes of pain .  Her pain is likely a muscle cramp from dehdyration; she was  advised to increase her water intake.   Lab Results  Component Value Date   WBC 8.1 03/12/2018   HGB 13.5 03/12/2018   HCT 39.5 03/12/2018   MCV 93.9 03/12/2018   PLT 240.0 03/12/2018

## 2018-07-14 NOTE — Assessment & Plan Note (Signed)
Annual mammogram has been ordered 

## 2018-07-14 NOTE — Assessment & Plan Note (Signed)
She is tolerating generic Lipitor given elevated risk , for goal LDL < 70.  Lab Results  Component Value Date   CHOL 177 07/12/2018   HDL 41.70 07/12/2018   LDLCALC 110 (H) 07/12/2018   LDLDIRECT 141.0 10/13/2016   TRIG 128.0 07/12/2018   CHOLHDL 4 07/12/2018

## 2018-07-14 NOTE — Assessment & Plan Note (Signed)
New daily persistent for the past month,  Associated with new onset recurrent falls due to loss of balance.  MRI/MRA brain recommended given her mutiple risk factors for CVA and her FH of cerebral aneurysm

## 2018-07-14 NOTE — Assessment & Plan Note (Signed)
Diagnosed in April 2018.  Well controlled by diet despite lack of follow up or dietary changes. Still drinking 3 carbonated sodas daily. .  Smoking cessation strongly  advised. Advised to start taking a baby aspirin.  Continue lipitor, losartan.    Lab Results  Component Value Date   HGBA1C 6.2 07/12/2018   Lab Results  Component Value Date   ALT 17 07/12/2018   AST 14 07/12/2018   ALKPHOS 126 (H) 07/12/2018   BILITOT 0.4 07/12/2018   Lab Results  Component Value Date   CREATININE 0.99 07/12/2018   Lab Results  Component Value Date   MICROALBUR 28.0 (H) 07/12/2018

## 2018-08-07 ENCOUNTER — Ambulatory Visit: Admission: RE | Admit: 2018-08-07 | Payer: Medicare HMO | Source: Ambulatory Visit

## 2018-08-20 ENCOUNTER — Other Ambulatory Visit: Payer: Self-pay | Admitting: Internal Medicine

## 2018-08-20 NOTE — Telephone Encounter (Signed)
Copied from CRM [phone]. Topic: Quick Communication - Rx Refill/Question >> Aug 20, 2018 11:40 AM Baldo DaubAlexander, Amber L wrote: Medication:  atorvastatin (LIPITOR) 40 MG tablet amLODipine (NORVASC) 2.5 MG tablet buPROPion (WELLBUTRIN SR) 150 MG 12 hr tablet  PARoxetine (PAXIL) 10 MG tablet losartan (COZAAR) 100 MG tablet (pt also states there is another one but she doesn't know the name of it and states her memory isn't good, but knows she is supposed to be taking 6 pills)  Has the patient contacted their pharmacy? Yes - states they need refills from office prior to filling. (Agent: If no, request that the patient contact the pharmacy for the refill.) (Agent: If yes, when and what did the pharmacy advise?)  Preferred Pharmacy (with phone number or street name): [phone] (Fax)  [facility] [place] [place] [phone] (Phone) [phone] (Fax)  Agent: Please be advised that RX refills may take up to 3 business days. We ask that you follow-up with your pharmacy.

## 2018-08-21 MED ORDER — LOSARTAN POTASSIUM 100 MG PO TABS: 100.0000 mg | ORAL_TABLET | Freq: Every day | ORAL | 0 refills | Status: DC

## 2018-08-21 MED ORDER — PAROXETINE HCL 10 MG PO TABS: 10.0000 mg | ORAL_TABLET | Freq: Every morning | ORAL | 0 refills | Status: DC

## 2018-08-21 MED ORDER — BUPROPION HCL ER (SR) 150 MG PO TB12: 150.0000 mg | ORAL_TABLET | Freq: Two times a day (BID) | ORAL | 1 refills | Status: DC

## 2018-08-21 MED ORDER — AMLODIPINE BESYLATE 2.5 MG PO TABS: 2.5000 mg | ORAL_TABLET | Freq: Every day | ORAL | 3 refills | Status: DC

## 2018-08-21 MED ORDER — ATORVASTATIN CALCIUM 40 MG PO TABS: 40.0000 mg | ORAL_TABLET | Freq: Every day | ORAL | 1 refills | Status: DC

## 2018-08-27 ENCOUNTER — Encounter: Payer: Self-pay | Admitting: Internal Medicine

## 2018-08-27 MED ORDER — ATORVASTATIN CALCIUM 40 MG PO TABS: 40.0000 mg | ORAL_TABLET | Freq: Every day | ORAL | 2 refills | Status: DC

## 2018-08-27 MED ORDER — BUPROPION HCL ER (SR) 150 MG PO TB12: 150.0000 mg | ORAL_TABLET | Freq: Two times a day (BID) | ORAL | 1 refills | Status: DC

## 2018-08-27 MED ORDER — PAROXETINE HCL 10 MG PO TABS: 10.0000 mg | ORAL_TABLET | Freq: Every morning | ORAL | 1 refills | Status: DC

## 2018-08-27 MED ORDER — AMLODIPINE BESYLATE 2.5 MG PO TABS: 2.5000 mg | ORAL_TABLET | Freq: Every day | ORAL | 1 refills | Status: DC

## 2018-08-27 MED ORDER — LOSARTAN POTASSIUM 100 MG PO TABS: 100.0000 mg | ORAL_TABLET | Freq: Every day | ORAL | 1 refills | Status: DC

## 2018-08-27 NOTE — Telephone Encounter (Signed)
Pt calling in.  Pt requested these medications be sent to [facility] delivery, but they were sent to [facility] instead.  Pt states that [facility] delivery will not transfer over medications and that RX needs to be sent to them. Pt states that she remember that the pill she was not remembering was her 81MG  asprin and she gets that OTC.

## 2018-08-27 NOTE — Addendum Note (Signed)
Addended by: Wilford Corner on: 08/27/2018 10:21 AM   Modules accepted: Orders

## 2018-08-31 ENCOUNTER — Ambulatory Visit
Admission: RE | Admit: 2018-08-31 | Discharge: 2018-08-31 | Disposition: A | Discharge status: Home / Self Care | Payer: Medicare HMO | Source: Ambulatory Visit | Attending: Internal Medicine | Admitting: Internal Medicine

## 2018-08-31 DIAGNOSIS — G4452 New daily persistent headache (NDPH): Secondary | ICD-10-CM

## 2018-08-31 DIAGNOSIS — R296 Repeated falls: Secondary | ICD-10-CM | POA: Diagnosis not present

## 2018-08-31 DIAGNOSIS — Z8249 Family history of ischemic heart disease and other diseases of the circulatory system: Secondary | ICD-10-CM | POA: Diagnosis not present

## 2018-08-31 DIAGNOSIS — R42 Dizziness and giddiness: Secondary | ICD-10-CM | POA: Diagnosis not present

## 2018-09-11 DIAGNOSIS — I701 Atherosclerosis of renal artery: Secondary | ICD-10-CM | POA: Diagnosis not present

## 2018-09-11 DIAGNOSIS — Z95820 Peripheral vascular angioplasty status with implants and grafts: Secondary | ICD-10-CM | POA: Diagnosis not present

## 2018-09-11 DIAGNOSIS — R69 Illness, unspecified: Secondary | ICD-10-CM | POA: Diagnosis not present

## 2018-09-11 DIAGNOSIS — I10 Essential (primary) hypertension: Secondary | ICD-10-CM | POA: Diagnosis not present

## 2018-10-15 ENCOUNTER — Ambulatory Visit (INDEPENDENT_AMBULATORY_CARE_PROVIDER_SITE_OTHER): Payer: Medicare HMO | Admitting: Internal Medicine

## 2018-10-15 ENCOUNTER — Encounter: Payer: Self-pay | Admitting: Internal Medicine

## 2018-10-15 VITALS — BP 178/86 | HR 98 | Temp 98.0°F | Resp 16 | Ht <= 58 in | Wt 161.8 lb

## 2018-10-15 DIAGNOSIS — E782 Mixed hyperlipidemia: Secondary | ICD-10-CM | POA: Diagnosis not present

## 2018-10-15 DIAGNOSIS — Z0000 Encounter for general adult medical examination without abnormal findings: Secondary | ICD-10-CM

## 2018-10-15 DIAGNOSIS — Z716 Tobacco abuse counseling: Secondary | ICD-10-CM | POA: Diagnosis not present

## 2018-10-15 DIAGNOSIS — Z1239 Encounter for other screening for malignant neoplasm of breast: Secondary | ICD-10-CM

## 2018-10-15 DIAGNOSIS — E1151 Type 2 diabetes mellitus with diabetic peripheral angiopathy without gangrene: Secondary | ICD-10-CM

## 2018-10-15 DIAGNOSIS — R748 Abnormal levels of other serum enzymes: Secondary | ICD-10-CM

## 2018-10-15 DIAGNOSIS — E669 Obesity, unspecified: Secondary | ICD-10-CM

## 2018-10-15 DIAGNOSIS — N3010 Interstitial cystitis (chronic) without hematuria: Secondary | ICD-10-CM | POA: Diagnosis not present

## 2018-10-15 DIAGNOSIS — L02212 Cutaneous abscess of back [any part, except buttock]: Secondary | ICD-10-CM | POA: Diagnosis not present

## 2018-10-15 DIAGNOSIS — H8120 Vestibular neuronitis, unspecified ear: Secondary | ICD-10-CM | POA: Diagnosis not present

## 2018-10-15 DIAGNOSIS — E1121 Type 2 diabetes mellitus with diabetic nephropathy: Secondary | ICD-10-CM

## 2018-10-15 MED ORDER — ONDANSETRON 4 MG PO TBDP: 4.0000 mg | ORAL_TABLET | Freq: Three times a day (TID) | ORAL | 2 refills | Status: DC | PRN

## 2018-10-15 MED ORDER — SULFAMETHOXAZOLE-TRIMETHOPRIM 800-160 MG PO TABS: 1.0000 | ORAL_TABLET | Freq: Two times a day (BID) | ORAL | 0 refills | Status: DC

## 2018-10-15 MED ORDER — VARENICLINE TARTRATE 0.5 MG X 11 & 1 MG X 42 PO MISC: ORAL | 0 refills | Status: DC

## 2018-10-15 NOTE — Progress Notes (Addendum)
Patient ID: Patricia Fields, female    DOB: 1956-03-11  Age: 63 y.o. MRN: [id number]  The patient is here for annual preventive  examination and management of other chronic and acute problems.   Mammogram scheduled  The risk factors are reflected in the social history.  The roster of all physicians providing medical care to patient - is listed in the Snapshot section of the chart.  Activities of daily living:  The patient is 100% independent in all ADLs: dressing, toileting, feeding as well as independent mobility  Home safety : The patient has smoke detectors in the home. They wear seatbelts.  There are no firearms at home. There is no violence in the home.   There is no risks for hepatitis, STDs or HIV. There is no   history of blood transfusion. They have no travel history to infectious disease endemic areas of the world.  The patient has seen their dentist in the last six month. They have seen their eye doctor in the last year. They admit to slight hearing difficulty with regard to whispered voices and some television programs.  They have deferred audiologic testing in the last year.  They do not  have excessive sun exposure. Discussed the need for sun protection: hats, long sleeves and use of sunscreen if there is significant sun exposure.   Diet: the importance of a healthy diet is discussed. They do have a healthy diet.  The benefits of regular aerobic exercise were discussed. She walks 4 times per week ,  20 minutes.   Depression screen: there are no signs or vegative symptoms of depression- irritability, change in appetite, anhedonia, sadness/tearfullness.  Cognitive assessment: the patient manages all their financial and personal affairs and is actively engaged. They could relate day,date,year and events; recalled 2/3 objects at 3 minutes; performed clock-face test normally.  The following portions of the patient's history were reviewed and updated as appropriate: allergies, current  medications, past family history, past medical history,  past surgical history, past social history  and problem list.  Visual acuity was not assessed per patient preference since she has regular follow up with her ophthalmologist. Hearing and body mass index were assessed and reviewed.   During the course of the visit the patient was educated and counseled about appropriate screening and preventive services including : fall prevention , diabetes screening, nutrition counseling, colorectal cancer screening, and recommended immunizations.    CC: The primary encounter diagnosis was DM (diabetes mellitus), type 2 with peripheral vascular complications (HCC). Diagnoses of Tobacco abuse counseling, Subacute vestibular neuronitis, unspecified laterality, Obesity (BMI 30.0-34.9), Interstitial cystitis, Mixed hyperlipidemia, Abscess of back, except buttock, Breast cancer screening, Encounter for preventive health examination, Elevated alkaline phosphatase measurement, Elevated alkaline phosphatase level, and Diabetic nephropathy with proteinuria (HCC) were also pertinent to this visit.  Cc:  Has developed an enlarging painful raised area on the medial side of her  right right shoulder blade that  "Popped up"  On Friday after scratching the area that had become itchy..  Denies fevers,  But pain has started to radiate to right shoulder.     T2DM:  Does not check sugars.  Has elminated bread and sodas, but eats pasta several times per week (daughter's preference who prepares meals) and potatoes nearly daily.  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 No vomiting.   Headaches improving,  Occurring now about once  a week , wakes up with a headache . Workup reviewed:   Had an MRI/MRA  That was normal.   Has not had a sleep study in years,   Has risk factors for  sleep apnea  Including snoring, obesity    and hypertension   History Patricia Fields has a past medical history of 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ervical region, Chronic kidney disease, COPD (chronic obstructive pulmonary disease) (HCC), History of nephrolithiasis, HOH (hard of hearing), Hyperlipidemia, Hypertension, Interstitial cystitis, Meniere disease, Peripheral arterial disease (HCC) (Feb 2011), and Tobacco abuse.   She has a past surgical history that includes Total abdominal hysterectomy w/ bilateral salpingoophorectomy (1992); arthroscopy; Breast surgery; Carpal tunnel release; Abdominal hysterectomy; Knee arthroscopy (Left); Cataract extraction w/PHACO (Right, 03/05/2015); and Breast excisional biopsy (Left, 1980's).   Her family history includes Breast cancer in her maternal grandmother; COPD in her mother; Coronary artery disease in her mother; Heart disease in her mother.She reports that she has been smoking cigarettes. She has been smoking about 1.00 pack per day. She has never used smokeless tobacco. She reports that she does not drink alcohol or use drugs.  Outpatient Medications Prior to Visit  Medication Sig Dispense Refill  . albuterol (PROVENTIL) (2.5 MG/3ML) 0.083% nebulizer solution Take 3 mLs (2.5 mg total) by nebulization every 8 (eight) hours as needed for wheezing or shortness of breath. 150 mL 1  . Albuterol Sulfate 108 (90 Base) MCG/ACT AEPB Inhale 1 puff into the lungs every 8 (eight) hours as needed. For wheezing 3 each 0  . amitriptyline (ELAVIL) 25 MG tablet Take 1 tablet (25 mg total) by mouth at bedtime. 30 tablet 1  . amLODipine (NORVASC) 2.5 MG tablet Take 1 tablet (2.5 mg total) by mouth daily. 90 tablet 1  . aspirin EC 81 MG tablet Take 1 tablet (81 mg total) by mouth daily. 90 tablet 2  . atorvastatin (LIPITOR) 40 MG tablet Take 1 tablet (40 mg total) by mouth daily. 90 tablet 2  . buPROPion (WELLBUTRIN SR) 150 MG 12 hr tablet Take 1 tablet (150 mg total) by mouth 2 (two) times daily. 180 tablet 1   . Cholecalciferol (VITAMIN D3) 1000 UNITS CAPS Take by mouth.      . losartan (COZAAR) 100 MG tablet Take 1 tablet (100 mg total) by mouth daily. 90 tablet 1  . metoprolol succinate (TOPROL-XL) 50 MG 24 hr tablet Take 1 tablet (50 mg total) by mouth daily. Take with or immediately following a meal. 90 tablet 1  . PARoxetine (PAXIL) 10 MG tablet Take 1 tablet (10 mg total) by mouth every morning. 90 tablet 1  . pentosan polysulfate (ELMIRON) 100 MG capsule Take 1 capsule (100 mg total) by mouth 3 (three) times daily. 90 capsule 1  . potassium chloride SA (K-DUR,KLOR-CON) 20 MEQ tablet Take 1 tablet (20 mEq total) by mouth daily. 10 tablet 0  . vitamin E 100 UNIT capsule Take 100 Units by mouth daily.    . predniSONE (DELTASONE) 10 MG tablet 6 tablets on Day 1 , then reduce by 1 tablet daily until gone (Patient not taking: Reported on 10/15/2018) 21 tablet 0   Facility-Administered Medications Prior to Visit  Medication Dose Route Frequency Provider Last Rate Last Dose  . betamethasone acetate-betamethasone sodium phosphate (CELESTONE) injection 3 mg  3 mg Intramuscular Once Evans, Kipp Brood M, DPM      . ipratropium-albuterol (DUONEB) 0.5-2.5 (3) MG/3ML nebulizer solution 3 mL  3 mL Nebulization Q6H Arnett, Lyn Records, FNP  Review of Systems   Patient denies  fevers, malaise, unintentional weight loss, skin rash, eye pain, sinus congestion and sinus pain, sore throat, dysphagia,  hemoptysis , cough, dyspnea, wheezing, chest pain, palpitations, orthopnea, edema, abdominal pain, , melena, diarrhea, constipation, flank pain, dysuria, hematuria, urinary  Frequency, nocturia, numbness, tingling, seizures,  Focal weakness, Loss of consciousness,  Tremor, insomnia, depression, anxiety, and suicidal ideation.      Objective:  BP (!) 178/86 (BP Location: Left Arm, Patient Position: Sitting, Cuff Size: Normal)   Pulse 98   Temp 98 F (36.7 C) (Oral)   Resp 16   Ht 4\' 10"  (1.473 m)   Wt 161 lb  12.8 oz (73.4 kg)   SpO2 97%   BMI 33.82 kg/m   Physical Exam   General appearance: alert, cooperative and appears stated age Ears: normal TM's and external ear canals both ears Throat: lips, mucosa, and tongue normal; teeth and gums normal Neck: no adenopathy, no carotid bruit, supple, symmetrical, trachea midline and thyroid not enlarged, symmetric, no tenderness/mass/nodules Back: symmetric, no curvature. ROM normal. No CVA tenderness. Lungs: clear to auscultation bilaterally Heart: regular rate and rhythm, S1, S2 normal, no murmur, click, rub or gallop Abdomen: soft, non-tender; bowel sounds normal; no masses,  no organomegaly Pulses: 2+ and symmetric Skin: 6 cm raised, warm, erythematous area on right posteriori shoulder , nonfluctuant but with multiple pre pustular points suggestive of abscess.  Lymph nodes: Cervical, supraclavicular, and axillary nodes normal.    Assessment & Plan:   Problem List Items Addressed This Visit    Tobacco abuse counseling    She is having trouble quitting but has reduced her intake.  chantix discussed and trial accepted.  Starter pack sent to mail order.       Subacute vestibular neuronitis    With recurrent episodes of nausea and vertigo now brought on with barometric pressure changes.  Zofran odt prescribed       Relevant Medications   varenicline (CHANTIX STARTING MONTH PAK) 0.5 MG X 11 & 1 MG X 42 tablet   Obesity (BMI 30.0-34.9)    I have congratulated her in reduction of   BMI and encouraged  Continued weight loss with goal of 10% of body weigh over the next 6 months using a low glycemic index diet and regular exercise a minimum of 5 days per week.        Interstitial cystitis    Symptoms have resolved with dietary avoidance of chocolate.       Hyperlipidemia    She is tolerating generic Lipitor given elevated risk , for goal LDL < 70. Repeat lipids and LFTs  are pending   Lab Results  Component Value Date   CHOL 177 07/12/2018    HDL 41.70 07/12/2018   LDLCALC 110 (H) 07/12/2018   LDLDIRECT 141.0 10/13/2016   TRIG 128.0 07/12/2018   CHOLHDL 4 07/12/2018   Lab Results  Component Value Date   ALT 17 07/12/2018   AST 14 07/12/2018   ALKPHOS 126 (H) 07/12/2018   BILITOT 0.4 07/12/2018         Encounter for preventive health examination    Annual comprehensive preventive exam was done as well as an evaluation and management of acute and chronic conditions .  During the course of the visit the patient was educated and counseled about appropriate screening and preventive services including :   breast and colorectal cancer screening, and recommended immunizations.        Elevated  alkaline phosphatase level    Persistent .  Return for additional serologies to rule out autoimmune and viral etiologies       DM (diabetes mellitus), type 2 with peripheral vascular complications [facility]) - Primary    Diagnosed in April 2018.  Well managed with  diet .  Smoking cessation strongly  Advised and in progress.  Continue  baby aspirin.  Continue lipitor, losartan.  Repeat labs pending  Diet reviewed in detail and one touch glucometer given per patient request   Lab Results  Component Value Date   HGBA1C 6.1 10/15/2018   Lab Results  Component Value Date   ALT 15 10/15/2018   AST 14 10/15/2018   ALKPHOS 122 (H) 10/15/2018   BILITOT 0.3 10/15/2018   Lab Results  Component Value Date   CREATININE 1.05 10/15/2018   Lab Results  Component Value Date   MICROALBUR 28.0 (H) 07/12/2018         Relevant Orders   Hemoglobin A1c (Completed)   Comprehensive metabolic panel (Completed)   Lipid panel (Completed)   Diabetic nephropathy with proteinuria (HCC)    Continue losartan      Breast cancer screening    Annual mammogram has been ordered      Abscess of back, except buttock    After infomred consent was obtained for procedure,  AnIncision and drainage was attempted using topical anesthetic and scalpel.  However  the abscess  unsuccessfully yielded anything but blood.  Patient prscribed Sydell Axon DS x 7 days and advised to use hot compresses  Every few hours to encourage drainage.  RN visit for follow up in 24-48 hours given.        Other Visit Diagnoses    Elevated alkaline phosphatase measurement       Relevant Orders   Alkaline phosphatase, isoenzymes   ANA   Anti-Tabbert antibody   Hepatitis B core antibody, total   Hepatitis B surface antibody,qualitative   Hepatitis B surface antigen   Hepatitis C antibody   IBC + Ferritin   Mitochondrial antibodies      I have discontinued Karenna R. Gottschall's predniSONE. I am also having her start on sulfamethoxazole-trimethoprim, varenicline, ondansetron, glucose blood, and freestyle. Additionally, I am having her maintain her Vitamin D3, vitamin E, pentosan polysulfate, amitriptyline, Albuterol Sulfate, albuterol, aspirin EC, metoprolol succinate, potassium chloride SA, atorvastatin, amLODipine, buPROPion, PARoxetine, and losartan. We will continue to administer ipratropium-albuterol and betamethasone acetate-betamethasone sodium phosphate.  Meds ordered this encounter  Medications  . sulfamethoxazole-trimethoprim (BACTRIM DS,SEPTRA DS) 800-160 MG tablet    Sig: Take 1 tablet by mouth 2 (two) times daily.    Dispense:  14 tablet    Refill:  0  . varenicline (CHANTIX STARTING MONTH PAK) 0.5 MG X 11 & 1 MG X 42 tablet    Sig: Take one 0.5 mg tablet by mouth once daily for 3 days, then increase to one 0.5 mg tablet twice daily for 4 days, then increase to one 1 mg tablet twice daily.    Dispense:  53 tablet    Refill:  0  . ondansetron (ZOFRAN ODT) 4 MG disintegrating tablet    Sig: Take 1 tablet (4 mg total) by mouth every 8 (eight) hours as needed for nausea or vomiting.    Dispense:  60 tablet    Refill:  2  . glucose blood test strip    Sig: Use once daily to check blood sugars  E11.9    Dispense:  100 each  Refill:  3  . Lancets (FREESTYLE) lancets     Sig: Use once daily to test blood sugars  E11.9    Dispense:  100 each    Refill:  3    Medications Discontinued During This Encounter  Medication Reason  . predniSONE (DELTASONE) 10 MG tablet Completed Course     A total of 40 minutes was spent with patient more than half of which was spent in counseling patient on the above mentioned issues , reviewing and explaining recent labs and imaging studies done, and coordination of care.  Follow-up: No follow-ups on file.   Sherlene Shams, MD

## 2018-10-15 NOTE — Patient Instructions (Addendum)
I am starting you on Septra Ds antibiotic tonight   Take twice daily with food  Put hot compresses on your boil every few hours   If it becomes larger overnight,  Call for an urgent referral to surgery for an incision and gauze  If it becomes smaller and/or starts draining  Pus, that's a  GOOD DEAL!!  KEEP DOING WHAT YOU ARE DOING   Call the office in the morning and give [place] an update    Please check your blood  sugar 2 hrs after dinner to see if pasta is a "no no"    Sugars > 160 after dinner mean the meal has too much sugar

## 2018-10-16 DIAGNOSIS — L02212 Cutaneous abscess of back [any part, except buttock]: Secondary | ICD-10-CM | POA: Insufficient documentation

## 2018-10-16 DIAGNOSIS — R69 Illness, unspecified: Secondary | ICD-10-CM | POA: Diagnosis not present

## 2018-10-16 LAB — COMPREHENSIVE METABOLIC PANEL WITH GFR
ALT: 15 U/L (ref 0–35)
AST: 14 U/L (ref 0–37)
Albumin: 4.2 g/dL (ref 3.5–5.2)
Alkaline Phosphatase: 122 U/L — ABNORMAL HIGH (ref 39–117)
BUN: 17 mg/dL (ref 6–23)
CO2: 29 meq/L (ref 19–32)
Calcium: 10.1 mg/dL (ref 8.4–10.5)
Chloride: 106 meq/L (ref 96–112)
Creatinine, Ser: 1.05 mg/dL (ref 0.40–1.20)
GFR: 52.84 mL/min — ABNORMAL LOW
Glucose, Bld: 93 mg/dL (ref 70–99)
Potassium: 4.1 meq/L (ref 3.5–5.1)
Sodium: 143 meq/L (ref 135–145)
Total Bilirubin: 0.3 mg/dL (ref 0.2–1.2)
Total Protein: 7.2 g/dL (ref 6.0–8.3)

## 2018-10-16 LAB — HEMOGLOBIN A1C: Hgb A1c MFr Bld: 6.1 % (ref 4.6–6.5)

## 2018-10-16 LAB — LIPID PANEL
Cholesterol: 169 mg/dL (ref 0–200)
HDL: 43.4 mg/dL
LDL Cholesterol: 98 mg/dL (ref 0–99)
NonHDL: 125.43
Total CHOL/HDL Ratio: 4
Triglycerides: 137 mg/dL (ref 0.0–149.0)
VLDL: 27.4 mg/dL (ref 0.0–40.0)

## 2018-10-16 MED ORDER — GLUCOSE BLOOD VI STRP: ORAL_STRIP | 3 refills | Status: DC

## 2018-10-16 MED ORDER — FREESTYLE LANCETS MISC: 3 refills | Status: DC

## 2018-10-16 NOTE — Assessment & Plan Note (Signed)
Symptoms have resolved with dietary avoidance of chocolate.

## 2018-10-16 NOTE — Assessment & Plan Note (Addendum)
After infomred consent was obtained for procedure,  AnIncision and drainage was attempted using topical anesthetic and scalpel.  However the abscess  unsuccessfully yielded anything but blood.  Patient prscribed Sydell Axon DS x 7 days and advised to use hot compresses  Every few hours to encourage drainage.  RN visit for follow up in 24-48 hours given.

## 2018-10-16 NOTE — Assessment & Plan Note (Signed)
With recurrent episodes of nausea and vertigo now brought on with barometric pressure changes.  Zofran odt prescribed

## 2018-10-16 NOTE — Assessment & Plan Note (Signed)
She is having trouble quitting but has reduced her intake.  chantix discussed and trial accepted.  Starter pack sent to mail order.

## 2018-10-16 NOTE — Assessment & Plan Note (Signed)
I have congratulated her in reduction of   BMI and encouraged  Continued weight loss with goal of 10% of body weigh over the next 6 months using a low glycemic index diet and regular exercise a minimum of 5 days per week.    

## 2018-10-16 NOTE — Assessment & Plan Note (Addendum)
Diagnosed in April 2018.  Well managed with  diet .  Smoking cessation strongly  Advised and in progress.  Continue  baby aspirin.  Continue lipitor, losartan.  Repeat labs pending  Diet reviewed in detail and one touch glucometer given per patient request   Lab Results  Component Value Date   HGBA1C 6.1 10/15/2018   Lab Results  Component Value Date   ALT 15 10/15/2018   AST 14 10/15/2018   ALKPHOS 122 (H) 10/15/2018   BILITOT 0.3 10/15/2018   Lab Results  Component Value Date   CREATININE 1.05 10/15/2018   Lab Results  Component Value Date   MICROALBUR 28.0 (H) 07/12/2018

## 2018-10-16 NOTE — Assessment & Plan Note (Signed)
She is tolerating generic Lipitor given elevated risk , for goal LDL < 70. Repeat lipids and LFTs  are pending   Lab Results  Component Value Date   CHOL 177 07/12/2018   HDL 41.70 07/12/2018   LDLCALC 110 (H) 07/12/2018   LDLDIRECT 141.0 10/13/2016   TRIG 128.0 07/12/2018   CHOLHDL 4 07/12/2018   Lab Results  Component Value Date   ALT 17 07/12/2018   AST 14 07/12/2018   ALKPHOS 126 (H) 07/12/2018   BILITOT 0.4 07/12/2018

## 2018-10-16 NOTE — Assessment & Plan Note (Signed)
Annual mammogram has been ordered

## 2018-10-16 NOTE — Assessment & Plan Note (Signed)
Annual comprehensive preventive exam was done as well as an evaluation and management of acute and chronic conditions .  During the course of the visit the patient was educated and counseled about appropriate screening and preventive services including :   breast and colorectal cancer screening, and recommended immunizations.

## 2018-10-17 DIAGNOSIS — R748 Abnormal levels of other serum enzymes: Secondary | ICD-10-CM | POA: Insufficient documentation

## 2018-10-17 DIAGNOSIS — E1121 Type 2 diabetes mellitus with diabetic nephropathy: Secondary | ICD-10-CM | POA: Insufficient documentation

## 2018-10-17 NOTE — Addendum Note (Signed)
Addended by: Sherlene Shams on: 10/17/2018 01:38 PM   Modules accepted: Orders

## 2018-10-17 NOTE — Assessment & Plan Note (Signed)
Continue losartan. 

## 2018-10-17 NOTE — Assessment & Plan Note (Signed)
Persistent .  Return for additional serologies to rule out autoimmune and viral etiologies

## 2018-10-22 ENCOUNTER — Ambulatory Visit: Payer: Self-pay

## 2018-10-22 NOTE — Telephone Encounter (Signed)
Called pt and unable to LM voice mail being full- called [phone] and recording that number is disconnected or no longer in service

## 2018-10-22 NOTE — Telephone Encounter (Signed)
Patient calls requesting appointment for her back abscess lanced by Dr. Darrick Huntsman last week. Marchelle Folks, who is doing dressing changes twice a day describes the would as pink, less swollen, clear drainage , no heat to it and no red streaks from the area. Patient continues to take the antibiotic prescribed to her. They will continue the dressing changes twice daily. Signs and symptoms of infection reviewed with both the caretaker and the patient. Stated understanding and will call back if any of those symptoms occur.  Reason for Disposition . Wound doesn't sound infected  Answer Assessment - Initial Assessment Questions 1. LOCATION: "Where is the wound located?"      On her back. 2. WOUND APPEARANCE: "What does the wound look like?"      Red area about the size of her palm. 3. SIZE: If redness is present, ask: "What is the size of the red area?" (Inches, centimeters, or compare to size of a coin)      Entire area. 4. SPREAD: "What's changed in the last day?"  "Do you see any red streaks coming from the wound?"     Clear drainage. No pus noted.  5. ONSET: "When did it start to look infected?"      Stated does not look infected. No heat at wound. 6. MECHANISM: "How did the wound start, what was the cause?"      7. PAIN: "Is there any pain?" If so, ask: "How bad is the pain?"   (Scale 1-10; or mild, moderate, severe)     none 8. FEVER: "Do you have a fever?" If so, ask: "What is your temperature, how was it measured, and when did it start?"     no 9. OTHER SYMPTOMS: "Do you have any other symptoms?" (e.g., shaking chills, weakness, rash elsewhere on body)     no 10. PREGNANCY: "Is there any chance you are pregnant?" "When was your last menstrual period?"       no  Protocols used: WOUND INFECTION-A-AH

## 2018-10-24 ENCOUNTER — Other Ambulatory Visit: Payer: Self-pay | Admitting: Internal Medicine

## 2018-10-24 ENCOUNTER — Ambulatory Visit
Admission: RE | Admit: 2018-10-24 | Discharge: 2018-10-24 | Disposition: A | Discharge status: Home / Self Care | Payer: Medicare Other | Source: Ambulatory Visit | Attending: Internal Medicine | Admitting: Internal Medicine

## 2018-10-24 DIAGNOSIS — Z1231 Encounter for screening mammogram for malignant neoplasm of breast: Secondary | ICD-10-CM | POA: Diagnosis not present

## 2018-10-24 DIAGNOSIS — R921 Mammographic calcification found on diagnostic imaging of breast: Secondary | ICD-10-CM

## 2018-10-24 DIAGNOSIS — R928 Other abnormal and inconclusive findings on diagnostic imaging of breast: Secondary | ICD-10-CM

## 2018-10-25 ENCOUNTER — Encounter: Payer: Self-pay | Admitting: Internal Medicine

## 2018-10-25 DIAGNOSIS — R921 Mammographic calcification found on diagnostic imaging of breast: Secondary | ICD-10-CM | POA: Insufficient documentation

## 2018-10-30 ENCOUNTER — Telehealth: Payer: Self-pay

## 2018-10-30 LAB — HM DIABETES EYE EXAM

## 2018-10-30 NOTE — Telephone Encounter (Signed)
Copied from CRM [id number]. Topic: Referral - Question >> Oct 30, 2018  8:26 AM Saverio Danker J wrote: Reason for CRM: pt was told that [facility] would call her for a follow up test and she has not received a call, please call [phone]

## 2018-10-30 NOTE — Telephone Encounter (Signed)
Called pt to let her know that she can call [facility] to schedule the appt because the orders have been placed by Dr. Darrick Huntsman. Pt stated that she would call tomorrow to schedule. Pt also wanted to let you know that she went to the eye doctor today and she has a cataract on left eye and will be having surgery in April.

## 2018-11-02 ENCOUNTER — Ambulatory Visit
Admission: RE | Admit: 2018-11-02 | Discharge: 2018-11-02 | Disposition: A | Discharge status: Home / Self Care | Payer: Medicare Other | Source: Ambulatory Visit | Attending: Internal Medicine | Admitting: Internal Medicine

## 2018-11-02 ENCOUNTER — Other Ambulatory Visit: Payer: Self-pay

## 2018-11-02 DIAGNOSIS — R921 Mammographic calcification found on diagnostic imaging of breast: Secondary | ICD-10-CM

## 2018-11-02 DIAGNOSIS — R928 Other abnormal and inconclusive findings on diagnostic imaging of breast: Secondary | ICD-10-CM | POA: Diagnosis not present

## 2018-11-05 ENCOUNTER — Other Ambulatory Visit: Payer: Self-pay | Admitting: Internal Medicine

## 2018-11-05 DIAGNOSIS — R921 Mammographic calcification found on diagnostic imaging of breast: Secondary | ICD-10-CM

## 2019-01-16 ENCOUNTER — Other Ambulatory Visit: Payer: Self-pay

## 2019-01-16 ENCOUNTER — Ambulatory Visit (INDEPENDENT_AMBULATORY_CARE_PROVIDER_SITE_OTHER): Payer: Medicare Other

## 2019-01-16 ENCOUNTER — Ambulatory Visit (INDEPENDENT_AMBULATORY_CARE_PROVIDER_SITE_OTHER): Payer: Medicare Other | Admitting: Internal Medicine

## 2019-01-16 ENCOUNTER — Encounter: Payer: Self-pay | Admitting: Internal Medicine

## 2019-01-16 DIAGNOSIS — Z720 Tobacco use: Secondary | ICD-10-CM | POA: Diagnosis not present

## 2019-01-16 DIAGNOSIS — Z0000 Encounter for general adult medical examination without abnormal findings: Secondary | ICD-10-CM | POA: Diagnosis not present

## 2019-01-16 DIAGNOSIS — E1151 Type 2 diabetes mellitus with diabetic peripheral angiopathy without gangrene: Secondary | ICD-10-CM

## 2019-01-16 DIAGNOSIS — R921 Mammographic calcification found on diagnostic imaging of breast: Secondary | ICD-10-CM

## 2019-01-16 DIAGNOSIS — E1121 Type 2 diabetes mellitus with diabetic nephropathy: Secondary | ICD-10-CM | POA: Diagnosis not present

## 2019-01-16 DIAGNOSIS — R748 Abnormal levels of other serum enzymes: Secondary | ICD-10-CM | POA: Diagnosis not present

## 2019-01-16 DIAGNOSIS — I150 Renovascular hypertension: Secondary | ICD-10-CM

## 2019-01-16 NOTE — Patient Instructions (Signed)
I WILL HAVE JESSICA SCHEDULE YOU A NURSE VISIT AND A LAB VISIT .  PLEASE BRING YOUR BLOOD PRESSURE MACHINE WITH YOU   THE LABS WILL FURTHER EVALUATE YOUR LIVER DISEASE  I HAVE ALSO ORDERED AN ULTRASOUND OF YOUR LIVER

## 2019-01-16 NOTE — Patient Instructions (Addendum)
  Patricia Fields , Thank you for taking time to come for your Medicare Wellness Visit. I appreciate your ongoing commitment to your health goals. Please review the following plan we discussed and let me know if I can assist you in the future.   These are the goals we discussed: Goals      Patient Stated   . Increase physical activity (pt-stated)     Ride 3 wheel bike for exercise       This is a list of the screening recommended for you and due dates:  Health Maintenance  Topic Date Due  . HIV Screening  04/02/1971  . Complete foot exam   03/13/2019  . Flu Shot  03/23/2019  . Hemoglobin A1C  04/15/2019  . Eye exam for diabetics  10/30/2019  . Mammogram  11/02/2019  . Tetanus Vaccine  07/24/2021  . Colon Cancer Screening  03/19/2022  . Pneumococcal vaccine  Completed  .  Hepatitis C: One time screening is recommended by Center for Disease Control  (CDC) for  adults born from [birth year] through 1966.   Completed

## 2019-01-16 NOTE — Progress Notes (Addendum)
Subjective:   Patricia Fields is a 63 y.o. female who presents for Medicare Annual (Subsequent) preventive examination.  Review of Systems:  No ROS.  Medicare Wellness Virtual Visit.  Visual/audio telehealth visit, UTA vital signs.   See social history for additional risk factors. Cardiac Risk Factors include: diabetes mellitus     Objective:     Vitals: There were no vitals taken for this visit.  There is no height or weight on file to calculate BMI.  Advanced Directives 01/16/2019 01/09/2018 12/16/2016 12/08/2016 03/05/2015  Does Patient Have a Medical Advance Directive? Yes No No No No  Type of Estate agent of [place];Living will - - - -  Does patient want to make changes to medical advance directive? No - Patient declined - - - -  Copy of Healthcare Power of Attorney in Chart? Yes - validated most recent copy scanned in chart (See row information) - - - -  Would patient like information on creating a medical advance directive? - No - Patient declined Yes (MAU/Ambulatory/Procedural Areas - Information given) - -    Tobacco Social History   Tobacco Use  Smoking Status Current Every Day Smoker  . Packs/day: 0.50  . Types: Cigarettes  Smokeless Tobacco Never Used     Ready to quit: Not Answered Counseling given: Not Answered   Clinical Intake:  Pre-visit preparation completed: Yes        Diabetes: Yes(Followed by pcp)  How often do you need to have someone help you when you read instructions, pamphlets, or other written materials from your doctor or pharmacy?: 1 - Never  Interpreter Needed?: No     Past Medical History:  Diagnosis Date  . 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 Chronic kidney disease    renal stent  . COPD (chronic obstructive pulmonary disease) (HCC)   . History of nephrolithiasis   . HOH (hard of hearing)   . Hyperlipidemia   . Hypertension   . Interstitial cystitis   . Meniere disease   .  Peripheral arterial disease [facility]) Feb 2011   by renal artery duplex  . Tobacco abuse    one pack daily   Past Surgical History:  Procedure Laterality Date  . ABDOMINAL HYSTERECTOMY    . arthroscopy     left knee , remote  . BREAST EXCISIONAL BIOPSY Left 1980's   NEG  . BREAST SURGERY     left breast cyst, benign  . CARPAL TUNNEL RELEASE     Dr. Hyacinth Meeker,  right hand   . CATARACT EXTRACTION W/PHACO Right 03/05/2015   Procedure: CATARACT EXTRACTION PHACO AND INTRAOCULAR LENS PLACEMENT (IOC);  Surgeon: Lia Hopping, MD;  Location: [facility] ORS;  Service: Ophthalmology;  Laterality: Right;  [place]: 00:52.3 AP%: 15.2 CDE: 7.96  . KNEE ARTHROSCOPY Left   . TOTAL ABDOMINAL HYSTERECTOMY W/ BILATERAL SALPINGOOPHORECTOMY  1992   due to uterine tumor, and endometriosis    Family History  Problem Relation Age of Onset  . Coronary artery disease Mother   . COPD Mother   . Heart disease Mother   . Breast cancer Maternal Grandmother    Social History   Socioeconomic History  . Marital status: Single    Spouse name: Not on file  . Number of children: Not on file  . Years of education: Not on file  . Highest education level: Not on file  Occupational History  . Not on file  Social Needs  . Financial  resource strain: Not hard at all  . Food insecurity:    Worry: Never true    Inability: Never true  . Transportation needs:    Medical: No    Non-medical: No  Tobacco Use  . Smoking status: Current Every Day Smoker    Packs/day: 0.50    Types: Cigarettes  . Smokeless tobacco: Never Used  Substance and Sexual Activity  . Alcohol use: No    Alcohol/week: 0.0 standard drinks  . Drug use: No  . Sexual activity: Not Currently  Lifestyle  . Physical activity:    Days per week: 0 days    Minutes per session: Not on file  . Stress: Only a little  Relationships  . Social connections:    Talks on phone: Not on file    Gets together: Not on file    Attends religious service: Not on file     Active member of club or organization: Not on file    Attends meetings of clubs or organizations: Not on file    Relationship status: Not on file  Other Topics Concern  . Not on file  Social History Narrative  . Not on file    Outpatient Encounter Medications as of 01/16/2019  Medication Sig  . albuterol (PROVENTIL) (2.5 MG/3ML) 0.083% nebulizer solution Take 3 mLs (2.5 mg total) by nebulization every 8 (eight) hours as needed for wheezing or shortness of breath.  . Albuterol Sulfate 108 (90 Base) MCG/ACT AEPB Inhale 1 puff into the lungs every 8 (eight) hours as needed. For wheezing  . amitriptyline (ELAVIL) 25 MG tablet Take 1 tablet (25 mg total) by mouth at bedtime.  [name]. amLODipine (NORVASC) 2.5 MG tablet Take 1 tablet (2.5 mg total) by mouth daily.  [name]. aspirin EC 81 MG tablet Take 1 tablet (81 mg total) by mouth daily.  [name]. atorvastatin (LIPITOR) 40 MG tablet Take 1 tablet (40 mg total) by mouth daily.  [name]. buPROPion (WELLBUTRIN SR) 150 MG 12 hr tablet Take 1 tablet (150 mg total) by mouth 2 (two) times daily.  . Cholecalciferol (VITAMIN D3) 1000 UNITS CAPS Take by mouth.    [name]. glucose blood test strip Use once daily to check blood sugars  E11.9  . Lancets (FREESTYLE) lancets Use once daily to test blood sugars  E11.9  . losartan (COZAAR) 100 MG tablet Take 1 tablet (100 mg total) by mouth daily.  . metoprolol succinate (TOPROL-XL) 50 MG 24 hr tablet Take 1 tablet (50 mg total) by mouth daily. Take with or immediately following a meal.  . ondansetron (ZOFRAN ODT) 4 MG disintegrating tablet Take 1 tablet (4 mg total) by mouth every 8 (eight) hours as needed for nausea or vomiting.  [name]. PARoxetine (PAXIL) 10 MG tablet Take 1 tablet (10 mg total) by mouth every morning.  . pentosan polysulfate (ELMIRON) 100 MG capsule Take 1 capsule (100 mg total) by mouth 3 (three) times daily.  . potassium chloride SA (K-DUR,KLOR-CON) 20 MEQ tablet Take 1 tablet (20 mEq total) by mouth daily.  [name].  sulfamethoxazole-trimethoprim (BACTRIM DS,SEPTRA DS) 800-160 MG tablet Take 1 tablet by mouth 2 (two) times daily.  . varenicline (CHANTIX STARTING MONTH PAK) 0.5 MG X 11 & 1 MG X 42 tablet Take one 0.5 mg tablet by mouth once daily for 3 days, then increase to one 0.5 mg tablet twice daily for 4 days, then increase to one 1 mg tablet twice daily.  . vitamin E 100 UNIT capsule Take 100 Units by  mouth daily.   Facility-Administered Encounter Medications as of 01/16/2019  Medication  . betamethasone acetate-betamethasone sodium phosphate (CELESTONE) injection 3 mg  . ipratropium-albuterol (DUONEB) 0.5-2.5 (3) MG/3ML nebulizer solution 3 mL    Activities of Daily Living In your present state of health, do you have any difficulty performing the following activities: 01/16/2019  Hearing? Y  Comment Hearing aids  Vision? N  Difficulty concentrating or making decisions? N  Walking or climbing stairs? N  Dressing or bathing? N  Doing errands, shopping? 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 N  Using the Toilet? N  In the past six months, have you accidently leaked urine? N  Do you have problems with loss of bowel control? N  Managing your Medications? N  Managing your Finances? N  Housekeeping or managing your Housekeeping? N  Some recent data might be hidden    Patient Care Team: Sherlene Shams, MD as PCP - General (Internal Medicine)    Assessment:   This is a routine wellness examination for [place].  I connected with patient 01/16/19 at 11:00 AM EDT by a audio enabled telemedicine application and verified that I am speaking with the correct person using two identifiers. Patient stated full name and DOB. Patient gave permission to continue with virtual visit. Patient's location was at home and Nurse's location was at [place] office.   Health Screenings  Mammogram - 10/2018 Colonoscopy - 02/2012 Bone Density - discussed Glaucoma -none Hearing -hearing aids  Hemoglobin A1C - 09/2018 Cholesterol - 09/2018 Vision- visits within the last 12 months.  Social  Alcohol intake - no       Smoking history- current at 0.5 pack daily. She is not ready to quit. Smokers in home? none Illicit drug use? none Exercise - none.  Diet - low carb  Sexually Active -not currently BMI- discussed the importance of a healthy diet, water intake and the benefits of aerobic exercise.  Educational material provided.   Safety  Patient feels safe at home- yes Patient does have smoke detectors at home- yes Patient does wear sunscreen or protective clothing when in direct sunlight -yes Patient does wear seat belt when in a moving vehicle -yes Does patient drive -no  [id number] precautions and sickness symptoms discussed.   Activities of Daily Living Patient denies needing assistance with: driving, household chores, feeding themselves, getting from bed to chair, getting to the toilet, bathing/showering, dressing, managing money, or preparing meals.  No new identified risk were noted.    Depression Screen Patient denies losing interest in daily life, feeling hopeless, or crying easily over simple problems.   Medication-taking as directed and without issues.   Fall Screen Patient has no falls since last recorded 6 days ago and without injury. States it was due to vertigo. Followed by pcp.   Memory Screen Patient is alert.  Patient denies difficulty focusing, concentrating or misplacing items. Correctly identified the president of the [place], season and recall. Patient likes to read and plays computer games for brain stimulation.  Immunizations The following Immunizations were discussed: Influenza, shingles, pneumonia, and tetanus.   Other Providers Patient Care Team: Sherlene Shams, MD as PCP - General (Internal Medicine)  Exercise Activities and Dietary recommendations Current Exercise Habits: The patient does not participate in regular exercise at present   Goals      Patient Stated   . Increase physical activity (pt-stated)     Ride 3 wheel bike for exercise  Fall Risk Fall Risk  01/09/2018 12/16/2016 09/19/2016 07/19/2016  Falls in the past year? Yes Yes No Yes  Number falls in past yr: 1 1 - 2 or more  Injury with Fall? No No - No  Risk Factor Category  - - - High Fall Risk  Risk for fall due to : History of fall(s) - - -  Follow up Education provided;Falls prevention discussed Falls prevention discussed;Education provided - -   Depression Screen PHQ 2/9 Scores 01/16/2019 01/09/2018 12/16/2016 09/19/2016  PHQ - 2 Score 0 0 0 0  PHQ- 9 Score - - 0 -     Cognitive Function MMSE - Mini Mental State Exam 12/16/2016  Orientation to time 5  Orientation to Place 5  Registration 3  Attention/ Calculation 5  Recall 3  Language- name 2 objects 2  Language- repeat 1  Language- follow 3 step command 3  Language- read & follow direction 1  Write a sentence 1  Copy design 1  Total score 30     6CIT Screen 01/16/2019 01/09/2018  What Year? 0 points 0 points  What month? 0 points 0 points  What time? 0 points 0 points  Count back from 20 0 points 0 points  Months in reverse 0 points 0 points  Repeat phrase 0 points 0 points  Total Score 0 0    Immunization History  Administered Date(s) Administered  . Influenza Split 07/25/2011, 09/27/2012  . Influenza,inj,Quad PF,6+ Mos 05/29/2013, 08/14/2014, 11/06/2015, 09/19/2016, 07/12/2018  . Pneumococcal Conjugate-13 05/29/2013  . Pneumococcal Polysaccharide-23 08/14/2014  . Tdap 07/25/2011  . Zoster 08/20/2014   Screening Tests Health Maintenance  Topic Date Due  . HIV Screening  04[birth year]1970  . FOOT EXAM  03/13/2019  . INFLUENZA VACCINE  03/23/2019  . HEMOGLOBIN A1C  04/15/2019  . OPHTHALMOLOGY EXAM  10/30/2019  . MAMMOGRAM  11/02/2019  . TETANUS/TDAP  07/24/2021  . COLONOSCOPY  03/19/2022  . PNEUMOCOCCAL POLYSACCHARIDE VACCINE AGE 59-64 HIGH RISK  Completed  . Hepatitis C  Screening  Completed      Plan:    End of life planning; Advance aging; Advanced directives discussed.  Copy of current HCPOA/Living Will on file.    I have personally reviewed and noted the following in the patient's chart:   . Medical and social history . Use of alcohol, tobacco or illicit drugs  . Current medications and supplements . Functional ability and status . Nutritional status . Physical activity . Advanced directives . List of other physicians . Hospitalizations, surgeries, and ER visits in previous 12 months . Vitals . Screenings to include cognitive, depression, and falls . Referrals and appointments  In addition, I have reviewed and discussed with patient certain preventive protocols, quality metrics, and best practice recommendations. A written personalized care plan for preventive services as well as general preventive health recommendations were provided to patient.   Time spent on audio 25 minutes.  OBrien-Blaney, Denisa L, LPN  1/61/0960    I have reviewed the above information and agree with above.   Duncan Dull, MD

## 2019-01-16 NOTE — Progress Notes (Signed)
Virtual Visit converted to telephone  Note  This visit type was conducted due to national recommendations for restrictions regarding the COVID-19 pandemic (e.g. social distancing).  This format is felt to be most appropriate for this patient at this time.  All issues noted in this document were discussed and addressed.  No physical exam was performed (except for noted visual exam findings with Video Visits).   I attempted to connect with@ on 01/16/19 at 10:30 AM EDT by a video enabled telemedicine application ; however  Interactive audio and video telecommunications  Ultimately failed, due to patient having technical difficulties.   We continued and completed visit with audio only. I and verified that I am speaking with the correct person using two identifiers. Location patient: home Location provider: work or home office Persons participating in the virtual visit: patient, provider  I discussed the limitations, risks, security and privacy concerns of performing an evaluation and management service by telephone and the availability of in person appointments. I also discussed with the patient that there may be a patient responsible charge related to this service. The patient expressed understanding and agreed to proceed.  Reason for visit: follow up on type 2 DM with PAD, hypertension, hyperlipidemia :  HPI:  63 yr old female with type 2 DM, PAD, hypertension and tobacco abuse here for 3 month follow up   The patient has no signs or symptoms of COVID 19 infection (fever, cough, sore throat  or shortness of breath beyond what is typical for patient).  Patient denies contact with other persons with the above mentioned symptoms or with anyone confirmed to have COVID 19 .  She has been minimizing her contact with the public and using a mask and hand sanitizer when she comes into any contact with the public.   3 month follow up on type 2 dm.  In general she states that she feels very good.  Sugars are  under 120 persistently when she checks them in the morning fasting.   Patient has no complaints today.  Patient is following a low glycemic index diet and taking all prescribed medications regularly without side effects.   Patient is walking for 20 minutes about 3 times per week and intentionally trying to lose weight .  Patient has had an eye exam in the last 12 months and checks feet regularly for signs of infection.  Patient does not walk barefoot outside,  And denies any numbness tingling or burning in feet. Patient is up to date on all recommended vaccinations  THN: She has purchased a new bp monitor  Readings have been elevated  despite taking her medications as advised.  And have been as high as 179/89  ,  204 /100  Tobacco abuse:  Risks of continued tobacco use were discussed. She is not currently interested in tobacco cessation.   ELEVATED ALK PHOSPHATASE: discussed potential causes and reviewed prior workup, if any . She has had no symptoms of nausea or RUQ pain . No previus imaging of abdomen except plain films    ROS: See pertinent positives and negatives per HPI.  Past Medical History:  Diagnosis Date  . 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 Chronic kidney disease    renal stent  . COPD (chronic obstructive pulmonary disease) ([place])   . History of nephrolithiasis   . HOH (hard of hearing)   . Hyperlipidemia   . Hypertension   . Interstitial cystitis   . Meniere disease   .  Peripheral arterial disease [facility]) Feb 2011   by renal artery duplex  . Tobacco abuse    one pack daily    Past Surgical History:  Procedure Laterality Date  . ABDOMINAL HYSTERECTOMY    . arthroscopy     left knee , remote  . BREAST EXCISIONAL BIOPSY Left 1980's   NEG  . BREAST SURGERY     left breast cyst, benign  . CARPAL TUNNEL RELEASE     Dr. Sabra Heck,  right hand   . CATARACT EXTRACTION W/PHACO Right 03/05/2015   Procedure: CATARACT EXTRACTION PHACO AND INTRAOCULAR  LENS PLACEMENT (IOC);  Surgeon: Lyla Glassing, MD;  Location: [facility] ORS;  Service: Ophthalmology;  Laterality: Right;  [place]: 00:52.3 AP%: 15.2 CDE: 7.96  . KNEE ARTHROSCOPY Left   . TOTAL ABDOMINAL HYSTERECTOMY W/ BILATERAL SALPINGOOPHORECTOMY  1992   due to uterine tumor, and endometriosis     Family History  Problem Relation Age of Onset  . Coronary artery disease Mother   . COPD Mother   . Heart disease Mother   . Breast cancer Maternal Grandmother     SOCIAL HX:  reports that she has been smoking cigarettes. She has been smoking about 0.50 packs per day. She has never used smokeless tobacco. She reports that she does not drink alcohol or use drugs.   Current Outpatient Medications:  .  albuterol (PROVENTIL) (2.5 MG/3ML) 0.083% nebulizer solution, Take 3 mLs (2.5 mg total) by nebulization every 8 (eight) hours as needed for wheezing or shortness of breath., Disp: 150 mL, Rfl: 1 .  Albuterol Sulfate 108 (90 Base) MCG/ACT AEPB, Inhale 1 puff into the lungs every 8 (eight) hours as needed. For wheezing, Disp: 3 each, Rfl: 0 .  amitriptyline (ELAVIL) 25 MG tablet, Take 1 tablet (25 mg total) by mouth at bedtime., Disp: 30 tablet, Rfl: 1 .  amLODipine (NORVASC) 2.5 MG tablet, Take 1 tablet (2.5 mg total) by mouth daily., Disp: 90 tablet, Rfl: 1 .  aspirin EC 81 MG tablet, Take 1 tablet (81 mg total) by mouth daily., Disp: 90 tablet, Rfl: 2 .  atorvastatin (LIPITOR) 40 MG tablet, Take 1 tablet (40 mg total) by mouth daily., Disp: 90 tablet, Rfl: 2 .  buPROPion (WELLBUTRIN SR) 150 MG 12 hr tablet, Take 1 tablet (150 mg total) by mouth 2 (two) times daily., Disp: 180 tablet, Rfl: 1 .  Cholecalciferol (VITAMIN D3) 1000 UNITS CAPS, Take by mouth.  , Disp: , Rfl:  .  glucose blood test strip, Use once daily to check blood sugars  E11.9, Disp: 100 each, Rfl: 3 .  Lancets (FREESTYLE) lancets, Use once daily to test blood sugars  E11.9, Disp: 100 each, Rfl: 3 .  losartan (COZAAR) 100 MG tablet,  Take 1 tablet (100 mg total) by mouth daily., Disp: 90 tablet, Rfl: 1 .  metoprolol succinate (TOPROL-XL) 50 MG 24 hr tablet, Take 1 tablet (50 mg total) by mouth daily. Take with or immediately following a meal., Disp: 90 tablet, Rfl: 1 .  ondansetron (ZOFRAN ODT) 4 MG disintegrating tablet, Take 1 tablet (4 mg total) by mouth every 8 (eight) hours as needed for nausea or vomiting., Disp: 60 tablet, Rfl: 2 .  PARoxetine (PAXIL) 10 MG tablet, Take 1 tablet (10 mg total) by mouth every morning., Disp: 90 tablet, Rfl: 1 .  pentosan polysulfate (ELMIRON) 100 MG capsule, Take 1 capsule (100 mg total) by mouth 3 (three) times daily., Disp: 90 capsule, Rfl: 1 .  potassium chloride SA (  K-DUR,KLOR-CON) 20 MEQ tablet, Take 1 tablet (20 mEq total) by mouth daily., Disp: 10 tablet, Rfl: 0 .  sulfamethoxazole-trimethoprim (BACTRIM DS,SEPTRA DS) 800-160 MG tablet, Take 1 tablet by mouth 2 (two) times daily., Disp: 14 tablet, Rfl: 0 .  varenicline (CHANTIX STARTING MONTH PAK) 0.5 MG X 11 & 1 MG X 42 tablet, Take one 0.5 mg tablet by mouth once daily for 3 days, then increase to one 0.5 mg tablet twice daily for 4 days, then increase to one 1 mg tablet twice daily., Disp: 53 tablet, Rfl: 0 .  vitamin E 100 UNIT capsule, Take 100 Units by mouth daily., Disp: , Rfl:   Current Facility-Administered Medications:  .  betamethasone acetate-betamethasone sodium phosphate (CELESTONE) injection 3 mg, 3 mg, Intramuscular, Once, Evans, Brent M, DPM .  ipratropium-albuterol (DUONEB) 0.5-2.5 (3) MG/3ML nebulizer solution 3 mL, 3 mL, Nebulization, Q6H, Arnett, Yvetta Coder, FNP  EXAM:   General impression: alert, cooperative and articulate.  No signs of being in distress  Lungs: speech is fluent sentence length suggests that patient is not short of breath and not punctuated by cough, sneezing or sniffing. [name]   Psych: affect normal.  speech is articulate and non pressured .  Denies suicidal thoughts   ASSESSMENT AND  PLAN:  Discussed the following assessment and plan:  DM (diabetes mellitus), type 2 with peripheral vascular complications ([place]) - Plan: Hemoglobin A1c, Lipid panel  Elevated liver enzymes - Plan: Comprehensive metabolic panel  Elevated alkaline phosphatase measurement - Plan: US Abdomen Limited RUQ  Tobacco abuse  Diabetic nephropathy with proteinuria (HCC)  Elevated alkaline phosphatase level  Breast calcifications on mammogram  Renovascular hypertension  Tobacco abuse She is having trouble quitting but has reduced her intake.  Previous trials of chantix discussed .  She does not want to repeat . Spent 3 minutes discussing risk of continued tobacco abuse, including but not limited to CAD, PAD, hypertension, and CA.  He is not interested in pharmacotherapy at this time.  Diabetic nephropathy with proteinuria (HCC) Continue losartan for mitigation of nephropathy  Elevated alkaline phosphatase level Persistent .  Shas been reminded to return for additional serologies that were ordered in February to rule out autoimmune and viral etiologies .  U/s of liver also ordered  Hepatitis Latest Ref Rng & Units 08/14/2014  Hep C Ab NEGATIVE NEGATIVE     DM (diabetes mellitus), type 2 with peripheral vascular complications ([place]) Diagnosed in April 2018.  Thus far well managed with  diet .  Smoking cessation strongly  Advised and in progress.  Continue  baby aspirin.  Continue lipitor, losartan.  Repeat labs pending  Diet reviewed in detail and she has been using her one touch glucometer with fastings staying < 120  Lab Results  Component Value Date   HGBA1C 6.1 10/15/2018   Lab Results  Component Value Date   ALT 15 10/15/2018   AST 14 10/15/2018   ALKPHOS 122 (H) 10/15/2018   BILITOT 0.3 10/15/2018   Lab Results  Component Value Date   CREATININE 1.05 10/15/2018   Lab Results  Component Value Date   MICROALBUR 28.0 (H) 07/12/2018     Breast calcifications on  mammogram Benign coarse calcifications in the right breast were seen on March 2020 diagnostic mamogram.  6 month follow up diagnostic mammogram has been ordered for September 2020   Renovascular hypertension Elevated home readings noted at home;  Previous office readings were also high due to lapse in medications. She is being  scheduled for an RN visit to assess accuracy of home monitor .  Currently taking 2.5 mg amlodipine and 100 mg losartan daily   Lab Results  Component Value Date   CREATININE 1.05 10/15/2018   Lab Results  Component Value Date   NA 143 10/15/2018   K 4.1 10/15/2018   CL 106 10/15/2018   CO2 29 10/15/2018       I discussed the assessment and treatment plan with the patient. The patient was provided an opportunity to ask questions and all were answered. The patient agreed with the plan and demonstrated an understanding of the instructions.   The patient was advised to call back or seek an in-person evaluation if the symptoms worsen or if the condition fails to improve as anticipated.  I provided 30 minutes of non-face-to-face time during this encounter.   Crecencio Mc, MD

## 2019-01-16 NOTE — Progress Notes (Signed)
Pt stated that her blood pressure has been running but stated that she got new wrist cuff and isn't sure if it is working correctly.

## 2019-01-19 ENCOUNTER — Encounter: Payer: Self-pay | Admitting: Internal Medicine

## 2019-01-19 NOTE — Assessment & Plan Note (Signed)
Elevated home readings noted at home;  Previous office readings were also high due to lapse in medications. She is being scheduled for an RN visit to assess accuracy of home monitor .  Currently taking 2.5 mg amlodipine and 100 mg losartan daily   Lab Results  Component Value Date   CREATININE 1.05 10/15/2018   Lab Results  Component Value Date   NA 143 10/15/2018   K 4.1 10/15/2018   CL 106 10/15/2018   CO2 29 10/15/2018

## 2019-01-19 NOTE — Assessment & Plan Note (Addendum)
Persistent .  Shas been reminded to return for additional serologies that were ordered in February to rule out autoimmune and viral etiologies .  U/s of liver also ordered  Hepatitis Latest Ref Rng & Units 08/14/2014  Hep C Ab NEGATIVE NEGATIVE

## 2019-01-19 NOTE — Assessment & Plan Note (Signed)
Diagnosed in April 2018.  Thus far well managed with  diet .  Smoking cessation strongly  Advised and in progress.  Continue  baby aspirin.  Continue lipitor, losartan.  Repeat labs pending  Diet reviewed in detail and she has been using her one touch glucometer with fastings staying < 120  Lab Results  Component Value Date   HGBA1C 6.1 10/15/2018   Lab Results  Component Value Date   ALT 15 10/15/2018   AST 14 10/15/2018   ALKPHOS 122 (H) 10/15/2018   BILITOT 0.3 10/15/2018   Lab Results  Component Value Date   CREATININE 1.05 10/15/2018   Lab Results  Component Value Date   MICROALBUR 28.0 (H) 07/12/2018

## 2019-01-19 NOTE — Assessment & Plan Note (Addendum)
She is having trouble quitting but has reduced her intake.  Previous trials of chantix discussed .  She does not want to repeat . Spent 3 minutes discussing risk of continued tobacco abuse, including but not limited to CAD, PAD, hypertension, and CA.  He is not interested in pharmacotherapy at this time.

## 2019-01-19 NOTE — Assessment & Plan Note (Addendum)
Benign coarse calcifications in the right breast were seen on March 2020 diagnostic mamogram.  6 month follow up diagnostic mammogram has been ordered for September 2020

## 2019-01-19 NOTE — Assessment & Plan Note (Signed)
Continue losartan for mitigation of nephropathy

## 2019-01-22 NOTE — Progress Notes (Signed)
Attempted to call pt. No answer and mailbox was full.

## 2019-01-24 ENCOUNTER — Other Ambulatory Visit (INDEPENDENT_AMBULATORY_CARE_PROVIDER_SITE_OTHER): Payer: Medicare Other

## 2019-01-24 ENCOUNTER — Other Ambulatory Visit: Payer: Self-pay

## 2019-01-24 DIAGNOSIS — R748 Abnormal levels of other serum enzymes: Secondary | ICD-10-CM

## 2019-01-24 DIAGNOSIS — E1151 Type 2 diabetes mellitus with diabetic peripheral angiopathy without gangrene: Secondary | ICD-10-CM | POA: Diagnosis not present

## 2019-01-24 LAB — LIPID PANEL
Cholesterol: 188 mg/dL (ref 0–200)
HDL: 52.2 mg/dL (ref >39.00)
LDL Cholesterol: 115 mg/dL — ABNORMAL HIGH (ref 0–99)
NonHDL: 135.39
Total CHOL/HDL Ratio: 4
Triglycerides: 100 mg/dL (ref 0.0–149.0)
VLDL: 20 mg/dL (ref 0.0–40.0)

## 2019-01-24 LAB — COMPREHENSIVE METABOLIC PANEL
ALT: 13 U/L (ref 0–35)
AST: 12 U/L (ref 0–37)
Albumin: 4 g/dL (ref 3.5–5.2)
Alkaline Phosphatase: 117 U/L (ref 39–117)
BUN: 15 mg/dL (ref 6–23)
CO2: 27 mEq/L (ref 19–32)
Calcium: 9.2 mg/dL (ref 8.4–10.5)
Chloride: 106 mEq/L (ref 96–112)
Creatinine, Ser: 0.87 mg/dL (ref 0.40–1.20)
GFR: 65.59 mL/min (ref >60.00)
Glucose, Bld: 97 mg/dL (ref 70–99)
Potassium: 3.7 mEq/L (ref 3.5–5.1)
Sodium: 142 mEq/L (ref 135–145)
Total Bilirubin: 0.5 mg/dL (ref 0.2–1.2)
Total Protein: 6.7 g/dL (ref 6.0–8.3)

## 2019-01-24 LAB — IBC + FERRITIN
Ferritin: 41.2 ng/mL (ref 10.0–291.0)
Iron: 83 ug/dL (ref 42–145)
Saturation Ratios: 24.7 % (ref 20.0–50.0)
Transferrin: 240 mg/dL (ref 212.0–360.0)

## 2019-01-24 LAB — HEMOGLOBIN A1C: Hgb A1c MFr Bld: 6.2 % (ref 4.6–6.5)

## 2019-01-24 NOTE — Addendum Note (Signed)
Addended by: Warden Fillers on: 01/24/2019 10:50 AM   Modules accepted: Orders

## 2019-01-26 LAB — HEPATITIS C ANTIBODY
Hepatitis C Ab: NONREACTIVE
SIGNAL TO CUT-OFF: 0.01 (ref <1.00)

## 2019-01-26 LAB — HEPATITIS B SURFACE ANTIBODY,QUALITATIVE: Hep B S Ab: NONREACTIVE

## 2019-01-26 LAB — ANA: Anti Nuclear Antibody (ANA): NEGATIVE

## 2019-01-26 LAB — ALKALINE PHOSPHATASE, ISOENZYMES
Alkaline Phosphatase: 130 IU/L — ABNORMAL HIGH (ref 39–117)
BONE FRACTION: 30 % (ref 14–68)
INTESTINAL FRAC.: 5 % (ref 0–18)
LIVER FRACTION: 65 % (ref 18–85)

## 2019-01-26 LAB — ANTI-SMITH ANTIBODY: ENA SM Ab Ser-aCnc: <1 AI

## 2019-01-26 LAB — MITOCHONDRIAL ANTIBODIES: Mitochondrial M2 Ab, IgG: <=20 U

## 2019-01-26 LAB — HEPATITIS B CORE ANTIBODY, TOTAL: Hep B Core Total Ab: NONREACTIVE

## 2019-01-26 LAB — HEPATITIS B SURFACE ANTIGEN: Hepatitis B Surface Ag: NONREACTIVE

## 2019-01-29 NOTE — Progress Notes (Signed)
Spoke with pt and she stated that she would [place] back later today to schedule the nurse visit because she will have to find someone to bring her to the office.

## 2019-02-01 ENCOUNTER — Ambulatory Visit: Payer: Medicare Other

## 2019-02-01 DIAGNOSIS — H25812 Combined forms of age-related cataract, left eye: Secondary | ICD-10-CM | POA: Diagnosis not present

## 2019-02-01 DIAGNOSIS — Z01818 Encounter for other preprocedural examination: Secondary | ICD-10-CM | POA: Diagnosis not present

## 2019-02-01 DIAGNOSIS — E119 Type 2 diabetes mellitus without complications: Secondary | ICD-10-CM | POA: Diagnosis not present

## 2019-02-01 DIAGNOSIS — Z961 Presence of intraocular lens: Secondary | ICD-10-CM | POA: Diagnosis not present

## 2019-02-06 ENCOUNTER — Ambulatory Visit: Admission: RE | Admit: 2019-02-06 | Payer: Medicare Other | Source: Ambulatory Visit

## 2019-02-06 ENCOUNTER — Ambulatory Visit: Payer: Medicare Other

## 2019-02-07 ENCOUNTER — Telehealth: Payer: Self-pay

## 2019-02-07 NOTE — Telephone Encounter (Signed)
Copied from [place] [id number]. Topic: Appointment Scheduling - Scheduling Inquiry for Clinic >> Feb 07, 2019 11:26 AM Erick Blinks wrote: Reason for CRM: Pt wants to be rescheduled for missed appt. Please advise. Tried calling office.

## 2019-02-08 NOTE — Telephone Encounter (Signed)
Spoke with pt and she has been rescheduled for the nurse visit. Pt is aware of appt date and time.

## 2019-02-13 ENCOUNTER — Other Ambulatory Visit: Payer: Self-pay

## 2019-02-13 ENCOUNTER — Ambulatory Visit (INDEPENDENT_AMBULATORY_CARE_PROVIDER_SITE_OTHER): Payer: Medicare Other | Admitting: Family Medicine

## 2019-02-13 ENCOUNTER — Encounter: Payer: Self-pay | Admitting: Family Medicine

## 2019-02-13 ENCOUNTER — Ambulatory Visit: Payer: Medicare Other

## 2019-02-13 DIAGNOSIS — I150 Renovascular hypertension: Secondary | ICD-10-CM | POA: Diagnosis not present

## 2019-02-13 MED ORDER — AMLODIPINE BESYLATE 5 MG PO TABS: 5.0000 mg | ORAL_TABLET | Freq: Every day | ORAL | 1 refills | Status: DC

## 2019-02-13 MED ORDER — METOPROLOL SUCCINATE ER 50 MG PO TB24: 50.0000 mg | ORAL_TABLET | Freq: Every day | ORAL | 1 refills | Status: DC

## 2019-02-13 NOTE — Patient Instructions (Signed)
Nice to see you. We will increase your amlodipine to 5 mg daily. Please continue with your other BP medications.  If you develop chest pain, trouble breathing, vision changes, headaches, or any new symptoms please seek medical attention immediately.

## 2019-02-13 NOTE — Assessment & Plan Note (Signed)
Uncontrolled.  Blood pressure has been elevated to this degree for quite some time now.  She is asymptomatic.  She had lab work about 3 weeks ago that did not reveal any kidney dysfunction to indicate endorgan damage of the kidneys.  Benign exam today.  We will increase her amlodipine to 5 mg daily.  She will continue her metoprolol and losartan.  I will have her follow-up with her PCP in 2 weeks.  She is given reasons to seek medical attention in the emergency department.

## 2019-02-13 NOTE — Progress Notes (Signed)
  Tommi Rumps, MD Phone: [phone]  Patricia Fields is a 63 y.o. female who presents today for same day visit.   HYPERTENSION  Disease Monitoring  Home BP Monitoring typically in the 170s/80s at home, has had BPs up to 204/102 at home, BP has been in this range for months if not longer  Chest pain- no    Dyspnea- no Medications  Compliance-  Taking losartan, amlodipine, metoprolol.  Edema- no Denies headaches, numbness, weakness, and vision changes. She notes she feels quite well. She reports she has a cataract in her left eye that is being followed by optho.  She notes no decongestant use or NSAID use.  No medication changes per patient report.    Social History   Tobacco Use  Smoking Status Current Every Day Smoker  . Packs/day: 0.50  . Types: Cigarettes  Smokeless Tobacco Never Used     ROS see history of present illness  Objective  Physical Exam Vitals:   02/13/19 1135  BP: (!) 186/102  Pulse: 90  Resp: 16  SpO2: 98%    BP Readings from Last 3 Encounters:  02/13/19 (!) 186/102  10/15/18 (!) 178/86  07/12/18 (!) 154/98   Wt Readings from Last 3 Encounters:  02/13/19 158 lb 6 oz (71.8 kg)  10/15/18 161 lb 12.8 oz (73.4 kg)  07/12/18 171 lb 9.6 oz (77.8 kg)    Physical Exam Constitutional:      General: She is not in acute distress.    Appearance: She is not diaphoretic.  Eyes:     Pupils: Pupils are equal, round, and reactive to light.     Comments: Cataract in left eye noted on attempted fundus exam, fundus exam difficult to complete on the right given pupillary constriction  Cardiovascular:     Rate and Rhythm: Normal rate and regular rhythm.     Heart sounds: Normal heart sounds.  Pulmonary:     Effort: Pulmonary effort is normal.     Breath sounds: Normal breath sounds.  Musculoskeletal:     Right lower leg: No edema.     Left lower leg: No edema.  Skin:    General: Skin is warm and dry.  Neurological:     Mental Status: She is alert.   Comments: Patient is able to stand from the chair and get on the exam table with no difficulty, she moves all extremities equally      Assessment/Plan: Please see individual problem list.  Renovascular hypertension Uncontrolled.  Blood pressure has been elevated to this degree for quite some time now.  She is asymptomatic.  She had lab work about 3 weeks ago that did not reveal any kidney dysfunction to indicate endorgan damage of the kidneys.  Benign exam today.  We will increase her amlodipine to 5 mg daily.  She will continue her metoprolol and losartan.  I will have her follow-up with her PCP in 2 weeks.  She is given reasons to seek medical attention in the emergency department.   No orders of the defined types were placed in this encounter.   Meds ordered this encounter  Medications  . amLODipine (NORVASC) 5 MG tablet    Sig: Take 1 tablet (5 mg total) by mouth daily.    Dispense:  90 tablet    Refill:  1     Tommi Rumps, MD [place]

## 2019-02-14 ENCOUNTER — Ambulatory Visit: Payer: Self-pay | Admitting: Internal Medicine

## 2019-02-14 NOTE — Telephone Encounter (Signed)
Pt. Reports she has left sided abdominal pain at her rib cage. Pain is 8/10. Missed her Ultrasound appointment 2 weeks ago. Instructed pt. To go to ED with her high pain score. Verbalizes understanding.  Reason for Disposition . [1] SEVERE pain AND [2] age > 25  Answer Assessment - Initial Assessment Questions 1. LOCATION: "Where does it hurt?"      Left side by ribs 2. RADIATION: "Does the pain shoot anywhere else?" (e.g., chest, back)     No 3. ONSET: "When did the pain begin?" (e.g., minutes, hours or days ago)      2 days 4. SUDDEN: "Gradual or sudden onset?"     Getting worse 5. PATTERN "Does the pain come and go, or is it constant?"    - If constant: "Is it getting better, staying the same, or worsening?"      (Note: Constant means the pain never goes away completely; most serious pain is constant and it progresses)     - If intermittent: "How long does it last?" "Do you have pain now?"     (Note: Intermittent means the pain goes away completely between bouts)     Constant 6. SEVERITY: "How bad is the pain?"  (e.g., Scale 1-10; mild, moderate, or severe)   - MILD (1-3): doesn't interfere with normal activities, abdomen soft and not tender to touch    - MODERATE (4-7): interferes with normal activities or awakens from sleep, tender to touch    - SEVERE (8-10): excruciating pain, doubled over, unable to do any normal activities      8-9 7. RECURRENT SYMPTOM: "Have you ever had this type of abdominal pain before?" If so, ask: "When was the last time?" and "What happened that time?"      On and off x 2 months 8. CAUSE: "What do you think is causing the abdominal pain?"     Unsure 9. RELIEVING/AGGRAVATING FACTORS: "What makes it better or worse?" (e.g., movement, antacids, bowel movement)     No 10. OTHER SYMPTOMS: "Has there been any vomiting, diarrhea, constipation, or urine problems?"       No 11. PREGNANCY: "Is there any chance you are pregnant?" "When was your last menstrual  period?"       No  Protocols used: ABDOMINAL PAIN - [facility]

## 2019-02-20 ENCOUNTER — Other Ambulatory Visit: Payer: Self-pay

## 2019-02-25 ENCOUNTER — Ambulatory Visit: Payer: Self-pay | Admitting: Internal Medicine

## 2019-02-25 NOTE — Telephone Encounter (Signed)
Patient says Metoprolol XL increased 1 week ago and was told to notify PCP if having swelling if feet and legs, she can still get shoes on but has swelling in feet and ankles bilateral . NO SOB or chest pain.

## 2019-02-25 NOTE — Telephone Encounter (Signed)
Pt. Reports her Toprol XL was increased 1 week ago. Noticed Sat. Her feet and ankles were swollen. Can still put her shoes on. Reports she was told if she developed swelling to notify her PCP. Denies any shortness of breath or chest pain. Held her dose today. Please advise pt.  Answer Assessment - Initial Assessment Questions 1. ONSET: "When did the swelling start?" (e.g., minutes, hours, days)     Sat. 2. LOCATION: "What part of the leg is swollen?"  "Are both legs swollen or just one leg?"     Both feet and ankles 3. SEVERITY: "How bad is the swelling?" (e.g., localized; mild, moderate, severe)  - Localized - small area of swelling localized to one leg  - MILD pedal edema - swelling limited to foot and ankle, pitting edema < 1/4 inch (6 mm) deep, rest and elevation eliminate most or all swelling  - MODERATE edema - swelling of lower leg to knee, pitting edema > 1/4 inch (6 mm) deep, rest and elevation only partially reduce swelling  - SEVERE edema - swelling extends above knee, facial or hand swelling present      Mild  4. REDNESS: "Does the swelling look red or infected?"     No 5. PAIN: "Is the swelling painful to touch?" If so, ask: "How painful is it?"   (Scale 1-10; mild, moderate or severe)     No 6. FEVER: "Do you have a fever?" If so, ask: "What is it, how was it measured, and when did it start?"      No 7. CAUSE: "What do you think is causing the leg swelling?"     Maybe the medicine - Toprol XL 8. MEDICAL HISTORY: "Do you have a history of heart failure, kidney disease, liver failure, or cancer?"     No 9. RECURRENT SYMPTOM: "Have you had leg swelling before?" If so, ask: "When was the last time?" "What happened that time?"     No 10. OTHER SYMPTOMS: "Do you have any other symptoms?" (e.g., chest pain, difficulty breathing)       No 11. PREGNANCY: "Is there any chance you are pregnant?" "When was your last menstrual period?"       No  Protocols used: LEG SWELLING AND  EDEMA-A-AH

## 2019-02-25 NOTE — Telephone Encounter (Signed)
She needs a telephone  or virtual visit to follow up on blood pressure issues and medication changes  made by dr Caryl Bis 2 weeks ago . The amlodipine is causing the edema , not the metoprolol

## 2019-02-25 NOTE — Telephone Encounter (Signed)
Attempted to call pt no answer no voicemail.  

## 2019-02-26 NOTE — Telephone Encounter (Signed)
Tried to reach patient by phone no voicemail will continue to try to reach patient have sent MY chart asking patient to call.

## 2019-02-26 NOTE — Telephone Encounter (Signed)
Tried to reach patient by phone both home and cell unable to leave message to [place] office, sent My chart message .

## 2019-02-26 NOTE — Telephone Encounter (Signed)
Patient taking losartan 100 mg daily and voiced understanding to increase metoprolol to 100 mg daily with additional 50 mg today. Patient has scheduled nurse visit Thursday for BP check and has FU scheduled with PCP for 03/04/19 .[name]

## 2019-02-26 NOTE — Addendum Note (Signed)
Addended by: Crecencio Mc on: 02/26/2019 11:37 AM   Modules accepted: Orders

## 2019-02-26 NOTE — Telephone Encounter (Signed)
increase metoprolol dose to 100 mg daily starting today.   Take 50 mg now,  Confirm that she is taking 100 mg losartan as well .  If she is not,  Find out why she has stopped it. .  If she  Has already taken the losartan ,  please schedule an RN visit for Thursday  to check BP

## 2019-02-26 NOTE — Telephone Encounter (Signed)
Patient called back Virtual scheduled for Thursday , patient meant to  Say the amlodipine at call yesterday just became confused on phone, patient held amlodipine last night on her own. Had patient take BP while on phone BP  200/67 pulse is 88  This morning, no meds yet today patient taking the metoprolol now, please advise as to amlodipine because swelling is gone with holding the amlodipine.

## 2019-02-27 ENCOUNTER — Ambulatory Visit
Admission: RE | Admit: 2019-02-27 | Discharge: 2019-02-27 | Disposition: A | Discharge status: Home / Self Care | Payer: Medicare Other | Source: Ambulatory Visit | Attending: Internal Medicine | Admitting: Internal Medicine

## 2019-02-27 ENCOUNTER — Other Ambulatory Visit: Payer: Self-pay

## 2019-02-27 DIAGNOSIS — R748 Abnormal levels of other serum enzymes: Secondary | ICD-10-CM | POA: Diagnosis not present

## 2019-02-28 ENCOUNTER — Ambulatory Visit: Payer: Medicare Other | Admitting: Internal Medicine

## 2019-02-28 ENCOUNTER — Ambulatory Visit (INDEPENDENT_AMBULATORY_CARE_PROVIDER_SITE_OTHER): Payer: Medicare Other

## 2019-02-28 VITALS — BP 140/90 | HR 73

## 2019-02-28 DIAGNOSIS — I10 Essential (primary) hypertension: Secondary | ICD-10-CM | POA: Diagnosis not present

## 2019-02-28 NOTE — Progress Notes (Signed)
Reviewed above information.  Blood pressure (nurse check) 140/90.  Her cuff reading a lot higher.  Reviewed chart.  Dr Derrel Nip just increased metoprolol to 100mg  q day - 02/26/19.  Since metoprolol just increased, will hold on making changes in medication today.  Has appt with Dr Derrel Nip 03/04/19.

## 2019-02-28 NOTE — Progress Notes (Signed)
Patient here for nurse visit BP check per order from MD. (see phone note on 02/25/19)  Patient reports compliance with prescribed BP medications: YES  Last dose of BP medication: This morning at 8:00 am.  Patient's BP was checked a 2nd time at 10:40 am.  BP:  140/90  Patient checked her blood pressure in right arm with her machine from home:  BP reading was 186/71 and HR was 75  BP Readings from Last 3 Encounters:  02/28/19 (!) 150/100  02/13/19 (!) 186/102  10/15/18 (!) 178/86   Pulse Readings from Last 3 Encounters:  02/28/19 73  02/13/19 90  10/15/18 98

## 2019-03-01 ENCOUNTER — Other Ambulatory Visit: Payer: Self-pay | Admitting: Internal Medicine

## 2019-03-01 DIAGNOSIS — R748 Abnormal levels of other serum enzymes: Secondary | ICD-10-CM

## 2019-03-01 NOTE — Progress Notes (Unsigned)
Referral to [facility]

## 2019-03-04 ENCOUNTER — Ambulatory Visit: Payer: Medicare Other | Admitting: Internal Medicine

## 2019-03-04 DIAGNOSIS — Z0289 Encounter for other administrative examinations: Secondary | ICD-10-CM

## 2019-03-07 ENCOUNTER — Telehealth: Payer: Self-pay | Admitting: Internal Medicine

## 2019-03-07 ENCOUNTER — Ambulatory Visit (INDEPENDENT_AMBULATORY_CARE_PROVIDER_SITE_OTHER): Payer: Medicare Other | Admitting: Family Medicine

## 2019-03-07 ENCOUNTER — Other Ambulatory Visit: Payer: Self-pay

## 2019-03-07 DIAGNOSIS — M7989 Other specified soft tissue disorders: Secondary | ICD-10-CM

## 2019-03-07 DIAGNOSIS — I150 Renovascular hypertension: Secondary | ICD-10-CM | POA: Diagnosis not present

## 2019-03-07 NOTE — Telephone Encounter (Signed)
Called Pt and scheduled her for 03/11/19 @ 2:00pm

## 2019-03-07 NOTE — Telephone Encounter (Signed)
OK thanks. If anything worsens or changes she has been advised to call us or go to UC or ER right away

## 2019-03-07 NOTE — Telephone Encounter (Signed)
Pt needs FU call [phone]. Pt says feet are swelling up still and this med must not be working. Please have [facility]'s nurse call

## 2019-03-07 NOTE — Progress Notes (Signed)
Patient ID: Patricia KinnierDiane R Fields, female   DOB: Jul 30, 1956, 63 y.o.   MRN: [id number]    Virtual Visit via video Note  This visit type was conducted due to national recommendations for restrictions regarding the COVID-19 pandemic (e.g. social distancing).  This format is felt to be most appropriate for this patient at this time.  All issues noted in this document were discussed and addressed.  No physical exam was performed (except for noted visual exam findings with Video Visits).   I connected with Donna Christeniane Smtih today at  3:40 PM EDT by a video enabled telemedicine application or telephone and verified that I am speaking with the correct person using two identifiers. Location patient: home Location provider: work or home office Persons participating in the virtual visit: patient, provider  I discussed the limitations, risks, security and privacy concerns of performing an evaluation and management service by telephone and the availability of in person appointments. I also discussed with the patient that there may be a patient responsible charge related to this service. The patient expressed understanding and agreed to proceed.   HPI:  Patient and I connected via phone to discuss foot swelling blood pressure medications.  Patient has had issues with getting her blood pressure under good control over the past couple of months.  Patient did come into office on 6/24 for blood pressure check, BP was elevated at 180/102 at that visit.  She was advised to start amlodipine at that time.  About a week later, there is a phone encounter from patient wondering what she should do regards to blood pressure medications.  She was advised to increase metoprolol to 100 mg daily and continue losartan 100 mg daily.  Patient had already stopped amlodipine at that time due to thinking it was causing leg swelling.  Now patient states she will notice swelling in feet off and on. Currently taking metoprolol 100 mg daily and  losartan 100 mg daily.  Usually the swelling in feet does improve with elevation.  States when she does notices swelling, she can push fingertips in and will leave a little indent. denies any shortness of breath or wheezing.  Denies cough.  Denies chest pain, palpitations.  Denies jaw pain, denies feeling sweaty, faint or dizzy.  Denies headache or sharp pain in head.  Denies any facial droop or speech difficulty.  Denies any one-sided extremity weakness.  States she last checked her BP at home about 2 weeks ago.  Got a reading of 204/96.  States she does not believe her blood pressure cuff is accurate and has not wanted to check her blood pressure cuff since.   ROS: See pertinent positives and negatives per HPI.  Past Medical History:  Diagnosis Date  . 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 Chronic kidney disease    renal stent  . COPD (chronic obstructive pulmonary disease) (HCC)   . History of nephrolithiasis   . HOH (hard of hearing)   . Hyperlipidemia   . Hypertension   . Interstitial cystitis   . Meniere disease   . Peripheral arterial disease [facility](HCC) Feb 2011   by renal artery duplex  . Tobacco abuse    one pack daily    Past Surgical History:  Procedure Laterality Date  . ABDOMINAL HYSTERECTOMY    . arthroscopy     left knee , remote  . BREAST EXCISIONAL BIOPSY Left 1980's   NEG  . BREAST SURGERY     left breast  cyst, benign  . CARPAL TUNNEL RELEASE     Dr. Hyacinth MeekerMiller,  right hand   . CATARACT EXTRACTION W/PHACO Right 03/05/2015   Procedure: CATARACT EXTRACTION PHACO AND INTRAOCULAR LENS PLACEMENT (IOC);  Surgeon: Lia HoppingNisha Mukherjee, MD;  Location: [facility] ORS;  Service: Ophthalmology;  Laterality: Right;  US: 00:52.3 AP%: 15.2 CDE: 7.96  . KNEE ARTHROSCOPY Left   . TOTAL ABDOMINAL HYSTERECTOMY W/ BILATERAL SALPINGOOPHORECTOMY  1992   due to uterine tumor, and endometriosis     Family History  Problem Relation Age of Onset  . Coronary artery  disease Mother   . COPD Mother   . Heart disease Mother   . Breast cancer Maternal Grandmother    Social History   Tobacco Use  . Smoking status: Current Every Day Smoker    Packs/day: 0.50    Types: Cigarettes  . Smokeless tobacco: Never Used  Substance Use Topics  . Alcohol use: No    Alcohol/week: 0.0 standard drinks    Current Outpatient Medications:  .  albuterol (PROVENTIL) (2.5 MG/3ML) 0.083% nebulizer solution, Take 3 mLs (2.5 mg total) by nebulization every 8 (eight) hours as needed for wheezing or shortness of breath., Disp: 150 mL, Rfl: 1 .  Albuterol Sulfate 108 (90 Base) MCG/ACT AEPB, Inhale 1 puff into the lungs every 8 (eight) hours as needed. For wheezing, Disp: 3 each, Rfl: 0 .  amitriptyline (ELAVIL) 25 MG tablet, Take 1 tablet (25 mg total) by mouth at bedtime., Disp: 30 tablet, Rfl: 1 .  aspirin EC 81 MG tablet, Take 1 tablet (81 mg total) by mouth daily., Disp: 90 tablet, Rfl: 2 .  atorvastatin (LIPITOR) 40 MG tablet, Take 1 tablet (40 mg total) by mouth daily., Disp: 90 tablet, Rfl: 2 .  buPROPion (WELLBUTRIN SR) 150 MG 12 hr tablet, Take 1 tablet (150 mg total) by mouth 2 (two) times daily., Disp: 180 tablet, Rfl: 1 .  Cholecalciferol (VITAMIN D3) 1000 UNITS CAPS, Take by mouth.  , Disp: , Rfl:  .  glucose blood test strip, Use once daily to check blood sugars  E11.9, Disp: 100 each, Rfl: 3 .  Lancets (FREESTYLE) lancets, Use once daily to test blood sugars  E11.9, Disp: 100 each, Rfl: 3 .  losartan (COZAAR) 100 MG tablet, Take 1 tablet (100 mg total) by mouth daily., Disp: 90 tablet, Rfl: 1 .  metoprolol succinate (TOPROL-XL) 50 MG 24 hr tablet, Take 1 tablet (50 mg total) by mouth daily. Take with or immediately following a meal., Disp: 90 tablet, Rfl: 1 .  ondansetron (ZOFRAN ODT) 4 MG disintegrating tablet, Take 1 tablet (4 mg total) by mouth every 8 (eight) hours as needed for nausea or vomiting., Disp: 60 tablet, Rfl: 2 .  PARoxetine (PAXIL) 10 MG tablet,  Take 1 tablet (10 mg total) by mouth every morning., Disp: 90 tablet, Rfl: 1 .  pentosan polysulfate (ELMIRON) 100 MG capsule, Take 1 capsule (100 mg total) by mouth 3 (three) times daily., Disp: 90 capsule, Rfl: 1 .  potassium chloride SA (K-DUR,KLOR-CON) 20 MEQ tablet, Take 1 tablet (20 mEq total) by mouth daily., Disp: 10 tablet, Rfl: 0 .  sulfamethoxazole-trimethoprim (BACTRIM DS,SEPTRA DS) 800-160 MG tablet, Take 1 tablet by mouth 2 (two) times daily., Disp: 14 tablet, Rfl: 0 .  varenicline (CHANTIX STARTING MONTH PAK) 0.5 MG X 11 & 1 MG X 42 tablet, Take one 0.5 mg tablet by mouth once daily for 3 days, then increase to one 0.5 mg tablet twice daily for  4 days, then increase to one 1 mg tablet twice daily., Disp: 53 tablet, Rfl: 0 .  vitamin E 100 UNIT capsule, Take 100 Units by mouth daily., Disp: , Rfl:   Current Facility-Administered Medications:  .  betamethasone acetate-betamethasone sodium phosphate (CELESTONE) injection 3 mg, 3 mg, Intramuscular, Once, Evans, Brent M, DPM .  ipratropium-albuterol (DUONEB) 0.5-2.5 (3) MG/3ML nebulizer solution 3 mL, 3 mL, Nebulization, Q6H, Arnett, Yvetta Coder, FNP  EXAM:  GENERAL: alert, oriented, sounds well and in no acute distress  LUNGS:  Speaking in full sentences. No signs of respiratory distress, breathing rate appears normal, no obvious gross SOB, gasping or wheezing  PSYCH/NEURO: pleasant and cooperative, no obvious depression or anxiety, speech and thought processing grossly intact  ASSESSMENT AND PLAN:  Discussed the following assessment and plan:  Hypertension, foot swelling-patient's hypertension is uncontrolled.  Advised that I want her to come into office for either an in office visit or nurse visit to check blood pressure, go over all of her medications and compare our manual blood pressure cuff to her electronic cuff -- her cuff may be faulty.  Advised that if the lower extremity swelling does respond to elevating legs, then to  elevate legs as much as possible whenever sitting to help combat swelling.  Also discussed wearing compression style hose or stockings to help reduce swelling.  Discussed avoiding excess salt in diet.  Also advised patient that if we continue to have a tougher time getting her blood pressure under control, another option in our care plan could be to refer to cardiology for assistance in medications for blood pressure control.     I discussed the assessment and treatment plan with the patient. The patient was provided an opportunity to ask questions and all were answered. The patient agreed with the plan and demonstrated an understanding of the instructions.   The patient was advised to call back or seek an in-person evaluation if the symptoms worsen or if the condition fails to improve as anticipated.  I provided 15 minutes of non-face-to-face time over phone during this encounter.   Jodelle Green, FNP

## 2019-03-07 NOTE — Telephone Encounter (Signed)
Called and spoke to patient.  Pt c/o of feet swelling.  Pt said that her meds were changed several weeks ago by Dr. Caryl Bis.  Pt said that she was told to take 5 mg of amlodopine and 2 tablets of metoprolol.  Pt said that soon after her feet started to swell but then stopped.  Pt said that feet begin to swell again a couple of days ago.  Pt said that her ankles are also a little swollen. Pt denied having any other symptoms.  No SOB, no chest pain, no chest tightness.    Pt informed that her PCP is out of the office this afternoon.  Pt scheduled an appt today with Philis Nettle, FNP at 3:40 pm.  Pt advised to go to UC if symptoms worsen.

## 2019-03-07 NOTE — Telephone Encounter (Signed)
Can we get her scheduled for a nurse visit ASAP?  Currently on losartan 100 mg daily & metoprolol 100 mg daily  She stopped the amlodipine due to thinking it making swelling worse in feet.   She last checked her BP about 2 weeks ago and it was 204/96 -- She does not think her BP cuff is right.   No CP, SOB, Palpitations. LE swelling improves with leg elevation  Advised to bring her cuff and medications into office so we can get everything sorted out.

## 2019-03-07 NOTE — Telephone Encounter (Signed)
patinet needs to be added on as a telephone visit tomorrow with me in the morning at 11:30 and she needs to add back 5 mg amlodipine ASAP

## 2019-03-08 ENCOUNTER — Ambulatory Visit (INDEPENDENT_AMBULATORY_CARE_PROVIDER_SITE_OTHER): Payer: Medicare Other | Admitting: Internal Medicine

## 2019-03-08 ENCOUNTER — Ambulatory Visit: Payer: Medicare Other | Admitting: Internal Medicine

## 2019-03-08 ENCOUNTER — Encounter: Payer: Self-pay | Admitting: Internal Medicine

## 2019-03-08 DIAGNOSIS — I150 Renovascular hypertension: Secondary | ICD-10-CM

## 2019-03-08 DIAGNOSIS — F411 Generalized anxiety disorder: Secondary | ICD-10-CM | POA: Diagnosis not present

## 2019-03-08 DIAGNOSIS — H8120 Vestibular neuronitis, unspecified ear: Secondary | ICD-10-CM | POA: Diagnosis not present

## 2019-03-08 DIAGNOSIS — E1151 Type 2 diabetes mellitus with diabetic peripheral angiopathy without gangrene: Secondary | ICD-10-CM

## 2019-03-08 MED ORDER — HYDROCHLOROTHIAZIDE 25 MG PO TABS: 25.0000 mg | ORAL_TABLET | Freq: Every day | ORAL | 1 refills | Status: DC

## 2019-03-08 MED ORDER — ONDANSETRON 4 MG PO TBDP: 4.0000 mg | ORAL_TABLET | Freq: Three times a day (TID) | ORAL | 2 refills | Status: DC | PRN

## 2019-03-08 MED ORDER — PAROXETINE HCL 10 MG PO TABS: 10.0000 mg | ORAL_TABLET | Freq: Every morning | ORAL | 0 refills | Status: DC

## 2019-03-08 MED ORDER — BLOOD GLUCOSE METER KIT: PACK | 0 refills | Status: DC

## 2019-03-08 NOTE — Telephone Encounter (Signed)
Spoke with pt and to schedule her for a telephone visit with Dr. Derrel Nip and told the pt that she needs to start the Amlodipine 5mg  asap. Pt stated that as soon as her granddaughter in law wakes up she will have her take her to get her medication from her house.

## 2019-03-08 NOTE — Progress Notes (Signed)
Refills:   Paxil

## 2019-03-08 NOTE — Assessment & Plan Note (Signed)
Adding amlodipine 5 mg daily at night and metoprolol at night.  adding hctz 25 mg dailyin the am   And continue losartan.  RN viisit  on Monday to reassess control

## 2019-03-08 NOTE — Assessment & Plan Note (Signed)
Advised to resume paxil ,  Refill [facility] days,  Needs 90 day sent to mail order

## 2019-03-08 NOTE — Telephone Encounter (Signed)
Pt has telephone visit with Dr Derrel Nip on 03/08/19 @ 3:30pm

## 2019-03-08 NOTE — Assessment & Plan Note (Signed)
Chronic and episodic brought on by weather/barometric changes

## 2019-03-08 NOTE — Progress Notes (Signed)
Telephone  Note  This visit type was conducted due to national recommendations for restrictions regarding the COVID-19 pandemic (e.g. social distancing).  This format is felt to be most appropriate for this patient at this time.  All issues noted in this document were discussed and addressed.  No physical exam was performed (except for noted visual exam findings with Video Visits).   I connected with@ on 03/08/19 at  3:30 PM EDT by telephone and verified that I am speaking with the correct person using two identifiers. Location patient: home Location provider: work or home office Persons participating in the virtual visit: patient, provider  I discussed the limitations, risks, security and privacy concerns of performing an evaluation and management service by telephone and the availability of in person appointments. I also discussed with the patient that there may be a patient responsible charge related to this service. The patient expressed understanding and agreed to proceed.   Reason for visit: elevated blood pressure  HPI:    63 yr old female with type 2 DM, smoker,  And uncontrolled hypertension due to medication  Noncompliance . for follow up on elevated readings and medication  changes.  Seen in May,  Amlodipine started and added to losartan and metoprolol)  .  Seen on June 24 and amlodpine increased to 5 mg .  Patient stopped medication due to swelling of legs  And called office with systolic pressure of 741.  Denies chest pain , dyspnea . Had a Telephone visit with LG on July 16  , no med changes,   I advised her to resume amlodipine at 5 mg   Patient does not check blood sugars because her glucometer malfunctioning  Last one was 135 a month ago in a fasting state.  Dos not recall any above 200 lr less than 80.  No complaints today.  Taking his medications as directed,  Not exercising on a regular basis or trying to lose weight.  Patient voices awareness  of the foods he/she needs to  avoid,  And follows a low GI diet about 50% of the time.  Has not had an annual diabetic eye exam.  Denies numbness and tingling in lower extremities.  Denies hypoglycemic symptoms.  GAD; ; she was feeling better on paxil and did not realize that she had run out    ROS: See pertinent positives and negatives per HPI.  Past Medical History:  Diagnosis Date  . 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 Chronic kidney disease    renal stent  . COPD (chronic obstructive pulmonary disease) ([place])   . History of nephrolithiasis   . HOH (hard of hearing)   . Hyperlipidemia   . Hypertension   . Interstitial cystitis   . Meniere disease   . Peripheral arterial disease [facility]) Feb 2011   by renal artery duplex  . Tobacco abuse    one pack daily    Past Surgical History:  Procedure Laterality Date  . ABDOMINAL HYSTERECTOMY    . arthroscopy     left knee , remote  . BREAST EXCISIONAL BIOPSY Left 1980's   NEG  . BREAST SURGERY     left breast cyst, benign  . CARPAL TUNNEL RELEASE     Dr. Sabra Heck,  right hand   . CATARACT EXTRACTION W/PHACO Right 03/05/2015   Procedure: CATARACT EXTRACTION PHACO AND INTRAOCULAR LENS PLACEMENT (IOC);  Surgeon: Lyla Glassing, MD;  Location: [facility] ORS;  Service: Ophthalmology;  Laterality: Right;  [place]: 00:52.3 AP%: 15.2 CDE: 7.96  . KNEE ARTHROSCOPY Left   . TOTAL ABDOMINAL HYSTERECTOMY W/ BILATERAL SALPINGOOPHORECTOMY  1992   due to uterine tumor, and endometriosis     Family History  Problem Relation Age of Onset  . Coronary artery disease Mother   . COPD Mother   . Heart disease Mother   . Breast cancer Maternal Grandmother     SOCIAL HX:  reports that she has been smoking cigarettes. She has been smoking about 0.50 packs per day. She has never used smokeless tobacco. She reports that she does not drink alcohol or use drugs.   Current Outpatient Medications:  .  albuterol (PROVENTIL) (2.5 MG/3ML) 0.083% nebulizer solution,  Take 3 mLs (2.5 mg total) by nebulization every 8 (eight) hours as needed for wheezing or shortness of breath., Disp: 150 mL, Rfl: 1 .  Albuterol Sulfate 108 (90 Base) MCG/ACT AEPB, Inhale 1 puff into the lungs every 8 (eight) hours as needed. For wheezing, Disp: 3 each, Rfl: 0 .  amLODipine (NORVASC) 5 MG tablet, Take 5 mg by mouth daily., Disp: , Rfl:  .  aspirin EC 81 MG tablet, Take 1 tablet (81 mg total) by mouth daily., Disp: 90 tablet, Rfl: 2 .  atorvastatin (LIPITOR) 40 MG tablet, Take 1 tablet (40 mg total) by mouth daily., Disp: 90 tablet, Rfl: 2 .  buPROPion (WELLBUTRIN SR) 150 MG 12 hr tablet, Take 1 tablet (150 mg total) by mouth 2 (two) times daily., Disp: 180 tablet, Rfl: 1 .  Cholecalciferol (VITAMIN D3) 1000 UNITS CAPS, Take by mouth.  , Disp: , Rfl:  .  losartan (COZAAR) 100 MG tablet, Take 1 tablet (100 mg total) by mouth daily., Disp: 90 tablet, Rfl: 1 .  metoprolol succinate (TOPROL-XL) 50 MG 24 hr tablet, Take 1 tablet (50 mg total) by mouth daily. Take with or immediately following a meal., Disp: 90 tablet, Rfl: 1 .  ondansetron (ZOFRAN ODT) 4 MG disintegrating tablet, Take 1 tablet (4 mg total) by mouth every 8 (eight) hours as needed for nausea or vomiting., Disp: 60 tablet, Rfl: 2 .  vitamin E 100 UNIT capsule, Take 100 Units by mouth daily., Disp: , Rfl:  .  amitriptyline (ELAVIL) 25 MG tablet, Take 1 tablet (25 mg total) by mouth at bedtime. (Patient not taking: Reported on 03/08/2019), Disp: 30 tablet, Rfl: 1 .  blood glucose meter kit and supplies, Use to check blood sugars once daily.  (FOR ICD-10 E10.9, E11.9)., Disp: 1 each, Rfl: 0 .  hydrochlorothiazide (HYDRODIURIL) 25 MG tablet, Take 1 tablet (25 mg total) by mouth daily., Disp: 30 tablet, Rfl: 1 .  PARoxetine (PAXIL) 10 MG tablet, Take 1 tablet (10 mg total) by mouth every morning., Disp: 30 tablet, Rfl: 0 .  potassium chloride SA (K-DUR,KLOR-CON) 20 MEQ tablet, Take 1 tablet (20 mEq total) by mouth daily. (Patient  not taking: Reported on 03/08/2019), Disp: 10 tablet, Rfl: 0  Current Facility-Administered Medications:  .  betamethasone acetate-betamethasone sodium phosphate (CELESTONE) injection 3 mg, 3 mg, Intramuscular, Once, Evans, Brent M, DPM .  ipratropium-albuterol (DUONEB) 0.5-2.5 (3) MG/3ML nebulizer solution 3 mL, 3 mL, Nebulization, Q6H, Arnett, Yvetta Coder, FNP  EXAM:   General impression: alert, cooperative and articulate.  No signs of being in distress  Lungs: speech is fluent sentence length suggests that patient is not short of breath and not punctuated by cough, sneezing or sniffing. [name]   Psych: affect normal.  speech is articulate and non pressured .  Denies suicidal thoughts   ASSESSMENT AND PLAN:  Renovascular hypertension Adding amlodipine 5 mg daily at night and metoprolol at night.  adding hctz 25 mg dailyin the am   And continue losartan.  RN viisit  on Monday to reassess control   Subacute vestibular neuronitis Chronic and episodic brought on by weather/barometric changes   Generalized anxiety disorder Advised to resume paxil ,  [facility],  Needs 90 day sent to mail order   DM (diabetes mellitus), type 2 with peripheral vascular complications ([place]) Diagnosed in April 2018.  Thus far well managed with  diet .  Smoking cessation strongly  Advised and in progress.  Continue  baby aspirin.  Continue lipitor, losartan.  Repeat labs reviewed     Lab Results  Component Value Date   HGBA1C 6.2 01/24/2019   Lab Results  Component Value Date   ALT 13 01/24/2019   AST 12 01/24/2019   ALKPHOS 130 (H) 01/24/2019   ALKPHOS 117 01/24/2019   BILITOT 0.5 01/24/2019   Lab Results  Component Value Date   CREATININE 0.87 01/24/2019   Lab Results  Component Value Date   MICROALBUR 28.0 (H) 07/12/2018       I discussed the assessment and treatment plan with the patient. The patient was provided an opportunity to ask questions and all were answered. The  patient agreed with the plan and demonstrated an understanding of the instructions.   The patient was advised to call back or seek an in-person evaluation if the symptoms worsen or if the condition fails to improve as anticipated.  I provided 25 minutes of non-face-to-face time during this encounter.   Crecencio Mc, MD

## 2019-03-10 NOTE — Assessment & Plan Note (Signed)
Diagnosed in April 2018.  Thus far well managed with  diet .  Smoking cessation strongly  Advised and in progress.  Continue  baby aspirin.  Continue lipitor, losartan.  Repeat labs reviewed     Lab Results  Component Value Date   HGBA1C 6.2 01/24/2019   Lab Results  Component Value Date   ALT 13 01/24/2019   AST 12 01/24/2019   ALKPHOS 130 (H) 01/24/2019   ALKPHOS 117 01/24/2019   BILITOT 0.5 01/24/2019   Lab Results  Component Value Date   CREATININE 0.87 01/24/2019   Lab Results  Component Value Date   MICROALBUR 28.0 (H) 07/12/2018

## 2019-03-11 ENCOUNTER — Other Ambulatory Visit: Payer: Self-pay

## 2019-03-11 ENCOUNTER — Ambulatory Visit (INDEPENDENT_AMBULATORY_CARE_PROVIDER_SITE_OTHER): Payer: Medicare Other

## 2019-03-11 VITALS — BP 162/90 | HR 66

## 2019-03-11 DIAGNOSIS — I10 Essential (primary) hypertension: Secondary | ICD-10-CM

## 2019-03-11 DIAGNOSIS — I150 Renovascular hypertension: Secondary | ICD-10-CM

## 2019-03-11 NOTE — Progress Notes (Addendum)
Patient here for nurse visit BP check per MD order from [place] 03/08/19.  Patient reports compliance with prescribed BP medications: YES   Last dose of BP medication: between 10 am and 11 am this morning  Patient brought in her automatic bp wrist cuff from home.   BP check on home cuff:  Left arm- 161/90  Patient denied having any symptoms today.   BP Readings from Last 3 Encounters:  03/11/19 (!) 162/90  02/28/19 140/90  02/13/19 (!) 186/102   Pulse Readings from Last 3 Encounters:  03/11/19 66  02/28/19 73  02/13/19 90      I have reviewed the above information and agree with above.  patient is not at goal yet and if home readings are still > 130/80,  She should increase the amlodipine dose to 10 mg daily in the evening,  Continue metopolol,  hctz and losartan at current doses.  Home machine appears to be accurate  So patient should send readings after one week of new regimen.    Deborra Medina, MD

## 2019-03-13 ENCOUNTER — Encounter: Payer: Self-pay | Admitting: Gastroenterology

## 2019-03-20 ENCOUNTER — Telehealth: Payer: Self-pay | Admitting: Internal Medicine

## 2019-03-20 NOTE — Telephone Encounter (Signed)
BP is Not at goal per July 20 RN visit.  patient should increase the amlodipine dose to 10 mg daily in the evening,  Continue metopolol,  hctz and losartan at current doses.  Home machine appears to be accurate  So patient should send readings after one week of new regimen.

## 2019-03-20 NOTE — Telephone Encounter (Signed)
Called pt at both home and cell phone numbers.  Unable to leave voice message since mailbox was full.  Sent pt a MyChart message with medication change instructions from Dr. Derrel Nip.

## 2019-03-20 NOTE — Assessment & Plan Note (Signed)
Not at goal per July 20 RN visit.  patient should increase the amlodipine dose to 10 mg daily in the evening,  Continue metopolol,  hctz and losartan at current doses.  Home machine appears to be accurate  So patient should send readings after one week of new regimen.

## 2019-03-20 NOTE — Telephone Encounter (Signed)
Patient returned call from the office.  Gave pt medication change instructions per PCP's notes.  Pt wrote down instructions. Pt voiced understanding and had no concerns or questions.   Pt will send readings through MyChart in a week.

## 2019-04-04 ENCOUNTER — Other Ambulatory Visit: Payer: Self-pay

## 2019-04-04 DIAGNOSIS — H2512 Age-related nuclear cataract, left eye: Secondary | ICD-10-CM | POA: Diagnosis not present

## 2019-04-04 DIAGNOSIS — H25812 Combined forms of age-related cataract, left eye: Secondary | ICD-10-CM | POA: Diagnosis not present

## 2019-04-04 NOTE — Patient Outreach (Signed)
[facility]) Care Management  04/04/2019  Patricia Fields 06/30/1956 [id number]   Medication Adherence call to Patricia Fields patient did not answer patient is past due on Losartan 100 mg under [place].   [place] Management Direct Dial [phone]  Fax [phone] [email or web address]

## 2019-04-10 ENCOUNTER — Other Ambulatory Visit: Payer: Self-pay | Admitting: Internal Medicine

## 2019-04-30 ENCOUNTER — Other Ambulatory Visit: Payer: Self-pay | Admitting: Internal Medicine

## 2019-05-02 DIAGNOSIS — Z961 Presence of intraocular lens: Secondary | ICD-10-CM | POA: Diagnosis not present

## 2019-05-26 ENCOUNTER — Other Ambulatory Visit: Payer: Self-pay | Admitting: Internal Medicine

## 2019-06-10 ENCOUNTER — Telehealth: Payer: Self-pay | Admitting: Internal Medicine

## 2019-06-10 NOTE — Telephone Encounter (Signed)
Attempted to call pt. Mail box is full. Need to further triage pt.

## 2019-06-10 NOTE — Telephone Encounter (Signed)
No. .  She needs to get [place] . AND SET UP WITH A VIRTUAL OR TELEPHONE VISIT

## 2019-06-10 NOTE — Telephone Encounter (Signed)
Attempted to call pt. Mail box is full.  

## 2019-06-10 NOTE — Telephone Encounter (Signed)
Pt called to see if some Tessalon pearls can be called in and/or an anti biotic for her bronchitis / please advise

## 2019-06-10 NOTE — Telephone Encounter (Signed)
Spoke with pt and she stated that she has a productive cough x2-3 days. Pt stated that she gets bronchitis frequently and she feels like that is what she has now. Pt is wanting to know if you can call in tessalon pearls and an antibiotic to help her get rid of the cough.

## 2019-06-11 NOTE — Telephone Encounter (Signed)
Patient called in and returned call from Patricia Fields. Advised patient of advise from PCP to go get tested for COVID-19. Patient stated she does not want to go get tested and she stated, "it is a waste of time and knows it is bronchitis." Patient is wanting a call back from [place] to further discuss. Please advise.

## 2019-06-11 NOTE — Telephone Encounter (Signed)
Spoke with pt and she stated that she does not want to get Covid tested. She stated that she has been sick like this so many times that she knows what it is. I have at least scheduled pt for a telephone visit tomorrow at 11:30am.   Pt is needing to get her SS card and the only way to do that is with an ID. The pt stated that she does not have an ID but was told that if she had a copy for her last doctor's office note and the doctor signature on each page with our office stamp she could use that as her form of ID.

## 2019-06-12 ENCOUNTER — Other Ambulatory Visit: Payer: Self-pay

## 2019-06-12 ENCOUNTER — Ambulatory Visit (INDEPENDENT_AMBULATORY_CARE_PROVIDER_SITE_OTHER): Payer: Medicare Other | Admitting: Internal Medicine

## 2019-06-12 ENCOUNTER — Encounter: Payer: Self-pay | Admitting: Internal Medicine

## 2019-06-12 DIAGNOSIS — J209 Acute bronchitis, unspecified: Secondary | ICD-10-CM | POA: Diagnosis not present

## 2019-06-12 DIAGNOSIS — R05 Cough: Secondary | ICD-10-CM

## 2019-06-12 DIAGNOSIS — J440 Chronic obstructive pulmonary disease with acute lower respiratory infection: Secondary | ICD-10-CM | POA: Diagnosis not present

## 2019-06-12 DIAGNOSIS — R059 Cough, unspecified: Secondary | ICD-10-CM

## 2019-06-12 MED ORDER — PREDNISONE 10 MG PO TABS: ORAL_TABLET | ORAL | 0 refills | Status: DC

## 2019-06-12 MED ORDER — BENZONATATE 100 MG PO CAPS: 100.0000 mg | ORAL_CAPSULE | Freq: Three times a day (TID) | ORAL | 1 refills | Status: DC | PRN

## 2019-06-12 MED ORDER — AZITHROMYCIN 500 MG PO TABS: 500.0000 mg | ORAL_TABLET | Freq: Every day | ORAL | 0 refills | Status: DC

## 2019-06-12 NOTE — Assessment & Plan Note (Signed)
In the absence of other associated symptoms ,  She is reluctant to get COVID TESTING.   wil treat with prednisone,  Azithromycin, and tessalon perles. Advised to get COVID TESTING on Monday if not significantly improved.

## 2019-06-12 NOTE — Progress Notes (Signed)
Telephone Note  This visit type was conducted due to national recommendations for restrictions regarding the COVID-19 pandemic (e.g. social distancing).  This format is felt to be most appropriate for this patient at this time.  All issues noted in this document were discussed and addressed.  No physical exam was performed (except for noted visual exam findings with Video Visits).   I connected with@ on 06/12/19 at 11:30 AM EDT by telephone and verified that I am speaking with the correct person using two identifiers. Location patient: home Location provider: work or home office Persons participating in the virtual visit: patient, provider  I discussed the limitations, risks, security and privacy concerns of performing an evaluation and management service by telephone and the availability of in person appointments. I also discussed with the patient that there may be a patient responsible charge related to this service. The patient expressed understanding and agreed to proceed.  Reason for visit: respiratory symptoms   HPI:  63 yr old female with history of hypertension Type 2 DM, and COPD with ongoing  tobacco abuse presents with a cough for the last 3 days.  Started after visiting her granddaughter who was sick as well.  Denies fevers,  Body aches,  Shortness of breath,  And loss of taste/smell.  Using nebulized albuterol .  Still smoking 1/2 pack daily.  Masking when out of the home (excpet when visited granddaughter)     ROS: See pertinent positives and negatives per HPI.  Past Medical History:  Diagnosis Date  . 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 Chronic kidney disease    renal stent  . COPD (chronic obstructive pulmonary disease) ([place])   . History of nephrolithiasis   . HOH (hard of hearing)   . Hyperlipidemia   . Hypertension   . Interstitial cystitis   . Meniere disease   . Peripheral arterial disease [facility]) Feb 2011   by renal artery duplex  .  Tobacco abuse    one pack daily    Past Surgical History:  Procedure Laterality Date  . ABDOMINAL HYSTERECTOMY    . arthroscopy     left knee , remote  . BREAST EXCISIONAL BIOPSY Left 1980's   NEG  . BREAST SURGERY     left breast cyst, benign  . CARPAL TUNNEL RELEASE     Dr. Sabra Heck,  right hand   . CATARACT EXTRACTION W/PHACO Right 03/05/2015   Procedure: CATARACT EXTRACTION PHACO AND INTRAOCULAR LENS PLACEMENT (IOC);  Surgeon: Lyla Glassing, MD;  Location: [facility] ORS;  Service: Ophthalmology;  Laterality: Right;  [place]: 00:52.3 AP%: 15.2 CDE: 7.96  . KNEE ARTHROSCOPY Left   . TOTAL ABDOMINAL HYSTERECTOMY W/ BILATERAL SALPINGOOPHORECTOMY  1992   due to uterine tumor, and endometriosis     Family History  Problem Relation Age of Onset  . Coronary artery disease Mother   . COPD Mother   . Heart disease Mother   . Breast cancer Maternal Grandmother     SOCIAL HX:  reports that she has been smoking cigarettes. She has been smoking about 0.50 packs per day. She has never used smokeless tobacco. She reports that she does not drink alcohol or use drugs.   Current Outpatient Medications:  .  albuterol (PROVENTIL) (2.5 MG/3ML) 0.083% nebulizer solution, Take 3 mLs (2.5 mg total) by nebulization every 8 (eight) hours as needed for wheezing or shortness of breath., Disp: 150 mL, Rfl: 1 .  Albuterol Sulfate 108 (90 Base) MCG/ACT AEPB,  Inhale 1 puff into the lungs every 8 (eight) hours as needed. For wheezing, Disp: 3 each, Rfl: 0 .  amitriptyline (ELAVIL) 25 MG tablet, Take 1 tablet (25 mg total) by mouth at bedtime., Disp: 30 tablet, Rfl: 1 .  amLODipine (NORVASC) 5 MG tablet, Take 5 mg by mouth daily., Disp: , Rfl:  .  aspirin EC 81 MG tablet, Take 1 tablet (81 mg total) by mouth daily., Disp: 90 tablet, Rfl: 2 .  atorvastatin (LIPITOR) 40 MG tablet, Take 1 tablet by mouth once daily, Disp: 90 tablet, Rfl: 3 .  blood glucose meter kit and supplies, Use to check blood sugars once daily.   (FOR ICD-10 E10.9, E11.9)., Disp: 1 each, Rfl: 0 .  buPROPion (WELLBUTRIN SR) 150 MG 12 hr tablet, Take 1 tablet (150 mg total) by mouth 2 (two) times daily., Disp: 180 tablet, Rfl: 1 .  Cholecalciferol (VITAMIN D3) 1000 UNITS CAPS, Take by mouth.  , Disp: , Rfl:  .  hydrochlorothiazide (HYDRODIURIL) 25 MG tablet, Take 1 tablet (25 mg total) by mouth daily., Disp: 30 tablet, Rfl: 1 .  Lancets (ONETOUCH DELICA PLUS [id number]) MISC, USE WITH DEVICE TO TEST  ONCE DAILY, Disp: 100 each, Rfl: 3 .  losartan (COZAAR) 100 MG tablet, Take 1 tablet by mouth once daily, Disp: 90 tablet, Rfl: 1 .  metoprolol succinate (TOPROL-XL) 50 MG 24 hr tablet, Take 1 tablet (50 mg total) by mouth daily. Take with or immediately following a meal., Disp: 90 tablet, Rfl: 1 .  ondansetron (ZOFRAN ODT) 4 MG disintegrating tablet, Take 1 tablet (4 mg total) by mouth every 8 (eight) hours as needed for nausea or vomiting., Disp: 60 tablet, Rfl: 2 .  ONETOUCH ULTRA test strip, USE WITH METER TO CHECK  BLOOD SUGAR ONCE DAILY., Disp: 100 strip, Rfl: 3 .  PARoxetine (PAXIL) 10 MG tablet, TAKE 1 TABLET BY MOUTH ONCE DAILY IN THE MORNING, Disp: 90 tablet, Rfl: 1 .  potassium chloride SA (K-DUR,KLOR-CON) 20 MEQ tablet, Take 1 tablet (20 mEq total) by mouth daily., Disp: 10 tablet, Rfl: 0 .  vitamin E 100 UNIT capsule, Take 100 Units by mouth daily., Disp: , Rfl:  .  azithromycin (ZITHROMAX) 500 MG tablet, Take 1 tablet (500 mg total) by mouth daily., Disp: 7 tablet, Rfl: 0 .  benzonatate (TESSALON PERLES) 100 MG capsule, Take 1 capsule (100 mg total) by mouth 3 (three) times daily as needed for cough., Disp: 30 capsule, Rfl: 1 .  predniSONE (DELTASONE) 10 MG tablet, 6 tablets daily for 3 days ,then reduce by 1 tablet daily until gone, Disp: 33 tablet, Rfl: 0  Current Facility-Administered Medications:  .  betamethasone acetate-betamethasone sodium phosphate (CELESTONE) injection 3 mg, 3 mg, Intramuscular, Once, Evans, Brent M, DPM .   ipratropium-albuterol (DUONEB) 0.5-2.5 (3) MG/3ML nebulizer solution 3 mL, 3 mL, Nebulization, Q6H, Arnett, Yvetta Coder, FNP  EXAM:   General impression: alert, cooperative and articulate.  No signs of being in distress  Lungs: speech is fluent sentence length suggests that patient is not short of breath and not punctuated by cough, sneezing or sniffing. [name]   Psych: affect normal.  speech is articulate and non pressured .  Denies suicidal thoughts   ASSESSMENT AND PLAN:  Discussed the following assessment and plan:  Cough  Acute bronchitis with COPD ([place]) - Plan: benzonatate (TESSALON PERLES) 100 MG capsule  Cough In the absence of other associated symptoms ,  She is reluctant to get COVID TESTING.  wil treat with prednisone,  Azithromycin, and tessalon perles. Advised to get COVID TESTING on Monday if not significantly improved.     I discussed the assessment and treatment plan with the patient. The patient was provided an opportunity to ask questions and all were answered. The patient agreed with the plan and demonstrated an understanding of the instructions.   The patient was advised to call back or seek an in-person evaluation if the symptoms worsen or if the condition fails to improve as anticipated.   I provided  22 minutes of non-face-to-face time during this encounter reviewing patient's current problems,  Providing counseling on the above mentioned problems , and coordination  of care . Crecencio Mc, MD

## 2019-07-01 ENCOUNTER — Telehealth: Payer: Self-pay

## 2019-07-01 NOTE — Telephone Encounter (Signed)
Copied from [place] [id number]. Topic: General - Inquiry >> Jul 01, 2019  2:16 PM Alease Frame wrote: Reason for CRM: patient would like a call back from [place] . Please advise

## 2019-07-08 ENCOUNTER — Other Ambulatory Visit: Payer: Self-pay

## 2019-07-08 MED ORDER — LOSARTAN POTASSIUM 100 MG PO TABS: 100.0000 mg | ORAL_TABLET | Freq: Every day | ORAL | 0 refills | Status: DC

## 2019-08-26 ENCOUNTER — Other Ambulatory Visit: Payer: Self-pay | Admitting: Family Medicine

## 2019-08-26 ENCOUNTER — Other Ambulatory Visit: Payer: Self-pay | Admitting: Internal Medicine

## 2019-09-15 DIAGNOSIS — W19XXXA Unspecified fall, initial encounter: Secondary | ICD-10-CM | POA: Diagnosis not present

## 2019-09-15 DIAGNOSIS — I10 Essential (primary) hypertension: Secondary | ICD-10-CM | POA: Diagnosis not present

## 2019-09-15 DIAGNOSIS — R519 Headache, unspecified: Secondary | ICD-10-CM | POA: Diagnosis not present

## 2019-09-15 DIAGNOSIS — S0990XA Unspecified injury of head, initial encounter: Secondary | ICD-10-CM | POA: Diagnosis not present

## 2019-09-15 DIAGNOSIS — S0083XA Contusion of other part of head, initial encounter: Secondary | ICD-10-CM | POA: Diagnosis not present

## 2019-09-15 DIAGNOSIS — Z8249 Family history of ischemic heart disease and other diseases of the circulatory system: Secondary | ICD-10-CM | POA: Diagnosis not present

## 2019-09-15 DIAGNOSIS — Z7982 Long term (current) use of aspirin: Secondary | ICD-10-CM | POA: Diagnosis not present

## 2019-09-15 DIAGNOSIS — Z79899 Other long term (current) drug therapy: Secondary | ICD-10-CM | POA: Diagnosis not present

## 2019-09-15 DIAGNOSIS — I2510 Atherosclerotic heart disease of native coronary artery without angina pectoris: Secondary | ICD-10-CM | POA: Diagnosis not present

## 2019-09-17 DIAGNOSIS — I771 Stricture of artery: Secondary | ICD-10-CM | POA: Diagnosis not present

## 2019-09-17 DIAGNOSIS — Z79899 Other long term (current) drug therapy: Secondary | ICD-10-CM | POA: Diagnosis not present

## 2019-09-17 DIAGNOSIS — Z7982 Long term (current) use of aspirin: Secondary | ICD-10-CM | POA: Diagnosis not present

## 2019-09-17 DIAGNOSIS — E785 Hyperlipidemia, unspecified: Secondary | ICD-10-CM | POA: Diagnosis not present

## 2019-09-17 DIAGNOSIS — Z95828 Presence of other vascular implants and grafts: Secondary | ICD-10-CM | POA: Diagnosis not present

## 2019-09-17 DIAGNOSIS — I701 Atherosclerosis of renal artery: Secondary | ICD-10-CM | POA: Diagnosis not present

## 2019-09-17 DIAGNOSIS — E119 Type 2 diabetes mellitus without complications: Secondary | ICD-10-CM | POA: Diagnosis not present

## 2019-09-17 DIAGNOSIS — H8109 Meniere's disease, unspecified ear: Secondary | ICD-10-CM | POA: Diagnosis not present

## 2019-09-17 DIAGNOSIS — I10 Essential (primary) hypertension: Secondary | ICD-10-CM | POA: Diagnosis not present

## 2019-09-27 ENCOUNTER — Telehealth: Payer: Self-pay | Admitting: Internal Medicine

## 2019-09-27 ENCOUNTER — Other Ambulatory Visit: Payer: Self-pay | Admitting: Internal Medicine

## 2019-09-27 DIAGNOSIS — R921 Mammographic calcification found on diagnostic imaging of breast: Secondary | ICD-10-CM

## 2019-09-27 NOTE — Telephone Encounter (Signed)
Since pt is having breast pain does she need to be seen in the office before ordering a mammogram?

## 2019-09-27 NOTE — Telephone Encounter (Signed)
Mailbox is full.

## 2019-09-27 NOTE — Telephone Encounter (Signed)
Pt needs a order for a mammogram and needs a phone call back about ongoing brest pain

## 2019-09-27 NOTE — Telephone Encounter (Signed)
No  She doesn't.  It looks like the diagnostic mammogram has been ordered.  Thanks to whoever did it

## 2019-10-02 NOTE — Telephone Encounter (Signed)
Spoke with pt to let her know that the orders have been placed for her to have a diagnostic mammogram. Pt stated that she would give [place] a call the schedule.   The pt also asked that we print a copy of her last office note and have Dr. Darrick Huntsman sign each page. She stated that she needs this so she can get her social security card. Office note has been printed and placed in quick sign folder for signature and then will be mailed as requested by pt.

## 2019-10-07 ENCOUNTER — Other Ambulatory Visit: Payer: Self-pay | Admitting: Internal Medicine

## 2019-10-07 ENCOUNTER — Ambulatory Visit
Admission: RE | Admit: 2019-10-07 | Discharge: 2019-10-07 | Disposition: A | Discharge status: Home / Self Care | Payer: Medicare Other | Source: Ambulatory Visit | Attending: Internal Medicine | Admitting: Internal Medicine

## 2019-10-07 DIAGNOSIS — R921 Mammographic calcification found on diagnostic imaging of breast: Secondary | ICD-10-CM

## 2019-10-07 DIAGNOSIS — R928 Other abnormal and inconclusive findings on diagnostic imaging of breast: Secondary | ICD-10-CM

## 2019-10-14 ENCOUNTER — Telehealth: Payer: Self-pay | Admitting: Internal Medicine

## 2019-10-14 NOTE — Telephone Encounter (Signed)
Pt states that Dr Darrick Huntsman told her to call back today if she had not heard from [facility] to schedule a biopsy. Please call back to advise.

## 2019-10-14 NOTE — Telephone Encounter (Signed)
Can you check on this?

## 2019-10-16 NOTE — Telephone Encounter (Signed)
Appt has been scheduled.

## 2019-10-16 NOTE — Telephone Encounter (Signed)
Good morning!  Patricia Fields from [facility] will call pt to schedule.

## 2019-10-23 ENCOUNTER — Ambulatory Visit
Admission: RE | Admit: 2019-10-23 | Discharge: 2019-10-23 | Disposition: A | Discharge status: Home / Self Care | Payer: Medicare Other | Source: Ambulatory Visit | Attending: Internal Medicine | Admitting: Internal Medicine

## 2019-10-23 DIAGNOSIS — R928 Other abnormal and inconclusive findings on diagnostic imaging of breast: Secondary | ICD-10-CM

## 2019-10-23 DIAGNOSIS — R921 Mammographic calcification found on diagnostic imaging of breast: Secondary | ICD-10-CM | POA: Diagnosis not present

## 2019-10-23 DIAGNOSIS — N6081 Other benign mammary dysplasias of right breast: Secondary | ICD-10-CM | POA: Diagnosis not present

## 2019-10-23 HISTORY — PX: BREAST BIOPSY: SHX20

## 2019-10-24 LAB — SURGICAL PATHOLOGY

## 2019-10-24 NOTE — Progress Notes (Signed)
  I tried to call you but the voice mail did not identify you so I did not leave a message.  Your breast biopsy was NOT CANCER!  [facility]!   Regards,   Duncan Dull, MD

## 2019-10-25 ENCOUNTER — Telehealth: Payer: Self-pay | Admitting: Internal Medicine

## 2019-10-25 NOTE — Telephone Encounter (Signed)
Pt called back and I let her know she needs to call [place] to schedule yearly appt. She said she already got her results from Dr. Darrick Huntsman.

## 2019-10-25 NOTE — Telephone Encounter (Signed)
Linda with radiology at [facility] called and stated that she could not get in touch with pt to give benign results and to schedule for yearly. I called pt's son and he will get mother to call back. Just FYI

## 2019-10-25 NOTE — Telephone Encounter (Signed)
FYI

## 2019-10-28 ENCOUNTER — Other Ambulatory Visit: Payer: Self-pay | Admitting: Family Medicine

## 2019-10-28 ENCOUNTER — Other Ambulatory Visit: Payer: Self-pay | Admitting: Internal Medicine

## 2019-10-30 ENCOUNTER — Telehealth: Payer: Self-pay | Admitting: Internal Medicine

## 2019-10-30 MED ORDER — AMLODIPINE BESYLATE 5 MG PO TABS: 5.0000 mg | ORAL_TABLET | Freq: Every day | ORAL | 0 refills | Status: DC

## 2019-10-30 NOTE — Telephone Encounter (Signed)
Patient is requesting a refill on her amLODipine (NORVASC) 5 MG tablet. Patient was told by Dr. Darrick Huntsman to take two a day and is running out before the  Month is up. Pharmacy is [facility] on [address].

## 2019-10-31 ENCOUNTER — Other Ambulatory Visit: Payer: Self-pay

## 2019-10-31 ENCOUNTER — Telehealth: Payer: Self-pay | Admitting: Internal Medicine

## 2019-10-31 MED ORDER — AMLODIPINE BESYLATE 10 MG PO TABS: 10.0000 mg | ORAL_TABLET | Freq: Every day | ORAL | 1 refills | Status: DC

## 2019-10-31 NOTE — Telephone Encounter (Signed)
err

## 2019-11-08 IMAGING — MG MM DIGITAL DIAGNOSTIC UNILAT*R*
4 series · 4 of 8 positions shown · non-contrast
Comparison: Previous exam(s).

CLINICAL DATA: Right breast calcifications seen on most recent
screening mammography.

EXAM:
DIGITAL DIAGNOSTIC RIGHT MAMMOGRAM WITH CAD

[R ML]
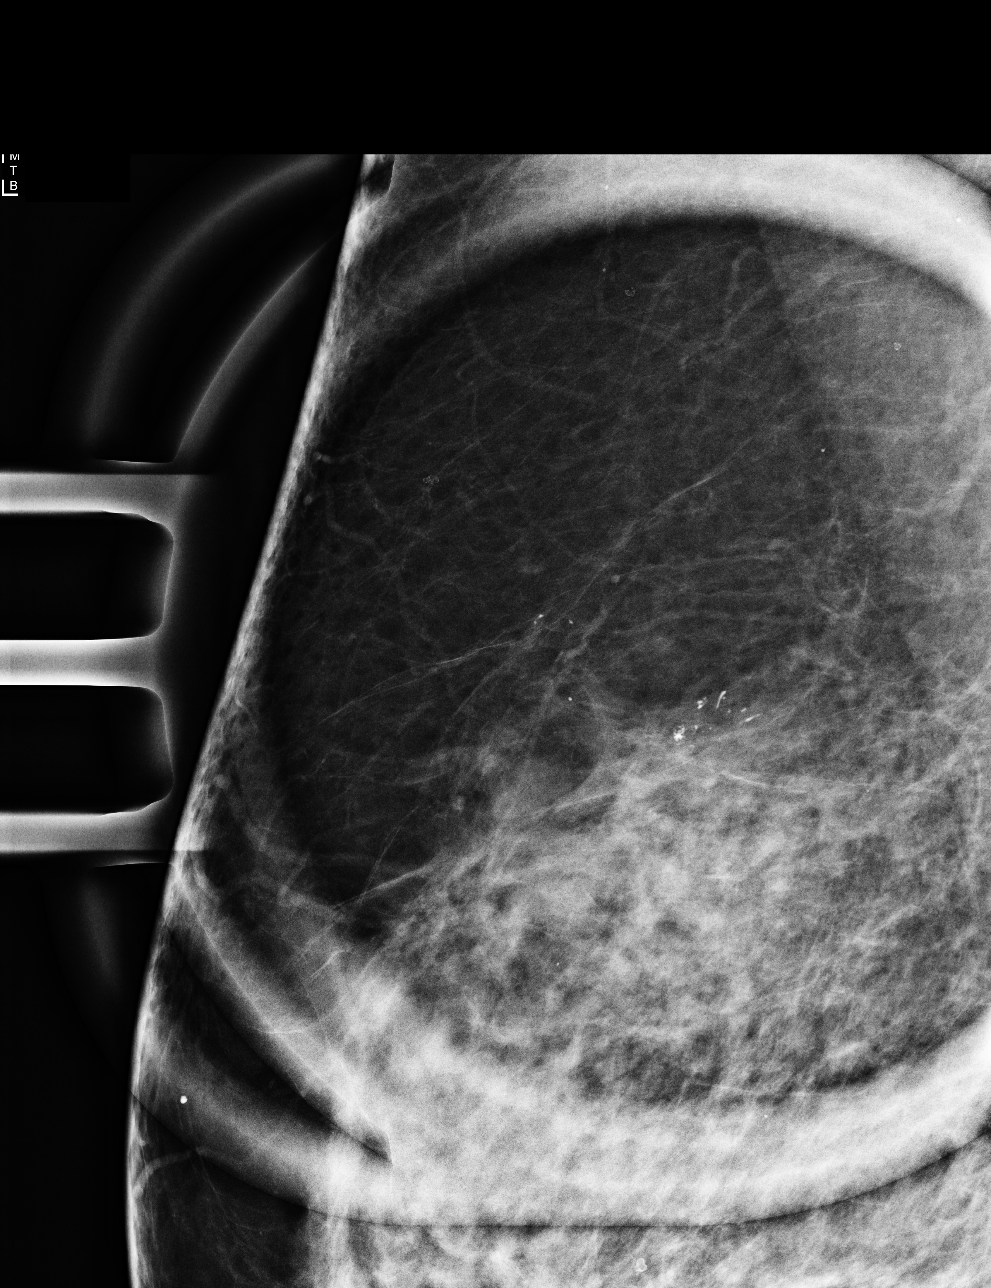

[R CC]
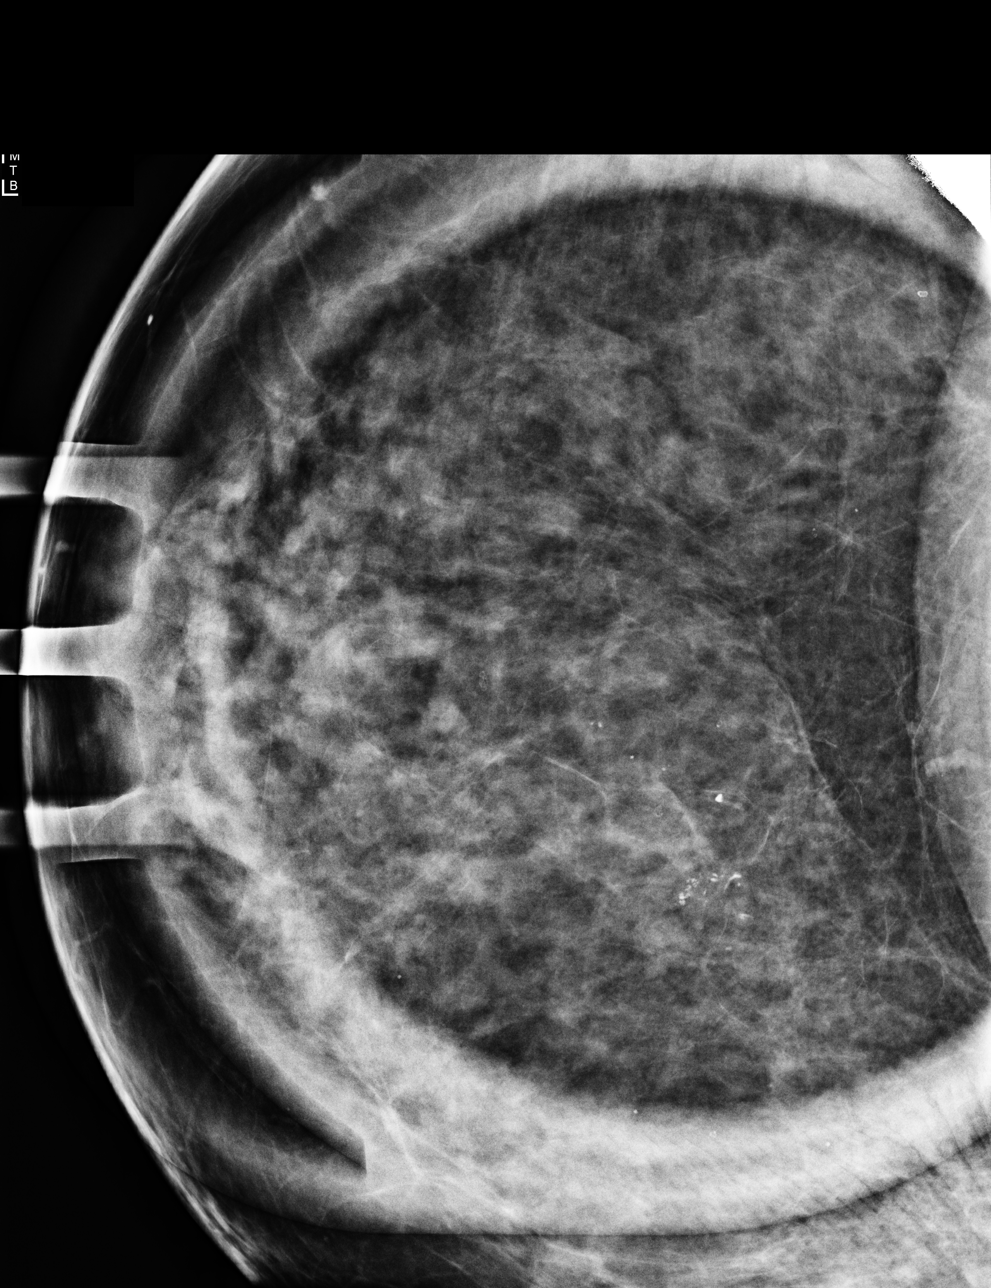

[R ML synth-2D]
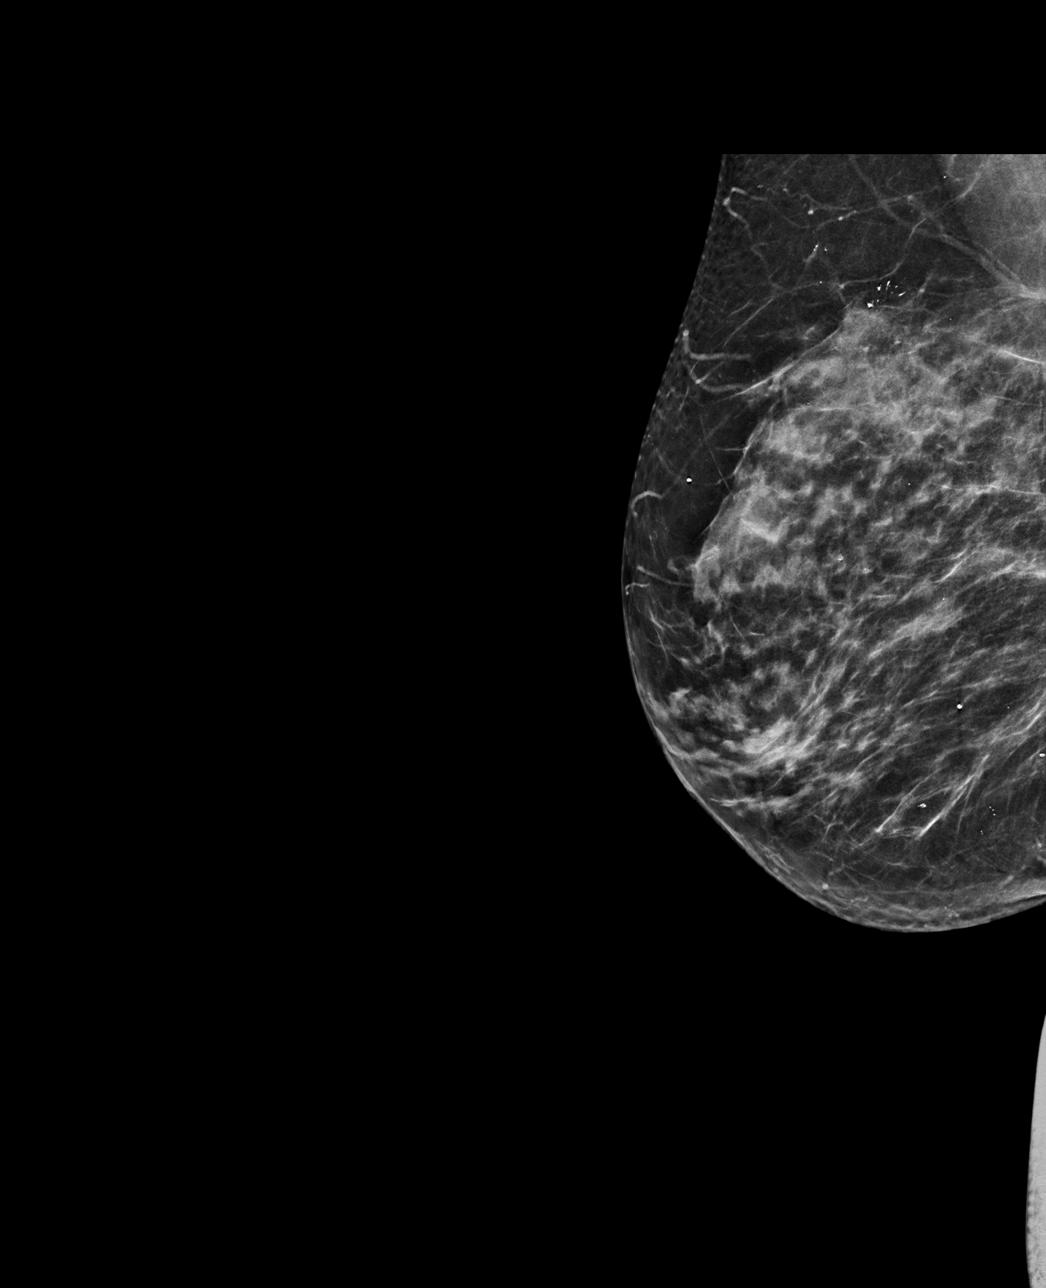

[R ML tomo · tomo slice 35/69.0]
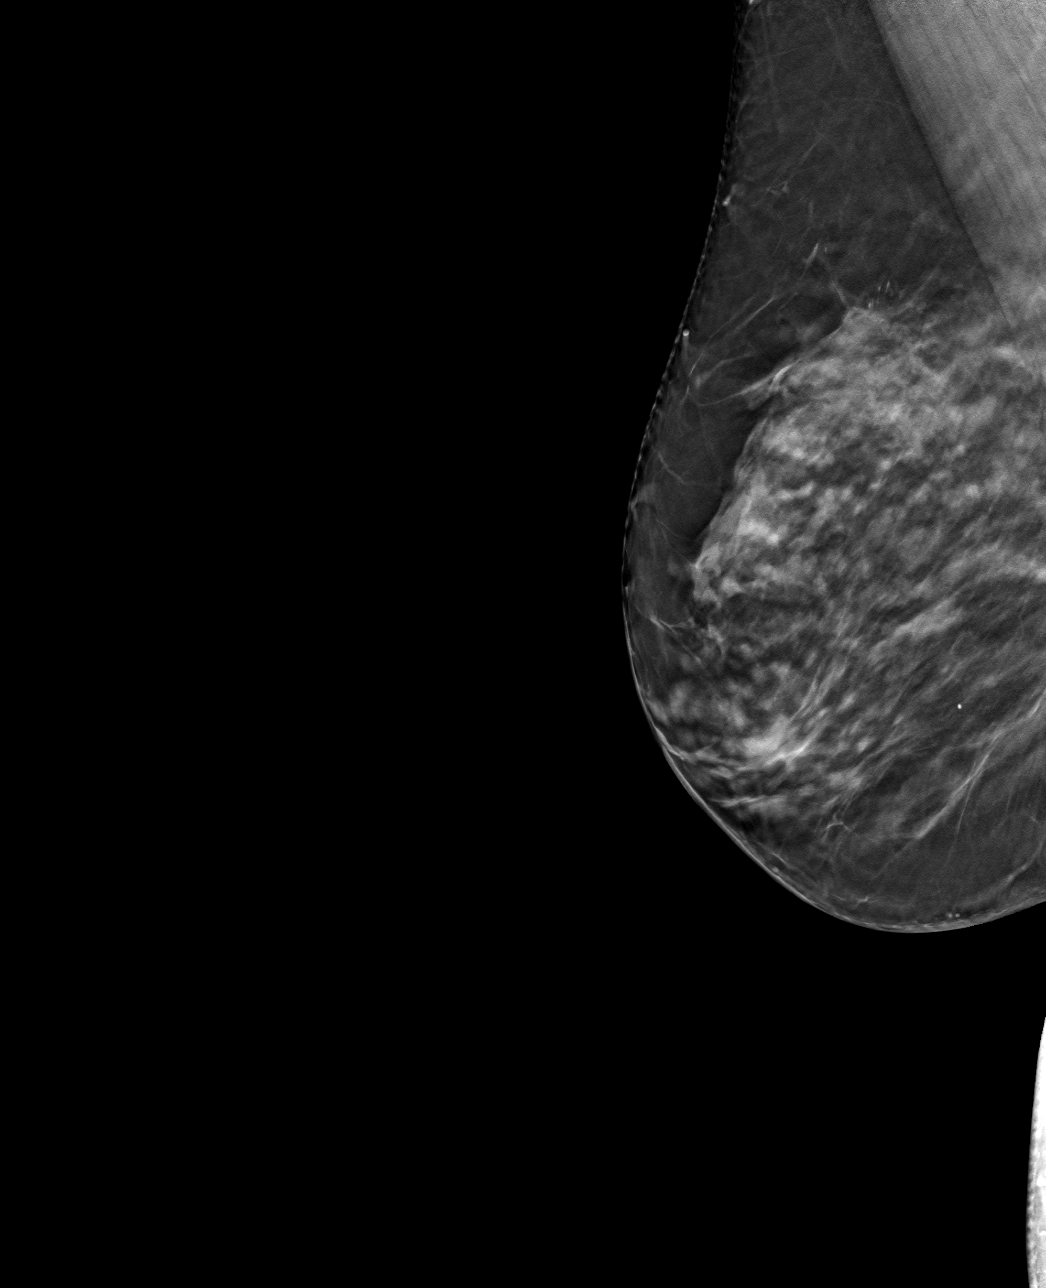

[4 of 8 positions shown; findings below may reference images not displayed]

ACR Breast Density Category c: The breast tissue is heterogeneously
dense, which may obscure small masses.
FINDINGS: Additional mammographic views of the right breast demonstrate a
group of coarse benign-appearing calcifications in the right breast
upper slightly inner quadrant, posterior depth, measuring 9 mm.

Mammographic images were processed with CAD.
IMPRESSION: Probably benign right breast calcifications, for which short-term
follow-up is recommended.

RECOMMENDATION:
Diagnostic right mammogram in 6 months.

I have discussed the findings and recommendations with the patient.
Results were also provided in writing at the conclusion of the
visit. If applicable, a reminder letter will be sent to the patient
regarding the next appointment.

BI-RADS CATEGORY  3: Probably benign.

## 2019-11-13 ENCOUNTER — Other Ambulatory Visit: Payer: Self-pay | Admitting: Internal Medicine

## 2019-11-13 DIAGNOSIS — I10 Essential (primary) hypertension: Secondary | ICD-10-CM

## 2019-12-26 ENCOUNTER — Other Ambulatory Visit: Payer: Self-pay

## 2019-12-26 ENCOUNTER — Telehealth: Payer: Self-pay | Admitting: Internal Medicine

## 2019-12-26 DIAGNOSIS — E782 Mixed hyperlipidemia: Secondary | ICD-10-CM

## 2019-12-26 DIAGNOSIS — E1151 Type 2 diabetes mellitus with diabetic peripheral angiopathy without gangrene: Secondary | ICD-10-CM

## 2019-12-26 DIAGNOSIS — E1121 Type 2 diabetes mellitus with diabetic nephropathy: Secondary | ICD-10-CM

## 2019-12-26 MED ORDER — PAROXETINE HCL 10 MG PO TABS: ORAL_TABLET | ORAL | 1 refills | Status: DC

## 2019-12-26 NOTE — Telephone Encounter (Signed)
I called patient to let her know refill was sent. Patient asked did you want her to have labs done prior to appointment in 5/28?

## 2019-12-26 NOTE — Telephone Encounter (Signed)
Pt called in need a refill on medication PARoxetine (PAXIL) 10 MG tablet she said that she is in [place] and wanted to pick it up today.

## 2019-12-26 NOTE — Telephone Encounter (Signed)
Yes,  Fasting labs have been ordered  Please schedule

## 2019-12-27 NOTE — Telephone Encounter (Signed)
Patient called and stated that she would call back to schedule. She doesn't drive, so she has to check when someone can bring her.

## 2020-01-16 ENCOUNTER — Other Ambulatory Visit: Payer: Self-pay

## 2020-01-17 ENCOUNTER — Other Ambulatory Visit: Payer: Self-pay

## 2020-01-17 ENCOUNTER — Ambulatory Visit (INDEPENDENT_AMBULATORY_CARE_PROVIDER_SITE_OTHER): Payer: Medicare Other

## 2020-01-17 ENCOUNTER — Encounter: Payer: Self-pay | Admitting: Internal Medicine

## 2020-01-17 ENCOUNTER — Ambulatory Visit (INDEPENDENT_AMBULATORY_CARE_PROVIDER_SITE_OTHER): Payer: Medicare Other | Admitting: Internal Medicine

## 2020-01-17 VITALS — Ht <= 58 in | Wt 160.0 lb

## 2020-01-17 VITALS — BP 162/78 | HR 82 | Temp 97.8°F | Ht 58.5 in | Wt 172.4 lb

## 2020-01-17 DIAGNOSIS — R059 Cough, unspecified: Secondary | ICD-10-CM

## 2020-01-17 DIAGNOSIS — Z0000 Encounter for general adult medical examination without abnormal findings: Secondary | ICD-10-CM | POA: Diagnosis not present

## 2020-01-17 DIAGNOSIS — E1121 Type 2 diabetes mellitus with diabetic nephropathy: Secondary | ICD-10-CM | POA: Diagnosis not present

## 2020-01-17 DIAGNOSIS — I150 Renovascular hypertension: Secondary | ICD-10-CM | POA: Diagnosis not present

## 2020-01-17 DIAGNOSIS — M25551 Pain in right hip: Secondary | ICD-10-CM

## 2020-01-17 DIAGNOSIS — E1151 Type 2 diabetes mellitus with diabetic peripheral angiopathy without gangrene: Secondary | ICD-10-CM | POA: Diagnosis not present

## 2020-01-17 DIAGNOSIS — Z720 Tobacco use: Secondary | ICD-10-CM

## 2020-01-17 LAB — COMPREHENSIVE METABOLIC PANEL
ALT: 15 U/L (ref 0–35)
AST: 13 U/L (ref 0–37)
Albumin: 4.1 g/dL (ref 3.5–5.2)
Alkaline Phosphatase: 115 U/L (ref 39–117)
BUN: 15 mg/dL (ref 6–23)
CO2: 29 mEq/L (ref 19–32)
Calcium: 9.1 mg/dL (ref 8.4–10.5)
Chloride: 103 mEq/L (ref 96–112)
Creatinine, Ser: 0.99 mg/dL (ref 0.40–1.20)
GFR: 56.33 mL/min — ABNORMAL LOW (ref >60.00)
Glucose, Bld: 96 mg/dL (ref 70–99)
Potassium: 3.6 mEq/L (ref 3.5–5.1)
Sodium: 140 mEq/L (ref 135–145)
Total Bilirubin: 0.4 mg/dL (ref 0.2–1.2)
Total Protein: 6.6 g/dL (ref 6.0–8.3)

## 2020-01-17 LAB — MICROALBUMIN / CREATININE URINE RATIO
Creatinine,U: 178.3 mg/dL
Microalb Creat Ratio: 36.6 mg/g — ABNORMAL HIGH (ref 0.0–30.0)
Microalb, Ur: 65.3 mg/dL — ABNORMAL HIGH (ref 0.0–1.9)

## 2020-01-17 LAB — LDL CHOLESTEROL, DIRECT: Direct LDL: 95 mg/dL

## 2020-01-17 LAB — LIPID PANEL
Cholesterol: 200 mg/dL (ref 0–200)
HDL: 48.6 mg/dL (ref >39.00)
NonHDL: 151.25
Total CHOL/HDL Ratio: 4
Triglycerides: 209 mg/dL — ABNORMAL HIGH (ref 0.0–149.0)
VLDL: 41.8 mg/dL — ABNORMAL HIGH (ref 0.0–40.0)

## 2020-01-17 LAB — HEMOGLOBIN A1C: Hgb A1c MFr Bld: 6.3 % (ref 4.6–6.5)

## 2020-01-17 MED ORDER — ALBUTEROL SULFATE 108 (90 BASE) MCG/ACT IN AEPB: 1.0000 | INHALATION_SPRAY | Freq: Three times a day (TID) | RESPIRATORY_TRACT | 0 refills | Status: DC | PRN

## 2020-01-17 NOTE — Progress Notes (Signed)
Subjective:  Patient ID: [name], female    DOB: April 08, 1956  Age: 64 y.o. MRN: [id number]  CC: The primary encounter diagnosis was Diabetic nephropathy with proteinuria ([place]). Diagnoses of DM (diabetes mellitus), type 2 with peripheral vascular complications ([place]), Right hip pain, Tobacco abuse, and Renovascular hypertension were also pertinent to this visit.     HPI BELISSA KOOY presents for follow up on chronic conditions  Including T2DM, renovascular hypertension , and tobacco abuse   This visit occurred during the SARS-CoV-2 public health emergency.  Safety protocols were in place, including screening questions prior to the visit, additional usage of staff PPE, and extensive cleaning of exam room while observing appropriate contact time as indicated for disinfecting solutions.   Has reduced tobacco use to 1/2 pack for 6 to 8 months.   [facility]  Program for tobacco cessation  Encouraged to use nicotine patches but has not tried them yet.   DM:  Not checking sugars .  Not following a low carb diet   Has gained  14 lbs since  Last year.  NOt exercising due to persistent hip pain attributed to bursitis by prior orthopedic  evaluation years ago.  Not taking anything for it,  Has not seen orthopedics in "a while" Wants to see Earnestine Leys at [facility]  HTN:  With RAS :  Took all 3 medications one hour ago.  Not checking blood pressures at home.      Outpatient Medications Prior to Visit  Medication Sig Dispense Refill  . amLODipine (NORVASC) 10 MG tablet Take 1 tablet (10 mg total) by mouth daily. 90 tablet 1  . aspirin EC 81 MG tablet Take 1 tablet (81 mg total) by mouth daily. 90 tablet 2  . atorvastatin (LIPITOR) 40 MG tablet Take 1 tablet by mouth once daily 90 tablet 3  . blood glucose meter kit and supplies Use to check blood sugars once daily.  (FOR ICD-10 E10.9, E11.9). 1 each 0  . buPROPion (WELLBUTRIN SR) 150 MG 12 hr tablet Take 1 tablet (150 mg total) by mouth 2 (two)  times daily. 180 tablet 1  . Cholecalciferol (VITAMIN D3) 1000 UNITS CAPS Take by mouth.      . Lancets (ONETOUCH DELICA PLUS [id number]) MISC USE WITH DEVICE TO TEST  ONCE DAILY 100 each 3  . losartan (COZAAR) 100 MG tablet TAKE 1 TABLET BY MOUTH  DAILY 90 tablet 3  . metoprolol succinate (TOPROL-XL) 50 MG 24 hr tablet TAKE 1 TABLET BY MOUTH ONCE DAILY (TAKE  WITH  OR  IMMEDIATELY  FOLLOWING  A  MEAL) 90 tablet 0  . ondansetron (ZOFRAN ODT) 4 MG disintegrating tablet Take 1 tablet (4 mg total) by mouth every 8 (eight) hours as needed for nausea or vomiting. 60 tablet 2  . ONETOUCH ULTRA test strip USE WITH METER TO CHECK  BLOOD SUGAR ONCE DAILY. 100 strip 3  . PARoxetine (PAXIL) 10 MG tablet TAKE 1 TABLET BY MOUTH ONCE DAILY IN THE MORNING 90 tablet 1  . Albuterol Sulfate 108 (90 Base) MCG/ACT AEPB Inhale 1 puff into the lungs every 8 (eight) hours as needed. For wheezing 3 each 0  . albuterol (PROVENTIL) (2.5 MG/3ML) 0.083% nebulizer solution Take 3 mLs (2.5 mg total) by nebulization every 8 (eight) hours as needed for wheezing or shortness of breath. (Patient not taking: Reported on 01/17/2020) 150 mL 1  . amitriptyline (ELAVIL) 25 MG tablet Take 1 tablet (25 mg total) by mouth at  bedtime. (Patient not taking: Reported on 01/17/2020) 30 tablet 1  . hydrochlorothiazide (HYDRODIURIL) 25 MG tablet Take 1 tablet (25 mg total) by mouth daily. (Patient not taking: Reported on 01/17/2020) 30 tablet 1  . potassium chloride SA (K-DUR,KLOR-CON) 20 MEQ tablet Take 1 tablet (20 mEq total) by mouth daily. (Patient not taking: Reported on 01/17/2020) 10 tablet 0  . predniSONE (DELTASONE) 10 MG tablet 6 tablets daily for 3 days ,then reduce by 1 tablet daily until gone (Patient not taking: Reported on 01/17/2020) 33 tablet 0  . vitamin E 100 UNIT capsule Take 100 Units by mouth daily.     Facility-Administered Medications Prior to Visit  Medication Dose Route Frequency Provider Last Rate Last Admin  . betamethasone  acetate-betamethasone sodium phosphate (CELESTONE) injection 3 mg  3 mg Intramuscular Once Evans, Brent M, DPM      . ipratropium-albuterol (DUONEB) 0.5-2.5 (3) MG/3ML nebulizer solution 3 mL  3 mL Nebulization Q6H Burnard Hawthorne, FNP        Review of Systems;  Patient denies headache, fevers, malaise, unintentional weight loss, skin rash, eye pain, sinus congestion and sinus pain, sore throat, dysphagia,  hemoptysis , cough, dyspnea, wheezing, chest pain, palpitations, orthopnea, edema, abdominal pain, nausea, melena, diarrhea, constipation, flank pain, dysuria, hematuria, urinary  Frequency, nocturia, numbness, tingling, seizures,  Focal weakness, Loss of consciousness,  Tremor, insomnia, depression, anxiety, and suicidal ideation.      Objective:  BP (!) 162/78   Pulse 82   Temp 97.8 F (36.6 C) (Temporal)   Ht 4' 10.5" (1.486 m)   Wt 172 lb 6.4 oz (78.2 kg)   SpO2 96%   BMI 35.42 kg/m   BP Readings from Last 3 Encounters:  01/17/20 (!) 162/78  03/11/19 (!) 162/90  02/28/19 140/90    Wt Readings from Last 3 Encounters:  01/17/20 172 lb 6.4 oz (78.2 kg)  01/17/20 160 lb (72.6 kg)  06/12/19 158 lb (71.7 kg)    General appearance: alert, cooperative and appears stated age Ears: normal TM's and external ear canals both ears Throat: lips, mucosa, and tongue normal; teeth and gums normal Neck: no adenopathy, no carotid bruit, supple, symmetrical, trachea midline and thyroid not enlarged, symmetric, no tenderness/mass/nodules Back: symmetric, no curvature. ROM normal. No CVA tenderness. Lungs: clear to auscultation bilaterally Heart: regular rate and rhythm, S1, S2 normal, no murmur, click, rub or gallop Abdomen: soft, non-tender; bowel sounds normal; no masses,  no organomegaly Pulses: 2+ and symmetric Skin: Skin color, texture, turgor normal. No rashes or lesions Lymph nodes: Cervical, supraclavicular, and axillary nodes normal.  Lab Results  Component Value Date    HGBA1C 6.3 01/17/2020   HGBA1C 6.2 01/24/2019   HGBA1C 6.1 10/15/2018    Lab Results  Component Value Date   CREATININE 0.99 01/17/2020   CREATININE 0.87 01/24/2019   CREATININE 1.05 10/15/2018    Lab Results  Component Value Date   WBC 8.1 03/12/2018   HGB 13.5 03/12/2018   HCT 39.5 03/12/2018   PLT 240.0 03/12/2018   GLUCOSE 96 01/17/2020   CHOL 200 01/17/2020   TRIG 209.0 (H) 01/17/2020   HDL 48.60 01/17/2020   LDLDIRECT 95.0 01/17/2020   LDLCALC 115 (H) 01/24/2019   ALT 15 01/17/2020   AST 13 01/17/2020   NA 140 01/17/2020   K 3.6 01/17/2020   CL 103 01/17/2020   CREATININE 0.99 01/17/2020   BUN 15 01/17/2020   CO2 29 01/17/2020   TSH 0.88 11/29/2017   HGBA1C  6.3 01/17/2020   MICROALBUR 65.3 (H) 01/17/2020    MM CLIP PLACEMENT RIGHT  Result Date: 10/23/2019 CLINICAL DATA:  Post stereotactic guided biopsy of calcifications within the upper inner posterior right breast. EXAM: DIAGNOSTIC RIGHT MAMMOGRAM POST STEREOTACTIC BIOPSY COMPARISON:  Previous exam(s). FINDINGS: Mammographic images were obtained following stereotactic guided biopsy of calcifications within the upper inner posterior right breast. A coil shaped biopsy marking clip is present and located approximately 0.9 cm superior to the biopsied calcifications within the upper inner posterior right breast. IMPRESSION: Coil shaped biopsy marking clip located 0.9 cm superior to the biopsied calcifications within the upper inner posterior right breast. Final Assessment: Post Procedure Mammograms for Marker Placement Electronically Signed   By: Everlean Alstrom M.D.   On: 10/23/2019 13:35   MM RT BREAST BX W LOC DEV 1ST LESION IMAGE BX SPEC STEREO GUIDE  Addendum Date: 10/29/2019   ADDENDUM REPORT: 10/28/2019 11:51 ADDENDUM: PATHOLOGY revealed: A. BREAST, RIGHT UPPER INNER QUADRANT, POSTERIOR; STEREOTACTIC CORE BIOPSY: - BENIGN MAMMARY PARENCHYMA WITH FIBROADENOMATOID CHANGES AND ASSOCIATED COARSE CALCIFICATIONS. -  NEGATIVE FOR ATYPICAL PROLIFERATIVE BREAST DISEASE. Pathology results are CONCORDANT with imaging findings, per Dr. Everlean Alstrom. Multiple attempts to contact patient were unsuccessful. Notified patient's provider office, on 10/25/2019, requesting they contact patient with biopsy results. Recommendation: Return to annual bilateral screening mammogram (February 2022). Addendum by Electa Sniff RN on 10/28/2019. Electronically Signed   By: Everlean Alstrom M.D.   On: 10/28/2019 11:51   Result Date: 10/29/2019 CLINICAL DATA:  64 year old female with indeterminate calcifications in the upper inner posterior right breast. EXAM: RIGHT BREAST STEREOTACTIC CORE NEEDLE BIOPSY COMPARISON:  Previous exams. FINDINGS: The patient and I discussed the procedure of stereotactic-guided biopsy including benefits and alternatives. We discussed the high likelihood of a successful procedure. We discussed the risks of the procedure including infection, bleeding, tissue injury, clip migration, and inadequate sampling. Informed written consent was given. The usual time out protocol was performed immediately prior to the procedure. Using sterile technique and 1% Lidocaine as local anesthetic, under stereotactic guidance, a 9 gauge vacuum assisted device was used to perform core needle biopsy of calcifications in the upper inner posterior right breast using a superior to inferior approach. Specimen radiograph was performed showing the presence of calcifications. Specimens with calcifications are identified for pathology. Lesion quadrant: Upper inner At the conclusion of the procedure, coil shaped tissue marker clip was deployed into the biopsy cavity. Follow-up 2-view mammogram was performed and dictated separately. IMPRESSION: Stereotactic-guided biopsy of calcifications in the upper inner posterior right breast. No apparent complications. Electronically Signed: By: Everlean Alstrom M.D. On: 10/23/2019 13:25    Assessment & Plan:    Problem List Items Addressed This Visit      Unprioritized   Diabetic nephropathy with proteinuria ([place]) - Primary   Relevant Orders   Microalbumin / creatinine urine ratio (Completed)   DM (diabetes mellitus), type 2 with peripheral vascular complications ([place])    Diagnosed in April 2018.  Thus far well managed with  diet .  Smoking cessation strongly  Advised and in progress.  Continue  baby aspirin.  Continue lipitor, losartan.  Repeat labs reviewed   .  Low glycemic index diet recommended.   Lab Results  Component Value Date   HGBA1C 6.3 01/17/2020   Lab Results  Component Value Date   ALT 15 01/17/2020   AST 13 01/17/2020   ALKPHOS 115 01/17/2020   BILITOT 0.4 01/17/2020   Lab Results  Component Value Date  CREATININE 0.99 01/17/2020   Lab Results  Component Value Date   MICROALBUR 65.3 (H) 01/17/2020         Relevant Orders   Hemoglobin A1c (Completed)   Lipid panel (Completed)   Comprehensive metabolic panel (Completed)   Renovascular hypertension    His BP is not at goal currently,  Non Compliance , suspected.  Asked to change metoprolol dosing to nighttime and return in 2 weeks for BP check \ Lab Results  Component Value Date   CREATININE 0.99 01/17/2020    Lab Results  Component Value Date   NA 140 01/17/2020   K 3.6 01/17/2020   CL 103 01/17/2020   CO2 29 01/17/2020         Tobacco abuse    She has reduced daily consumption to half pack .  Encouraged to continue gradual reductio n       Other Visit Diagnoses    Right hip pain       Relevant Orders   Ambulatory referral to Orthopedic Surgery      I have discontinued Derotha R. Wimmer's vitamin E, amitriptyline, potassium chloride SA, hydrochlorothiazide, and predniSONE. I am also having her maintain her Vitamin D3, albuterol, aspirin EC, buPROPion, blood glucose meter kit and supplies, ondansetron, OneTouch Ultra, OneTouch Delica Plus [id number], atorvastatin, losartan, amLODipine, metoprolol  succinate, and PARoxetine. We will continue to administer ipratropium-albuterol and betamethasone acetate-betamethasone sodium phosphate.  No orders of the defined types were placed in this encounter.   Medications Discontinued During This Encounter  Medication Reason  . amitriptyline (ELAVIL) 25 MG tablet Patient has not taken in last 30 days  . hydrochlorothiazide (HYDRODIURIL) 25 MG tablet Patient has not taken in last 30 days  . potassium chloride SA (K-DUR,KLOR-CON) 20 MEQ tablet No longer needed (for PRN medications)  . predniSONE (DELTASONE) 10 MG tablet One time medication  . vitamin E 100 UNIT capsule Patient Preference    Follow-up: Return in about 2 weeks (around 01/31/2020).   Crecencio Mc, MD

## 2020-01-17 NOTE — Patient Instructions (Addendum)
  [name] Fields , Thank you for taking time to come for your Medicare Wellness Visit. I appreciate your ongoing commitment to your health goals. Please review the following plan we discussed and let me know if I can assist you in the future.   These are the goals we discussed: Goals      Patient Stated   . Increase physical activity (pt-stated)     Ride 3 wheel bike for exercise      Other   . Quit Smoking     Currently smokes 1/2 pack daily       This is a list of the screening recommended for you and due dates:  Health Maintenance  Topic Date Due  . COVID-19 Vaccine (1) Never done  . HIV Screening  Never done  . Complete foot exam   03/13/2019  . Hemoglobin A1C  07/26/2019  . Eye exam for diabetics  10/30/2019  . Flu Shot  03/22/2020  . Mammogram  04/05/2020  . Tetanus Vaccine  07/24/2021  . Colon Cancer Screening  03/19/2022  .  Hepatitis C: One time screening is recommended by Center for Disease Control  (CDC) for  adults born from [birth year] through 1966.   Completed

## 2020-01-17 NOTE — Progress Notes (Addendum)
Subjective:   Patricia Fields is a 64 y.o. female who presents for Medicare Annual (Subsequent) preventive examination.  Review of Systems:  No ROS.  Medicare Wellness Virtual Visit.  Visual/audio telehealth visit, UTA vital signs.  Ht/Wt provided. See social history for additional risk factors.   Cardiac Risk Factors include: diabetes mellitus;sedentary lifestyle;smoking/ tobacco exposure     Objective:     Vitals: Ht 4' 10"  (1.473 m)   Wt 160 lb (72.6 kg)   BMI 33.44 kg/m   Body mass index is 33.44 kg/m.  Advanced Directives 01/17/2020 01/16/2019 01/09/2018 12/16/2016 12/08/2016 03/05/2015  Does Patient Have a Medical Advance Directive? No Yes No No No No  Type of Advance Directive - Healthcare Power of [place];Living will - - - -  Does patient want to make changes to medical advance directive? - No - Patient declined - - - -  Copy of [place] in Chart? - Yes - validated most recent copy scanned in chart (See row information) - - - -  Would patient like information on creating a medical advance directive? No - Patient declined - No - Patient declined Yes (MAU/Ambulatory/Procedural Areas - Information given) - -    Tobacco Social History   Tobacco Use  Smoking Status Current Every Day Smoker  . Packs/day: 0.50  . Types: Cigarettes  Smokeless Tobacco Never Used     Ready to quit: Not Answered Counseling given: Not Answered   Clinical Intake:  Pre-visit preparation completed: Yes        Diabetes: Yes(Followed by pcp)  How often do you need to have someone help you when you read instructions, pamphlets, or other written materials from your doctor or pharmacy?: 1 - Never  Interpreter Needed?: No     Past Medical History:  Diagnosis Date  . 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 Chronic kidney disease    renal stent  . COPD (chronic obstructive pulmonary disease) ([place])   . History of nephrolithiasis   . HOH (hard  of hearing)   . Hyperlipidemia   . Hypertension   . Interstitial cystitis   . Meniere disease   . Peripheral arterial disease [facility]) Feb 2011   by renal artery duplex  . Tobacco abuse    one pack daily   Past Surgical History:  Procedure Laterality Date  . ABDOMINAL HYSTERECTOMY    . arthroscopy     left knee , remote  . BREAST BIOPSY Right 10/23/2019   stereo biopsy calcs/coil clip/ path pending  . BREAST EXCISIONAL BIOPSY Left 1980's   NEG  . BREAST SURGERY     left breast cyst, benign  . CARPAL TUNNEL RELEASE     Dr. Sabra Heck,  right hand   . CATARACT EXTRACTION W/PHACO Right 03/05/2015   Procedure: CATARACT EXTRACTION PHACO AND INTRAOCULAR LENS PLACEMENT (IOC);  Surgeon: Lyla Glassing, MD;  Location: [facility] ORS;  Service: Ophthalmology;  Laterality: Right;  [place]: 00:52.3 AP%: 15.2 CDE: 7.96  . KNEE ARTHROSCOPY Left   . TOTAL ABDOMINAL HYSTERECTOMY W/ BILATERAL SALPINGOOPHORECTOMY  1992   due to uterine tumor, and endometriosis    Family History  Problem Relation Age of Onset  . Coronary artery disease Mother   . COPD Mother   . Heart disease Mother   . Breast cancer Maternal Grandmother   . Multiple sclerosis Sister   . Stroke Sister    Social History   Socioeconomic History  . Marital status: Single  Spouse name: Not on file  . Number of children: Not on file  . Years of education: Not on file  . Highest education level: Not on file  Occupational History  . Not on file  Tobacco Use  . Smoking status: Current Every Day Smoker    Packs/day: 0.50    Types: Cigarettes  . Smokeless tobacco: Never Used  Substance and Sexual Activity  . Alcohol use: No    Alcohol/week: 0.0 standard drinks  . Drug use: No  . Sexual activity: Not Currently  Other Topics Concern  . Not on file  Social History Narrative  . Not on file   Social Determinants of Health   Financial Resource Strain:   . Difficulty of Paying Living Expenses:   Food Insecurity:   . Worried About  Charity fundraiser in the Last Year:   . Arboriculturist in the Last Year:   Transportation Needs:   . Film/video editor (Medical):   [name] Lack of Transportation (Non-Medical):   Physical Activity:   . Days of Exercise per Week:   . Minutes of Exercise per Session:   Stress:   . Feeling of Stress :   Social Connections:   . Frequency of Communication with Friends and Family:   . Frequency of Social Gatherings with Friends and Family:   . Attends Religious Services:   . Active Member of Clubs or Organizations:   . Attends Archivist Meetings:   [name] Marital Status:     Outpatient Encounter Medications as of 01/17/2020  Medication Sig  . albuterol (PROVENTIL) (2.5 MG/3ML) 0.083% nebulizer solution Take 3 mLs (2.5 mg total) by nebulization every 8 (eight) hours as needed for wheezing or shortness of breath.  . Albuterol Sulfate 108 (90 Base) MCG/ACT AEPB Inhale 1 puff into the lungs every 8 (eight) hours as needed. For wheezing  . amitriptyline (ELAVIL) 25 MG tablet Take 1 tablet (25 mg total) by mouth at bedtime.  [name] amLODipine (NORVASC) 10 MG tablet Take 1 tablet (10 mg total) by mouth daily.  [name] aspirin EC 81 MG tablet Take 1 tablet (81 mg total) by mouth daily.  [name] atorvastatin (LIPITOR) 40 MG tablet Take 1 tablet by mouth once daily  . blood glucose meter kit and supplies Use to check blood sugars once daily.  (FOR ICD-10 E10.9, E11.9).  [name] buPROPion (WELLBUTRIN SR) 150 MG 12 hr tablet Take 1 tablet (150 mg total) by mouth 2 (two) times daily.  . Cholecalciferol (VITAMIN D3) 1000 UNITS CAPS Take by mouth.    . hydrochlorothiazide (HYDRODIURIL) 25 MG tablet Take 1 tablet (25 mg total) by mouth daily.  . Lancets (ONETOUCH DELICA PLUS [id number]) MISC USE WITH DEVICE TO TEST  ONCE DAILY  . losartan (COZAAR) 100 MG tablet TAKE 1 TABLET BY MOUTH  DAILY  . metoprolol succinate (TOPROL-XL) 50 MG 24 hr tablet TAKE 1 TABLET BY MOUTH ONCE DAILY (TAKE  WITH  OR  IMMEDIATELY  FOLLOWING  A   MEAL)  . ondansetron (ZOFRAN ODT) 4 MG disintegrating tablet Take 1 tablet (4 mg total) by mouth every 8 (eight) hours as needed for nausea or vomiting.  Glory Rosebush ULTRA test strip USE WITH METER TO CHECK  BLOOD SUGAR ONCE DAILY.  [name] PARoxetine (PAXIL) 10 MG tablet TAKE 1 TABLET BY MOUTH ONCE DAILY IN THE MORNING  . potassium chloride SA (K-DUR,KLOR-CON) 20 MEQ tablet Take 1 tablet (20 mEq total) by mouth daily.  . predniSONE (  DELTASONE) 10 MG tablet 6 tablets daily for 3 days ,then reduce by 1 tablet daily until gone  . vitamin E 100 UNIT capsule Take 100 Units by mouth daily.  . [DISCONTINUED] azithromycin (ZITHROMAX) 500 MG tablet Take 1 tablet (500 mg total) by mouth daily.  . [DISCONTINUED] benzonatate (TESSALON PERLES) 100 MG capsule Take 1 capsule (100 mg total) by mouth 3 (three) times daily as needed for cough.   Facility-Administered Encounter Medications as of 01/17/2020  Medication  . betamethasone acetate-betamethasone sodium phosphate (CELESTONE) injection 3 mg  . ipratropium-albuterol (DUONEB) 0.5-2.5 (3) MG/3ML nebulizer solution 3 mL    Activities of Daily Living In your present state of health, do you have any difficulty performing the following activities: 01/17/2020  Hearing? Y  Comment Hearing aid  Vision? N  Difficulty concentrating or making decisions? N  Walking or climbing stairs? N  Dressing or bathing? N  Doing errands, shopping? Y  Comment She does not Physiological scientist and eating ? N  Using the Toilet? N  In the past six months, have you accidently leaked urine? N  Do you have problems with loss of bowel control? N  Managing your Medications? N  Managing your Finances? N  Housekeeping or managing your Housekeeping? N  Some recent data might be hidden    Patient Care Team: Crecencio Mc, MD as PCP - General (Internal Medicine)    Assessment:   This is a routine wellness examination for Faithanne.  Nurse connected with patient 01/17/20 at 10:00 AM  EDT by a telephone enabled telemedicine application and verified that I am speaking with the correct person using two identifiers. Patient stated full name and DOB. Patient gave permission to continue with virtual visit. Patient's location was at home and Nurse's location was at [place] office.   Patient is alert and oriented x3. Patient denies difficulty focusing or concentrating. Patient likes to play brain challenging games.  Health Maintenance Due: -Covid vaccine- completed. Bring record to next visit.  -HIV- followed by pcp. -Foot Exam- denies any wounds, changes. Followed by pcp. -Hgb A1c-10/15/18 (6.1) See completed HM at the end of note.   Eye: Visual acuity not assessed. Virtual visit. Followed by their ophthalmologist. Retinopathy- none reported.  Hearing: Hearing aids- yes  Safety:  Patient feels safe at home- yes Patient does have smoke detectors at home- yes Patient does wear sunscreen or protective clothing when in direct sunlight - yes Patient does wear seat belt when in a moving vehicle - yes Patient drives- no Adequate lighting in walkways free from debris- yes Grab bars and handrails used as appropriate- yes Ambulates with an assistive device- yes; cane Cell phone on person when ambulating outside of the home- yes  Social: Alcohol intake - no     Smoking history- current Smokers in home? none Illicit drug use? none  Medication: Taking as directed and without issues.  Self managed - yes   Covid-19: Precautions and sickness symptoms discussed. Wears mask, social distancing, hand hygiene as appropriate.   Activities of Daily Living Patient denies needing assistance with: household chores, feeding themselves, getting from bed to chair, getting to the toilet, bathing/showering, dressing, managing money, or preparing meals.   Discussed the importance of a healthy diet, water intake and the benefits of aerobic exercise.  Physical activity- active in the  home.  Diet:  Regular Water: poor intake, plans to increase Caffeine: 3-4 coffee  Other Providers Patient Care Team: Crecencio Mc, MD as PCP -  General (Internal Medicine)  Exercise Activities and Dietary recommendations Current Exercise Habits: The patient does not participate in regular exercise at present  Goals      Patient Stated   . Increase physical activity (pt-stated)     Ride 3 wheel bike for exercise      Other   . Quit Smoking     Currently smokes 1/2 pack daily       Fall Risk Fall Risk  01/17/2020 06/12/2019 03/07/2019 02/13/2019 01/09/2018  Falls in the past year? 1 1 1  0 Yes  Number falls in past yr: 1 1 1  0 1  Injury with Fall? 1 0 0 - No  Risk Factor Category  - - - - -  Risk for fall due to : Impaired balance/gait - - - History of fall(s)  Follow up Falls evaluation completed Falls evaluation completed Falls evaluation completed Falls evaluation completed Education provided;Falls prevention discussed   Is the patient's home free of loose throw rugs in walkways, pet beds, electrical cords, etc?  Yes      Grab bars in the bathroom? Yes      Handrails on the stairs?  Yes      Adequate lighting? Yes  Timed Get Up and Go performed: No, virtual visit  Depression Screen PHQ 2/9 Scores 01/17/2020 03/07/2019 02/13/2019 01/16/2019  PHQ - 2 Score 0 0 0 0  PHQ- 9 Score - 0 - -     Cognitive Function MMSE - Mini Mental State Exam 12/16/2016  Orientation to time 5  Orientation to Place 5  Registration 3  Attention/ Calculation 5  Recall 3  Language- name 2 objects 2  Language- repeat 1  Language- follow 3 step command 3  Language- read & follow direction 1  Write a sentence 1  Copy design 1  Total score 30     6CIT Screen 01/17/2020 01/16/2019 01/09/2018  What Year? 0 points 0 points 0 points  What month? 0 points 0 points 0 points  What time? - 0 points 0 points  Count back from 20 0 points 0 points 0 points  Months in reverse 2 points 0 points 0  points  Repeat phrase 2 points 0 points 0 points  Total Score - 0 0    Immunization History  Administered Date(s) Administered  . Influenza Split 07/25/2011, 09/27/2012  . Influenza,inj,Quad PF,6+ Mos 05/29/2013, 08/14/2014, 11/06/2015, 09/19/2016, 07/12/2018  . Pneumococcal Conjugate-13 05/29/2013  . Pneumococcal Polysaccharide-23 08/14/2014  . Tdap 07/25/2011  . Zoster 08/20/2014   Screening Tests Health Maintenance  Topic Date Due  . COVID-19 Vaccine (1) Never done  . HIV Screening  Never done  . FOOT EXAM  03/13/2019  . HEMOGLOBIN A1C  07/26/2019  . OPHTHALMOLOGY EXAM  10/30/2019  . INFLUENZA VACCINE  03/22/2020  . MAMMOGRAM  04/05/2020  . TETANUS/TDAP  07/24/2021  . COLONOSCOPY  03/19/2022  . Hepatitis C Screening  Completed      Plan:    Keep all routine maintenance appointments.   Follow up with your doctor today @ 10:30.  Medicare Attestation I have personally reviewed: The patient's medical and social history Their use of alcohol, tobacco or illicit drugs Their current medications and supplements The patient's functional ability including ADLs,fall risks, home safety risks, cognitive, and hearing and visual impairment Diet and physical activities Evidence for depression   I have reviewed and discussed with patient certain preventive protocols, quality metrics, and best practice recommendations.     OBrien-Blaney, Jorge Amparo L, LPN  01/17/2020     I have reviewed the above information and agree with above.   Deborra Medina, MD

## 2020-01-17 NOTE — Patient Instructions (Addendum)
yOUR BLOOD PRESSURE IS 30 PTS too high  Take your metoprolol at bedtime  Continue losartan and the amlodipine in the morning  Return in 2 weeks for an RN visit to check blood pressure  GOOD WORK ON REDUCING YOUR CIGARETTE USE! KEEP TRYING TO REDUCE GRADUALLY        This is  Dr. Lupita Dawn  example of a  "Low GI"  Diet:  It will allow you to lose 4 to 8  lbs  per month if you follow it carefully.  Your goal with exercise is a minimum of 30 minutes of aerobic exercise 5 days per week (Walking does not count once it becomes easy!)    All of the foods can be found at grocery stores and in bulk at [employer].  The Atkins protein bars and shakes are available in more varieties at [employer], [employer] and [place].     7 AM Breakfast:  Choose from the following:  Low carbohydrate Protein  Shakes (I recommend the  Premier Protein chocolate shakes,  EAS AdvantEdge "Carb Control" shakes  Or the Atkins shakes all are under 3 net carbs)     a scrambled egg/bacon/cheese burrito made with Mission's "carb balance" whole wheat tortilla  (about 10 net carbs )  Regulatory affairs officer (basically a quiche without the pastry crust) that is eaten cold and very convenient way to get your eggs.  8 carbs)  If you make your own protein shakes, avoid bananas and pineapple,  And use low carb greek yogurt or original /unsweetened almond or soy milk    Avoid cereal and bananas, oatmeal and cream of wheat and grits. They are loaded with carbohydrates!   10 AM: high protein snack:  Protein bar by Atkins (the snack size, under 200 cal, usually < 6 net carbs).    A stick of cheese:  Around 1 carb,  100 cal     Dannon Light n Fit [place] Yogurt  (80 cal, 8 carbs)  Other so called "protein bars" and Greek yogurts tend to be loaded with carbohydrates.  Remember, in food advertising, the word "energy" is synonymous for " carbohydrate."  Lunch:   A Sandwich using the bread choices listed, Can use any  Eggs,   lunchmeat, grilled meat or canned tuna), avocado, regular mayo/mustard  and cheese.  A Salad using blue cheese, ranch,  Goddess or vinagrette,  Avoid taco shells, croutons or "confetti" and no "candied nuts" but regular nuts OK.   No pretzels, nabs  or chips.  Pickles and miniature sweet peppers are a good low carb alternative that provide a "crunch"  The bread is the only source of carbohydrate in a sandwich and  can be decreased by trying some of the attached alternatives to traditional loaf bread   Avoid "Low fat dressings, as well as [place] dressings They are loaded with sugar!   3 PM/ Mid day  Snack:  Consider  1 ounce of  almonds, walnuts, pistachios, pecans, peanuts,  Macadamia nuts or a nut medley.  Avoid "granola and granola bars "  Mixed nuts are ok in moderation as long as there are no raisins,  cranberries or dried fruit.   KIND bars are OK if you get the low glycemic index variety   Try the prosciutto/mozzarella cheese sticks by F[employer]  In deli /backery section   High protein      6 PM  Dinner:     Meat/fowl/fish with a  green salad, and either broccoli, cauliflower, green beans, spinach, brussel sprouts or  Lima beans. DO NOT BREAD THE PROTEIN!!      There is a low carb pasta by Dreamfield's that is acceptable and tastes great: only 5 digestible carbs/serving.( All grocery stores but BJs carry it ) Several ready made meals are available low carb:   Try Michel Angelo's chicken piccata or chicken or eggplant parm over low carb pasta.(Lowes and BJs)   Clifton Custard Sanchez's "Carnitas" (pulled pork, no sauce,  0 carbs) or his beef pot roast to make a dinner burrito (at [employer]J's)  Pesto over low carb pasta (bj's sells a good quality pesto in the center refrigerated section of the deli   Try satueeing  [place] Harps with mushroooms as a good side   [employer] Giant makes a mashed cauliflower that tastes like mashed potatoes  Whole wheat pasta is still full of digestible carbs and   Not as low in glycemic index as Dreamfield's.   Brown rice is still rice,  So skip the rice and noodles if you eat [place] or [place] (or at least limit to 1/2 cup)  9 PM snack :   Breyer's "low carb" fudgsicle or  ice cream bar (Carb Smart line), or  Weight Watcher's ice cream bar , or another "no sugar added" ice cream;  a serving of fresh berries/cherries with whipped cream   Cheese or DANNON'S LlGHT N FIT GREEK YOGURT  8 ounces of [place] unsweetened almond/cococunut milk    Treat yourself to a parfait made with whipped cream blueberiies, walnuts and vanilla greek yogurt  Avoid bananas, pineapple, grapes  and watermelon on a regular basis because they are high in sugar.  THINK OF THEM AS DESSERT  Remember that snack Substitutions should be less than 10 NET carbs per serving and meals < 20 carbs. Remember to subtract fiber grams to get the "net carbs."

## 2020-01-19 ENCOUNTER — Encounter: Payer: Self-pay | Admitting: Internal Medicine

## 2020-01-19 NOTE — Assessment & Plan Note (Signed)
She has reduced daily consumption to half pack .  Encouraged to continue gradual reduction   

## 2020-01-19 NOTE — Assessment & Plan Note (Addendum)
Diagnosed in April 2018.  Thus far well managed with  diet .  Smoking cessation strongly  Advised and in progress.  Continue  baby aspirin.  Continue lipitor, losartan.  Repeat labs reviewed   .  Low glycemic index diet recommended.   Lab Results  Component Value Date   HGBA1C 6.3 01/17/2020   Lab Results  Component Value Date   ALT 15 01/17/2020   AST 13 01/17/2020   ALKPHOS 115 01/17/2020   BILITOT 0.4 01/17/2020   Lab Results  Component Value Date   CREATININE 0.99 01/17/2020   Lab Results  Component Value Date   MICROALBUR 65.3 (H) 01/17/2020

## 2020-01-19 NOTE — Assessment & Plan Note (Addendum)
His BP is not at goal currently,  Non Compliance , suspected.  Asked to change metoprolol dosing to nighttime and return in 2 weeks for BP check \ Lab Results  Component Value Date   CREATININE 0.99 01/17/2020    Lab Results  Component Value Date   NA 140 01/17/2020   K 3.6 01/17/2020   CL 103 01/17/2020   CO2 29 01/17/2020

## 2020-01-24 ENCOUNTER — Telehealth: Payer: Self-pay | Admitting: Internal Medicine

## 2020-01-24 NOTE — Telephone Encounter (Signed)
Patricia Fields,  There was no message attached to this   TT

## 2020-01-30 ENCOUNTER — Ambulatory Visit: Payer: Medicare Other | Admitting: Internal Medicine

## 2020-02-03 NOTE — Telephone Encounter (Signed)
"  Rejection Reason - Patient did not respond" [facility], PA - [place] said about 6 hours ago   

## 2020-02-05 DIAGNOSIS — Z0100 Encounter for examination of eyes and vision without abnormal findings: Secondary | ICD-10-CM | POA: Diagnosis not present

## 2020-02-05 DIAGNOSIS — E109 Type 1 diabetes mellitus without complications: Secondary | ICD-10-CM | POA: Diagnosis not present

## 2020-02-05 DIAGNOSIS — H524 Presbyopia: Secondary | ICD-10-CM | POA: Diagnosis not present

## 2020-02-05 DIAGNOSIS — I10 Essential (primary) hypertension: Secondary | ICD-10-CM | POA: Diagnosis not present

## 2020-02-14 ENCOUNTER — Encounter: Payer: Self-pay | Admitting: Internal Medicine

## 2020-02-14 ENCOUNTER — Other Ambulatory Visit: Payer: Self-pay

## 2020-02-14 ENCOUNTER — Ambulatory Visit (INDEPENDENT_AMBULATORY_CARE_PROVIDER_SITE_OTHER): Payer: Medicare HMO | Admitting: Internal Medicine

## 2020-02-14 VITALS — BP 162/92 | HR 74 | Temp 98.3°F | Resp 16 | Ht 58.5 in | Wt 173.0 lb

## 2020-02-14 DIAGNOSIS — E669 Obesity, unspecified: Secondary | ICD-10-CM

## 2020-02-14 DIAGNOSIS — R42 Dizziness and giddiness: Secondary | ICD-10-CM | POA: Diagnosis not present

## 2020-02-14 DIAGNOSIS — H8120 Vestibular neuronitis, unspecified ear: Secondary | ICD-10-CM | POA: Diagnosis not present

## 2020-02-14 DIAGNOSIS — I150 Renovascular hypertension: Secondary | ICD-10-CM | POA: Diagnosis not present

## 2020-02-14 NOTE — Progress Notes (Signed)
Subjective:  Patient ID: Patricia Fields, female    DOB: [birth year]  Age: 64 y.o. MRN: [id number]  CC: The primary encounter diagnosis was Vertigo. Diagnoses of Subacute vestibular neuronitis, unspecified laterality, Renovascular hypertension, and Obesity (BMI 30.0-34.9) were also pertinent to this visit.  HPI Patricia Fields presents for follow up on hypertension and other issues  This visit occurred during the SARS-CoV-2 public health emergency.  Safety protocols were in place, including screening questions prior to the visit, additional usage of staff PPE, and extensive cleaning of exam room while observing appropriate contact time as indicated for disinfecting solutions.     1) HTN: .Patient is taking her medications as prescribed and notes no adverse effects.  Home BP readings have not been done .  She is avoiding added salt in her diet and not waling or exercising .  She has not taken all of her medications in the last 24 hours ,  Has run out of amlodipine.  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  Has good days and bad days aggravated by changes in barometric pressure,  Can't function  on on the bad days,  Discussed trial of betahistine and referral to [facility]NT if no improvement.   3)  Obesity: she has gained 16 lbs in the last several months.  She is not exercising regularly or watching her diet.    Outpatient Medications Prior to Visit  Medication Sig Dispense Refill  . albuterol (PROVENTIL) (2.5 MG/3ML) 0.083% nebulizer solution Take 3 mLs (2.5 mg total) by nebulization every 8 (eight) hours as needed for wheezing or shortness of breath. 150 mL 1  . Albuterol Sulfate (PROAIR RESPICLICK) 423 (90 Base) MCG/ACT AEPB Inhale 1 puff into the lungs every 8 (eight) hours as needed. For wheezing 3 each 0  . amLODipine (NORVASC) 10 MG tablet Take 1 tablet (10 mg total) by mouth daily. 90 tablet 1  . aspirin EC 81 MG tablet Take 1 tablet (81 mg total) by mouth daily. 90  tablet 2  . atorvastatin (LIPITOR) 40 MG tablet Take 1 tablet by mouth once daily 90 tablet 3  . blood glucose meter kit and supplies Use to check blood sugars once daily.  (FOR ICD-10 E10.9, E11.9). 1 each 0  . buPROPion (WELLBUTRIN SR) 150 MG 12 hr tablet Take 1 tablet (150 mg total) by mouth 2 (two) times daily. 180 tablet 1  . Cholecalciferol (VITAMIN D3) 1000 UNITS CAPS Take by mouth.      . Lancets (ONETOUCH DELICA PLUS [id number]) MISC USE WITH DEVICE TO TEST  ONCE DAILY 100 each 3  . losartan (COZAAR) 100 MG tablet TAKE 1 TABLET BY MOUTH  DAILY 90 tablet 3  . metoprolol succinate (TOPROL-XL) 50 MG 24 hr tablet TAKE 1 TABLET BY MOUTH ONCE DAILY (TAKE  WITH  OR  IMMEDIATELY  FOLLOWING  A  MEAL) 90 tablet 0  . ondansetron (ZOFRAN ODT) 4 MG disintegrating tablet Take 1 tablet (4 mg total) by mouth every 8 (eight) hours as needed for nausea or vomiting. 60 tablet 2  . ONETOUCH ULTRA test strip USE WITH METER TO CHECK  BLOOD SUGAR ONCE DAILY. 100 strip 3  . PARoxetine (PAXIL) 10 MG tablet TAKE 1 TABLET BY MOUTH ONCE DAILY IN THE MORNING 90 tablet 1   Facility-Administered Medications Prior to Visit  Medication Dose Route Frequency Provider Last Rate Last Admin  . betamethasone acetate-betamethasone sodium phosphate (CELESTONE) injection 3 mg  3 mg Intramuscular  Once Edrick Kins, DPM      . ipratropium-albuterol (DUONEB) 0.5-2.5 (3) MG/3ML nebulizer solution 3 mL  3 mL Nebulization Q6H Burnard Hawthorne, FNP        Review of Systems;  Patient denies headache, fevers, malaise, unintentional weight loss, skin rash, eye pain, sinus congestion and sinus pain, sore throat, dysphagia,  hemoptysis , cough, dyspnea, wheezing, chest pain, palpitations, orthopnea, edema, abdominal pain, nausea, melena, diarrhea, constipation, flank pain, dysuria, hematuria, urinary  Frequency, nocturia, numbness, tingling, seizures,  Focal weakness, Loss of consciousness,  Tremor, insomnia, depression, anxiety, and  suicidal ideation.      Objective:  BP (!) 162/92 (BP Location: Left Arm, Patient Position: Sitting, Cuff Size: Normal)   Pulse 74   Temp 98.3 F (36.8 C) (Temporal)   Resp 16   Ht 4' 10.5" (1.486 m)   Wt 173 lb (78.5 kg)   SpO2 98%   BMI 35.54 kg/m   BP Readings from Last 3 Encounters:  02/14/20 (!) 162/92  01/17/20 (!) 162/78  03/11/19 (!) 162/90    Wt Readings from Last 3 Encounters:  02/14/20 173 lb (78.5 kg)  01/17/20 172 lb 6.4 oz (78.2 kg)  01/17/20 160 lb (72.6 kg)    General appearance: alert, cooperative and appears stated age Ears: normal TM's and external ear canals both ears Throat: lips, mucosa, and tongue normal; teeth and gums normal Neck: no adenopathy, no carotid bruit, supple, symmetrical, trachea midline and thyroid not enlarged, symmetric, no tenderness/mass/nodules Back: symmetric, no curvature. ROM normal. No CVA tenderness. Lungs: clear to auscultation bilaterally Heart: regular rate and rhythm, S1, S2 normal, no murmur, click, rub or gallop Abdomen: soft, non-tender; bowel sounds normal; no masses,  no organomegaly Pulses: 2+ and symmetric Skin: Skin color, texture, turgor normal. No rashes or lesions Lymph nodes: Cervical, supraclavicular, and axillary nodes normal.  Lab Results  Component Value Date   HGBA1C 6.3 01/17/2020   HGBA1C 6.2 01/24/2019   HGBA1C 6.1 10/15/2018    Lab Results  Component Value Date   CREATININE 0.99 01/17/2020   CREATININE 0.87 01/24/2019   CREATININE 1.05 10/15/2018    Lab Results  Component Value Date   WBC 8.1 03/12/2018   HGB 13.5 03/12/2018   HCT 39.5 03/12/2018   PLT 240.0 03/12/2018   GLUCOSE 96 01/17/2020   CHOL 200 01/17/2020   TRIG 209.0 (H) 01/17/2020   HDL 48.60 01/17/2020   LDLDIRECT 95.0 01/17/2020   LDLCALC 115 (H) 01/24/2019   ALT 15 01/17/2020   AST 13 01/17/2020   NA 140 01/17/2020   K 3.6 01/17/2020   CL 103 01/17/2020   CREATININE 0.99 01/17/2020   BUN 15 01/17/2020   CO2  29 01/17/2020   TSH 0.88 11/29/2017   HGBA1C 6.3 01/17/2020   MICROALBUR 65.3 (H) 01/17/2020    MM CLIP PLACEMENT RIGHT  Result Date: 10/23/2019 CLINICAL DATA:  Post stereotactic guided biopsy of calcifications within the upper inner posterior right breast. EXAM: DIAGNOSTIC RIGHT MAMMOGRAM POST STEREOTACTIC BIOPSY COMPARISON:  Previous exam(s). FINDINGS: Mammographic images were obtained following stereotactic guided biopsy of calcifications within the upper inner posterior right breast. A coil shaped biopsy marking clip is present and located approximately 0.9 cm superior to the biopsied calcifications within the upper inner posterior right breast. IMPRESSION: Coil shaped biopsy marking clip located 0.9 cm superior to the biopsied calcifications within the upper inner posterior right breast. Final Assessment: Post Procedure Mammograms for Marker Placement Electronically Signed   By: Anderson Malta  Shelly Bombard M.D.   On: 10/23/2019 13:35   MM RT BREAST BX W LOC DEV 1ST LESION IMAGE BX SPEC STEREO GUIDE  Addendum Date: 10/29/2019   ADDENDUM REPORT: 10/28/2019 11:51 ADDENDUM: PATHOLOGY revealed: A. BREAST, RIGHT UPPER INNER QUADRANT, POSTERIOR; STEREOTACTIC CORE BIOPSY: - BENIGN MAMMARY PARENCHYMA WITH FIBROADENOMATOID CHANGES AND ASSOCIATED COARSE CALCIFICATIONS. - NEGATIVE FOR ATYPICAL PROLIFERATIVE BREAST DISEASE. Pathology results are CONCORDANT with imaging findings, per Dr. Everlean Alstrom. Multiple attempts to contact patient were unsuccessful. Notified patient's provider office, on 10/25/2019, requesting they contact patient with biopsy results. Recommendation: Return to annual bilateral screening mammogram (February 2022). Addendum by Electa Sniff RN on 10/28/2019. Electronically Signed   By: Everlean Alstrom M.D.   On: 10/28/2019 11:51   Result Date: 10/29/2019 CLINICAL DATA:  64 year old female with indeterminate calcifications in the upper inner posterior right breast. EXAM: RIGHT BREAST STEREOTACTIC CORE  NEEDLE BIOPSY COMPARISON:  Previous exams. FINDINGS: The patient and I discussed the procedure of stereotactic-guided biopsy including benefits and alternatives. We discussed the high likelihood of a successful procedure. We discussed the risks of the procedure including infection, bleeding, tissue injury, clip migration, and inadequate sampling. Informed written consent was given. The usual time out protocol was performed immediately prior to the procedure. Using sterile technique and 1% Lidocaine as local anesthetic, under stereotactic guidance, a 9 gauge vacuum assisted device was used to perform core needle biopsy of calcifications in the upper inner posterior right breast using a superior to inferior approach. Specimen radiograph was performed showing the presence of calcifications. Specimens with calcifications are identified for pathology. Lesion quadrant: Upper inner At the conclusion of the procedure, coil shaped tissue marker clip was deployed into the biopsy cavity. Follow-up 2-view mammogram was performed and dictated separately. IMPRESSION: Stereotactic-guided biopsy of calcifications in the upper inner posterior right breast. No apparent complications. Electronically Signed: By: Everlean Alstrom M.D. On: 10/23/2019 13:25    Assessment & Plan:   Problem List Items Addressed This Visit      Unprioritized   Subacute vestibular neuronitis    Chronic and episodic brought on by weather/barometric changes . She is quite debilitated when she has episodes.  Trial of daily betahistine.  patient is requesting a referral to  [facility] for management       Renovascular hypertension    Her  BP is not at goal currently,  Non Compliance confirmed.  She returned today for BP check and has allowed herself to run out of amlodipine several days ago.  Asked to change metoprolol dosing to nighttime and return in 2 weeks for BP check.  Lab Results  Component Value Date   CREATININE 0.99 01/17/2020    Lab  Results  Component Value Date   NA 140 01/17/2020   K 3.6 01/17/2020   CL 103 01/17/2020   CO2 29 01/17/2020         Obesity (BMI 30.0-34.9)    I have addressed  BMI and recommended a low glycemic index diet utilizing smaller more frequent meals to increase metabolism.  I have also recommended that patient start exercising with a goal of 30 minutes of aerobic exercise a minimum of 5 days per week.        Other Visit Diagnoses    Vertigo    -  Primary   Relevant Orders   Ambulatory referral to ENT      I provided  30 minutes of  face-to-face time during this encounter reviewing patient's current problems and past surgeries,  labs and imaging studies, providing counseling on the above mentioned problems , and coordination  of care . I am having Patricia Fields maintain her Vitamin D3, albuterol, aspirin EC, buPROPion, blood glucose meter kit and supplies, ondansetron, OneTouch Ultra, OneTouch Delica Plus [id number], atorvastatin, losartan, amLODipine, metoprolol succinate, PARoxetine, and Albuterol Sulfate. We will continue to administer ipratropium-albuterol and betamethasone acetate-betamethasone sodium phosphate.  No orders of the defined types were placed in this encounter.   There are no discontinued medications.  Follow-up: Return in about 1 week (around 02/21/2020).   Crecencio Mc, MD

## 2020-02-14 NOTE — Patient Instructions (Addendum)
Pt can call [facility] at [phone]    We need you to return for a blood pressure check when you have been taking ALL 3 of your blood pressure medications   [facility] for the vertigo  I will try to get you the betahistine

## 2020-02-16 ENCOUNTER — Encounter: Payer: Self-pay | Admitting: Internal Medicine

## 2020-02-16 NOTE — Assessment & Plan Note (Signed)
Chronic and episodic brought on by weather/barometric changes . She is quite debilitated when she has episodes.  Trial of daily betahistine.  patient is requesting a referral to  [facility] for management

## 2020-02-16 NOTE — Assessment & Plan Note (Signed)
I have addressed  BMI and recommended a low glycemic index diet utilizing smaller more frequent meals to increase metabolism.  I have also recommended that patient start exercising with a goal of 30 minutes of aerobic exercise a minimum of 5 days per week.  

## 2020-02-16 NOTE — Assessment & Plan Note (Signed)
Her  BP is not at goal currently,  Non Compliance confirmed.  She returned today for BP check and has allowed herself to run out of amlodipine several days ago.  Asked to change metoprolol dosing to nighttime and return in 2 weeks for BP check.  Lab Results  Component Value Date   CREATININE 0.99 01/17/2020    Lab Results  Component Value Date   NA 140 01/17/2020   K 3.6 01/17/2020   CL 103 01/17/2020   CO2 29 01/17/2020

## 2020-02-26 ENCOUNTER — Telehealth: Payer: Self-pay | Admitting: Internal Medicine

## 2020-02-26 NOTE — Telephone Encounter (Signed)
My Chart message sent

## 2020-03-04 IMAGING — US ULTRASOUND ABDOMEN LIMITED
1 series · 14 of 25 positions shown · non-contrast
Comparison: None.

CLINICAL DATA: Elevated alkaline phosphatase measurement.

EXAM:
ULTRASOUND ABDOMEN LIMITED RIGHT UPPER QUADRANT

[Series 1: ultrasound abdomen limited · 0.20mm/px · 14 of 64 slices shown]
[im 1/64]
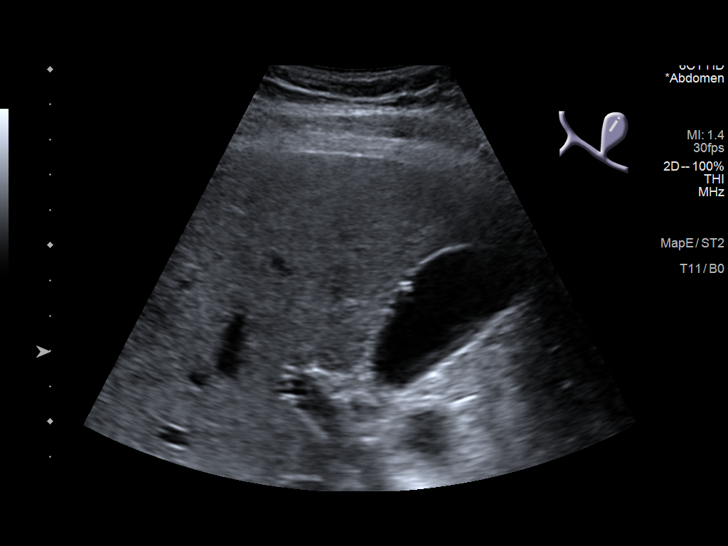
[im 6/64]
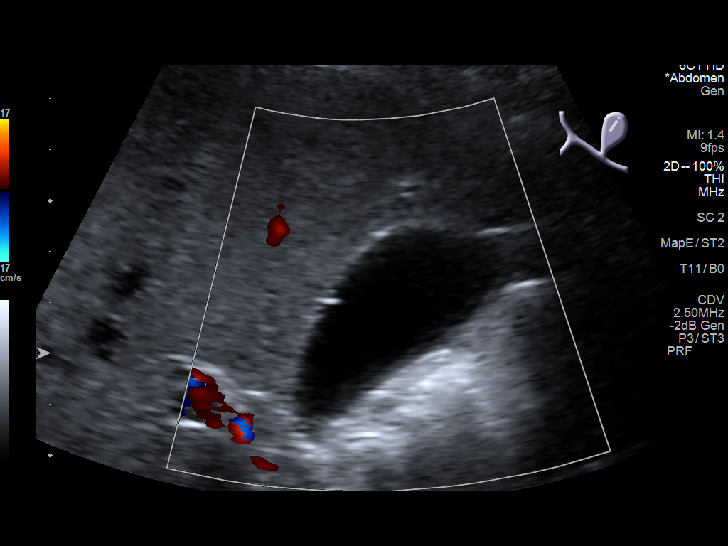
[im 11/64]
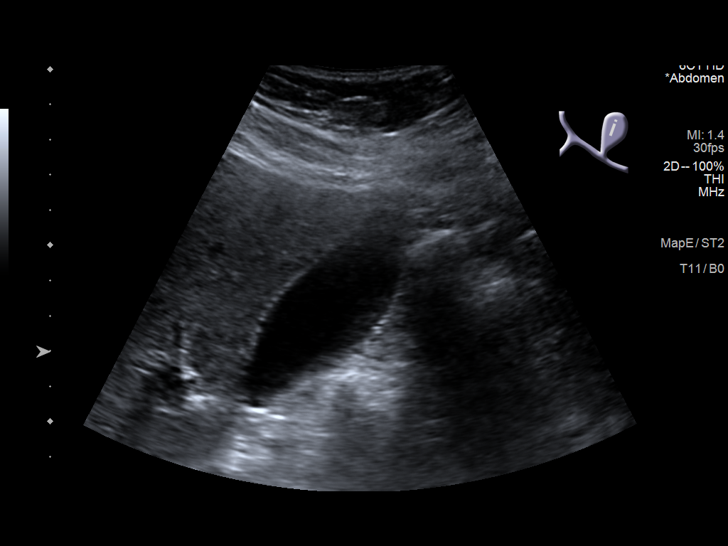
[im 16/64]
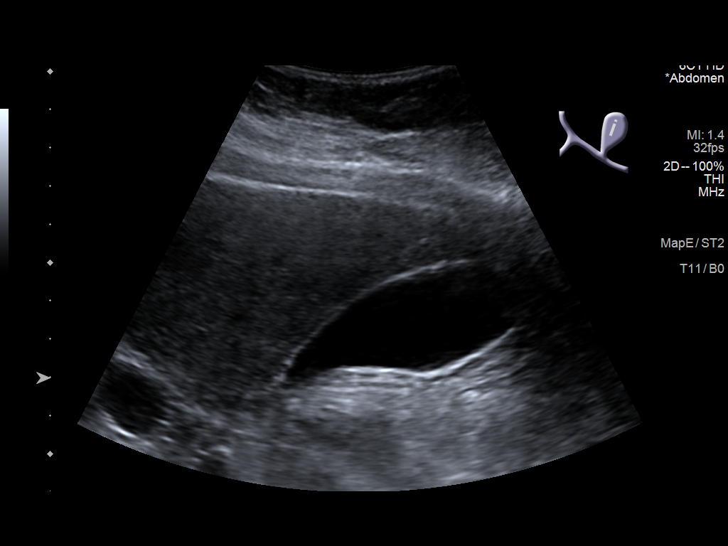
[im 22/64]
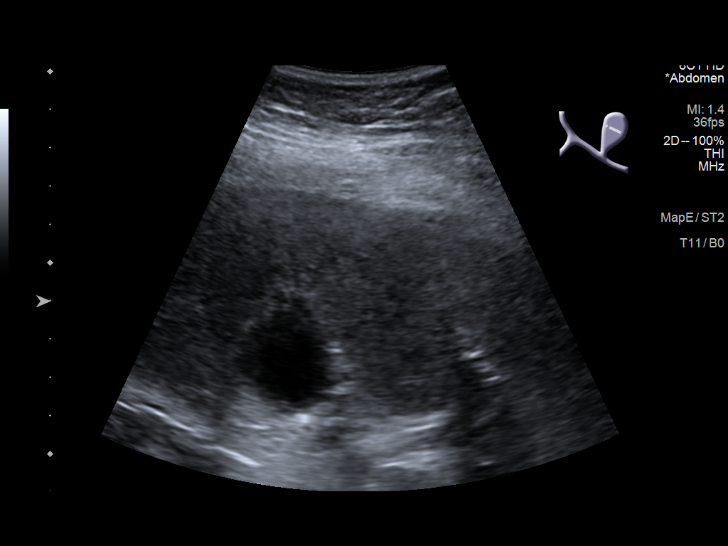
[im 24/64]
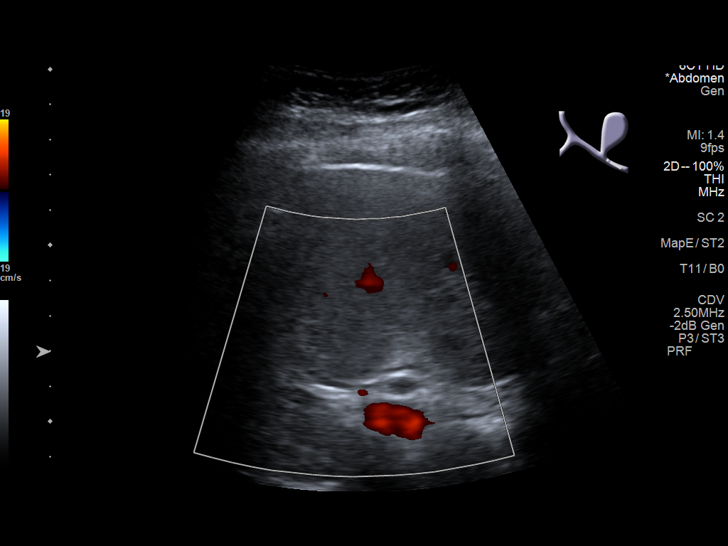
[im 29/64]
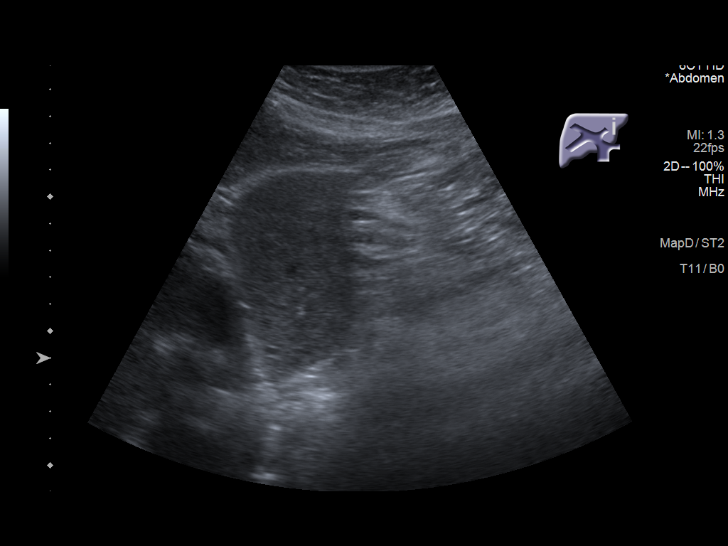
[im 35/64]
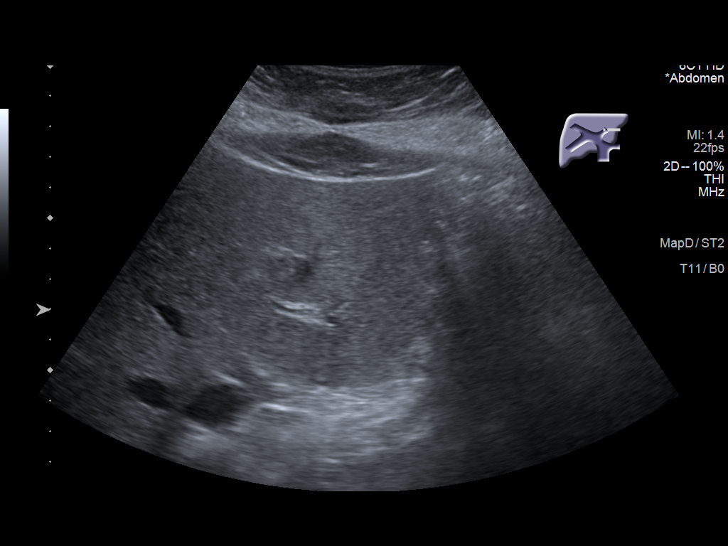
[im 40/64]
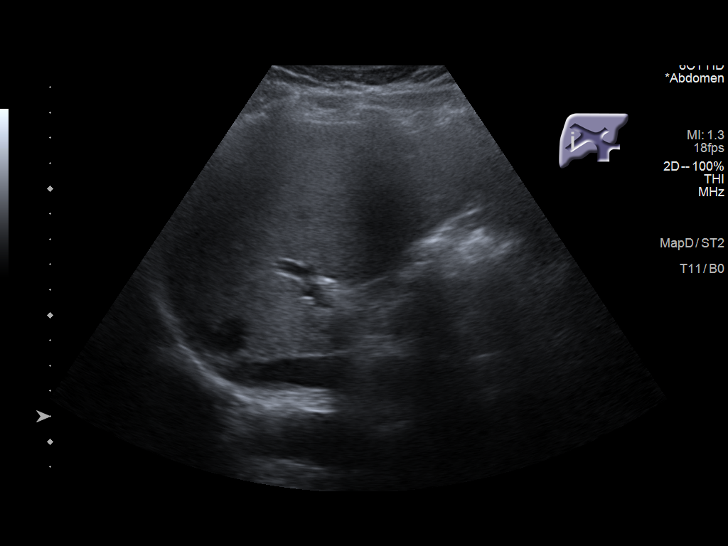
[im 43/64]
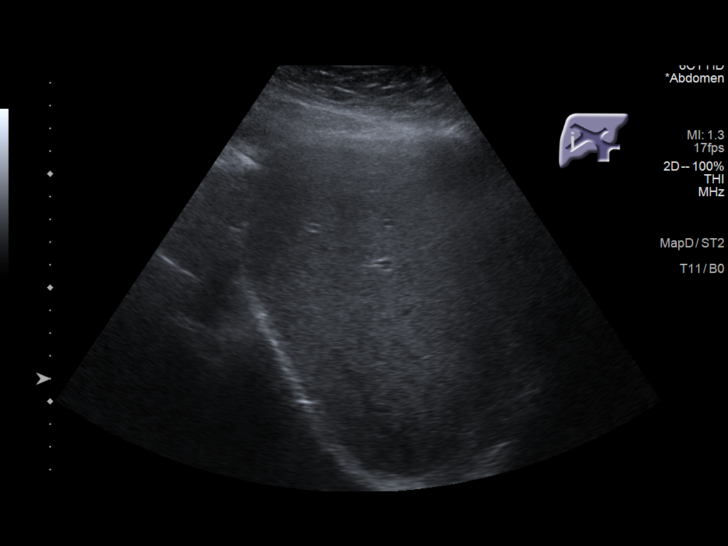
[im 48/64]
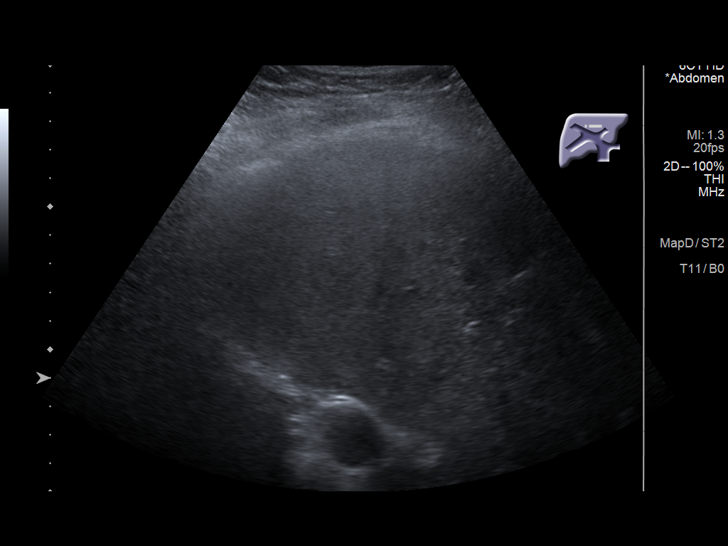
[im 53/64]
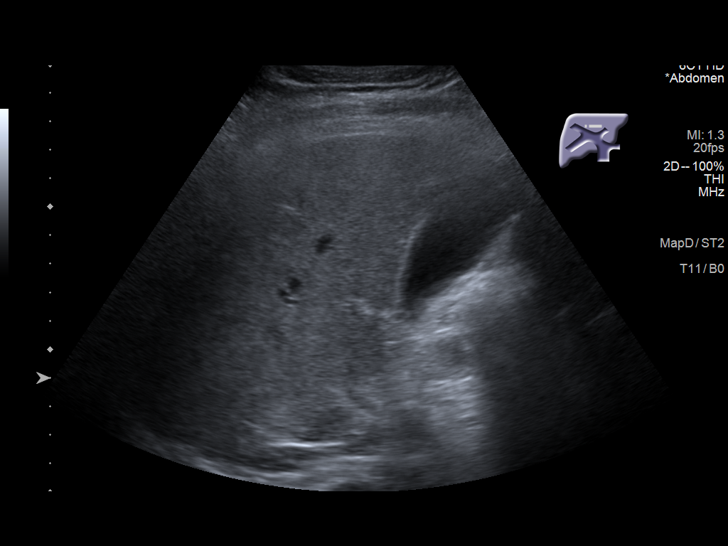
[im 58/64]
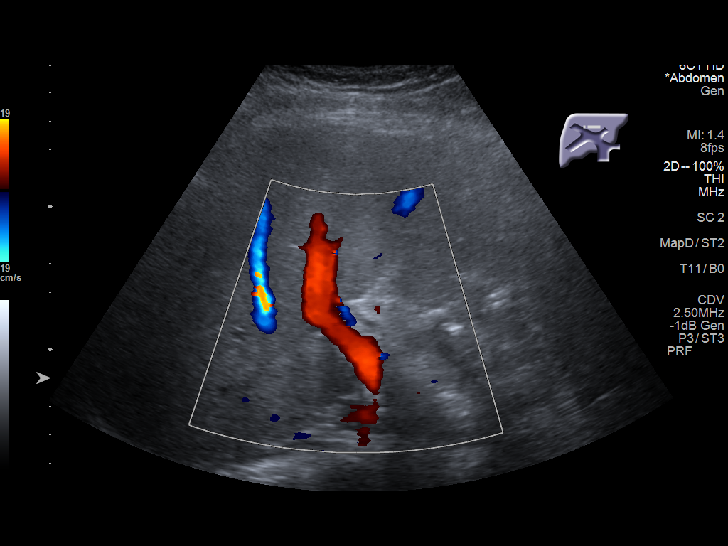
[im 64/64]
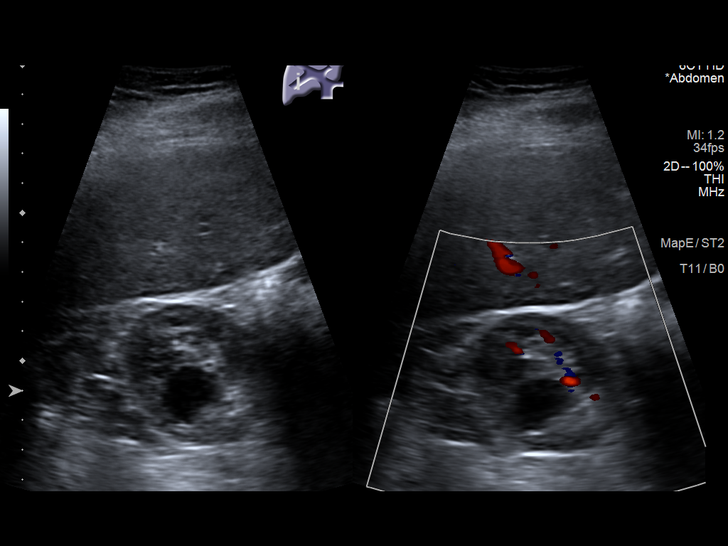

[14 of 25 positions shown; findings below may reference images not displayed]

FINDINGS: Gallbladder:

No gallstones or wall thickening visualized. No sonographic Murphy
sign noted by sonographer. Probable 4 mm polyp is noted.

Common bile duct:

Diameter: 3 mm which is within normal limits.

Liver:

No focal lesion identified. Within normal limits in parenchymal
echogenicity. Portal vein is patent on color Doppler imaging with
normal direction of blood flow towards the liver.
IMPRESSION: No significant abnormality seen in the right upper quadrant of the
abdomen.

## 2020-03-12 ENCOUNTER — Other Ambulatory Visit: Payer: Self-pay | Admitting: Internal Medicine

## 2020-03-12 DIAGNOSIS — I10 Essential (primary) hypertension: Secondary | ICD-10-CM

## 2020-03-16 ENCOUNTER — Telehealth: Payer: Self-pay | Admitting: Internal Medicine

## 2020-03-16 NOTE — Telephone Encounter (Signed)
Pt would like for Dr. Darrick Huntsman to call her in an antibiotic and cough medicine for her bronchitis. She said she has had this since she was a child. I let her know that she would normally need an appointment and she said that Dr. Darrick Huntsman normally calls this in for her. Do she need to be triaged first?

## 2020-03-16 NOTE — Telephone Encounter (Signed)
Patient was informed. She was very upset.She stated she will figure something else out.

## 2020-03-16 NOTE — Telephone Encounter (Signed)
In this period of COVID PANDEMIC I am not calling in antibiotics for anyone with a productive cough, regardless of their "history of bronchitis since childhood."  PATIENT needs to be tested for COVID 19  And schedule a virtual appt AFTER she has been tested

## 2020-03-17 NOTE — Telephone Encounter (Signed)
Pt called in today and wanted me to schedule her a blood pressure nurse visit. I let her know that because she has a productive cough and need antibiotics I could not schedule her until at least 14-21 days. She said she didn't understand why we are not trying to treat her and to call her back when she can be cared for. Then patient hung up.

## 2020-03-17 NOTE — Telephone Encounter (Signed)
pLEASE CALL HER BACK AND SCHEDULE A VIRTUAL VISIT .  IT CAN BE TOMORROW AT [facility] IF NEED BE

## 2020-03-17 NOTE — Telephone Encounter (Signed)
Attempted to call pt. Mail box is full.  

## 2020-03-18 ENCOUNTER — Telehealth (INDEPENDENT_AMBULATORY_CARE_PROVIDER_SITE_OTHER): Payer: Medicare HMO | Admitting: Internal Medicine

## 2020-03-18 ENCOUNTER — Encounter: Payer: Self-pay | Admitting: Internal Medicine

## 2020-03-18 ENCOUNTER — Telehealth: Payer: Self-pay | Admitting: Internal Medicine

## 2020-03-18 ENCOUNTER — Other Ambulatory Visit: Payer: Self-pay

## 2020-03-18 DIAGNOSIS — J441 Chronic obstructive pulmonary disease with (acute) exacerbation: Secondary | ICD-10-CM | POA: Diagnosis not present

## 2020-03-18 MED ORDER — PREDNISONE 10 MG PO TABS
ORAL_TABLET | ORAL | 0 refills | Status: DC
Start: 2020-03-18 — End: 2020-10-16

## 2020-03-18 MED ORDER — BENZONATATE 200 MG PO CAPS
200.0000 mg | ORAL_CAPSULE | Freq: Three times a day (TID) | ORAL | 1 refills | Status: DC | PRN
Start: 2020-03-18 — End: 2020-11-19

## 2020-03-18 MED ORDER — CHERATUSSIN AC 100-10 MG/5ML PO SOLN: 5.0000 mL | Freq: Three times a day (TID) | ORAL | 0 refills | Status: DC | PRN

## 2020-03-18 MED ORDER — DOXYCYCLINE HYCLATE 100 MG PO TABS
100.0000 mg | ORAL_TABLET | Freq: Two times a day (BID) | ORAL | 0 refills | Status: DC
Start: 2020-03-18 — End: 2020-04-28

## 2020-03-18 NOTE — Telephone Encounter (Signed)
Pt states that benzonatate (TESSALON) 200 MG capsule is no longer covered by insurance and she needs a dif cough medicine

## 2020-03-18 NOTE — Assessment & Plan Note (Signed)
Strongly advised to get tested for COVID given length of illness ( Day 6 , no better).[name] empiric doxycycline prednisone and cough suppression

## 2020-03-18 NOTE — Telephone Encounter (Signed)
cheratussin with codeine sent in to [facility]

## 2020-03-18 NOTE — Telephone Encounter (Signed)
???   NO message attached to your last reply

## 2020-03-18 NOTE — Progress Notes (Signed)
Telephone  Note  This visit type was conducted due to national recommendations for restrictions regarding the COVID-19 pandemic (e.g. social distancing).  This format is felt to be most appropriate for this patient at this time.  All issues noted in this document were discussed and addressed.  No physical exam was performed (except for noted visual exam findings with Video Visits).   I connected with@ on 03/18/20 at  1:15 PM EDT by  telephone and verified that I am speaking with the correct person using two identifiers. Location patient: home Location provider: work or home office Persons participating in the virtual visit: patient, provider  I discussed the limitations, risks, security and privacy concerns of performing an evaluation and management service by telephone and the availability of in person appointments. I also discussed with the patient that there may be a patient responsible charge related to this service. The patient expressed understanding and agreed to proceed.  Reason for visit: "bronchitis"  HPI:  64 yr old female chronic tobacco abuse with COPD .  Type 2 DM, and hypertension presents with 6 days history of cough productive of yellow sputum.  Started with Sore throat , runny nose and wheezing.  Had fever on day 1 .  Using nebulizer with albuterol every 6 hours,   Honey for the cough .  No sinus pain , slight ear ache  Family members sick.  Great granddaughter  Was the first family member to develop  symptoms and is now reportedly improving .    Patient has received both doses of the available COVID 19 vaccine without complications and has deferred getting tested for current illness.   She remains opposed to testing .  Patient states that she has not left the home in 2 weeks,  But lives with grandson and his family.       ROS: See pertinent positives and negatives per HPI.  [name]  Past Medical History:  Diagnosis Date  . 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ervical  region   . Chronic kidney disease    renal stent  . COPD (chronic obstructive pulmonary disease) ([place])   . History of nephrolithiasis   . HOH (hard of hearing)   . Hyperlipidemia   . Hypertension   . Interstitial cystitis   . Meniere disease   . Peripheral arterial disease [facility]) Feb 2011   by renal artery duplex  . Tobacco abuse    one pack daily    Past Surgical History:  Procedure Laterality Date  . ABDOMINAL HYSTERECTOMY    . arthroscopy     left knee , remote  . BREAST BIOPSY Right 10/23/2019   stereo biopsy calcs/coil clip/ path pending  . BREAST EXCISIONAL BIOPSY Left 1980's   NEG  . BREAST SURGERY     left breast cyst, benign  . CARPAL TUNNEL RELEASE     Dr. Sabra Heck,  right hand   . CATARACT EXTRACTION W/PHACO Right 03/05/2015   Procedure: CATARACT EXTRACTION PHACO AND INTRAOCULAR LENS PLACEMENT (IOC);  Surgeon: Lyla Glassing, MD;  Location: [facility] ORS;  Service: Ophthalmology;  Laterality: Right;  [place]: 00:52.3 AP%: 15.2 CDE: 7.96  . KNEE ARTHROSCOPY Left   . TOTAL ABDOMINAL HYSTERECTOMY W/ BILATERAL SALPINGOOPHORECTOMY  1992   due to uterine tumor, and endometriosis     Family History  Problem Relation Age of Onset  . Coronary artery disease Mother   . COPD Mother   . Heart disease Mother   . Breast cancer Maternal Grandmother   .  Multiple sclerosis Sister   . Stroke Sister     SOCIAL HX:  reports that she has been smoking cigarettes. She has been smoking about 0.50 packs per day. She has never used smokeless tobacco. She reports that she does not drink alcohol and does not use drugs.   Current Outpatient Medications:  .  albuterol (PROVENTIL) (2.5 MG/3ML) 0.083% nebulizer solution, Take 3 mLs (2.5 mg total) by nebulization every 8 (eight) hours as needed for wheezing or shortness of breath., Disp: 150 mL, Rfl: 1 .  amLODipine (NORVASC) 10 MG tablet, Take 1 tablet (10 mg total) by mouth daily., Disp: 90 tablet, Rfl: 1 .  aspirin EC 81 MG tablet, Take 1  tablet (81 mg total) by mouth daily., Disp: 90 tablet, Rfl: 2 .  atorvastatin (LIPITOR) 40 MG tablet, Take 1 tablet by mouth once daily, Disp: 90 tablet, Rfl: 3 .  blood glucose meter kit and supplies, Use to check blood sugars once daily.  (FOR ICD-10 E10.9, E11.9)., Disp: 1 each, Rfl: 0 .  buPROPion (WELLBUTRIN SR) 150 MG 12 hr tablet, Take 1 tablet (150 mg total) by mouth 2 (two) times daily., Disp: 180 tablet, Rfl: 1 .  Cholecalciferol (VITAMIN D3) 1000 UNITS CAPS, Take by mouth.  , Disp: , Rfl:  .  Lancets (ONETOUCH DELICA PLUS [id number]) MISC, USE WITH DEVICE TO TEST  ONCE DAILY, Disp: 100 each, Rfl: 3 .  losartan (COZAAR) 100 MG tablet, TAKE 1 TABLET BY MOUTH  DAILY, Disp: 90 tablet, Rfl: 3 .  metoprolol succinate (TOPROL-XL) 50 MG 24 hr tablet, TAKE 1 TABLET BY MOUTH ONCE DAILY (TAKE  WITH  OR  IMMEDIATELY  FOLLOWING  A  MEAL), Disp: 90 tablet, Rfl: 0 .  ondansetron (ZOFRAN ODT) 4 MG disintegrating tablet, Take 1 tablet (4 mg total) by mouth every 8 (eight) hours as needed for nausea or vomiting., Disp: 60 tablet, Rfl: 2 .  ONETOUCH ULTRA test strip, USE WITH METER TO CHECK  BLOOD SUGAR ONCE DAILY., Disp: 100 strip, Rfl: 3 .  PARoxetine (PAXIL) 10 MG tablet, TAKE 1 TABLET BY MOUTH ONCE DAILY IN THE MORNING, Disp: 90 tablet, Rfl: 1 .  Albuterol Sulfate (PROAIR RESPICLICK) 569 (90 Base) MCG/ACT AEPB, Inhale 1 puff into the lungs every 8 (eight) hours as needed. For wheezing (Patient not taking: Reported on 03/18/2020), Disp: 3 each, Rfl: 0 .  benzonatate (TESSALON) 200 MG capsule, Take 1 capsule (200 mg total) by mouth 3 (three) times daily as needed for cough., Disp: 60 capsule, Rfl: 1 .  doxycycline (VIBRA-TABS) 100 MG tablet, Take 1 tablet (100 mg total) by mouth 2 (two) times daily., Disp: 20 tablet, Rfl: 0 .  predniSONE (DELTASONE) 10 MG tablet, 6 tablets daily for 3 days, then reduce by 1 tablet daily until gone, Disp: 33 tablet, Rfl: 0  Current Facility-Administered Medications:  .   betamethasone acetate-betamethasone sodium phosphate (CELESTONE) injection 3 mg, 3 mg, Intramuscular, Once, Evans, Brent M, DPM .  ipratropium-albuterol (DUONEB) 0.5-2.5 (3) MG/3ML nebulizer solution 3 mL, 3 mL, Nebulization, Q6H, Arnett, Yvetta Coder, FNP  EXAM:   General impression: alert, cooperative and articulate.  Short of breath with long sentences.  paroxysmal cough   Lungs: speech is choppy due to cough; patient is mildly  short of breath and punctuated by cough, .   Psych: affect normal.  speech is articulate and non pressured .  Denies suicidal thoughts   ASSESSMENT AND PLAN:  Discussed the following assessment and plan:  COPD  exacerbation ([place])  COPD exacerbation ([place]) Strongly advised to get tested for COVID given length of illness ( Day 6 , no better).[name] empiric doxycycline prednisone and cough suppression    I discussed the assessment and treatment plan with the patient. The patient was provided an opportunity to ask questions and all were answered. The patient agreed with the plan and demonstrated an understanding of the instructions.   The patient was advised to call back or seek an in-person evaluation if the symptoms worsen or if the condition fails to improve as anticipated.  I provided  20 minutes of non-face-to-face time during this encounter.   Crecencio Mc, MD

## 2020-03-18 NOTE — Telephone Encounter (Signed)
Pt has been scheduled for a telephone visit at 1:15 today. Patricia Fields has made pt aware.

## 2020-03-19 NOTE — Telephone Encounter (Signed)
Patient was informed.

## 2020-04-08 DIAGNOSIS — M25561 Pain in right knee: Secondary | ICD-10-CM | POA: Diagnosis not present

## 2020-04-08 DIAGNOSIS — M25461 Effusion, right knee: Secondary | ICD-10-CM | POA: Diagnosis not present

## 2020-04-13 DIAGNOSIS — R42 Dizziness and giddiness: Secondary | ICD-10-CM | POA: Diagnosis not present

## 2020-04-13 DIAGNOSIS — H918X1 Other specified hearing loss, right ear: Secondary | ICD-10-CM | POA: Diagnosis not present

## 2020-04-13 DIAGNOSIS — H918X2 Other specified hearing loss, left ear: Secondary | ICD-10-CM | POA: Diagnosis not present

## 2020-04-14 ENCOUNTER — Telehealth: Payer: Self-pay

## 2020-04-14 NOTE — Telephone Encounter (Signed)
LMTCB with Dr. Bevelyn Ngo.

## 2020-04-14 NOTE — Telephone Encounter (Signed)
Does the dyazide need to take place of one of the other bp meds? If so which one?

## 2020-04-14 NOTE — Telephone Encounter (Signed)
Yes, she can ADD dyazide . She has normal kidney function and should be able to tolerate hctz.

## 2020-04-14 NOTE — Telephone Encounter (Signed)
ENT physician at [facility] is treating pt for meiners disease and would like to know if she could switch one of pt's bp meds to dyazide. Physician would like a call back to discuss.   Patricia Chiquito, MD cell [phone]

## 2020-04-14 NOTE — Telephone Encounter (Signed)
NO,  IT SHOULD BE IN ADDITION TO.

## 2020-04-15 NOTE — Telephone Encounter (Signed)
Spoke with Dr. Bevelyn Ngo and let her know that Dr. Darrick Huntsman is okay with adding the dyazide to the pt's current bp medication regimen. Dr. Bevelyn Ngo gave a verbal understanding.

## 2020-04-17 ENCOUNTER — Other Ambulatory Visit: Payer: Self-pay

## 2020-04-17 MED ORDER — AMLODIPINE BESYLATE 10 MG PO TABS: 10.0000 mg | ORAL_TABLET | Freq: Every day | ORAL | 1 refills | Status: DC

## 2020-04-17 MED ORDER — PAROXETINE HCL 10 MG PO TABS: ORAL_TABLET | ORAL | 1 refills | Status: DC

## 2020-04-28 ENCOUNTER — Other Ambulatory Visit: Payer: Self-pay | Admitting: Internal Medicine

## 2020-04-28 ENCOUNTER — Telehealth: Payer: Self-pay | Admitting: Internal Medicine

## 2020-04-28 DIAGNOSIS — I150 Renovascular hypertension: Secondary | ICD-10-CM

## 2020-04-28 DIAGNOSIS — H8109 Meniere's disease, unspecified ear: Secondary | ICD-10-CM | POA: Insufficient documentation

## 2020-04-28 MED ORDER — LOSARTAN POTASSIUM-HCTZ 100-25 MG PO TABS
1.0000 | ORAL_TABLET | Freq: Every day | ORAL | 3 refills | Status: DC
Start: 2020-04-28 — End: 2020-10-18

## 2020-04-28 NOTE — Assessment & Plan Note (Signed)
Adding hctz per [facility]

## 2020-04-28 NOTE — Progress Notes (Unsigned)
BP medication change due to add hctz per [facility]NT'  Losartan 100 mg >>> losartan/hct 100/25  .  Take once daily in AM  Needs BMET and BP check one week after med change.

## 2020-04-28 NOTE — Telephone Encounter (Signed)
I am making a BP medication change due to request to  add hctz per [facility]'  Losartan 100 mg >>> losartan/hct 100/25  . Discard old losartan and take new med  Take once daily in AM  She Needs BMET and BP check one week after med change.

## 2020-04-28 NOTE — Telephone Encounter (Signed)
Attempted to call. No answer no voicemail. 

## 2020-04-28 NOTE — Assessment & Plan Note (Signed)
Adding hctz .  Losartan /hct called to pharmacy.

## 2020-04-29 NOTE — Telephone Encounter (Signed)
Patient was returning call about B/P medication

## 2020-04-29 NOTE — Telephone Encounter (Signed)
Pt returned call and said she has a dr appt at 10:30 but if you call and don't get her she will return your call.

## 2020-04-30 NOTE — Telephone Encounter (Signed)
Spoke with pt and informed her of the blood pressure medication change. Pt gave a verbal understanding and stated that she would call back tomorrow to schedule the appt or when she can get the medication picked up.

## 2020-05-07 DIAGNOSIS — M1712 Unilateral primary osteoarthritis, left knee: Secondary | ICD-10-CM | POA: Diagnosis not present

## 2020-05-07 DIAGNOSIS — M25561 Pain in right knee: Secondary | ICD-10-CM | POA: Diagnosis not present

## 2020-05-11 DIAGNOSIS — M1712 Unilateral primary osteoarthritis, left knee: Secondary | ICD-10-CM | POA: Diagnosis not present

## 2020-08-25 ENCOUNTER — Telehealth: Payer: Self-pay

## 2020-08-25 NOTE — Telephone Encounter (Signed)
Pt wanted to let you know that she has not heard from ear, nose, and throat Dr. She also wants to let you know that she changed pharmacies to [facility] on [address] in [place]. She would like a call back

## 2020-08-25 NOTE — Telephone Encounter (Signed)
Pt called and stated that she has not heard from the referral that was placed for ENT. Can  You check on this?

## 2020-08-25 NOTE — Telephone Encounter (Signed)
Good afternoon!  ENT referral from 01/2020? Please advise and Thanks  It looks as if that referral was closed pt had appt in August.

## 2020-08-26 NOTE — Telephone Encounter (Signed)
It is the only referral I saw that saw for ENT so I will call the pt and let her know that the referral has been closed. Can you tell if they tried to reach out to her to schedule an appt or if she no showed for an appt?

## 2020-08-27 NOTE — Telephone Encounter (Signed)
Pt called to follow up on message below  Please call [phone]

## 2020-08-27 NOTE — Telephone Encounter (Signed)
No answer, no voicemail.

## 2020-08-28 NOTE — Telephone Encounter (Signed)
Tried to call patient no voicemail

## 2020-08-28 NOTE — Telephone Encounter (Signed)
Pt said she didn't receive a call yesterday. I let her know we tried reaching but no one answered and she didn't have voicemail set up. She is asking for a call back after 2pm today.

## 2020-08-28 NOTE — Telephone Encounter (Signed)
Good afternoon!  It show that pt had appt on 04/13/2020 according to my note. I will call to follow up.

## 2020-08-31 ENCOUNTER — Telehealth: Payer: Self-pay | Admitting: Internal Medicine

## 2020-08-31 NOTE — Telephone Encounter (Signed)
Spoke with pt and she needed the information of the ENT office that she went to and the phone number. She stated that they were supposed to schedule her for "some kind of scan and never called to schedule it". Gave pt the information.

## 2020-08-31 NOTE — Telephone Encounter (Signed)
See previous message

## 2020-08-31 NOTE — Telephone Encounter (Signed)
Patient is requesting a phone call back from Dr. Melina Schools nurse. She needs to discuss her medications. She states that there is one medication that she is taking that she has know idea who the doctor is. Please call patient on [phone]

## 2020-08-31 NOTE — Telephone Encounter (Signed)
Yes pt had appt on 04/13/2020 at [facility] for vertigo. That referral has been fulfilled. Please advise and Thank you!

## 2020-09-25 ENCOUNTER — Ambulatory Visit: Payer: Medicare HMO | Admitting: Internal Medicine

## 2020-10-16 ENCOUNTER — Ambulatory Visit (INDEPENDENT_AMBULATORY_CARE_PROVIDER_SITE_OTHER): Payer: Medicare HMO | Admitting: Internal Medicine

## 2020-10-16 ENCOUNTER — Other Ambulatory Visit: Payer: Self-pay

## 2020-10-16 ENCOUNTER — Encounter: Payer: Self-pay | Admitting: Internal Medicine

## 2020-10-16 VITALS — BP 180/92 | HR 87 | Temp 97.8°F | Ht 58.5 in | Wt 155.0 lb

## 2020-10-16 DIAGNOSIS — E669 Obesity, unspecified: Secondary | ICD-10-CM

## 2020-10-16 DIAGNOSIS — H8120 Vestibular neuronitis, unspecified ear: Secondary | ICD-10-CM | POA: Diagnosis not present

## 2020-10-16 DIAGNOSIS — Z23 Encounter for immunization: Secondary | ICD-10-CM

## 2020-10-16 DIAGNOSIS — E782 Mixed hyperlipidemia: Secondary | ICD-10-CM

## 2020-10-16 DIAGNOSIS — Z720 Tobacco use: Secondary | ICD-10-CM

## 2020-10-16 DIAGNOSIS — Z1231 Encounter for screening mammogram for malignant neoplasm of breast: Secondary | ICD-10-CM

## 2020-10-16 DIAGNOSIS — E1151 Type 2 diabetes mellitus with diabetic peripheral angiopathy without gangrene: Secondary | ICD-10-CM

## 2020-10-16 DIAGNOSIS — I150 Renovascular hypertension: Secondary | ICD-10-CM

## 2020-10-16 DIAGNOSIS — I10 Essential (primary) hypertension: Secondary | ICD-10-CM

## 2020-10-16 DIAGNOSIS — Z716 Tobacco abuse counseling: Secondary | ICD-10-CM

## 2020-10-16 LAB — MICROALBUMIN / CREATININE URINE RATIO
Creatinine,U: 123.5 mg/dL
Microalb Creat Ratio: 15.8 mg/g (ref 0.0–30.0)
Microalb, Ur: 19.5 mg/dL — ABNORMAL HIGH (ref 0.0–1.9)

## 2020-10-16 LAB — HEMOGLOBIN A1C: Hgb A1c MFr Bld: 6.1 % (ref 4.6–6.5)

## 2020-10-16 LAB — LIPID PANEL
Cholesterol: 213 mg/dL — ABNORMAL HIGH (ref 0–200)
HDL: 42.7 mg/dL (ref >39.00)
LDL Cholesterol: 139 mg/dL — ABNORMAL HIGH (ref 0–99)
NonHDL: 169.8
Total CHOL/HDL Ratio: 5
Triglycerides: 153 mg/dL — ABNORMAL HIGH (ref 0.0–149.0)
VLDL: 30.6 mg/dL (ref 0.0–40.0)

## 2020-10-16 LAB — COMPREHENSIVE METABOLIC PANEL
ALT: 11 U/L (ref 0–35)
AST: 10 U/L (ref 0–37)
Albumin: 4 g/dL (ref 3.5–5.2)
Alkaline Phosphatase: 96 U/L (ref 39–117)
BUN: 17 mg/dL (ref 6–23)
CO2: 27 mEq/L (ref 19–32)
Calcium: 9.9 mg/dL (ref 8.4–10.5)
Chloride: 103 mEq/L (ref 96–112)
Creatinine, Ser: 0.9 mg/dL (ref 0.40–1.20)
GFR: 67.07 mL/min (ref >60.00)
Glucose, Bld: 105 mg/dL — ABNORMAL HIGH (ref 70–99)
Potassium: 3.5 mEq/L (ref 3.5–5.1)
Sodium: 141 mEq/L (ref 135–145)
Total Bilirubin: 0.4 mg/dL (ref 0.2–1.2)
Total Protein: 6.8 g/dL (ref 6.0–8.3)

## 2020-10-16 MED ORDER — METOPROLOL SUCCINATE ER 50 MG PO TB24: ORAL_TABLET | ORAL | 0 refills | Status: DC

## 2020-10-16 NOTE — Progress Notes (Signed)
Subjective:  Patient ID: Patricia Fields, female    DOB: [birth year]  Age: 65 y.o. MRN: [id number]  CC: The primary encounter diagnosis was Need for immunization against influenza. Diagnoses of Breast cancer screening by mammogram, Essential hypertension, Mixed hyperlipidemia, DM (diabetes mellitus), type 2 with peripheral vascular complications ([place]), Tobacco abuse, Subacute vestibular neuronitis, unspecified laterality, Renovascular hypertension, Tobacco abuse counseling, and Obesity (BMI 30.0-34.9) were also pertinent to this visit.  HPI COURTENEY ALDERETE presents for follow up on type 2 DM, COPD and hypertension and obesity  This visit occurred during the SARS-CoV-2 public health emergency.  Safety protocols were in place, including screening questions prior to the visit, additional usage of staff PPE, and extensive cleaning of exam room while observing appropriate contact time as indicated for disinfecting solutions.   1)  HTN:  She states that she ran out of her metoprolol  4 days  Ago but has been taking amlodipine and losartan hctz , with most recent doses this morning over an hour ago   2) Obesity: has lost 18 lbs by giving up " chips and all junk food."  She has not been able to exercise,  Even walking due to right hip pain   3)  Type 2 DM:  Not checking blood sugars .  Last dilated retinal eye exam 2020 ,  The cost was $40 in 2021 for the dilated retinal exam which has been problematic  Sh no longer drives at night and does very little driving because of her difficulty walking   4) She reports frequent foot cramps occurring at night   5) PAD: Has not seen vascular this year  Needs renal doppler . [facility]  . Pulses weak on exam today .  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  Outpatient Medications Prior to Visit  Medication Sig Dispense Refill  . albuterol  (PROVENTIL) (2.5 MG/3ML) 0.083% nebulizer solution Take 3 mLs (2.5 mg total) by nebulization every 8 (eight) hours as needed for wheezing or shortness of breath. 150 mL 1  . Albuterol Sulfate (PROAIR RESPICLICK) 585 (90 Base) MCG/ACT AEPB Inhale 1 puff into the lungs every 8 (eight) hours as needed. For wheezing 3 each 0  . amLODipine (NORVASC) 10 MG tablet Take 1 tablet (10 mg total) by mouth daily. 90 tablet 1  . aspirin EC 81 MG tablet Take 1 tablet (81 mg total) by mouth daily. 90 tablet 2  . benzonatate (TESSALON) 200 MG capsule Take 1 capsule (200 mg total) by mouth 3 (three) times daily as needed for cough. 60 capsule 1  . Blood Glucose Calibration (OT ULTRA/FASTTK CNTRL SOLN) SOLN See admin instructions.    . blood glucose meter kit and supplies Use to check blood sugars once daily.  (FOR ICD-10 E10.9, E11.9). 1 each 0  . buPROPion (WELLBUTRIN SR) 150 MG 12 hr tablet Take 1 tablet (150 mg total) by mouth 2 (two) times daily. 180 tablet 1  . Cholecalciferol (VITAMIN D3) 1000 UNITS CAPS Take by mouth.    [name] guaiFENesin-codeine (CHERATUSSIN AC) 100-10 MG/5ML syrup Take 5 mLs by mouth 3 (three) times daily as needed for cough. 120 mL 0  . Lancet Devices (EASY MINI EJECT LANCING DEVICE) MISC daily.    . Lancets (ONETOUCH DELICA PLUS [id number]) MISC USE WITH DEVICE TO TEST  ONCE DAILY 100 each 3  . ondansetron (ZOFRAN ODT) 4 MG disintegrating tablet Take 1 tablet (4 mg total) by mouth every 8 (eight) hours as needed for nausea or vomiting. 60 tablet 2  . ONETOUCH ULTRA test strip USE WITH METER TO CHECK  BLOOD SUGAR ONCE DAILY. 100 strip 3  . PARoxetine (PAXIL) 10 MG tablet TAKE 1 TABLET BY MOUTH ONCE DAILY IN THE MORNING 90 tablet 1  . atorvastatin (LIPITOR) 40 MG tablet Take 1 tablet by mouth once daily 90 tablet 3  . losartan-hydrochlorothiazide (HYZAAR) 100-25 MG tablet Take 1 tablet by mouth daily. 90 tablet 3  . metoprolol succinate (TOPROL-XL) 50 MG 24 hr tablet TAKE 1 TABLET BY MOUTH ONCE  DAILY (TAKE  WITH  OR  IMMEDIATELY  FOLLOWING  A  MEAL) 90 tablet 0  . predniSONE (DELTASONE) 10 MG tablet 6 tablets daily for 3 days, then reduce by 1 tablet daily until gone 33 tablet 0   Facility-Administered Medications Prior to Visit  Medication Dose Route Frequency Provider Last Rate Last Admin  . betamethasone acetate-betamethasone sodium phosphate (CELESTONE) injection 3 mg  3 mg Intramuscular Once Evans, Brent M, DPM      . ipratropium-albuterol (DUONEB) 0.5-2.5 (3) MG/3ML nebulizer solution 3 mL  3 mL Nebulization Q6H Burnard Hawthorne, FNP        Review of Systems;  Patient denies headache, fevers, malaise, unintentional weight loss, skin rash, eye pain, sinus congestion and sinus pain, sore throat, dysphagia,  hemoptysis , cough, dyspnea, wheezing, chest pain, palpitations, orthopnea, edema, abdominal pain, nausea, melena, diarrhea, constipation, flank pain, dysuria, hematuria, urinary  Frequency, nocturia, numbness, tingling, seizures,  Focal weakness, Loss of consciousness,  Tremor, insomnia, depression, anxiety, and suicidal ideation.      Objective:  BP (!) 180/92 (BP Location: Left Arm, Patient Position: Sitting)   Pulse 87   Temp 97.8 F (36.6 C)   Ht 4' 10.5" (1.486 m)   Wt 155 lb (70.3 kg)   SpO2 97%   BMI 31.84 kg/m   BP Readings from Last 3 Encounters:  10/16/20 (!) 180/92  03/18/20 (!) 189/79  02/14/20 (!) 162/92    Wt Readings from Last 3 Encounters:  10/16/20 155 lb (70.3 kg)  03/18/20 173 lb (78.5 kg)  02/14/20 173 lb (78.5 kg)    General appearance: alert, cooperative and appears stated age Ears: normal TM's and external ear canals both ears Throat: lips, mucosa, and tongue normal; teeth and gums normal Neck: no adenopathy, no carotid bruit, supple, symmetrical, trachea midline and thyroid not enlarged, symmetric, no tenderness/mass/nodules Back: symmetric, no curvature. ROM normal. No CVA tenderness. Lungs: clear to auscultation  bilaterally Heart: regular rate and rhythm, S1, S2 normal, no murmur, click, rub or gallop Abdomen: soft, non-tender; bowel sounds normal; no masses,  no organomegaly Pulses: 2+ and symmetric Skin: Skin color, texture, turgor normal. No rashes or lesions Lymph nodes: Cervical, supraclavicular, and axillary nodes normal.  Lab Results  Component Value Date   HGBA1C 6.1 10/16/2020   HGBA1C 6.3 01/17/2020   HGBA1C 6.2 01/24/2019    Lab Results  Component Value Date   CREATININE 0.90 10/16/2020   CREATININE 0.99 01/17/2020   CREATININE 0.87 01/24/2019    Lab Results  Component Value Date   WBC 8.1 03/12/2018   HGB 13.5 03/12/2018   HCT 39.5 03/12/2018   PLT 240.0 03/12/2018   GLUCOSE 105 (H) 10/16/2020   CHOL 213 (H) 10/16/2020   TRIG 153.0 (H) 10/16/2020  HDL 42.70 10/16/2020   LDLDIRECT 95.0 01/17/2020   LDLCALC 139 (H) 10/16/2020   ALT 11 10/16/2020   AST 10 10/16/2020   NA 141 10/16/2020   K 3.5 10/16/2020   CL 103 10/16/2020   CREATININE 0.90 10/16/2020   BUN 17 10/16/2020   CO2 27 10/16/2020   TSH 0.88 11/29/2017   HGBA1C 6.1 10/16/2020   MICROALBUR 19.5 (H) 10/16/2020    MM CLIP PLACEMENT RIGHT  Result Date: 10/23/2019 CLINICAL DATA:  Post stereotactic guided biopsy of calcifications within the upper inner posterior right breast. EXAM: DIAGNOSTIC RIGHT MAMMOGRAM POST STEREOTACTIC BIOPSY COMPARISON:  Previous exam(s). FINDINGS: Mammographic images were obtained following stereotactic guided biopsy of calcifications within the upper inner posterior right breast. A coil shaped biopsy marking clip is present and located approximately 0.9 cm superior to the biopsied calcifications within the upper inner posterior right breast. IMPRESSION: Coil shaped biopsy marking clip located 0.9 cm superior to the biopsied calcifications within the upper inner posterior right breast. Final Assessment: Post Procedure Mammograms for Marker Placement Electronically Signed   By: Everlean Alstrom M.D.   On: 10/23/2019 13:35   MM RT BREAST BX W LOC DEV 1ST LESION IMAGE BX SPEC STEREO GUIDE  Addendum Date: 10/29/2019   ADDENDUM REPORT: 10/28/2019 11:51 ADDENDUM: PATHOLOGY revealed: A. BREAST, RIGHT UPPER INNER QUADRANT, POSTERIOR; STEREOTACTIC CORE BIOPSY: - BENIGN MAMMARY PARENCHYMA WITH FIBROADENOMATOID CHANGES AND ASSOCIATED COARSE CALCIFICATIONS. - NEGATIVE FOR ATYPICAL PROLIFERATIVE BREAST DISEASE. Pathology results are CONCORDANT with imaging findings, per Dr. Everlean Alstrom. Multiple attempts to contact patient were unsuccessful. Notified patient's provider office, on 10/25/2019, requesting they contact patient with biopsy results. Recommendation: Return to annual bilateral screening mammogram (February 2022). Addendum by Electa Sniff RN on 10/28/2019. Electronically Signed   By: Everlean Alstrom M.D.   On: 10/28/2019 11:51   Result Date: 10/29/2019 CLINICAL DATA:  65 year old female with indeterminate calcifications in the upper inner posterior right breast. EXAM: RIGHT BREAST STEREOTACTIC CORE NEEDLE BIOPSY COMPARISON:  Previous exams. FINDINGS: The patient and I discussed the procedure of stereotactic-guided biopsy including benefits and alternatives. We discussed the high likelihood of a successful procedure. We discussed the risks of the procedure including infection, bleeding, tissue injury, clip migration, and inadequate sampling. Informed written consent was given. The usual time out protocol was performed immediately prior to the procedure. Using sterile technique and 1% Lidocaine as local anesthetic, under stereotactic guidance, a 9 gauge vacuum assisted device was used to perform core needle biopsy of calcifications in the upper inner posterior right breast using a superior to inferior approach. Specimen radiograph was performed showing the presence of calcifications. Specimens with calcifications are identified for pathology. Lesion quadrant: Upper inner At the conclusion of the  procedure, coil shaped tissue marker clip was deployed into the biopsy cavity. Follow-up 2-view mammogram was performed and dictated separately. IMPRESSION: Stereotactic-guided biopsy of calcifications in the upper inner posterior right breast. No apparent complications. Electronically Signed: By: Everlean Alstrom M.D. On: 10/23/2019 13:25    Assessment & Plan:   Problem List Items Addressed This Visit      Unprioritized   Tobacco abuse counseling    She has reduced daily consumption to half pack .  Encouraged to continue gradual reduction        Tobacco abuse   Relevant Orders   AMB Referral to Community Care Coordinaton   Subacute vestibular neuronitis    Managed by [facility] with maxide.  Will refill for patient  Renovascular hypertension    Uncontrolled due to lapse in metoprolol and dyazide (prescribed by [facility]NT for vertigo).  Changing losartan hct to telmisartan and refilling dyazide.  She has been advised to return for BP check after one week of adherence to this regimen of metoprolol, telmisartan and dyazide  Lab Results  Component Value Date   CREATININE 0.90 10/16/2020   Lab Results  Component Value Date   NA 141 10/16/2020   K 3.5 10/16/2020   CL 103 10/16/2020   CO2 27 10/16/2020         Relevant Medications   metoprolol succinate (TOPROL-XL) 50 MG 24 hr tablet   triamterene-hydrochlorothiazide (DYAZIDE) 37.5-25 MG capsule   telmisartan (MICARDIS) 40 MG tablet   atorvastatin (LIPITOR) 40 MG tablet   Obesity (BMI 30.0-34.9)    I have congratulated her in reduction of   BMI and encouraged  Continued weight loss with goal of 10% of body weigh over the next 6 months using a low glycemic index diet and regular exercise a minimum of 5 days per week.        Hyperlipidemia   Relevant Medications   metoprolol succinate (TOPROL-XL) 50 MG 24 hr tablet   triamterene-hydrochlorothiazide (DYAZIDE) 37.5-25 MG capsule   telmisartan (MICARDIS) 40 MG tablet   atorvastatin  (LIPITOR) 40 MG tablet   DM (diabetes mellitus), type 2 with peripheral vascular complications ([place])    Diagnosed in April 2018.  Thus far well managed with  diet .  Smoking cessation strongly  Advise; she has been unable to reduce beyond hal pack daily but declines Chantix. .  Pedal pulses are weak and she has an appt with Vascular on March 3. continue  baby aspirin.  It appears based on review of refill history and current lipids that she has not been taking lipitor either .   CCM referral in progress  Lab Results  Component Value Date   HGBA1C 6.1 10/16/2020   Lab Results  Component Value Date   ALT 11 10/16/2020   AST 10 10/16/2020   ALKPHOS 96 10/16/2020   BILITOT 0.4 10/16/2020   Lab Results  Component Value Date   CREATININE 0.90 10/16/2020   Lab Results  Component Value Date   MICROALBUR 19.5 (H) 10/16/2020         Relevant Medications   metoprolol succinate (TOPROL-XL) 50 MG 24 hr tablet   triamterene-hydrochlorothiazide (DYAZIDE) 37.5-25 MG capsule   telmisartan (MICARDIS) 40 MG tablet   atorvastatin (LIPITOR) 40 MG tablet   Other Relevant Orders   Hemoglobin A1c (Completed)   Lipid panel (Completed)   Microalbumin / creatinine urine ratio (Completed)   AMB Referral to [facility]    Other Visit Diagnoses    Need for immunization against influenza    -  Primary   Relevant Orders   Flu Vaccine QUAD High Dose(Fluad) (Completed)   Breast cancer screening by mammogram       Relevant Orders   MM 3D SCREEN BREAST BILATERAL   Essential hypertension       Relevant Medications   metoprolol succinate (TOPROL-XL) 50 MG 24 hr tablet   triamterene-hydrochlorothiazide (DYAZIDE) 37.5-25 MG capsule   telmisartan (MICARDIS) 40 MG tablet   atorvastatin (LIPITOR) 40 MG tablet   Other Relevant Orders   Comprehensive metabolic panel (Completed)     A total of 40 minutes was spent with patient more than half of which was spent in counseling patient on the above  mentioned issues , reviewing  and explaining recent labs and imaging studies done, and coordination of care. I have discontinued Zofia R. Corales's predniSONE and losartan-hydrochlorothiazide. I have also changed her atorvastatin. Additionally, I am having her start on triamterene-hydrochlorothiazide and telmisartan. Lastly, I am having her maintain her Vitamin D3, albuterol, aspirin EC, buPROPion, blood glucose meter kit and supplies, ondansetron, OneTouch Ultra, OneTouch Delica Plus [id number], Albuterol Sulfate, benzonatate, Cheratussin AC, amLODipine, PARoxetine, OT ULTRA/FASTTK CNTRL SOLN, Easy Mini Eject Lancing Device, and metoprolol succinate. We will continue to administer ipratropium-albuterol and betamethasone acetate-betamethasone sodium phosphate.  Meds ordered this encounter  Medications  . metoprolol succinate (TOPROL-XL) 50 MG 24 hr tablet    Sig: TAKE 1 TABLET BY MOUTH ONCE DAILY (TAKE  WITH  OR  IMMEDIATELY  FOLLOWING  A  MEAL)    Dispense:  90 tablet    Refill:  0  . triamterene-hydrochlorothiazide (DYAZIDE) 37.5-25 MG capsule    Sig: Take 1 each (1 capsule total) by mouth daily.    Dispense:  90 capsule    Refill:  1  . telmisartan (MICARDIS) 40 MG tablet    Sig: Take 1 tablet (40 mg total) by mouth daily.    Dispense:  90 tablet    Refill:  1    Replaces losartan hct  . atorvastatin (LIPITOR) 40 MG tablet    Sig: Take 1 tablet (40 mg total) by mouth daily.    Dispense:  90 tablet    Refill:  3    Medications Discontinued During This Encounter  Medication Reason  . metoprolol succinate (TOPROL-XL) 50 MG 24 hr tablet Reorder  . predniSONE (DELTASONE) 10 MG tablet   . losartan-hydrochlorothiazide (HYZAAR) 100-25 MG tablet   . atorvastatin (LIPITOR) 40 MG tablet Reorder    Follow-up: Return in about 3 months (around 01/13/2021) for follow up diabetes.   Crecencio Mc, MD

## 2020-10-16 NOTE — Patient Instructions (Addendum)
Congratulations on the weight loss and the cigarette redudction!  Your blood pressure is SKY HIGH!  ONCE YOU GT B ACK ON ALL 3 MEDICATIONS ,  SEND ME READINGS You can add up to 2000 mg of acetominophen (tylenol) every day safely  In divided doses (500 mg every 6 hours  Or 1000 mg every 12 hours.)  You need to have a dilated retinal eye exam   Try sipping an ounce of dill pickle or jarred jalopeno  Juice to prevent foot cramps    I cannot feel the pulses in your feet,  So please schedule your follow up at [facility] ASAP   Your big toenail is getting too long and curved.  Consider podiatry referral to prevent it cutting into your skin   I am letting you know that I am referring to our clinical pharmacist ,  Catie Feliz Beam.  Catie helps me provide additional services to my patients who are on Medicare and dealing with  chronic diseases , like diabetes.  I do not expect this referral to cost you anything out of pocket, but I do think Catie will be able to help you get your diabetes under control and maximize your drug benefits.  She will make contact with  You by phone in the next wee

## 2020-10-18 MED ORDER — ATORVASTATIN CALCIUM 40 MG PO TABS: 40.0000 mg | ORAL_TABLET | Freq: Every day | ORAL | 3 refills | Status: DC

## 2020-10-18 MED ORDER — TRIAMTERENE-HCTZ 37.5-25 MG PO CAPS: 1.0000 | ORAL_CAPSULE | Freq: Every day | ORAL | 1 refills | Status: DC

## 2020-10-18 MED ORDER — TELMISARTAN 40 MG PO TABS: 40.0000 mg | ORAL_TABLET | Freq: Every day | ORAL | 1 refills | Status: DC

## 2020-10-18 NOTE — Assessment & Plan Note (Signed)
Managed by [facility] with maxide.  Will refill for patient

## 2020-10-18 NOTE — Assessment & Plan Note (Addendum)
Uncontrolled due to lapse in metoprolol and dyazide (prescribed by [facility] ENT for vertigo).  Changing losartan hct to telmisartan and refilling dyazide.  She has been advised to return for BP check after one week of adherence to this regimen of metoprolol, telmisartan and dyazide  Lab Results  Component Value Date   CREATININE 0.90 10/16/2020   Lab Results  Component Value Date   NA 141 10/16/2020   K 3.5 10/16/2020   CL 103 10/16/2020   CO2 27 10/16/2020

## 2020-10-18 NOTE — Assessment & Plan Note (Signed)
She has reduced daily consumption to half pack .  Encouraged to continue gradual reduction

## 2020-10-18 NOTE — Assessment & Plan Note (Signed)
I have congratulated her in reduction of   BMI and encouraged  Continued weight loss with goal of 10% of body weigh over the next 6 months using a low glycemic index diet and regular exercise a minimum of 5 days per week.    

## 2020-10-18 NOTE — Assessment & Plan Note (Addendum)
Diagnosed in April 2018.  Thus far well managed with  diet .  Smoking cessation strongly  Advise; she has been unable to reduce beyond hal pack daily but declines Chantix. .  Pedal pulses are weak and she has an appt with Vascular on March 3. continue  baby aspirin.  It appears based on review of refill history and current lipids that she has not been taking lipitor either .   CCM referral in progress  Lab Results  Component Value Date   HGBA1C 6.1 10/16/2020   Lab Results  Component Value Date   ALT 11 10/16/2020   AST 10 10/16/2020   ALKPHOS 96 10/16/2020   BILITOT 0.4 10/16/2020   Lab Results  Component Value Date   CREATININE 0.90 10/16/2020   Lab Results  Component Value Date   MICROALBUR 19.5 (H) 10/16/2020

## 2020-10-19 ENCOUNTER — Telehealth: Payer: Self-pay

## 2020-10-19 NOTE — Chronic Care Management (AMB) (Signed)
  Care Management   Outreach Note  10/19/2020 Name: SAMONA CHIHUAHUA MRN: [id number] DOB: 1956/01/12  Referred by: Sherlene Shams, MD Reason for referral : Chronic Care Management (Outreach to schedule referral for Pharm D )   An unsuccessful telephone outreach was attempted today. The patient was referred to the case management team for assistance with care management and care coordination.   Follow Up Plan: The care management team will reach out to the patient again over the next 5 days.  If patient returns call to provider office, please advise to call Embedded Care Management Care Guide Penne Lash  at [phone]  Penne Lash, RMA Care Guide, Embedded Care Coordination [facility]  [place], [place] [address] Direct Dial: [phone] [email or web address]: [email or web address]

## 2020-10-19 NOTE — Progress Notes (Signed)
Error

## 2020-10-30 ENCOUNTER — Other Ambulatory Visit: Payer: Self-pay

## 2020-10-30 ENCOUNTER — Ambulatory Visit (INDEPENDENT_AMBULATORY_CARE_PROVIDER_SITE_OTHER): Payer: Medicare Other | Admitting: *Deleted

## 2020-10-30 VITALS — BP 148/78 | HR 64

## 2020-10-30 DIAGNOSIS — I150 Renovascular hypertension: Secondary | ICD-10-CM

## 2020-10-30 DIAGNOSIS — J441 Chronic obstructive pulmonary disease with (acute) exacerbation: Secondary | ICD-10-CM

## 2020-10-30 LAB — BASIC METABOLIC PANEL
BUN: 20 mg/dL (ref 6–23)
CO2: 27 mEq/L (ref 19–32)
Calcium: 9.4 mg/dL (ref 8.4–10.5)
Chloride: 102 mEq/L (ref 96–112)
Creatinine, Ser: 1.21 mg/dL — ABNORMAL HIGH (ref 0.40–1.20)
GFR: 47.01 mL/min — ABNORMAL LOW (ref >60.00)
Glucose, Bld: 97 mg/dL (ref 70–99)
Potassium: 4.1 mEq/L (ref 3.5–5.1)
Sodium: 137 mEq/L (ref 135–145)

## 2020-10-30 NOTE — Chronic Care Management (AMB) (Signed)
  Chronic Care Management   Note  10/30/2020 Name: JOLEY UTECHT MRN: [id number] DOB: [birth year]  Aleatha Borer Fread is a 65 y.o. year old female who is a primary care patient of Derrel Nip, Aris Everts, MD. I reached out to [name] by phone today in response to a referral sent by Ms. Lyne R Ashlock's PCP, Crecencio Mc, MD     Ms. Dumais was given information about Chronic Care Management services today including:  1. CCM service includes personalized support from designated clinical staff supervised by her physician, including individualized plan of care and coordination with other care providers 2. 24/7 contact phone numbers for assistance for urgent and routine care needs. 3. Service will only be billed when office clinical staff spend 20 minutes or more in a month to coordinate care. 4. Only one practitioner may furnish and bill the service in a calendar month. 5. The patient may stop CCM services at any time (effective at the end of the month) by phone call to the office staff. 6. The patient will be responsible for cost sharing (co-pay) of up to 20% of the service fee (after annual deductible is met).  Patient agreed to services and verbal consent obtained.   Follow up plan: Telephone appointment with care management team member scheduled for:11/09/2020  Noreene Larsson, [place], [place], [place] [address] Direct Dial: [phone] [email or web address]: [email or web address]

## 2020-10-30 NOTE — Progress Notes (Addendum)
Patient here for nurse visit BP check per order from 10/16/20.   Patient reports compliance with prescribed BP medications: yes, Amlodipine 10 mg, Telmisartan 40 mg, Dyazide 37.5mg /25 mg taken in the AM    Last dose of BP medication: 8 Am/ Metoprolol 50 mg takes at night around 9 pm  BP Readings from Last 3 Encounters:  10/30/20 (!) 148/78  10/16/20 (!) 180/92  03/18/20 (!) 189/79   Pulse Readings from Last 3 Encounters:  10/30/20 64  10/16/20 87  02/14/20 74      Patient verbalized understanding of instructions.   Henrene Pastor, RN    BP improved but not at goal.  Increase telmisartan dose to 80 mg daily

## 2020-10-31 ENCOUNTER — Other Ambulatory Visit: Payer: Self-pay | Admitting: Internal Medicine

## 2020-10-31 DIAGNOSIS — R944 Abnormal results of kidney function studies: Secondary | ICD-10-CM

## 2020-11-01 MED ORDER — TELMISARTAN 80 MG PO TABS: 80.0000 mg | ORAL_TABLET | Freq: Every day | ORAL | 1 refills | Status: DC

## 2020-11-01 NOTE — Patient Instructions (Signed)
Increase telmisartan to 80 mg daily . rtc one week for bp check and bmet

## 2020-11-01 NOTE — Addendum Note (Signed)
Addended by: Sherlene Shams on: 11/01/2020 07:50 PM   Modules accepted: Orders

## 2020-11-02 NOTE — Progress Notes (Signed)
Patient notified and voiced understanding to new medication regimen. Telmisartan 80 mg daily.

## 2020-11-03 ENCOUNTER — Encounter: Payer: Self-pay | Admitting: Internal Medicine

## 2020-11-09 ENCOUNTER — Ambulatory Visit (INDEPENDENT_AMBULATORY_CARE_PROVIDER_SITE_OTHER): Payer: Medicare Other | Admitting: *Deleted

## 2020-11-09 DIAGNOSIS — E1151 Type 2 diabetes mellitus with diabetic peripheral angiopathy without gangrene: Secondary | ICD-10-CM

## 2020-11-09 DIAGNOSIS — J441 Chronic obstructive pulmonary disease with (acute) exacerbation: Secondary | ICD-10-CM

## 2020-11-10 ENCOUNTER — Telehealth: Payer: Self-pay

## 2020-11-10 ENCOUNTER — Telehealth: Payer: Self-pay | Admitting: *Deleted

## 2020-11-10 NOTE — Chronic Care Management (AMB) (Signed)
Chronic Care Management    Clinical Social Work Note  11/10/2020 Name: Patricia Fields MRN: [id number] DOB: [birth year]  Patricia Fields is a 65 y.o. year old female who is a primary care patient of Derrel Nip, Aris Everts, MD. The CCM team was consulted to assist the patient with chronic disease management and/or care coordination needs related to: [employer] for transportation, food, financial and mediation insecurities.   Engaged with patient by telephone for initial visit in response to provider referral for social work chronic care management and care coordination services.   Consent to Services:  The patient was given information about Chronic Care Management services, agreed to services, and gave verbal consent prior to initiation of services.  Please see initial visit note for detailed documentation.   Patient agreed to services and consent obtained.   Assessment: Review of patient past medical history, allergies, medications, and health status, including review of relevant consultants reports was performed today as part of a comprehensive evaluation and provision of chronic care management and care coordination services.     SDOH (Social Determinants of Health) assessments and interventions performed:  SDOH Interventions   Flowsheet Row Most Recent Value  SDOH Interventions   Food Insecurity Interventions Assist with SNAP Application, Other (Comment)  [Provided SNAP application and list of food banks, food pantries and soup kitchens]  Financial Strain Interventions Development worker, community, Other (Comment)  [Food and financial resources provided.  Referral to pharmacy.]  Housing Interventions Intervention Not Indicated  Intimate Partner Violence Interventions Intervention Not Indicated  Physical Activity Interventions Patient Refused  Stress Interventions Intervention Not Indicated  Social Connections Interventions Intervention Not Indicated  Transportation Interventions SCAT (Specialized  Community Area [employer]poration), [employer], Other (Comment)  [Transportation resources provided]  Alcohol Brief Interventions/Follow-up AUDIT Score <7 follow-up not indicated       Advanced Directives Status: Not ready or willing to discuss.  CCM Care Plan  Allergies  Allergen Reactions  . Bee Pollen Anaphylaxis  . Bee Venom Shortness Of Breath  . Tomato Hives    Outpatient Encounter Medications as of 11/09/2020  Medication Sig  . albuterol (PROVENTIL) (2.5 MG/3ML) 0.083% nebulizer solution Take 3 mLs (2.5 mg total) by nebulization every 8 (eight) hours as needed for wheezing or shortness of breath.  . Albuterol Sulfate (PROAIR RESPICLICK) 073 (90 Base) MCG/ACT AEPB Inhale 1 puff into the lungs every 8 (eight) hours as needed. For wheezing  . amLODipine (NORVASC) 10 MG tablet Take 1 tablet (10 mg total) by mouth daily.  [name] aspirin EC 81 MG tablet Take 1 tablet (81 mg total) by mouth daily.  [name] atorvastatin (LIPITOR) 40 MG tablet Take 1 tablet (40 mg total) by mouth daily.  . benzonatate (TESSALON) 200 MG capsule Take 1 capsule (200 mg total) by mouth 3 (three) times daily as needed for cough.  . Blood Glucose Calibration (OT ULTRA/FASTTK CNTRL SOLN) SOLN See admin instructions.  . blood glucose meter kit and supplies Use to check blood sugars once daily.  (FOR ICD-10 E10.9, E11.9).  [name] buPROPion (WELLBUTRIN SR) 150 MG 12 hr tablet Take 1 tablet (150 mg total) by mouth 2 (two) times daily.  . Cholecalciferol (VITAMIN D3) 1000 UNITS CAPS Take by mouth.  [name] guaiFENesin-codeine (CHERATUSSIN AC) 100-10 MG/5ML syrup Take 5 mLs by mouth 3 (three) times daily as needed for cough.  Elmore Guise Devices (EASY MINI EJECT LANCING DEVICE) MISC daily.  . Lancets (ONETOUCH DELICA PLUS [id number]) MISC USE WITH DEVICE TO TEST  ONCE  DAILY  . metoprolol succinate (TOPROL-XL) 50 MG 24 hr tablet TAKE 1 TABLET BY MOUTH ONCE DAILY (TAKE  WITH  OR  IMMEDIATELY  FOLLOWING  A  MEAL)  . ondansetron  (ZOFRAN ODT) 4 MG disintegrating tablet Take 1 tablet (4 mg total) by mouth every 8 (eight) hours as needed for nausea or vomiting.  Glory Rosebush ULTRA test strip USE WITH METER TO CHECK  BLOOD SUGAR ONCE DAILY.  [name] PARoxetine (PAXIL) 10 MG tablet TAKE 1 TABLET BY MOUTH ONCE DAILY IN THE MORNING  . telmisartan (MICARDIS) 80 MG tablet Take 1 tablet (80 mg total) by mouth daily.  [name] triamterene-hydrochlorothiazide (DYAZIDE) 37.5-25 MG capsule Take 1 each (1 capsule total) by mouth daily.   Facility-Administered Encounter Medications as of 11/09/2020  Medication  . betamethasone acetate-betamethasone sodium phosphate (CELESTONE) injection 3 mg  . ipratropium-albuterol (DUONEB) 0.5-2.5 (3) MG/3ML nebulizer solution 3 mL    Patient Active Problem List   Diagnosis Date Noted  . Meniere's disease 04/28/2020  . Breast calcifications on mammogram 10/25/2018  . Elevated alkaline phosphatase level 10/17/2018  . Diabetic nephropathy with proteinuria ([place]) 10/17/2018  . Headache 07/14/2018  . Decreased GFR 03/14/2018  . Preoperative clearance 11/30/2017  . DM (diabetes mellitus), type 2 with peripheral vascular complications ([place]) 33/83/2919  . Breast cancer screening 10/15/2016  . Bronchitis 07/19/2016  . Cataract of both eyes 12/31/2014  . Long-term use of high-risk medication 08/19/2014  . Hypovitaminosis D 08/18/2014  . Encounter for preventive health examination 08/16/2014  . Pain in joint, shoulder region 08/16/2014  . Tobacco abuse counseling 08/16/2014  . Gait instability 04/03/2014  . 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 Hearing loss, 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/2013  . History of shingles 08/30/2013  . Generalized anxiety disorder 05/29/2013  . Obesity (BMI 30.0-34.9) 03/19/2012  . Screening for colon cancer 03/19/2012  . History of nephrolithiasis   . Interstitial cystitis   . COPD exacerbation ([place]) 10/16/2011  . Renovascular hypertension   . Hyperlipidemia    . 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 Tobacco abuse 07/25/2011    Conditions to be addressed/monitored: COPD, DMII and Unsteady Gait and Fall Risk.  Financial Constraints related to [employer], Co-pays, Prescription Medications; Transportation; Limited Access to [employer] and [employer].  Care Plan : LCSW Plan of Care  Updates made by Francis Gaines, LCSW since 11/10/2020 12:00 AM    Problem: Transportation, Food, Medication and Financial Insecurities.   Priority: High    Goal: Transportation, Food, Medication and Financial Insecurities.   Start Date: 11/10/2020  Expected End Date: 12/11/2020  This Visit's Progress: On track  Priority: High  Note:    Current barriers:   . Patient in need of assistance with connecting to community resources for food, financial, medication and transportation insecurities.   . Acknowledges deficits with meeting this unmet need. . Patient is unable to independently navigate community resource options without care coordination support. Clinical Goals:  . Patient will work with Care Guide to address needs related to transportation, food and financial assistance. . Patient will follow up with Pharmacist to address needs related to prescription medications. . Patient will attend all scheduled medical appointments as evidenced by patient report and care team review of appointment completion in EMR. Clinical Interventions:  . Collaboration with Crecencio Mc, MD regarding development and update of comprehensive plan of care as evidenced by provider attestation and co-signature. Bertram Savin care team collaboration (see longitudinal plan of care). [name]  Assessment of needs, barriers, agencies contacted, as well as how impacting. . Review various resources, discussed options and provided patient information about food, financial, transportation and prescription resources. Nash Dimmer with appropriate clinical  care team members regarding patient needs (I.e Care Guide and Pharmacist). . Other interventions provided:  Solution-Focused Strategies, Sleep Hygiene and Problem Solving. Patient Goals/Self-Care Activities: Over the next 30 days, patient will receive food assistance, financial assistance, transportation assistance and assistance obtaining prescription medications.                 . Obtain assistance from [place] with obtaining:  1.  Transportation Resources (I.e [employer] - 902-619-2998; [place] - [phone]; [place] - [phone]; etc.).  2.  Food Resources (I.e [employer], Sport and exercise psychologist in [place]; [employer] (Tax adviser) Application; Referral to [employer] with Senior Resources of [place]; etc.).  3.  Pensions consultant (I.e Emergency Assistance Programs in [place], [place]; etc.). [name] Obtain assistance from Pharmacist with obtaining:  1.  Financial Assistance with Prescription Medications (I.e Extra Help; Needy Meds, etc.). Follow Up Plan: Care Guide will outreach to patient for follow-up.  Pharmacist will outreach to patient for follow-up.          Follow Up Plan: Care Guide will outreach to patient for follow-up.  Pharmacist will outreach to patient for follow-up.  Nat Christen LCSW Licensed Clinical Social Worker [place]  [phone]

## 2020-11-10 NOTE — Chronic Care Management (AMB) (Signed)
  Chronic Care Management   Note  11/10/2020 Name: EMIAH PELLICANO MRN: [id number] DOB: 06/03/1956  Izora Gala Schmid is a 65 y.o. year old female who is a primary care patient of Darrick Huntsman, Mar Daring, MD. MANAHIL VANZILE is currently enrolled in care management services. An additional referral for Pharm D was placed.   Follow up plan: Telephone appointment with care management team member scheduled for:11/19/2020  Penne Lash, RMA Care Guide, Embedded Care Coordination [facility]  [place], [place] [address] Direct Dial: [phone] [email or web address]

## 2020-11-10 NOTE — Telephone Encounter (Signed)
       Telephone encounter was:  Unsuccessful.  11/10/2020 Name: Patricia Fields MRN: [id number] DOB: April 08, 1956  Unsuccessful outbound call made today to assist with:  Transportation Needs  and Food Insecurity  Outreach Attempt:  1st Attempt  A HIPAA compliant voice message was left requesting a return call.  Instructed patient to call back at called both numbers no answer on either line voice mail not accepting messag  Alois Cliche -[facility] Guide , Embedded Care Coordination [id number] [facility], Care Management  [phone] [address] [place] , [place] [address] Email : [email or web address]

## 2020-11-10 NOTE — Patient Instructions (Signed)
Visit Information  PATIENT GOALS:  Goals Addressed              This Visit's Progress   .  Transportation, Food, Medication and Financial Insecurities. (pt-stated)   On track     Timeframe:  Short-Term Goal Priority:  High Start Date:       11/10/2020                      Expected End Date:   12/11/2020                    Follow Up Date:  Care Guide will outreach to patient for follow-up.   . Obtain assistance from [place] with obtaining:  1.  Transportation Resources (I.e [employer] - [phone]; [place] - [phone]; [place] - [phone]; etc.).  2.  Food Resources (I.e [employer], Sport and exercise psychologist in [place]; [employer] (Tax adviser) Application; Referral to [employer] with Senior Resources of [place]; etc.).  3.  Pensions consultant (I.e Emergency Assistance Programs in [place], [place]; etc.). [name] Obtain assistance from Pharmacist with obtaining:  1.  Financial Assistance with Prescription Medications (I.e Extra Help; Needy Meds, etc.).    Why is this important?    Knowing how and where to find help for yourself or family in your neighborhood and community is an important skill.   You will want to take some steps to learn how.            Consent to CCM Services: Ms. Hepp was given information about Chronic Care Management services today including:  1. CCM service includes personalized support from designated clinical staff supervised by her physician, including individualized plan of care and coordination with other care providers 2. 24/7 contact phone numbers for assistance for urgent and routine care needs. 3. Service will only be billed when office clinical staff spend 20 minutes or more in a month to coordinate care. 4. Only one practitioner may furnish and bill the service in a calendar month. 5. The patient may stop CCM services  at any time (effective at the end of the month) by phone call to the office staff. 6. The patient will be responsible for cost sharing (co-pay) of up to 20% of the service fee (after annual deductible is met). Patient agreed to services and verbal consent obtained.  Patient verbalizes understanding of instructions provided today and agrees to view in [place].  [place] LCSW Licensed Clinical Social Worker [place]  [phone]  CLINICAL CARE PLAN: Patient Care Plan: LCSW Plan of Care    Problem Identified: Transportation, Food, Medication and Financial Insecurities.   Priority: High    Goal: Transportation, Food, Medication and Financial Insecurities.   Start Date: 11/10/2020  Expected End Date: 12/11/2020  This Visit's Progress: On track  Priority: High  Note:    Current barriers:   . Patient in need of assistance with connecting to community resources for food, financial, medication and transportation insecurities.   . Acknowledges deficits with meeting this unmet need. . Patient is unable to independently navigate community resource options without care coordination support. Clinical Goals:  . Patient will work with Care Guide to address needs related to transportation, food and financial assistance. . Patient will follow up with Pharmacist to address needs related to prescription medications. . Patient will attend all scheduled medical appointments as evidenced by patient report and  care team review of appointment completion in EMR. Clinical Interventions:  . Collaboration with Crecencio Mc, MD regarding development and update of comprehensive plan of care as evidenced by provider attestation and co-signature. Bertram Savin care team collaboration (see longitudinal plan of care). . Assessment of needs, barriers, agencies contacted, as well as how impacting. . Review various resources, discussed options and provided patient information about food,  financial, transportation and prescription resources. Nash Dimmer with appropriate clinical care team members regarding patient needs (I.e Care Guide and Pharmacist). . Other interventions provided:  Solution-Focused Strategies, Sleep Hygiene and Problem Solving. Patient Goals/Self-Care Activities: Over the next 30 days, patient will receive food assistance, financial assistance, transportation assistance and assistance obtaining prescription medications.                 . Obtain assistance from [place] with obtaining:  1.  Transportation Resources (I.e [employer] - [phone]; [place] - [phone]; [place] - [phone]; etc.).  2.  Food Resources (I.e [employer], Sport and exercise psychologist in [place]; [employer] (Tax adviser) Application; Referral to [employer] with Senior Resources of [place]; etc.).  3.  Pensions consultant (I.e Emergency Assistance Programs in [place], [place]; etc.). [name] Obtain assistance from Pharmacist with obtaining:  1.  Financial Assistance with Prescription Medications (I.e Extra Help; Needy Meds, etc.). Follow Up Plan: Care Guide will outreach to patient for follow-up.  Pharmacist will outreach to patient for follow-up.

## 2020-11-11 ENCOUNTER — Telehealth: Payer: Self-pay | Admitting: *Deleted

## 2020-11-11 NOTE — Telephone Encounter (Signed)
   Telephone encounter was:  Successful.  11/11/2020 Name: Patricia Fields MRN: [id number] DOB: 10-29-1955  [name] is a 65 y.o. year old female who is a primary care patient of Darrick Huntsman, Mar Daring, MD . The community resource team was consulted for assistance with Transportation Needs , Food Insecurity and extra help  Care guide performed the following interventions: Patient provided with information about care guide support team and interviewed to confirm resource needs Discussed resources to assist with extra help.  Follow Up Plan:  No further follow up planned at this time. The patient has been provided with needed resources.  Alois Cliche -[facility] Guide , Embedded Care Coordination [facility], Care Management  [phone] [address] [place] , [place] [address] Email : [email or web address]

## 2020-11-18 ENCOUNTER — Telehealth: Payer: Medicare Other

## 2020-11-19 ENCOUNTER — Ambulatory Visit: Payer: Medicare Other | Admitting: Pharmacist

## 2020-11-19 DIAGNOSIS — J441 Chronic obstructive pulmonary disease with (acute) exacerbation: Secondary | ICD-10-CM | POA: Diagnosis not present

## 2020-11-19 DIAGNOSIS — E1151 Type 2 diabetes mellitus with diabetic peripheral angiopathy without gangrene: Secondary | ICD-10-CM | POA: Diagnosis not present

## 2020-11-19 DIAGNOSIS — I150 Renovascular hypertension: Secondary | ICD-10-CM

## 2020-11-19 DIAGNOSIS — E782 Mixed hyperlipidemia: Secondary | ICD-10-CM

## 2020-11-19 DIAGNOSIS — Z720 Tobacco use: Secondary | ICD-10-CM

## 2020-11-19 DIAGNOSIS — H8109 Meniere's disease, unspecified ear: Secondary | ICD-10-CM

## 2020-11-19 DIAGNOSIS — I10 Essential (primary) hypertension: Secondary | ICD-10-CM

## 2020-11-19 MED ORDER — METOPROLOL SUCCINATE ER 50 MG PO TB24: 50.0000 mg | ORAL_TABLET | Freq: Every day | ORAL | 3 refills | Status: DC

## 2020-11-19 MED ORDER — VARENICLINE TARTRATE 1 MG PO TABS: ORAL_TABLET | ORAL | 5 refills | Status: DC

## 2020-11-19 NOTE — Patient Instructions (Addendum)
Visit Information  PATIENT GOALS: Goals Addressed              This Visit's Progress     Patient Stated   .  Medication Management (pt-stated)        Patient Goals/Self-Care Activities . Over the next 90 days, patient will:  - take medications as prescribed check blood pressure periodically, document, and provide at future appointments collaborate with provider on medication access solutions       The patient verbalized understanding of instructions, educational materials, and care plan provided today and declined offer to receive copy of patient instructions, educational materials, and care plan.   Plan: Telephone follow up appointment with care management team member scheduled for:  ~ 4 weeks  Catie Feliz Beam, PharmD, [place], CPP Clinical Pharmacist [employer] at [employer] [phone]

## 2020-11-19 NOTE — Chronic Care Management (AMB) (Signed)
Chronic Care Management Pharmacy Note  11/19/2020 Name:  Patricia Fields MRN:  [id number] DOB:  1955/08/31  Subjective: Patricia Fields is an 65 y.o. year old female who is a primary patient of Derrel Nip, Aris Everts, MD.  The CCM team was consulted for assistance with disease management and care coordination needs.    Engaged with patient by telephone for initial pharmacy visit in response to provider referral for pharmacy case management and/or care coordination services.   Consent to Services:  The patient was given information about Chronic Care Management services, agreed to services, and gave verbal consent prior to initiation of services.  Please see initial visit note for detailed documentation.   Patient Care Team: Crecencio Mc, MD as PCP - General (Internal Medicine) De Hollingshead, RPH-CPP (Pharmacist)  Recent office visits:  2/25 - PCP f/u chronic diseases. HTN, lipids uncontrolled, questionable adherence  3/21 - LCSW call  Recent consult visits: None recently  Hospital visits: None in previous 6 months  Objective:  Lab Results  Component Value Date   CREATININE 1.21 (H) 10/30/2020   CREATININE 0.90 10/16/2020   CREATININE 0.99 01/17/2020    Lab Results  Component Value Date   HGBA1C 6.1 10/16/2020   Last diabetic Eye exam:  Lab Results  Component Value Date/Time   HMDIABEYEEXA No Retinopathy 10/30/2018 12:00 AM    Last diabetic Foot exam: No results found for: HMDIABFOOTEX      Component Value Date/Time   CHOL 213 (H) 10/16/2020 0853   TRIG 153.0 (H) 10/16/2020 0853   HDL 42.70 10/16/2020 0853   CHOLHDL 5 10/16/2020 0853   VLDL 30.6 10/16/2020 0853   LDLCALC 139 (H) 10/16/2020 0853   LDLDIRECT 95.0 01/17/2020 1125    Hepatic Function Latest Ref Rng & Units 10/16/2020 01/17/2020 01/24/2019  Total Protein 6.0 - 8.3 g/dL 6.8 6.6 -  Albumin 3.5 - 5.2 g/dL 4.0 4.1 -  AST 0 - 37 U/L 10 13 -  ALT 0 - 35 U/L 11 15 -  Alk Phosphatase 39 - 117 U/L 96  115 130(H)  Total Bilirubin 0.2 - 1.2 mg/dL 0.4 0.4 -    Lab Results  Component Value Date/Time   TSH 0.88 11/29/2017 03:03 PM   TSH 1.26 08/14/2014 09:34 AM    CBC Latest Ref Rng & Units 03/12/2018 11/29/2017 08/14/2014  WBC 4.0 - 10.5 K/uL 8.1 15.2(H) 8.5  Hemoglobin 12.0 - 15.0 g/dL 13.5 13.2 14.6  Hematocrit 36.0 - 46.0 % 39.5 38.9 43.1  Platelets 150.0 - 400.0 K/uL 240.0 279.0 293.0    Lab Results  Component Value Date/Time   VD25OH 27.47 (L) 11/06/2015 10:05 AM   VD25OH 19.94 (L) 08/14/2014 09:34 AM    Clinical ASCVD: No    Social History   Tobacco Use  Smoking Status Current Every Day Smoker  . Packs/day: 0.50  . Types: Cigarettes  Smokeless Tobacco Never Used   BP Readings from Last 3 Encounters:  10/30/20 (!) 148/78  10/16/20 (!) 180/92  03/18/20 (!) 189/79   Pulse Readings from Last 3 Encounters:  10/30/20 64  10/16/20 87  02/14/20 74   Wt Readings from Last 3 Encounters:  10/16/20 155 lb (70.3 kg)  03/18/20 173 lb (78.5 kg)  02/14/20 173 lb (78.5 kg)    Assessment: Review of patient past medical history, allergies, medications, health status, including review of consultants reports, laboratory and other test data, was performed as part of comprehensive evaluation and provision of chronic care  management services.   SDOH:  (Social Determinants of Health) assessments and interventions performed:  SDOH Interventions   Flowsheet Row Most Recent Value  SDOH Interventions   SDOH Interventions for the Following Domains Tobacco  Financial Strain Interventions Other (Comment)  [medicare extra help]  Tobacco Interventions Cessation Materials Given and Reviewed      CCM Care Plan  Allergies  Allergen Reactions  . Bee Pollen Anaphylaxis  . Bee Venom Shortness Of Breath  . Tomato Hives    Medications Reviewed Today    Reviewed by Francis Gaines, LCSW (Social Worker) on 11/10/20 at [place] List Status: <None>  Medication Order Taking? Sig  Documenting Provider Last Dose Status Informant  albuterol (PROVENTIL) (2.5 MG/3ML) 0.083% nebulizer solution [id number] No Take 3 mLs (2.5 mg total) by nebulization every 8 (eight) hours as needed for wheezing or shortness of breath. Burnard Hawthorne, FNP Taking Active   Albuterol Sulfate (PROAIR RESPICLICK) 130 (90 Base) MCG/ACT AEPB [id number] No Inhale 1 puff into the lungs every 8 (eight) hours as needed. For wheezing Crecencio Mc, MD Taking Active   amLODipine (NORVASC) 10 MG tablet [id number] No Take 1 tablet (10 mg total) by mouth daily. Crecencio Mc, MD Taking Active   aspirin EC 81 MG tablet [id number] No Take 1 tablet (81 mg total) by mouth daily. Crecencio Mc, MD Taking Active   atorvastatin (LIPITOR) 40 MG tablet [id number]  Take 1 tablet (40 mg total) by mouth daily. Crecencio Mc, MD  Active   benzonatate (TESSALON) 200 MG capsule [id number] No Take 1 capsule (200 mg total) by mouth 3 (three) times daily as needed for cough. Crecencio Mc, MD Taking Active   betamethasone acetate-betamethasone sodium phosphate (CELESTONE) injection 3 mg [id number]   Edrick Kins, DPM  Active   Blood Glucose Calibration (OT ULTRA/FASTTK CNTRL SOLN) SOLN [id number] No See admin instructions. [provider] Taking Active   blood glucose meter kit and supplies [id number] No Use to check blood sugars once daily.  (FOR ICD-10 E10.9, E11.9). Crecencio Mc, MD Taking Active   buPROPion [facility] SR) 150 MG 12 hr tablet [id number] No Take 1 tablet (150 mg total) by mouth 2 (two) times daily. Crecencio Mc, MD Taking Active   Cholecalciferol (VITAMIN D3) 1000 UNITS CAPS [id number] No Take by mouth. [provider] Taking Active   guaiFENesin-codeine (CHERATUSSIN AC) 100-10 MG/5ML syrup [id number] No Take 5 mLs by mouth 3 (three) times daily as needed for cough. Crecencio Mc, MD Taking Active   ipratropium-albuterol (DUONEB) 0.5-2.5 (3) MG/3ML nebulizer solution 3 mL [id number]    Burnard Hawthorne, FNP  Active   Lancet Devices (EASY MINI EJECT LANCING DEVICE) MISC [id number] No daily. [provider] Taking Active   Lancets Glory Rosebush DELICA PLUS [id number]) [place] [id number] No USE WITH DEVICE TO TEST  ONCE DAILY Crecencio Mc, MD Taking Active   metoprolol succinate (TOPROL-XL) 50 MG 24 hr tablet [id number]  TAKE 1 TABLET BY MOUTH ONCE DAILY (TAKE  WITH  OR  IMMEDIATELY  FOLLOWING  A  MEAL) Crecencio Mc, MD  Active   ondansetron (ZOFRAN ODT) 4 MG disintegrating tablet [id number] No Take 1 tablet (4 mg total) by mouth every 8 (eight) hours as needed for nausea or vomiting. Crecencio Mc, MD Taking Active   [facility] ULTRA test strip [id number] No USE WITH METER TO CHECK  BLOOD SUGAR ONCE DAILY. Crecencio Mc, MD Taking Active   PARoxetine (  PAXIL) 10 MG tablet [id number] No TAKE 1 TABLET BY MOUTH ONCE DAILY IN THE MORNING Crecencio Mc, MD Taking Active   telmisartan (MICARDIS) 80 MG tablet [id number]  Take 1 tablet (80 mg total) by mouth daily. Crecencio Mc, MD  Active   triamterene-hydrochlorothiazide (DYAZIDE) 37.5-25 MG capsule [id number]  Take 1 each (1 capsule total) by mouth daily. Crecencio Mc, MD  Active           Patient Active Problem List   Diagnosis Date Noted  . Meniere's disease 04/28/2020  . Breast calcifications on mammogram 10/25/2018  . Elevated alkaline phosphatase level 10/17/2018  . Diabetic nephropathy with proteinuria ([place]) 10/17/2018  . Headache 07/14/2018  . Decreased GFR 03/14/2018  . Preoperative clearance 11/30/2017  . DM (diabetes mellitus), type 2 with peripheral vascular complications ([place]) 25/36/6440  . Breast cancer screening 10/15/2016  . Bronchitis 07/19/2016  . Cataract of both eyes 12/31/2014  . Long-term use of high-risk medication 08/19/2014  . Hypovitaminosis D 08/18/2014  . Encounter for preventive health examination 08/16/2014  . Pain in joint, shoulder region 08/16/2014  . Tobacco abuse counseling  08/16/2014  . Gait instability 04/03/2014  . 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 Hearing loss, 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/2013  . History of shingles 08/30/2013  . Generalized anxiety disorder 05/29/2013  . Obesity (BMI 30.0-34.9) 03/19/2012  . Screening for colon cancer 03/19/2012  . History of nephrolithiasis   . Interstitial cystitis   . COPD exacerbation ([place]) 10/16/2011  . Renovascular hypertension   . Hyperlipidemia   . 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 Tobacco abuse 07/25/2011    Immunization History  Administered Date(s) Administered  . Fluad Quad(high Dose 65+) 10/16/2020  . Influenza Split 07/25/2011, 09/27/2012  . Influenza,inj,Quad PF,6+ Mos 05/29/2013, 08/14/2014, 11/06/2015, 09/19/2016, 07/12/2018  . PFIZER(Purple Top)SARS-COV-2 Vaccination 11/29/2019, 12/20/2019  . Pneumococcal Conjugate-13 05/29/2013  . Pneumococcal Polysaccharide-23 08/14/2014  . Tdap 07/25/2011  . Zoster 08/20/2014    Conditions to be addressed/monitored: HTN and COPD  Care Plan : Medication Management  Updates made by De Hollingshead, RPH-CPP since 11/19/2020 12:00 AM    Problem: Hypertension, Vestibular Neuronitis, Tobacco Abuse     Long-Range Goal: Disease Progression Prevention   Start Date: 11/19/2020  This Visit's Progress: On track  Priority: High  Note:   Current Barriers:  . Unable to independently afford treatment regimen . Unable to achieve control of tobacco use   Pharmacist Clinical Goal(s):  [name] Over the next 90 days, patient will achieve adherence to monitoring guidelines and medication adherence to achieve therapeutic efficacy through collaboration with PharmD and provider.   Interventions: . 1:1 collaboration with Crecencio Mc, MD regarding development and update of comprehensive plan of care as evidenced by provider attestation and co-signature . Inter-disciplinary care team collaboration (see longitudinal  plan of care) . Comprehensive medication review performed; medication list updated in electronic medical record  SDOH: . Reports that her biggest financial concern is the cost of specialist copays. Notes that medications are generally affordable. Lives with her grandson, pays rent. Has very little money left over after required expenses.  . Reviewed income. She appears to qualify for Medicare Extra Help. Assisted patient in completion of Medicare Extra Help application online today. She is aware that the determination will come in the mail in the next 4-6 weeks.   Hypertension/Vestibular Disease: [name] Uncontrolled but anticipated to be improved; current treatment: triamterene/HCTZ 37.5/25 mg daily, telmisartan 80 mg daily (  finishing up supply of 40 mg tablets by taking 2), amlodipine 10 mg - though reads off that she is taking a bottle of 5 mg; metoprolol succinate 50 mg QPM, requests refill on this today . Vertigo related nausea: ondansetron 4 mg PRN; requests refill today, though notes she has not required recently.  . Current home readings: has a wrist cuff at home, but has not checked recently . Denies hypertensive symptoms . Discussed benefits of arm cuff over wrist. She notes that she doesn't think she has OTC benefits. She received an OTC benefits book from her insurance once, but when she tried to order OTC products she was told her plan did not have the benefit.  . Lab work next week s/p ARB dose increase. Scheduled nurse visit concurrently for BP check.  . Patient overwhelmed by the number of calls she received from CCM team in the past week. Will defer addition of RN CM or Care Guide referral for support in investigating insurance resources at this time, but will discuss moving forward.   . Continue current regimen at this time. Chart updated to reflect current amlodipine dose. Refill sent on metoprolol. BP visit next week. Recommend increasing amlodipine to 10 mg if BP still above goal.    Hyperlipidemia and ASCVD risk reduction: . Uncontrolled related to non adherence; current treatment: atorvastatin 40 mg daily - just restarted  . Antiplatelet regimen: aspirin 81 mg daily  . Continue current regimen at this time  Chronic Obstructive Pulmonary Disease: [name] Uncontrolled; current treatment: albuterol HFA PRN, albuterol nebulizer PRN . Will evaluate appropriate therapy and affordable options moving forward, pending Medicare Extra Help determination . Continue current regimen at this time  Depression/Anxiety . Controlled; current regimen: paroxetine 10 mg QAM, prescribed bupropion SR 150 mg BID for concurrent depression and tobacco cessation but denies benefit with tobacco cessation. Has been off bupropion therapy for a while, it was last prescribed in 2020.  [name] Decided not to pursue restarting bupropion therapy at this time as mood is controlled and we decided to pursue varenicline as below.   Tobacco Abuse: . 0.5 packs per day;  . Previous quit attempts: unsuccessful using varenicline, but she doesn't remember how long she took this for or if she took consistently . Triggers to smoke: stress . Motivation to quit smoking: health . Discussed that evidence best supports use of varenicline therapy, with or without nicotine replacement therapy, for tobacco cessation success. Unsure if patient used varenicline in the most effective way possible. Given financial concerns and lack of knowledge regarding OTC benefits, will defer use of nicotine replacement therapy at this time.  [name] Restart varenicline. Take 0.5 mg once daily for 3 days, then 0.5 mg BID for 4 days, then 1 mg BID thereafter. Take with food. Set a quit date ideally within the first month. Continue varenicline therapy for at least 3-6 months after cessation.  . Consider calling [place] [facility] for CBT support   Patient Goals/Self-Care Activities . Over the next 90 days, patient will:  - take medications as prescribed check  blood pressure periodically, document, and provide at future appointments collaborate with provider on medication access solutions  Follow Up Plan: Telephone follow up appointment with care management team member scheduled for: ~ 4 weeks      Medication Assistance: Medicare Extra Help application completed today  Patient's preferred pharmacy is:  [facility] [place], [place]  [place] [address] Phone: [phone] Fax: [phone]  Uses pill box? No Pt endorses 90% compliance  Follow Up:  Patient agrees to Care Plan and Follow-up.  Plan: Telephone follow up appointment with care management team member scheduled for:  ~ 4 weeks  Catie Darnelle Maffucci, PharmD, [place], [place] Clinical Pharmacist [employer] at [employer] [phone]

## 2020-11-20 ENCOUNTER — Other Ambulatory Visit: Payer: Self-pay | Admitting: Internal Medicine

## 2020-11-20 DIAGNOSIS — I10 Essential (primary) hypertension: Secondary | ICD-10-CM

## 2020-11-24 ENCOUNTER — Other Ambulatory Visit: Payer: Self-pay | Admitting: Internal Medicine

## 2020-11-24 ENCOUNTER — Telehealth: Payer: Self-pay | Admitting: Internal Medicine

## 2020-11-24 DIAGNOSIS — F17210 Nicotine dependence, cigarettes, uncomplicated: Secondary | ICD-10-CM | POA: Diagnosis not present

## 2020-11-24 DIAGNOSIS — H8109 Meniere's disease, unspecified ear: Secondary | ICD-10-CM | POA: Diagnosis not present

## 2020-11-24 DIAGNOSIS — E785 Hyperlipidemia, unspecified: Secondary | ICD-10-CM | POA: Diagnosis not present

## 2020-11-24 DIAGNOSIS — E119 Type 2 diabetes mellitus without complications: Secondary | ICD-10-CM | POA: Diagnosis not present

## 2020-11-24 DIAGNOSIS — I701 Atherosclerosis of renal artery: Secondary | ICD-10-CM | POA: Diagnosis not present

## 2020-11-24 DIAGNOSIS — Z95820 Peripheral vascular angioplasty status with implants and grafts: Secondary | ICD-10-CM | POA: Diagnosis not present

## 2020-11-24 DIAGNOSIS — Z7982 Long term (current) use of aspirin: Secondary | ICD-10-CM | POA: Diagnosis not present

## 2020-11-24 DIAGNOSIS — Z79899 Other long term (current) drug therapy: Secondary | ICD-10-CM | POA: Diagnosis not present

## 2020-11-24 DIAGNOSIS — I10 Essential (primary) hypertension: Secondary | ICD-10-CM | POA: Diagnosis not present

## 2020-11-24 NOTE — Telephone Encounter (Signed)
Dr.Ginigle called from [facility] stated that she is planning  to do a left renal stent contact number is [phone]

## 2020-11-26 ENCOUNTER — Other Ambulatory Visit: Payer: Self-pay

## 2020-11-26 ENCOUNTER — Other Ambulatory Visit (INDEPENDENT_AMBULATORY_CARE_PROVIDER_SITE_OTHER): Payer: Medicare Other

## 2020-11-26 ENCOUNTER — Ambulatory Visit (INDEPENDENT_AMBULATORY_CARE_PROVIDER_SITE_OTHER): Payer: Medicare Other

## 2020-11-26 DIAGNOSIS — R944 Abnormal results of kidney function studies: Secondary | ICD-10-CM | POA: Diagnosis not present

## 2020-11-26 DIAGNOSIS — I150 Renovascular hypertension: Secondary | ICD-10-CM

## 2020-11-26 DIAGNOSIS — I10 Essential (primary) hypertension: Secondary | ICD-10-CM

## 2020-11-26 LAB — BASIC METABOLIC PANEL
BUN: 26 mg/dL — ABNORMAL HIGH (ref 6–23)
CO2: 28 mEq/L (ref 19–32)
Calcium: 9 mg/dL (ref 8.4–10.5)
Chloride: 105 mEq/L (ref 96–112)
Creatinine, Ser: 1.61 mg/dL — ABNORMAL HIGH (ref 0.40–1.20)
GFR: 33.35 mL/min — ABNORMAL LOW (ref >60.00)
Glucose, Bld: 103 mg/dL — ABNORMAL HIGH (ref 70–99)
Potassium: 3.9 mEq/L (ref 3.5–5.1)
Sodium: 140 mEq/L (ref 135–145)

## 2020-11-26 LAB — RENAL FUNCTION PANEL
Albumin: 4 g/dL (ref 3.5–5.2)
BUN: 26 mg/dL — ABNORMAL HIGH (ref 6–23)
CO2: 28 mEq/L (ref 19–32)
Calcium: 9 mg/dL (ref 8.4–10.5)
Chloride: 105 mEq/L (ref 96–112)
Creatinine, Ser: 1.61 mg/dL — ABNORMAL HIGH (ref 0.40–1.20)
GFR: 33.35 mL/min — ABNORMAL LOW (ref >60.00)
Glucose, Bld: 103 mg/dL — ABNORMAL HIGH (ref 70–99)
Phosphorus: 3.5 mg/dL (ref 2.3–4.6)
Potassium: 3.9 mEq/L (ref 3.5–5.1)
Sodium: 140 mEq/L (ref 135–145)

## 2020-11-26 NOTE — Progress Notes (Addendum)
Patient is here for a BP check due to bp being high at last visit, as per patient.  Currently patients BP is 125/71 and BPM is 71.  Patient has no complaints of headaches, blurry vision, chest pain, arm pain, light headedness, dizziness, and nor jaw pain. Please see previous note for order.

## 2020-11-26 NOTE — Telephone Encounter (Signed)
Spoke with [facility] and the gentleman at the front desk stated that the doctor is not back in clinic until Tuesday. I let him know that Dr. Darrick Huntsman would not be back in the office until Monday. He took a message and sent it to the providers PA and stated that she would call or the provider would call back on Monday or Tuesday.

## 2020-11-29 DIAGNOSIS — I129 Hypertensive chronic kidney disease with stage 1 through stage 4 chronic kidney disease, or unspecified chronic kidney disease: Secondary | ICD-10-CM | POA: Insufficient documentation

## 2020-11-29 DIAGNOSIS — I10 Essential (primary) hypertension: Secondary | ICD-10-CM | POA: Insufficient documentation

## 2020-11-29 NOTE — Assessment & Plan Note (Signed)
Patient presented  for a BP check due to bp being high at last visit, as per patient.   BP was  125/71 and BPM is 71.

## 2020-12-02 ENCOUNTER — Telehealth: Payer: Self-pay

## 2020-12-02 MED ORDER — TELMISARTAN 80 MG PO TABS
80.0000 mg | ORAL_TABLET | Freq: Every day | ORAL | 1 refills | Status: DC
Start: 2020-12-02 — End: 2021-01-13

## 2020-12-02 NOTE — Telephone Encounter (Signed)
Pt called because she was confused about her blood pressure medication. She stated that she was told not to take the losartan anymore and take the telmisartan but that the [facility] on [address] fillde the losartan. Explained to pt that she is not supposed to be taking the losartan anymore and not to pick up that rx. Pt gave a verbal understanding. Pt also [facility] that [facility] on [address] for her chart. Pharmacy has been removed. Telmisartan 80 mg has been refilled as well.

## 2020-12-22 ENCOUNTER — Ambulatory Visit (INDEPENDENT_AMBULATORY_CARE_PROVIDER_SITE_OTHER): Payer: Medicare Other | Admitting: Pharmacist

## 2020-12-22 DIAGNOSIS — I150 Renovascular hypertension: Secondary | ICD-10-CM

## 2020-12-22 DIAGNOSIS — E782 Mixed hyperlipidemia: Secondary | ICD-10-CM

## 2020-12-22 DIAGNOSIS — I10 Essential (primary) hypertension: Secondary | ICD-10-CM | POA: Diagnosis not present

## 2020-12-22 DIAGNOSIS — H8109 Meniere's disease, unspecified ear: Secondary | ICD-10-CM

## 2020-12-22 DIAGNOSIS — Z720 Tobacco use: Secondary | ICD-10-CM

## 2020-12-22 NOTE — Patient Instructions (Signed)
Jaelle,   It was great talking to you today!  Bring me a copy of the letter from the [employer], whenever you have a chance.   The "[employer]", or [place] [facility], is a group that can offer assistance in evaluating insurance options. Their main line is [phone] (M-F [id number]), but they also have counselors physically at the [facility] [place], [place], [place] [address]). Their number is [phone]. I am unsure if you have to have an appointment or if they have walk in hours, but I would give them a call to discuss. They have been a very helpful resource for several of my patients in the past. Take that letter about the Extra Help with you when you see them.   Call me with any questions or concerns in the meantime!  Catie Feliz Beam, PharmD [phone]  Visit Information  PATIENT GOALS: Goals Addressed              This Visit's Progress     Patient Stated   .  Medication Management (pt-stated)        Patient Goals/Self-Care Activities . Over the next 90 days, patient will:  - take medications as prescribed check blood pressure periodically, document, and provide at future appointments collaborate with provider on medication access solutions        The patient verbalized understanding of instructions, educational materials, and care plan provided today and agreed to receive a mailed copy of patient instructions, educational materials, and care plan.  Plan: Telephone follow up appointment with care management team member scheduled for:  ~ 6 weeks  Catie Feliz Beam, PharmD, [place], CPP Clinical Pharmacist [employer] at [employer] [phone]

## 2020-12-22 NOTE — Chronic Care Management (AMB) (Signed)
Chronic Care Management Pharmacy Note  12/22/2020 Name:  Patricia Fields MRN:  [id number] DOB:  07-22-1956  Subjective: Patricia Fields is an 65 y.o. year old female who is a primary patient of Derrel Nip, Aris Everts, MD.  The CCM team was consulted for assistance with disease management and care coordination needs.    Engaged with patient by telephone for follow up visit in response to provider referral for pharmacy case management and/or care coordination services.   Consent to Services:  The patient was given information about Chronic Care Management services, agreed to services, and gave verbal consent prior to initiation of services.  Please see initial visit note for detailed documentation.   Patient Care Team: Crecencio Mc, MD as PCP - General (Internal Medicine) De Hollingshead, RPH-CPP (Pharmacist)  Recent office visits:  4/7 - Nurse visit for BP check;  BP improved to 125/71  Recent consult visits:  4/5 - f/u [facility] Vascular surgery - worsening L renal artery stenosis - procedure scheduled 5/4   Hospital visits: None in previous 6 months  Objective:  Lab Results  Component Value Date   CREATININE 1.61 (H) 11/26/2020   CREATININE 1.61 (H) 11/26/2020   CREATININE 1.21 (H) 10/30/2020    Lab Results  Component Value Date   HGBA1C 6.1 10/16/2020   Last diabetic Eye exam:  Lab Results  Component Value Date/Time   HMDIABEYEEXA No Retinopathy 10/30/2018 12:00 AM    Last diabetic Foot exam: No results found for: HMDIABFOOTEX      Component Value Date/Time   CHOL 213 (H) 10/16/2020 0853   TRIG 153.0 (H) 10/16/2020 0853   HDL 42.70 10/16/2020 0853   CHOLHDL 5 10/16/2020 0853   VLDL 30.6 10/16/2020 0853   LDLCALC 139 (H) 10/16/2020 0853   LDLDIRECT 95.0 01/17/2020 1125    Hepatic Function Latest Ref Rng & Units 11/26/2020 10/16/2020 01/17/2020  Total Protein 6.0 - 8.3 g/dL - 6.8 6.6  Albumin 3.5 - 5.2 g/dL 4.0 4.0 4.1  AST 0 - 37 U/L - 10 13  ALT 0 - 35 U/L - 11  15  Alk Phosphatase 39 - 117 U/L - 96 115  Total Bilirubin 0.2 - 1.2 mg/dL - 0.4 0.4    Lab Results  Component Value Date/Time   TSH 0.88 11/29/2017 03:03 PM   TSH 1.26 08/14/2014 09:34 AM    CBC Latest Ref Rng & Units 03/12/2018 11/29/2017 08/14/2014  WBC 4.0 - 10.5 K/uL 8.1 15.2(H) 8.5  Hemoglobin 12.0 - 15.0 g/dL 13.5 13.2 14.6  Hematocrit 36.0 - 46.0 % 39.5 38.9 43.1  Platelets 150.0 - 400.0 K/uL 240.0 279.0 293.0    Lab Results  Component Value Date/Time   VD25OH 27.47 (L) 11/06/2015 10:05 AM   VD25OH 19.94 (L) 08/14/2014 09:34 AM    Clinical ASCVD: Yes  The 10-year ASCVD risk score Mikey Bussing DC Jr., et al., 2013) is: 29.1%   Values used to calculate the score:     Age: 17 years     Sex: Female     Is Non-Hispanic African American: No     Diabetic: Yes     Tobacco smoker: Yes     Systolic Blood Pressure: 149 mmHg     Is BP treated: Yes     HDL Cholesterol: 42.7 mg/dL     Total Cholesterol: 213 mg/dL     Social History   Tobacco Use  Smoking Status Current Every Day Smoker  . Packs/day: 0.50  . Types:  Cigarettes  Smokeless Tobacco Never Used   BP Readings from Last 3 Encounters:  10/30/20 (!) 148/78  10/16/20 (!) 180/92  03/18/20 (!) 189/79   Pulse Readings from Last 3 Encounters:  10/30/20 64  10/16/20 87  02/14/20 74   Wt Readings from Last 3 Encounters:  10/16/20 155 lb (70.3 kg)  03/18/20 173 lb (78.5 kg)  02/14/20 173 lb (78.5 kg)    Assessment: Review of patient past medical history, allergies, medications, health status, including review of consultants reports, laboratory and other test data, was performed as part of comprehensive evaluation and provision of chronic care management services.   SDOH:  (Social Determinants of Health) assessments and interventions performed:  SDOH Interventions   Flowsheet Row Most Recent Value  SDOH Interventions   SDOH Interventions for the Following Domains Tobacco  Financial Strain Interventions Other  (Comment)  [information given regarding [place] [employer]IIP]  Tobacco Interventions Cessation Materials Given and Reviewed      CCM Care Plan  Allergies  Allergen Reactions  . Bee Pollen Anaphylaxis  . Bee Venom Shortness Of Breath  . Tomato Hives    Medications Reviewed Today    Reviewed by De Hollingshead, RPH-CPP (Pharmacist) on 12/22/20 at [phone]  Med List Status: <None>  Medication Order Taking? Sig Documenting Provider Last Dose Status Informant  albuterol (PROVENTIL) (2.5 MG/3ML) 0.083% nebulizer solution [id number]  Take 3 mLs (2.5 mg total) by nebulization every 8 (eight) hours as needed for wheezing or shortness of breath. Burnard Hawthorne, FNP  Active   Albuterol Sulfate (PROAIR RESPICLICK) 518 (90 Base) MCG/ACT AEPB [id number]  Inhale 1 puff into the lungs every 8 (eight) hours as needed. For wheezing Crecencio Mc, MD  Active   amLODipine (NORVASC) 5 MG tablet [id number] Yes TAKE 1 TABLET BY MOUTH EVERY DAY Crecencio Mc, MD Taking Active   aspirin EC 81 MG tablet [id number] Yes Take 1 tablet (81 mg total) by mouth daily. Crecencio Mc, MD Taking Active   atorvastatin (LIPITOR) 40 MG tablet [id number] Yes Take 1 tablet (40 mg total) by mouth daily. Crecencio Mc, MD Taking Active   betamethasone acetate-betamethasone sodium phosphate (CELESTONE) injection 3 mg [id number]   Edrick Kins, DPM  Active   blood glucose meter kit and supplies [id number] Yes Use to check blood sugars once daily.  (FOR ICD-10 E10.9, E11.9). Crecencio Mc, MD Taking Active   Cholecalciferol (VITAMIN D3) 1000 UNITS CAPS [id number]  Take by mouth.  Patient not taking: Reported on 11/19/2020   [provider]  Active   ipratropium-albuterol (DUONEB) 0.5-2.5 (3) MG/3ML nebulizer solution 3 mL [id number]   Burnard Hawthorne, FNP  Active   Lancets (ONETOUCH DELICA PLUS HWEXHB71I) [place] [id number]  USE WITH DEVICE TO TEST  ONCE DAILY Crecencio Mc, MD  Active   metoprolol succinate (TOPROL-XL) 50 MG 24  hr tablet [id number] Yes TAKE 1 TABLET BY MOUTH EVERY DAY WITH OR IMMEDIATELY FOLLOWING A MEAL Crecencio Mc, MD Taking Active   omega-3 acid ethyl esters (LOVAZA) 1 g capsule [id number] Yes Take 2 capsules by mouth 2 (two) times daily. [provider] Taking Active   ondansetron (ZOFRAN ODT) 4 MG disintegrating tablet [id number]  Take 1 tablet (4 mg total) by mouth every 8 (eight) hours as needed for nausea or vomiting.  Patient not taking: Reported on 11/19/2020   Crecencio Mc, MD  Active   [facility] ULTRA test strip [id number]  USE WITH METER TO CHECK  BLOOD SUGAR ONCE DAILY. Crecencio Mc, MD  Active   PARoxetine (PAXIL) 10 MG tablet [id number] Yes TAKE 1 TABLET BY MOUTH ONCE DAILY IN THE MORNING Crecencio Mc, MD Taking Active   telmisartan (MICARDIS) 80 MG tablet [id number] Yes Take 1 tablet (80 mg total) by mouth daily. Crecencio Mc, MD Taking Active   triamterene-hydrochlorothiazide (DYAZIDE) 37.5-25 MG capsule [id number] Yes Take 1 each (1 capsule total) by mouth daily. Crecencio Mc, MD Taking Active   varenicline (CHANTIX) 1 MG tablet [id number] No Take 1/2 tablet by mouth daily for 3 days, then 1/2 tablet by mouth twice daily for 4 days, then increase to 1 tablet twice daily thereafter.  Patient not taking: Reported on 12/22/2020   Crecencio Mc, MD Not Taking Active           Patient Active Problem List   Diagnosis Date Noted  . Essential hypertension 11/29/2020  . Meniere's disease 04/28/2020  . Breast calcifications on mammogram 10/25/2018  . Elevated alkaline phosphatase level 10/17/2018  . Diabetic nephropathy with proteinuria ([place]) 10/17/2018  . Headache 07/14/2018  . Decreased GFR 03/14/2018  . Preoperative clearance 11/30/2017  . DM (diabetes mellitus), type 2 with peripheral vascular complications ([place]) 08/14/8249  . Breast cancer screening 10/15/2016  . Bronchitis 07/19/2016  . Cataract of both eyes 12/31/2014  . Long-term use of high-risk medication  08/19/2014  . Hypovitaminosis D 08/18/2014  . Encounter for preventive health examination 08/16/2014  . Pain in joint, shoulder region 08/16/2014  . Tobacco abuse counseling 08/16/2014  . Gait instability 04/03/2014  . 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 Hearing loss, 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/2013  . History of shingles 08/30/2013  . Generalized anxiety disorder 05/29/2013  . Obesity (BMI 30.0-34.9) 03/19/2012  . Screening for colon cancer 03/19/2012  . History of nephrolithiasis   . Interstitial cystitis   . COPD exacerbation ([place]) 10/16/2011  . Renovascular hypertension   . Hyperlipidemia   . 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 Tobacco abuse 07/25/2011    Immunization History  Administered Date(s) Administered  . Fluad Quad(high Dose 65+) 10/16/2020  . Influenza Split 07/25/2011, 09/27/2012  . Influenza,inj,Quad PF,6+ Mos 05/29/2013, 08/14/2014, 11/06/2015, 09/19/2016, 07/12/2018  . PFIZER(Purple Top)SARS-COV-2 Vaccination 11/29/2019, 12/20/2019  . Pneumococcal Conjugate-13 05/29/2013  . Pneumococcal Polysaccharide-23 08/14/2014  . Tdap 07/25/2011  . Zoster 08/20/2014    Conditions to be addressed/monitored: HTN, HLD, COPD and Depression  Care Plan : PharmD - Medication Management  Updates made by De Hollingshead, RPH-CPP since 12/22/2020 12:00 AM    Problem: Hypertension, Vestibular Neuronitis, Tobacco Abuse     Long-Range Goal: Disease Progression Prevention   Start Date: 11/19/2020  This Visit's Progress: On track  Recent Progress: On track  Priority: High  Note:   Current Barriers:  . Unable to independently afford treatment regimen . Unable to achieve control of tobacco use   Pharmacist Clinical Goal(s):  [name] Over the next 90 days, patient will achieve adherence to monitoring guidelines and medication adherence to achieve therapeutic efficacy through collaboration with PharmD and provider.    Interventions: . 1:1 collaboration with Crecencio Mc, MD regarding development and update of comprehensive plan of care as evidenced by provider attestation and co-signature . Inter-disciplinary care team collaboration (see longitudinal plan of care) . Comprehensive medication review performed; medication list updated in electronic medical record  SDOH: . Received a letter from [facility] that she "already has Medicare Extra Help". Unsure if  it is talking about Full Extra Help or Partial. Patient will bring me a copy of this to review.  . Patient also asks if someone can help her navigate choosing the best health insurance plan. Will mail her information about [place] [employer]IIP   Hypertension/Vestibular Disease: . At goal at last BP check; current treatment: triamterene/HCTZ 37.5/25 mg daily, telmisartan 80 mg daily, amlodipine 5 mg,  metoprolol succinate 50 mg QPM, requests refill on this today . Vertigo related nausea: ondansetron 4 mg PRN; . Current home readings: has not checked recently due to stress about upcoming vascular procedure . Denies hypertensive symptoms . Continue current regimen at this time. Encouraged occasional home BP checks.   Hyperlipidemia and ASCVD risk reduction: . Uncontrolled related to non adherence; current treatment: atorvastatin 40 mg daily - restarted in March; omega 3 fatty acids, 2 g BID . Antiplatelet regimen: aspirin 81 mg daily  . Continue current regimen at this time. Follow up lipid panel with next PCP appointment   Chronic Obstructive Pulmonary Disease: [name] Uncontrolled; current treatment: albuterol HFA PRN, albuterol nebulizer PRN . Will evaluate appropriate therapy and affordable options moving forward, pending Medicare Extra Help determination. Will see what the documentation from [facility] says. . Continue current regimen at this time  Depression/Anxiety . Controlled; current regimen: paroxetine 10 mg QAM . Notes some stress related to upcoming vascular  procedure.  . Continue current regimen at this time.  Tobacco Abuse: . 0.5 packs per day; planned to start varenicline at our last appointment, but she notes [facility] did not fill it . Previous quit attempts: unsuccessful using varenicline, but she doesn't remember how long she took this for or if she took consistently . Triggers to smoke: stress . Motivation to quit smoking: health . Recommend to start varenicline after upcoming procedure. Take 0.5 mg once daily for 3 days, then 0.5 mg BID for 4 days, then 1 mg BID thereafter. Take with food. Set a quit date ideally within the first month. Continue varenicline therapy for at least 3-6 months after cessation.  . Consider calling [place] [facility] for CBT support.   Patient Goals/Self-Care Activities . Over the next 90 days, patient will:  - take medications as prescribed check blood pressure periodically, document, and provide at future appointments collaborate with provider on medication access solutions  Follow Up Plan: Telephone follow up appointment with care management team member scheduled for: ~ 6 weeks      Medication Assistance: None required.  Patient affirms current coverage meets needs.  Patient's preferred pharmacy is:  [id number] [address], [place] [address] Phone: [phone] Fax: [phone]   Follow Up:  Patient agrees to Care Plan and Follow-up.  Plan: Telephone follow up appointment with care management team member scheduled for:  ~ 6 weeks  Catie Darnelle Maffucci, PharmD, [place], [place] Clinical Pharmacist [employer] [facility] [employer] [phone]

## 2020-12-23 DIAGNOSIS — I771 Stricture of artery: Secondary | ICD-10-CM | POA: Diagnosis not present

## 2020-12-23 DIAGNOSIS — I701 Atherosclerosis of renal artery: Secondary | ICD-10-CM | POA: Diagnosis not present

## 2020-12-23 DIAGNOSIS — Z7982 Long term (current) use of aspirin: Secondary | ICD-10-CM | POA: Diagnosis not present

## 2020-12-23 DIAGNOSIS — Z79899 Other long term (current) drug therapy: Secondary | ICD-10-CM | POA: Diagnosis not present

## 2021-01-01 ENCOUNTER — Telehealth: Payer: Self-pay | Admitting: Internal Medicine

## 2021-01-01 NOTE — Telephone Encounter (Signed)
Patient stated that Plavix was added after surgery to place stent in her renal artery, that she has felt tired and fatigued advised patient since the Vascular surgeon is the doctor that placed her on this medication and placed stent she should call them and make them aware of how she is feeling. I advised her any worsening symptoms she needs to be evaluated.

## 2021-01-01 NOTE — Telephone Encounter (Signed)
Patient called in wanted a call back she had surgery last week and now after she takes her medication in the afternoon she feel drained just want to sleep

## 2021-01-01 NOTE — Telephone Encounter (Signed)
I agree with the need for the patient to contact the vascular surgeon regarding this.

## 2021-01-13 ENCOUNTER — Encounter: Payer: Self-pay | Admitting: Internal Medicine

## 2021-01-13 ENCOUNTER — Other Ambulatory Visit: Payer: Self-pay

## 2021-01-13 ENCOUNTER — Ambulatory Visit (INDEPENDENT_AMBULATORY_CARE_PROVIDER_SITE_OTHER): Payer: Medicare Other | Admitting: Internal Medicine

## 2021-01-13 VITALS — BP 140/70 | HR 67 | Temp 96.7°F | Resp 14 | Ht 58.5 in | Wt 149.0 lb

## 2021-01-13 DIAGNOSIS — I129 Hypertensive chronic kidney disease with stage 1 through stage 4 chronic kidney disease, or unspecified chronic kidney disease: Secondary | ICD-10-CM

## 2021-01-13 DIAGNOSIS — R748 Abnormal levels of other serum enzymes: Secondary | ICD-10-CM | POA: Diagnosis not present

## 2021-01-13 DIAGNOSIS — R944 Abnormal results of kidney function studies: Secondary | ICD-10-CM | POA: Diagnosis not present

## 2021-01-13 DIAGNOSIS — Z1231 Encounter for screening mammogram for malignant neoplasm of breast: Secondary | ICD-10-CM

## 2021-01-13 DIAGNOSIS — E1121 Type 2 diabetes mellitus with diabetic nephropathy: Secondary | ICD-10-CM

## 2021-01-13 DIAGNOSIS — R921 Mammographic calcification found on diagnostic imaging of breast: Secondary | ICD-10-CM | POA: Diagnosis not present

## 2021-01-13 DIAGNOSIS — I739 Peripheral vascular disease, unspecified: Secondary | ICD-10-CM

## 2021-01-13 DIAGNOSIS — E1122 Type 2 diabetes mellitus with diabetic chronic kidney disease: Secondary | ICD-10-CM

## 2021-01-13 DIAGNOSIS — E1151 Type 2 diabetes mellitus with diabetic peripheral angiopathy without gangrene: Secondary | ICD-10-CM

## 2021-01-13 DIAGNOSIS — Z716 Tobacco abuse counseling: Secondary | ICD-10-CM

## 2021-01-13 LAB — LIPID PANEL
Cholesterol: 151 mg/dL (ref 0–200)
HDL: 41.4 mg/dL (ref >39.00)
LDL Cholesterol: 83 mg/dL (ref 0–99)
NonHDL: 109.96
Total CHOL/HDL Ratio: 4
Triglycerides: 136 mg/dL (ref 0.0–149.0)
VLDL: 27.2 mg/dL (ref 0.0–40.0)

## 2021-01-13 LAB — RENAL FUNCTION PANEL
Albumin: 4 g/dL (ref 3.5–5.2)
BUN: 26 mg/dL — ABNORMAL HIGH (ref 6–23)
CO2: 27 mEq/L (ref 19–32)
Calcium: 9.3 mg/dL (ref 8.4–10.5)
Chloride: 106 mEq/L (ref 96–112)
Creatinine, Ser: 1.25 mg/dL — ABNORMAL HIGH (ref 0.40–1.20)
GFR: 45.14 mL/min — ABNORMAL LOW (ref >60.00)
Glucose, Bld: 103 mg/dL — ABNORMAL HIGH (ref 70–99)
Phosphorus: 4 mg/dL (ref 2.3–4.6)
Potassium: 4.3 mEq/L (ref 3.5–5.1)
Sodium: 141 mEq/L (ref 135–145)

## 2021-01-13 LAB — HEMOGLOBIN A1C: Hgb A1c MFr Bld: 6.5 % (ref 4.6–6.5)

## 2021-01-13 MED ORDER — TELMISARTAN 20 MG PO TABS: 20.0000 mg | ORAL_TABLET | Freq: Every day | ORAL | 0 refills | Status: DC

## 2021-01-13 MED ORDER — ZOSTER VAC RECOMB ADJUVANTED 50 MCG/0.5ML IM SUSR: 0.5000 mL | Freq: Once | INTRAMUSCULAR | 1 refills | Status: AC

## 2021-01-13 NOTE — Patient Instructions (Addendum)
Your blood pressure medications need to be reduced because your blood pressure is lower since you had your procedure (this was the goal) I recommend that you:  1) Cut your metoprolol in half and continue taking 1/2 TABLET at night  2) TAKE NO TELMISARTAN TODAY.  DOSE HAS BEEN REDUCED TO 20 MG . TAKE THE NEWS DOSE TONIGHT  4)  NO MORE TRIAMTERENE HCTZ AND NO MORE AMLODIPINE  .     MEASURE YOUR BLOOD PRESSURE TOMORROW MORNING WHEN YOU WAKE UP AND SEND ME A MESSAGE    KATHY DAVIS WILL CALL YOU NEXT MONTH TO SET UP YOUR EYE EXAM AT THE OFFICE   PLEASE CALL [facility] AND SET UP YOUR FOLLOW UP APPT (June) .  PLEASE LET THEM KNOW THAT YOUR FEET ARE CRAMPING WITH ACTIVITY SO YOUR CIRCULATION CAN BE CHECKED

## 2021-01-13 NOTE — Progress Notes (Signed)
Subjective:  Patient ID: Patricia Fields, female    DOB: [birth year]  Age: 65 y.o. MRN: [id number]  CC: The primary encounter diagnosis was Decreased GFR. Diagnoses of Elevated alkaline phosphatase level, DM (diabetes mellitus), type 2 with peripheral vascular complications ([place]), Breast calcifications on mammogram, Diabetic nephropathy with proteinuria ([place]), Renal hypertension, Tobacco abuse counseling, Encounter for screening mammogram for malignant neoplasm of breast, and Intermittent claudication ([place]) were also pertinent to this visit.  HPI CHARLYE SPARE presents for follow up on type  DM  , PAD and COPD  This visit occurred during the SARS-CoV-2 public health emergency.  Safety protocols were in place, including screening questions prior to the visit, additional usage of staff PPE, and extensive cleaning of exam room while observing appropriate contact time as indicated for disinfecting solutions.   She underwent  RAS procedure with stenting of Left RA on May 4 at [facility]:  Vascular stopped her use of amlodipine May 4 .  Still taking telmisartan 80 mg,  metoprolol 50 mg and maxzide.  Has not taken any this morning, (took metoprolol yesterday evening per usual).  Has not been checking BP at home.  States that  For the last 1-2 weeks she has repeatedly felt fine in the  morning  And then feels "washed out " the rest of the day.  Does not have the recommended one month follow up with vascular for on or around June 4 because she was unaware she was supposed to follow up    Feet are cramping during the day. Occurs both at rest and with ambulation . [name]  Not exercising    Outpatient Medications Prior to Visit  Medication Sig Dispense Refill  . albuterol (PROVENTIL) (2.5 MG/3ML) 0.083% nebulizer solution Take 3 mLs (2.5 mg total) by nebulization every 8 (eight) hours as needed for wheezing or shortness of breath. 150 mL 1  . Albuterol Sulfate (PROAIR RESPICLICK) 937 (90 Base) MCG/ACT AEPB Inhale 1 puff  into the lungs every 8 (eight) hours as needed. For wheezing 3 each 0  . aspirin EC 81 MG tablet Take 1 tablet (81 mg total) by mouth daily. 90 tablet 2  . atorvastatin (LIPITOR) 40 MG tablet Take 1 tablet (40 mg total) by mouth daily. 90 tablet 3  . blood glucose meter kit and supplies Use to check blood sugars once daily.  (FOR ICD-10 E10.9, E11.9). 1 each 0  . clopidogrel (PLAVIX) 75 MG tablet Take 1 tablet by mouth daily.    . Lancets (ONETOUCH DELICA PLUS [id number]) MISC USE WITH DEVICE TO TEST  ONCE DAILY 100 each 3  . metoprolol succinate (TOPROL-XL) 50 MG 24 hr tablet TAKE 1 TABLET BY MOUTH EVERY DAY WITH OR IMMEDIATELY FOLLOWING A MEAL 90 tablet 3  . omega-3 acid ethyl esters (LOVAZA) 1 g capsule Take 2 capsules by mouth 2 (two) times daily.    . ondansetron (ZOFRAN ODT) 4 MG disintegrating tablet Take 1 tablet (4 mg total) by mouth every 8 (eight) hours as needed for nausea or vomiting. 60 tablet 2  . ONETOUCH ULTRA test strip USE WITH METER TO CHECK  BLOOD SUGAR ONCE DAILY. 100 strip 3  . PARoxetine (PAXIL) 10 MG tablet TAKE 1 TABLET BY MOUTH ONCE DAILY IN THE MORNING 90 tablet 1  . triamterene-hydrochlorothiazide (DYAZIDE) 37.5-25 MG capsule Take 1 each (1 capsule total) by mouth daily. 90 capsule 1  . telmisartan (MICARDIS) 80 MG tablet Take 1 tablet (80 mg total) by mouth daily. [place]  tablet 1  . varenicline (CHANTIX) 1 MG tablet Take 1/2 tablet by mouth daily for 3 days, then 1/2 tablet by mouth twice daily for 4 days, then increase to 1 tablet twice daily thereafter. (Patient not taking: No sig reported) 56 tablet 5  . amLODipine (NORVASC) 5 MG tablet TAKE 1 TABLET BY MOUTH EVERY DAY (Patient not taking: Reported on 01/13/2021) 90 tablet 1  . Cholecalciferol (VITAMIN D3) 1000 UNITS CAPS Take by mouth. (Patient not taking: No sig reported)     Facility-Administered Medications Prior to Visit  Medication Dose Route Frequency Provider Last Rate Last Admin  . betamethasone  acetate-betamethasone sodium phosphate (CELESTONE) injection 3 mg  3 mg Intramuscular Once Evans, Brent M, DPM      . ipratropium-albuterol (DUONEB) 0.5-2.5 (3) MG/3ML nebulizer solution 3 mL  3 mL Nebulization Q6H Burnard Hawthorne, FNP        Review of Systems;  Patient denies headache, fevers, malaise, unintentional weight loss, skin rash, eye pain, sinus congestion and sinus pain, sore throat, dysphagia,  hemoptysis , cough, dyspnea, wheezing, chest pain, palpitations, orthopnea, edema, abdominal pain, nausea, melena, diarrhea, constipation, flank pain, dysuria, hematuria, urinary  Frequency, nocturia, numbness, tingling, seizures,  Focal weakness, Loss of consciousness,  Tremor, insomnia, depression, anxiety, and suicidal ideation.      Objective:  BP 140/70 (BP Location: Left Arm, Patient Position: Sitting, Cuff Size: Normal)   Pulse 67   Temp (!) 96.7 F (35.9 C) (Temporal)   Resp 14   Ht 4' 10.5" (1.486 m)   Wt 149 lb (67.6 kg)   SpO2 96%   BMI 30.61 kg/m   BP Readings from Last 3 Encounters:  01/13/21 140/70  10/30/20 (!) 148/78  10/16/20 (!) 180/92    Wt Readings from Last 3 Encounters:  01/13/21 149 lb (67.6 kg)  10/16/20 155 lb (70.3 kg)  03/18/20 173 lb (78.5 kg)    General appearance: alert, cooperative and appears stated age Ears: normal TM's and external ear canals both ears Throat: lips, mucosa, and tongue normal; teeth and gums normal Neck: no adenopathy, no carotid bruit, supple, symmetrical, trachea midline and thyroid not enlarged, symmetric, no tenderness/mass/nodules Back: symmetric, no curvature. ROM normal. No CVA tenderness. Lungs: clear to auscultation bilaterally Heart: regular rate and rhythm, S1, S2 normal, no murmur, click, rub or gallop Abdomen: soft, non-tender; bowel sounds normal; no masses,  no organomegaly Pulses: 2+ and symmetric Skin: Skin color, texture, turgor normal. No rashes or lesions Lymph nodes: Cervical, supraclavicular, and  axillary nodes normal.  Lab Results  Component Value Date   HGBA1C 6.5 01/13/2021   HGBA1C 6.1 10/16/2020   HGBA1C 6.3 01/17/2020    Lab Results  Component Value Date   CREATININE 1.25 (H) 01/13/2021   CREATININE 1.61 (H) 11/26/2020   CREATININE 1.61 (H) 11/26/2020    Lab Results  Component Value Date   WBC 8.1 03/12/2018   HGB 13.5 03/12/2018   HCT 39.5 03/12/2018   PLT 240.0 03/12/2018   GLUCOSE 103 (H) 01/13/2021   CHOL 151 01/13/2021   TRIG 136.0 01/13/2021   HDL 41.40 01/13/2021   LDLDIRECT 95.0 01/17/2020   LDLCALC 83 01/13/2021   ALT 11 10/16/2020   AST 10 10/16/2020   NA 141 01/13/2021   K 4.3 01/13/2021   CL 106 01/13/2021   CREATININE 1.25 (H) 01/13/2021   BUN 26 (H) 01/13/2021   CO2 27 01/13/2021   TSH 0.88 11/29/2017   HGBA1C 6.5 01/13/2021   MICROALBUR 19.5 (  H) 10/16/2020    MM CLIP PLACEMENT RIGHT  Result Date: 10/23/2019 CLINICAL DATA:  Post stereotactic guided biopsy of calcifications within the upper inner posterior right breast. EXAM: DIAGNOSTIC RIGHT MAMMOGRAM POST STEREOTACTIC BIOPSY COMPARISON:  Previous exam(s). FINDINGS: Mammographic images were obtained following stereotactic guided biopsy of calcifications within the upper inner posterior right breast. A coil shaped biopsy marking clip is present and located approximately 0.9 cm superior to the biopsied calcifications within the upper inner posterior right breast. IMPRESSION: Coil shaped biopsy marking clip located 0.9 cm superior to the biopsied calcifications within the upper inner posterior right breast. Final Assessment: Post Procedure Mammograms for Marker Placement Electronically Signed   By: Everlean Alstrom M.D.   On: 10/23/2019 13:35   MM RT BREAST BX W LOC DEV 1ST LESION IMAGE BX SPEC STEREO GUIDE  Addendum Date: 10/29/2019   ADDENDUM REPORT: 10/28/2019 11:51 ADDENDUM: PATHOLOGY revealed: A. BREAST, RIGHT UPPER INNER QUADRANT, POSTERIOR; STEREOTACTIC CORE BIOPSY: - BENIGN MAMMARY  PARENCHYMA WITH FIBROADENOMATOID CHANGES AND ASSOCIATED COARSE CALCIFICATIONS. - NEGATIVE FOR ATYPICAL PROLIFERATIVE BREAST DISEASE. Pathology results are CONCORDANT with imaging findings, per Dr. Everlean Alstrom. Multiple attempts to contact patient were unsuccessful. Notified patient's provider office, on 10/25/2019, requesting they contact patient with biopsy results. Recommendation: Return to annual bilateral screening mammogram (February 2022). Addendum by Electa Sniff RN on 10/28/2019. Electronically Signed   By: Everlean Alstrom M.D.   On: 10/28/2019 11:51   Result Date: 10/29/2019 CLINICAL DATA:  65 year old female with indeterminate calcifications in the upper inner posterior right breast. EXAM: RIGHT BREAST STEREOTACTIC CORE NEEDLE BIOPSY COMPARISON:  Previous exams. FINDINGS: The patient and I discussed the procedure of stereotactic-guided biopsy including benefits and alternatives. We discussed the high likelihood of a successful procedure. We discussed the risks of the procedure including infection, bleeding, tissue injury, clip migration, and inadequate sampling. Informed written consent was given. The usual time out protocol was performed immediately prior to the procedure. Using sterile technique and 1% Lidocaine as local anesthetic, under stereotactic guidance, a 9 gauge vacuum assisted device was used to perform core needle biopsy of calcifications in the upper inner posterior right breast using a superior to inferior approach. Specimen radiograph was performed showing the presence of calcifications. Specimens with calcifications are identified for pathology. Lesion quadrant: Upper inner At the conclusion of the procedure, coil shaped tissue marker clip was deployed into the biopsy cavity. Follow-up 2-view mammogram was performed and dictated separately. IMPRESSION: Stereotactic-guided biopsy of calcifications in the upper inner posterior right breast. No apparent complications. Electronically Signed:  By: Everlean Alstrom M.D. On: 10/23/2019 13:25    Assessment & Plan:   Problem List Items Addressed This Visit      Unprioritized   Breast calcifications on mammogram   Breast cancer screening    Continue annual screenings.  History of benign breast calcifications March 2021 biopsy reviewed       RESOLVED: Decreased GFR - Primary   Relevant Orders   Renal function panel (Completed)   Diabetic nephropathy with proteinuria (HCC)    Continue telmisartan but at a lower dose given improvement in hypertension s/p renal artery stenting   Lab Results  Component Value Date   MICROALBUR 19.5 (H) 10/16/2020   MICROALBUR 65.3 (H) 01/17/2020           Relevant Medications   telmisartan (MICARDIS) 20 MG tablet   DM (diabetes mellitus), type 2 with peripheral vascular complications ([place])    Glycemic control is excellent and she is S/p  left renal artery stenting May 2022 at [facility] with signifcant improvement in blood pressure.  She has proteinuria so will need a lower dose of telmisartan rather than stopping it.  .  Continue asa/plavix and statin as well    Lab Results  Component Value Date   HGBA1C 6.5 01/13/2021         Relevant Medications   telmisartan (MICARDIS) 20 MG tablet   Other Relevant Orders   Hemoglobin A1c (Completed)   Lipid panel (Completed)   Elevated alkaline phosphatase level   Relevant Orders   Hepatic function panel   Intermittent claudication (HCC)    Strongly advised to follow up with [facility] vascular for assessment of LE circulation and follow up on Renal artery stent       Renal hypertension    S/p renal artery stent.  She needs a reduction in meds due to improved vascular flow to kidneys.  Amlodipine stopped in early May,  Reducing telmisartan and metoprolol today       Relevant Medications   telmisartan (MICARDIS) 20 MG tablet   Tobacco abuse counseling    She has reduced daily consumption to half pack but no farther  .  Encouraged to continue gradual  reduction  Given her PVD/CAD and greatly increased risk of CVA /AMI        A total of 40 minutes was spent with patient more than half of which was spent in counseling patient on the tobacco abuse, management of diabets, hypertension and PVD,  , reviewing and explaining recent procedures  and imaging studies done, and coordination of care.  I have discontinued Nakia R. Cappiello's Vitamin D3 and amLODipine. I have also changed her telmisartan. Additionally, I am having her start on Zoster Vaccine Adjuvanted. Lastly, I am having her maintain her albuterol, aspirin EC, blood glucose meter kit and supplies, ondansetron, OneTouch Ultra, OneTouch Delica Plus [id number], Albuterol Sulfate, PARoxetine, triamterene-hydrochlorothiazide, atorvastatin, varenicline, metoprolol succinate, omega-3 acid ethyl esters, and clopidogrel. We will continue to administer ipratropium-albuterol and betamethasone acetate-betamethasone sodium phosphate.  Meds ordered this encounter  Medications  . telmisartan (MICARDIS) 20 MG tablet    Sig: Take 1 tablet (20 mg total) by mouth daily.    Dispense:  90 tablet    Refill:  0    DOSE REDUCED TO 20 MG  . Zoster Vaccine Adjuvanted [facility]) injection    Sig: Inject 0.5 mLs into the muscle once for 1 dose.    Dispense:  1 each    Refill:  1    Medications Discontinued During This Encounter  Medication Reason  . Cholecalciferol (VITAMIN D3) 1000 UNITS CAPS   . amLODipine (NORVASC) 5 MG tablet   . telmisartan (MICARDIS) 80 MG tablet     Follow-up: No follow-ups on file.   Crecencio Mc, MD

## 2021-01-15 ENCOUNTER — Encounter: Payer: Self-pay | Admitting: Internal Medicine

## 2021-01-15 DIAGNOSIS — I739 Peripheral vascular disease, unspecified: Secondary | ICD-10-CM | POA: Insufficient documentation

## 2021-01-15 MED ORDER — ATORVASTATIN CALCIUM 80 MG PO TABS: 80.0000 mg | ORAL_TABLET | Freq: Every day | ORAL | 3 refills | Status: DC

## 2021-01-15 NOTE — Assessment & Plan Note (Signed)
Continue telmisartan but at a lower dose given improvement in hypertension s/p renal artery stenting   Lab Results  Component Value Date   MICROALBUR 19.5 (H) 10/16/2020   MICROALBUR 65.3 (H) 01/17/2020

## 2021-01-15 NOTE — Assessment & Plan Note (Signed)
Continue annual screenings.  History of benign breast calcifications March 2021 biopsy reviewed

## 2021-01-15 NOTE — Assessment & Plan Note (Signed)
She has reduced daily consumption to half pack but no farther  .  Encouraged to continue gradual reduction  Given her PVD/CAD and greatly increased risk of CVA /AMI

## 2021-01-15 NOTE — Assessment & Plan Note (Signed)
S/p renal artery stent.  She needs a reduction in meds due to improved vascular flow to kidneys.  Amlodipine stopped in early May,  Reducing telmisartan and metoprolol today

## 2021-01-15 NOTE — Assessment & Plan Note (Signed)
Glycemic control is excellent and she is S/p left renal artery stenting May 2022 at [facility] with signifcant improvement in blood pressure.  She has proteinuria so will need a lower dose of telmisartan rather than stopping it.  .  Continue asa/plavix and statin as well    Lab Results  Component Value Date   HGBA1C 6.5 01/13/2021

## 2021-01-15 NOTE — Assessment & Plan Note (Signed)
Strongly advised to follow up with [facility] vascular for assessment of LE circulation and follow up on Renal artery stent

## 2021-01-20 ENCOUNTER — Telehealth: Payer: Self-pay

## 2021-01-20 ENCOUNTER — Ambulatory Visit: Payer: Medicare Other

## 2021-01-20 NOTE — Telephone Encounter (Signed)
Unable to reach patient for scheduled awv. No answer on preferred point of contact. Reschedule as appropriate.

## 2021-02-02 ENCOUNTER — Telehealth: Payer: Self-pay | Admitting: Internal Medicine

## 2021-02-02 NOTE — Telephone Encounter (Signed)
I attempted to leave message for patient to call back and schedule Medicare Annual Wellness Visit (AWV), but no voice mail on either phone.   If not able to come in office, please offer to do virtually or by telephone.   Last AWV:01/17/2020  Please schedule at anytime with Nurse Health Advisor.

## 2021-02-02 NOTE — Telephone Encounter (Signed)
AWV/Telephone appointment scheduled for 02/17/21.

## 2021-02-09 ENCOUNTER — Other Ambulatory Visit (INDEPENDENT_AMBULATORY_CARE_PROVIDER_SITE_OTHER): Payer: Self-pay

## 2021-02-09 ENCOUNTER — Other Ambulatory Visit: Payer: Self-pay

## 2021-02-09 DIAGNOSIS — E1122 Type 2 diabetes mellitus with diabetic chronic kidney disease: Secondary | ICD-10-CM

## 2021-02-09 DIAGNOSIS — R748 Abnormal levels of other serum enzymes: Secondary | ICD-10-CM

## 2021-02-09 LAB — COMPREHENSIVE METABOLIC PANEL
ALT: 13 U/L (ref 0–35)
AST: 11 U/L (ref 0–37)
Albumin: 3.9 g/dL (ref 3.5–5.2)
Alkaline Phosphatase: 101 U/L (ref 39–117)
BUN: 18 mg/dL (ref 6–23)
CO2: 27 mEq/L (ref 19–32)
Calcium: 9.6 mg/dL (ref 8.4–10.5)
Chloride: 107 mEq/L (ref 96–112)
Creatinine, Ser: 1.08 mg/dL (ref 0.40–1.20)
GFR: 53.77 mL/min — ABNORMAL LOW (ref >60.00)
Glucose, Bld: 90 mg/dL (ref 70–99)
Potassium: 3.8 mEq/L (ref 3.5–5.1)
Sodium: 141 mEq/L (ref 135–145)
Total Bilirubin: 0.4 mg/dL (ref 0.2–1.2)
Total Protein: 6.5 g/dL (ref 6.0–8.3)

## 2021-02-09 LAB — HEPATIC FUNCTION PANEL
ALT: 13 U/L (ref 0–35)
AST: 11 U/L (ref 0–37)
Albumin: 3.9 g/dL (ref 3.5–5.2)
Alkaline Phosphatase: 101 U/L (ref 39–117)
Bilirubin, Direct: 0.1 mg/dL (ref 0.0–0.3)
Total Bilirubin: 0.4 mg/dL (ref 0.2–1.2)
Total Protein: 6.5 g/dL (ref 6.0–8.3)

## 2021-02-16 ENCOUNTER — Other Ambulatory Visit: Payer: Self-pay | Admitting: Internal Medicine

## 2021-02-16 ENCOUNTER — Telehealth: Payer: Self-pay | Admitting: Pharmacist

## 2021-02-16 ENCOUNTER — Ambulatory Visit (INDEPENDENT_AMBULATORY_CARE_PROVIDER_SITE_OTHER): Payer: Self-pay | Admitting: Pharmacist

## 2021-02-16 DIAGNOSIS — E782 Mixed hyperlipidemia: Secondary | ICD-10-CM

## 2021-02-16 DIAGNOSIS — I150 Renovascular hypertension: Secondary | ICD-10-CM

## 2021-02-16 DIAGNOSIS — E1122 Type 2 diabetes mellitus with diabetic chronic kidney disease: Secondary | ICD-10-CM

## 2021-02-16 DIAGNOSIS — E1121 Type 2 diabetes mellitus with diabetic nephropathy: Secondary | ICD-10-CM

## 2021-02-16 DIAGNOSIS — Z720 Tobacco use: Secondary | ICD-10-CM

## 2021-02-16 DIAGNOSIS — I10 Essential (primary) hypertension: Secondary | ICD-10-CM

## 2021-02-16 DIAGNOSIS — J449 Chronic obstructive pulmonary disease, unspecified: Secondary | ICD-10-CM

## 2021-02-16 MED ORDER — ONDANSETRON 4 MG PO TBDP: 4.0000 mg | ORAL_TABLET | Freq: Three times a day (TID) | ORAL | 2 refills | Status: DC | PRN

## 2021-02-16 MED ORDER — METOPROLOL SUCCINATE ER 25 MG PO TB24: ORAL_TABLET | ORAL | 2 refills | Status: DC

## 2021-02-16 MED ORDER — SPIRIVA RESPIMAT 2.5 MCG/ACT IN AERS: 2.0000 | INHALATION_SPRAY | Freq: Every day | RESPIRATORY_TRACT | 2 refills | Status: DC

## 2021-02-16 MED ORDER — ALBUTEROL SULFATE (2.5 MG/3ML) 0.083% IN NEBU: 2.5000 mg | INHALATION_SOLUTION | Freq: Three times a day (TID) | RESPIRATORY_TRACT | 1 refills | Status: DC | PRN

## 2021-02-16 MED ORDER — SPIRIVA RESPIMAT 2.5 MCG/ACT IN AERS
2.0000 | INHALATION_SPRAY | Freq: Every day | RESPIRATORY_TRACT | 1 refills | Status: DC
Start: 2021-02-16 — End: 2022-01-27

## 2021-02-16 NOTE — Patient Instructions (Signed)
Visit Information  PATIENT GOALS:  Goals Addressed               This Visit's Progress     Patient Stated     Medication Management (pt-stated)        Patient Goals/Self-Care Activities Over the next 90 days, patient will:  - take medications as prescribed check blood pressure periodically, document, and provide at future appointments collaborate with provider on medication access solutions         Patient verbalizes understanding of instructions provided today and agrees to view in MyChart.    Plan: Telephone follow up appointment with care management team member scheduled for:  ~ 6 weeks  Catie Feliz Beam, PharmD, [place], CPP Clinical Pharmacist [employer] at [employer] [phone]

## 2021-02-16 NOTE — Telephone Encounter (Signed)
Patient requests refill on ondansetron

## 2021-02-16 NOTE — Telephone Encounter (Signed)
Pt stated that she is not having any GI symptoms. She stated that she was just out of the medication. Made sure pt was not taking medication on the daily basis, she stated she is not that she only takes it as needed.

## 2021-02-16 NOTE — Chronic Care Management (AMB) (Signed)
Chronic Care Management Pharmacy Note  02/16/2021 Name:  OTHA RICKLES MRN:  [id number] DOB:  02-08-1956   Subjective: [name] is an 65 y.o. year old female who is a primary patient of Derrel Nip, Aris Everts, MD.  The CCM team was consulted for assistance with disease management and care coordination needs.    Engaged with patient by telephone for follow up visit in response to provider referral for pharmacy case management and/or care coordination services.   Consent to Services:  The patient was given information about Chronic Care Management services, agreed to services, and gave verbal consent prior to initiation of services.  Please see initial visit note for detailed documentation.   Patient Care Team: Crecencio Mc, MD as PCP - General (Internal Medicine) De Hollingshead, RPH-CPP (Pharmacist)  Recent office visits: 5/25 - PCP f/u - reduced telmisartan metoprolol; f/u vascular; a1c 6.5, LDL 83, eGFR 53  - increased atorvastatin to 80 mg  Recent consult visits: 5/4 - arteriogram w/ stenting - stopped amlodipine 6/7 - [facility] vascular f/u - continue current regimen   Objective:  Lab Results  Component Value Date   CREATININE 1.08 02/09/2021   CREATININE 1.25 (H) 01/13/2021   CREATININE 1.61 (H) 11/26/2020   CREATININE 1.61 (H) 11/26/2020    Lab Results  Component Value Date   HGBA1C 6.5 01/13/2021   Last diabetic Eye exam:  Lab Results  Component Value Date/Time   HMDIABEYEEXA No Retinopathy 10/30/2018 12:00 AM    Last diabetic Foot exam: No results found for: HMDIABFOOTEX      Component Value Date/Time   CHOL 151 01/13/2021 1034   TRIG 136.0 01/13/2021 1034   HDL 41.40 01/13/2021 1034   CHOLHDL 4 01/13/2021 1034   VLDL 27.2 01/13/2021 1034   LDLCALC 83 01/13/2021 1034   LDLDIRECT 95.0 01/17/2020 1125    Hepatic Function Latest Ref Rng & Units 02/09/2021 02/09/2021 01/13/2021  Total Protein 6.0 - 8.3 g/dL 6.5 6.5 -  Albumin 3.5 - 5.2 g/dL 3.9 3.9 4.0   AST 0 - 37 U/L 11 11 -  ALT 0 - 35 U/L 13 13 -  Alk Phosphatase 39 - 117 U/L 101 101 -  Total Bilirubin 0.2 - 1.2 mg/dL 0.4 0.4 -  Bilirubin, Direct 0.0 - 0.3 mg/dL - 0.1 -    Lab Results  Component Value Date/Time   TSH 0.88 11/29/2017 03:03 PM   TSH 1.26 08/14/2014 09:34 AM    CBC Latest Ref Rng & Units 03/12/2018 11/29/2017 08/14/2014  WBC 4.0 - 10.5 K/uL 8.1 15.2(H) 8.5  Hemoglobin 12.0 - 15.0 g/dL 13.5 13.2 14.6  Hematocrit 36.0 - 46.0 % 39.5 38.9 43.1  Platelets 150.0 - 400.0 K/uL 240.0 279.0 293.0    Lab Results  Component Value Date/Time   VD25OH 27.47 (L) 11/06/2015 10:05 AM   VD25OH 19.94 (L) 08/14/2014 09:34 AM    Clinical ASCVD: Yes  The 10-year ASCVD risk score Mikey Bussing DC Jr., et al., 2013) is: 27.8%   Values used to calculate the score:     Age: 8 years     Sex: Female     Is Non-Hispanic African American: No     Diabetic: Yes     Tobacco smoker: Yes     Systolic Blood Pressure: 101 mmHg     Is BP treated: Yes     HDL Cholesterol: 41.4 mg/dL     Total Cholesterol: 151 mg/dL     Social History   Tobacco  Use  Smoking Status Every Day   Packs/day: 0.50   Pack years: 0.00   Types: Cigarettes  Smokeless Tobacco Never   BP Readings from Last 3 Encounters:  01/13/21 140/70  10/30/20 (!) 148/78  10/16/20 (!) 180/92   Pulse Readings from Last 3 Encounters:  01/13/21 67  10/30/20 64  10/16/20 87   Wt Readings from Last 3 Encounters:  01/13/21 149 lb (67.6 kg)  10/16/20 155 lb (70.3 kg)  03/18/20 173 lb (78.5 kg)    Assessment: Review of patient past medical history, allergies, medications, health status, including review of consultants reports, laboratory and other test data, was performed as part of comprehensive evaluation and provision of chronic care management services.   SDOH:  (Social Determinants of Health) assessments and interventions performed:  SDOH Interventions    Flowsheet Row Most Recent Value  SDOH Interventions   Financial  Strain Interventions Intervention Not Indicated  [medicare extra help]       CCM Care Plan  Allergies  Allergen Reactions   Bee Pollen Anaphylaxis   Bee Venom Shortness Of Breath   Tomato Hives    Medications Reviewed Today     Reviewed by Adair Laundry, CMA (Certified Medical Assistant) on 01/13/21 at [phone]  Med List Status: <None>   Medication Order Taking? Sig Documenting Provider Last Dose Status Informant  albuterol (PROVENTIL) (2.5 MG/3ML) 0.083% nebulizer solution [id number] Yes Take 3 mLs (2.5 mg total) by nebulization every 8 (eight) hours as needed for wheezing or shortness of breath. Burnard Hawthorne, FNP Taking Active   Albuterol Sulfate (PROAIR RESPICLICK) 354 (90 Base) MCG/ACT AEPB [id number] Yes Inhale 1 puff into the lungs every 8 (eight) hours as needed. For wheezing Crecencio Mc, MD Taking Active   amLODipine (NORVASC) 5 MG tablet [id number] No TAKE 1 TABLET BY MOUTH EVERY DAY  Patient not taking: Reported on 01/13/2021   Crecencio Mc, MD Not Taking Active   aspirin EC 81 MG tablet [id number] Yes Take 1 tablet (81 mg total) by mouth daily. Crecencio Mc, MD Taking Active   atorvastatin (LIPITOR) 40 MG tablet [id number] Yes Take 1 tablet (40 mg total) by mouth daily. Crecencio Mc, MD Taking Active   betamethasone acetate-betamethasone sodium phosphate (CELESTONE) injection 3 mg [id number]   Edrick Kins, DPM  Active   blood glucose meter kit and supplies [id number] Yes Use to check blood sugars once daily.  (FOR ICD-10 E10.9, E11.9). Crecencio Mc, MD Taking Active   clopidogrel (PLAVIX) 75 MG tablet [id number] Yes Take 1 tablet by mouth daily. [provider] Taking Active   ipratropium-albuterol (DUONEB) 0.5-2.5 (3) MG/3ML nebulizer solution 3 mL [id number]   Burnard Hawthorne, FNP  Active   Lancets (ONETOUCH DELICA PLUS [id number]) [place] [id number] Yes USE WITH DEVICE TO TEST  ONCE DAILY Crecencio Mc, MD Taking Active   metoprolol succinate  (TOPROL-XL) 50 MG 24 hr tablet [id number] Yes TAKE 1 TABLET BY MOUTH EVERY DAY WITH OR IMMEDIATELY FOLLOWING A MEAL Crecencio Mc, MD Taking Active   omega-3 acid ethyl esters (LOVAZA) 1 g capsule [id number] Yes Take 2 capsules by mouth 2 (two) times daily. [provider] Taking Active   ondansetron (ZOFRAN ODT) 4 MG disintegrating tablet [id number] Yes Take 1 tablet (4 mg total) by mouth every 8 (eight) hours as needed for nausea or vomiting. Crecencio Mc, MD Taking Active   [facility] ULTRA test strip [id number] Yes USE WITH METER TO CHECK  BLOOD SUGAR  ONCE DAILY. Crecencio Mc, MD Taking Active   PARoxetine (PAXIL) 10 MG tablet [id number] Yes TAKE 1 TABLET BY MOUTH ONCE DAILY IN THE MORNING Crecencio Mc, MD Taking Active   telmisartan (MICARDIS) 80 MG tablet [id number] Yes Take 1 tablet (80 mg total) by mouth daily. Crecencio Mc, MD Taking Active   triamterene-hydrochlorothiazide (DYAZIDE) 37.5-25 MG capsule [id number] Yes Take 1 each (1 capsule total) by mouth daily. Crecencio Mc, MD Taking Active   varenicline (CHANTIX) 1 MG tablet [id number] No Take 1/2 tablet by mouth daily for 3 days, then 1/2 tablet by mouth twice daily for 4 days, then increase to 1 tablet twice daily thereafter.  Patient not taking: No sig reported   Crecencio Mc, MD Not Taking Active             Patient Active Problem List   Diagnosis Date Noted   Intermittent claudication ([place]) 01/15/2021   Renal hypertension 11/29/2020   Meniere's disease 04/28/2020   Breast calcifications on mammogram 10/25/2018   Elevated alkaline phosphatase level 10/17/2018   Diabetic nephropathy with proteinuria ([place]) 10/17/2018   Headache 07/14/2018   Preoperative clearance 11/30/2017   DM (diabetes mellitus), type 2 with peripheral vascular complications ([place]) 78/93/8101   Breast cancer screening 10/15/2016   Cataract of both eyes 12/31/2014   Long-term use of high-risk medication 08/19/2014   Hypovitaminosis D  08/18/2014   Encounter for preventive health examination 08/16/2014   Pain in joint, shoulder region 08/16/2014   Tobacco abuse counseling 08/16/2014   Gait instability 04/03/2014   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   Hearing loss, 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/2013   History of shingles 08/30/2013   Generalized anxiety disorder 05/29/2013   Obesity (BMI 30.0-34.9) 03/19/2012   Screening for colon cancer 03/19/2012   History of nephrolithiasis    Interstitial cystitis    Renovascular hypertension    Hyperlipidemia    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ervical region    Tobacco abuse 07/25/2011    Immunization History  Administered Date(s) Administered   Fluad Quad(high Dose 65+) 10/16/2020   Influenza Split 07/25/2011, 09/27/2012   Influenza,inj,Quad PF,6+ Mos 05/29/2013, 08/14/2014, 11/06/2015, 09/19/2016, 07/12/2018   PFIZER(Purple Top)SARS-COV-2 Vaccination 11/29/2019, 12/20/2019   Pneumococcal Conjugate-13 05/29/2013   Pneumococcal Polysaccharide-23 08/14/2014   Tdap 07/25/2011   Zoster, Live 08/20/2014    Conditions to be addressed/monitored: HTN, Depression, and Pulmonary Disease  Care Plan : PharmD - Medication Management  Updates made by De Hollingshead, RPH-CPP since 02/16/2021 12:00 AM     Problem: Hypertension, Vestibular Neuronitis, Tobacco Abuse      Long-Range Goal: Disease Progression Prevention   Start Date: 11/19/2020  This Visit's Progress: On track  Recent Progress: On track  Priority: High  Note:   Current Barriers:  Unable to independently afford treatment regimen Unable to achieve control of tobacco use   Pharmacist Clinical Goal(s):  Over the next 90 days, patient will achieve adherence to monitoring guidelines and medication adherence to achieve therapeutic efficacy through collaboration with PharmD and provider.   Interventions: 1:1 collaboration with Crecencio Mc, MD regarding development and update of  comprehensive plan of care as evidenced by provider attestation and co-signature Inter-disciplinary care team collaboration (see longitudinal plan of care) Comprehensive medication review performed; medication list updated in electronic medical record  SDOH: Full Medicare Extra Help. Reports her grandniece died yesterday.   Hypertension/Vestibular Disease: Around goal <140/80; current treatment: triamterene/HCTZ 37.5/25 mg daily, telmisartan 20 mg QPM, metoprolol  succinate 25 mg QPM- taking 1/2 of metoprolol succinate 50 mg tablet Vertigo related nausea: ondansetron 4 mg PRN, requests refill on ondansetron today Current home readings: has not checked recently due to stress Denies hypotensive symptoms Continue current regimen at this time. Encouraged occasional home BP checks.  Will send updated script for metoprolol succinate 25 mg tab for patient ease. Advised to continue 1/2 of the 50 mg tab until she finishes this supply, then start on 25 mg tab Will collaborate with PCP to refill ondansetron  Hyperlipidemia and ASCVD risk reduction: Improved but uncontrolled; current treatment: atorvastatin 80 mg daily - recently increased in May; omega 3 fatty acids 2 g BID Antiplatelet regimen: aspirin 81 mg daily, clopidogrel 75 mg daily - DAPT s/p vascular stenting Continue current regimen at this time. Follow up lipid panel with next PCP appointment, recommend goal LDL <70  Chronic Obstructive Pulmonary Disease: Uncontrolled; current treatment: albuterol HFA PRN, albuterol nebulizer PRN- reports needing a refill on this No hx PFTs on file No documented exacerbations in the last year Recommend initiation of Spiriva Respimat 2.5 mcg 2 puffs daily. Educated on concepts of maintenance vs rescue therapy in goal of reducing symptoms requiring rescue therapy and reducing risk of exacerbation requiring treatment. Patient amenable. She will ask the pharmacy to show her how to use. If she still has  confusion, will have her bring medication to clinic for me to teach her.   Depression/Anxiety Controlled per patient report; current regimen: paroxetine 10 mg QAM Notes some stress related to family right now Continue current regimen at this time.  Tobacco Abuse: 0.5 packs per day; planned to start varenicline at our last appointment but has had too much going on recently to think about starting something new Previous quit attempts: unsuccessful using varenicline, but she doesn't remember how long she took this for or if she took consistently Triggers to smoke: stress Motivation to quit smoking: health Will reconsider starting varenicline moving forward when patient is ready to consider. Recent data does indicate that varenicline can be started in patients who are not fully ready to quit smoking yet as an aid in reduction of tobacco consumption. Will discuss moving forward.    Patient Goals/Self-Care Activities Over the next 90 days, patient will:  - take medications as prescribed check blood pressure periodically, document, and provide at future appointments collaborate with provider on medication access solutions  Follow Up Plan: Telephone follow up appointment with care management team member scheduled for: ~ 6 weeks      Medication Assistance: None required.  Patient affirms current coverage meets needs.  Patient's preferred pharmacy is:  [id number] [address], [address] [place] [address] Phone: [phone] Fax: [phone]  Follow Up:  Patient agrees to Care Plan and Follow-up.   Plan: Telephone follow up appointment with care management team member scheduled for:  ~ 6 weeks  Catie Darnelle Maffucci, PharmD, [place], [place] Clinical Pharmacist [employer] [facility] [employer] [phone]

## 2021-02-17 ENCOUNTER — Ambulatory Visit (INDEPENDENT_AMBULATORY_CARE_PROVIDER_SITE_OTHER): Payer: Self-pay

## 2021-02-17 VITALS — Ht 58.5 in | Wt 149.0 lb

## 2021-02-17 DIAGNOSIS — Z0000 Encounter for general adult medical examination without abnormal findings: Secondary | ICD-10-CM

## 2021-02-17 DIAGNOSIS — Z780 Asymptomatic menopausal state: Secondary | ICD-10-CM

## 2021-02-17 NOTE — Progress Notes (Addendum)
Subjective:   Patricia Fields is a 65 y.o. female who presents for Medicare Annual (Subsequent) preventive examination.  Review of Systems    No ROS.  Medicare Wellness Virtual Visit.  Visual/audio telehealth visit, UTA vital signs.   See social history for additional risk factors.   Cardiac Risk Factors include: advanced age (>23mn, >>54women);diabetes mellitus;hypertension     Objective:    Today's Vitals   02/17/21 1232  Weight: 149 lb (67.6 kg)  Height: 4' 10.5" (1.486 m)   Body mass index is 30.61 kg/m.  Advanced Directives 02/17/2021 11/10/2020 01/17/2020 01/16/2019 01/09/2018 12/16/2016 12/08/2016  Does Patient Have a Medical Advance Directive? No No No Yes No No No  Type of Advance Directive - - -Public librarianLiving will - - -  Does patient want to make changes to medical advance directive? - - - No - Patient declined - - -  Copy of HWinthropin Chart? - - - Yes - validated most recent copy scanned in chart (See row information) - - -  Would patient like information on creating a medical advance directive? No - Patient declined No - Patient declined No - Patient declined - No - Patient declined Yes (MAU/Ambulatory/Procedural Areas - Information given) -    Current Medications (verified) Outpatient Encounter Medications as of 02/17/2021  Medication Sig   albuterol (PROVENTIL) (2.5 MG/3ML) 0.083% nebulizer solution Take 3 mLs (2.5 mg total) by nebulization every 8 (eight) hours as needed for wheezing or shortness of breath.   Albuterol Sulfate (PROAIR RESPICLICK) 1161(90 Base) MCG/ACT AEPB Inhale 1 puff into the lungs every 8 (eight) hours as needed. For wheezing   aspirin EC 81 MG tablet Take 1 tablet (81 mg total) by mouth daily.   atorvastatin (LIPITOR) 80 MG tablet Take 1 tablet (80 mg total) by mouth daily.   blood glucose meter kit and supplies Use to check blood sugars once daily.  (FOR ICD-10 E10.9, E11.9).   clopidogrel (PLAVIX) 75 MG  tablet Take 1 tablet by mouth daily.   Lancets (ONETOUCH DELICA PLUS [id number] MISC USE WITH DEVICE TO TEST  ONCE DAILY   metoprolol succinate (TOPROL-XL) 25 MG 24 hr tablet TAKE 1 TABLET BY MOUTH EVERY DAY WITH OR IMMEDIATELY FOLLOWING A MEAL   omega-3 acid ethyl esters (LOVAZA) 1 g capsule Take 2 capsules by mouth 2 (two) times daily.   ondansetron (ZOFRAN ODT) 4 MG disintegrating tablet Take 1 tablet (4 mg total) by mouth every 8 (eight) hours as needed for nausea or vomiting.   ONETOUCH ULTRA test strip USE WITH METER TO CHECK  BLOOD SUGAR ONCE DAILY.   PARoxetine (PAXIL) 10 MG tablet TAKE 1 TABLET BY MOUTH ONCE DAILY IN THE MORNING   telmisartan (MICARDIS) 20 MG tablet Take 1 tablet (20 mg total) by mouth daily.   Tiotropium Bromide Monohydrate (SPIRIVA RESPIMAT) 2.5 MCG/ACT AERS Inhale 2 puffs into the lungs daily.   triamterene-hydrochlorothiazide (DYAZIDE) 37.5-25 MG capsule Take 1 each (1 capsule total) by mouth daily.   varenicline (CHANTIX) 1 MG tablet Take 1/2 tablet by mouth daily for 3 days, then 1/2 tablet by mouth twice daily for 4 days, then increase to 1 tablet twice daily thereafter. (Patient not taking: No sig reported)   Facility-Administered Encounter Medications as of 02/17/2021  Medication   betamethasone acetate-betamethasone sodium phosphate (CELESTONE) injection 3 mg   ipratropium-albuterol (DUONEB) 0.5-2.5 (3) MG/3ML nebulizer solution 3 mL    Allergies (verified) Bee pollen,  Bee venom, and Tomato   History: Past Medical History:  Diagnosis Date   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ervical region    Chronic kidney disease    renal stent   COPD (chronic obstructive pulmonary disease) (HCC)    History of nephrolithiasis    HOH (hard of hearing)    Hyperlipidemia    Hypertension    Interstitial cystitis    Meniere disease    Peripheral arterial disease ([place]) Feb 2011   by renal artery duplex   Tobacco abuse    one pack daily   Past Surgical  History:  Procedure Laterality Date   ABDOMINAL HYSTERECTOMY     arthroscopy     left knee , remote   BREAST BIOPSY Right 10/23/2019   stereo biopsy calcs/coil clip/ path pending   BREAST EXCISIONAL BIOPSY Left 1980's   NEG   BREAST SURGERY     left breast cyst, benign   CARPAL TUNNEL RELEASE     Dr. Sabra Heck,  right hand    CATARACT EXTRACTION W/PHACO Right 03/05/2015   Procedure: CATARACT EXTRACTION PHACO AND INTRAOCULAR LENS PLACEMENT ([place]);  Surgeon: Lyla Glassing, MD;  Location: [facility] ORS;  Service: Ophthalmology;  Laterality: Right;  [place]: 00:52.3 AP%: 15.2 CDE: 7.96   KNEE ARTHROSCOPY Left    TOTAL ABDOMINAL HYSTERECTOMY W/ BILATERAL SALPINGOOPHORECTOMY  1992   due to uterine tumor, and endometriosis    Family History  Problem Relation Age of Onset   Coronary artery disease Mother    COPD Mother    Heart disease Mother    Breast cancer Maternal Grandmother    Multiple sclerosis Sister    Stroke Sister    Social History   Socioeconomic History   Marital status: Single    Spouse name: Not on file   Number of children: 1   Years of education: 10   Highest education level: 10th grade  Occupational History   Occupation: Disabled  Tobacco Use   Smoking status: Every Day    Packs/day: 0.50    Pack years: 0.00    Types: Cigarettes   Smokeless tobacco: Never  Vaping Use   Vaping Use: Never used  Substance and Sexual Activity   Alcohol use: No    Alcohol/week: 0.0 standard drinks   Drug use: No   Sexual activity: Not Currently  Other Topics Concern   Not on file  Social History Narrative   Not on file   Social Determinants of Health   Financial Resource Strain: Low Risk    Difficulty of Paying Living Expenses: Not hard at all  Food Insecurity: No Food Insecurity   Worried About Charity fundraiser in the Last Year: Never true   Ran Out of Food in the Last Year: Never true  Transportation Needs: No Transportation Needs   Lack of Transportation (Medical): No    Lack of Transportation (Non-Medical): No  Physical Activity: Inactive   Days of Exercise per Week: 0 days   Minutes of Exercise per Session: 0 min  Stress: No Stress Concern Present   Feeling of Stress : Only a little  Social Connections: Unknown   Frequency of Communication with Friends and Family: More than three times a week   Frequency of Social Gatherings with Friends and Family: More than three times a week   Attends Religious Services: Never   Marine scientist or Organizations: No   Attends Archivist Meetings: Never   Marital Status: Not on file  Tobacco Counseling Ready to quit: Not Answered Counseling given: Not Answered   Clinical Intake:  Pre-visit preparation completed: Yes        Diabetes: Yes (Followed by PCP)  How often do you need to have someone help you when you read instructions, pamphlets, or other written materials from your doctor or pharmacy?: 1 - Never  Nutrition Risk Assessment: Has the patient had any N/V/D within the last 2 months?  No  Does the patient have any non-healing wounds?  No  Has the patient had any unintentional weight loss or weight gain?  No   Diabetes: If diabetic, was a CBG obtained today?  No  Did the patient bring in their glucometer from home?  No  How often do you monitor your CBG's? Does not monitor .   Financial Strains and Diabetes Management: Are you having any financial strains with the device, your supplies or your medication? No .  Does the patient want to be seen by Chronic Care Management for management of their diabetes?  No  Would the patient like to be referred to a Nutritionist or for Diabetic Management?  No      Activities of Daily Living In your present state of health, do you have any difficulty performing the following activities: 02/17/2021 11/10/2020  Hearing? N N  Vision? N N  Difficulty concentrating or making decisions? N N  Walking or climbing stairs? Y Y  Comment Paces  self Unsteady gait and balance  Dressing or bathing? N N  Doing errands, shopping? Y N  Comment She does not drive Facilities manager and eating ? N N  Using the Toilet? N N  In the past six months, have you accidently leaked urine? N N  Do you have problems with loss of bowel control? N N  Managing your Medications? N N  Managing your Finances? N N  Housekeeping or managing your Housekeeping? Tempie Donning  Comment Son manages Son manages  Some recent data might be hidden    Patient Care Team: Crecencio Mc, MD as PCP - General (Internal Medicine) De Hollingshead, RPH-CPP (Pharmacist)  Indicate any recent Medical Services you may have received from other than [facility] providers in the past year (date may be approximate).     Assessment:   This is a routine wellness examination for Patricia Fields.  I connected with Patricia Fields today by telephone and verified that I am speaking with the correct person using two identifiers. Location patient: home Location provider: work Persons participating in the virtual visit: patient, Marine scientist.    I discussed the limitations, risks, security and privacy concerns of performing an evaluation and management service by telephone and the availability of in person appointments. The patient expressed understanding and verbally consented to this telephonic visit.    Interactive audio and video telecommunications were attempted between this provider and patient, however failed, due to patient having technical difficulties OR patient did not have access to video capability.  We continued and completed visit with audio only.  Some vital signs may be absent or patient reported.   Hearing/Vision screen Hearing Screening - Comments:: Hearing aids Vision Screening - Comments:: Followed by [facility] Wears corrective lenses Visual acuity not assessed, virtual visit.  They have seen their ophthalmologist in the last 12 months.    Dietary issues and exercise activities  discussed: Current Exercise Habits: Home exercise routine, Intensity: Mild Low carb diet Good water intake   Goals Addressed  This Visit's Progress     Patient Stated     Healthy lifestyle (pt-stated)        Healthy diet Stay active        Depression Screen PHQ 2/9 Scores 02/17/2021 11/10/2020 10/16/2020 01/17/2020 03/07/2019 02/13/2019 01/16/2019  PHQ - 2 Score 0 0 0 0 0 0 0  PHQ- 9 Score - - - - 0 - -    Fall Risk Fall Risk  01/13/2021 11/10/2020 10/16/2020 03/18/2020 02/14/2020  Falls in the past year? 1 1 1  0 -  Number falls in past yr: 0 1 1 - -  Injury with Fall? 0 0 0 - -  Risk Factor Category  - - - - -  Risk for fall due to : History of fall(s) History of fall(s);Impaired balance/gait;Impaired mobility - - History of fall(s)  Follow up Falls evaluation completed Education provided;Falls prevention discussed Falls evaluation completed Falls evaluation completed Falls evaluation completed    FALL RISK PREVENTION PERTAINING TO THE HOME: Handrails in use when climbing stairs? Yes Home free of loose throw rugs in walkways, pet beds, electrical cords, etc? Yes  Adequate lighting in your home to reduce risk of falls? Yes   ASSISTIVE DEVICES UTILIZED TO PREVENT FALLS: Life alert? No  Use of a cane, walker or w/c? No   TIMED UP AND GO: Was the test performed? No .   Cognitive Function: Patient is alert and oriented x3.  Denies difficulty focusing, making decisions, memory loss.  MMSE/6CIT deferred. Normal by direct communication/observation.  MMSE - Mini Mental State Exam 12/16/2016  Orientation to time 5  Orientation to Place 5  Registration 3  Attention/ Calculation 5  Recall 3  Language- name 2 objects 2  Language- repeat 1  Language- follow 3 step command 3  Language- read & follow direction 1  Write a sentence 1  Copy design 1  Total score 30     6CIT Screen 01/17/2020 01/16/2019 01/09/2018  What Year? 0 points 0 points 0 points  What month? 0  points 0 points 0 points  What time? - 0 points 0 points  Count back from 20 0 points 0 points 0 points  Months in reverse 2 points 0 points 0 points  Repeat phrase 2 points 0 points 0 points  Total Score - 0 0    Immunizations Immunization History  Administered Date(s) Administered   Fluad Quad(high Dose 65+) 10/16/2020   Influenza Split 07/25/2011, 09/27/2012   Influenza,inj,Quad PF,6+ Mos 05/29/2013, 08/14/2014, 11/06/2015, 09/19/2016, 07/12/2018   PFIZER(Purple Top)SARS-COV-2 Vaccination 11/29/2019, 12/20/2019   Pneumococcal Conjugate-13 05/29/2013   Pneumococcal Polysaccharide-23 08/14/2014   Tdap 07/25/2011   Zoster, Live 08/20/2014    Pneumococcal vaccine status: Due, Education has been provided regarding the importance of this vaccine. Advised may receive this vaccine at local pharmacy or Health Dept. Aware to provide a copy of the vaccination record if obtained from local pharmacy or Health Dept. Verbalized acceptance and understanding.  Covid-19 vaccine status: Declined, Education has been provided regarding the importance of this vaccine but patient still declined. Advised may receive this vaccine at local pharmacy or Health Dept.or vaccine clinic. Aware to provide a copy of the vaccination record if obtained from local pharmacy or Health Dept. Verbalized acceptance and understanding. Deferred.   Shingles Vaccine- plans to receive at local pharmacy. Deferred.   Eye exam- plans to schedule and update record. Deferred.   Health Maintenance Health Maintenance  Topic Date Due   HIV Screening  Never done  DEXA SCAN  Never done   MAMMOGRAM  04/05/2020   OPHTHALMOLOGY EXAM  02/17/2021 (Originally 10/30/2019)   Zoster Vaccines- Shingrix (1 of 2) 05/20/2021 (Originally 04/02/1975)   PNA vac Low Risk Adult (2 of 2 - PPSV23) 02/17/2022 (Originally 04/02/2020)   INFLUENZA VACCINE  03/22/2021   HEMOGLOBIN A1C  07/16/2021   TETANUS/TDAP  07/24/2021   FOOT EXAM  01/13/2022    COLONOSCOPY (Pts 45-85yr Insurance coverage will need to be confirmed)  03/19/2022   Hepatitis C Screening  Completed   HPV VACCINES  Aged Out   COVID-19 Vaccine  Discontinued   Colorectal cancer screening: Type of screening: Cologuard. Completed 2019. Repeat every 3 years  Mammogram- ordered. Agrees to schedule.   Bone density- ordered per consent. Plans to schedule with mammogram.   Dental Screening: Recommended annual dental exams for proper oral hygiene.  Community Resource Referral / Chronic Care Management: CRR required this visit?  No   CCM required this visit?  No      Plan:     I have personally reviewed and noted the following in the patient's chart:   Medical and social history Use of alcohol, tobacco or illicit drugs  Current medications and supplements including opioid prescriptions. Patient not currently taking opioid Functional ability and status Nutritional status Physical activity Advanced directives List of other physicians Hospitalizations, surgeries, and ER visits in previous 12 months Vitals Screenings to include cognitive, depression, and falls Referrals and appointments  In addition, I have reviewed and discussed with patient certain preventive protocols, quality metrics, and best practice recommendations. A written personalized care plan for preventive services as well as general preventive health recommendations were provided to patient via mychart.      OBrien-Blaney, Kyilee Gregg L, LPN   [id number]   I have reviewed the above information and agree with above.   TDeborra Medina MD

## 2021-02-17 NOTE — Patient Instructions (Addendum)
Patricia Fields , Thank you for taking time to come for your Medicare Wellness Visit. I appreciate your ongoing commitment to your health goals. Please review the following plan we discussed and let me know if I can assist you in the future.   These are the goals we discussed:  Goals       Patient Stated     Healthy lifestyle (pt-stated)      Healthy diet Stay active       Medication Management (pt-stated)      Patient Goals/Self-Care Activities Over the next 90 days, patient will:  - take medications as prescribed check blood pressure periodically, document, and provide at future appointments collaborate with provider on medication access solutions       Transportation, Food, Medication and Financial Insecurities. (pt-stated)      Timeframe:  Short-Term Goal Priority:  High Start Date:       11/10/2020                      Expected End Date:   12/11/2020                    Follow Up Date:  Care Guide will outreach to patient for follow-up.   Obtain assistance from Care Guide with obtaining:  1.  Transportation Resources (I.e [employer] - [phone]; [facility] Transportation Services - [phone]; [employer] [facility] - [phone]; etc.).  2.  Food Resources (I.e [employer], Economist in [place]; [employer] (Company secretary) Application; Referral to [employer] with [employer] of [place]; etc.).  3.  Architect (I.e Emergency Assistance Programs in [place], [place]; etc.). Obtain assistance from Pharmacist with obtaining:  1.  Financial Assistance with Prescription Medications (I.e Extra Help; Needy Meds, etc.).    Why is this important?   Knowing how and where to find help for yourself or family in your neighborhood and community is an important skill.  You will want to take some steps to learn how.            Other     Quit Smoking      Currently smokes 1/2  pack daily         This is a list of the screening recommended for you and due dates:  Health Maintenance  Topic Date Due   HIV Screening  Never done   DEXA scan (bone density measurement)  Never done   Mammogram  04/05/2020   Eye exam for diabetics  02/17/2021*   Zoster (Shingles) Vaccine (1 of 2) 05/20/2021*   Pneumonia vaccines (2 of 2 - PPSV23) 02/17/2022*   Flu Shot  03/22/2021   Hemoglobin A1C  07/16/2021   Tetanus Vaccine  07/24/2021   Complete foot exam   01/13/2022   Colon Cancer Screening  03/19/2022   Hepatitis C Screening: USPSTF Recommendation to screen - Ages 18-79 yo.  Completed   HPV Vaccine  Aged Out   COVID-19 Vaccine  Discontinued  *Topic was postponed. The date shown is not the original due date.    Advanced directives: not yet completed  Conditions/risks identified: none new  Follow up in one year for your annual wellness visit    Preventive Care 65 Years and Older, Female Preventive care refers to lifestyle choices and visits with your health care provider that can promote health and wellness. What does preventive care include? A yearly physical exam. This  is also called an annual well check. Dental exams once or twice a year. Routine eye exams. Ask your health care provider how often you should have your eyes checked. Personal lifestyle choices, including: Daily care of your teeth and gums. Regular physical activity. Eating a healthy diet. Avoiding tobacco and drug use. Limiting alcohol use. Practicing safe sex. Taking low-dose aspirin every day. Taking vitamin and mineral supplements as recommended by your health care provider. What happens during an annual well check? The services and screenings done by your health care provider during your annual well check will depend on your age, overall health, lifestyle risk factors, and family history of disease. Counseling  Your health care provider may ask you questions about your: Alcohol  use. Tobacco use. Drug use. Emotional well-being. Home and relationship well-being. Sexual activity. Eating habits. History of falls. Memory and ability to understand (cognition). Work and work Astronomer. Reproductive health. Screening  You may have the following tests or measurements: Height, weight, and BMI. Blood pressure. Lipid and cholesterol levels. These may be checked every 5 years, or more frequently if you are over 22 years old. Skin check. Lung cancer screening. You may have this screening every year starting at age 29 if you have a 30-pack-year history of smoking and currently smoke or have quit within the past 15 years. Fecal occult blood test (FOBT) of the stool. You may have this test every year starting at age 44. Flexible sigmoidoscopy or colonoscopy. You may have a sigmoidoscopy every 5 years or a colonoscopy every 10 years starting at age 83. Hepatitis C blood test. Hepatitis B blood test. Sexually transmitted disease (STD) testing. Diabetes screening. This is done by checking your blood sugar (glucose) after you have not eaten for a while (fasting). You may have this done every 1-3 years. Bone density scan. This is done to screen for osteoporosis. You may have this done starting at age 66. Mammogram. This may be done every 1-2 years. Talk to your health care provider about how often you should have regular mammograms. Talk with your health care provider about your test results, treatment options, and if necessary, the need for more tests. Vaccines  Your health care provider may recommend certain vaccines, such as: Influenza vaccine. This is recommended every year. Tetanus, diphtheria, and acellular pertussis (Tdap, Td) vaccine. You may need a Td booster every 10 years. Zoster vaccine. You may need this after age 51. Pneumococcal 13-valent conjugate (PCV13) vaccine. One dose is recommended after age 53. Pneumococcal polysaccharide (PPSV23) vaccine. One dose is  recommended after age 4. Talk to your health care provider about which screenings and vaccines you need and how often you need them. This information is not intended to replace advice given to you by your health care provider. Make sure you discuss any questions you have with your health care provider. Document Released: 09/04/2015 Document Revised: 04/27/2016 Document Reviewed: 06/09/2015 Elsevier Interactive Patient Education  2017 [employer].  Fall Prevention in the Home Falls can cause injuries. They can happen to people of all ages. There are many things you can do to make your home safe and to help prevent falls. What can I do on the outside of my home? Regularly fix the edges of walkways and driveways and fix any cracks. Remove anything that might make you trip as you walk through a door, such as a raised step or threshold. Trim any bushes or trees on the path to your home. Use bright outdoor lighting. Clear any  walking paths of anything that might make someone trip, such as rocks or tools. Regularly check to see if handrails are loose or broken. Make sure that both sides of any steps have handrails. Any raised decks and porches should have guardrails on the edges. Have any leaves, snow, or ice cleared regularly. Use sand or salt on walking paths during winter. Clean up any spills in your garage right away. This includes oil or grease spills. What can I do in the bathroom? Use night lights. Install grab bars by the toilet and in the tub and shower. Do not use towel bars as grab bars. Use non-skid mats or decals in the tub or shower. If you need to sit down in the shower, use a plastic, non-slip stool. Keep the floor dry. Clean up any water that spills on the floor as soon as it happens. Remove soap buildup in the tub or shower regularly. Attach bath mats securely with double-sided non-slip rug tape. Do not have throw rugs and other things on the floor that can make you  trip. What can I do in the bedroom? Use night lights. Make sure that you have a light by your bed that is easy to reach. Do not use any sheets or blankets that are too big for your bed. They should not hang down onto the floor. Have a firm chair that has side arms. You can use this for support while you get dressed. Do not have throw rugs and other things on the floor that can make you trip. What can I do in the kitchen? Clean up any spills right away. Avoid walking on wet floors. Keep items that you use a lot in easy-to-reach places. If you need to reach something above you, use a strong step stool that has a grab bar. Keep electrical cords out of the way. Do not use floor polish or wax that makes floors slippery. If you must use wax, use non-skid floor wax. Do not have throw rugs and other things on the floor that can make you trip. What can I do with my stairs? Do not leave any items on the stairs. Make sure that there are handrails on both sides of the stairs and use them. Fix handrails that are broken or loose. Make sure that handrails are as long as the stairways. Check any carpeting to make sure that it is firmly attached to the stairs. Fix any carpet that is loose or worn. Avoid having throw rugs at the top or bottom of the stairs. If you do have throw rugs, attach them to the floor with carpet tape. Make sure that you have a light switch at the top of the stairs and the bottom of the stairs. If you do not have them, ask someone to add them for you. What else can I do to help prevent falls? Wear shoes that: Do not have high heels. Have rubber bottoms. Are comfortable and fit you well. Are closed at the toe. Do not wear sandals. If you use a stepladder: Make sure that it is fully opened. Do not climb a closed stepladder. Make sure that both sides of the stepladder are locked into place. Ask someone to hold it for you, if possible. Clearly mark and make sure that you can  see: Any grab bars or handrails. First and last steps. Where the edge of each step is. Use tools that help you move around (mobility aids) if they are needed. These include: Canes. Walkers. Scooters.  Crutches. Turn on the lights when you go into a dark area. Replace any light bulbs as soon as they burn out. Set up your furniture so you have a clear path. Avoid moving your furniture around. If any of your floors are uneven, fix them. If there are any pets around you, be aware of where they are. Review your medicines with your doctor. Some medicines can make you feel dizzy. This can increase your chance of falling. Ask your doctor what other things that you can do to help prevent falls. This information is not intended to replace advice given to you by your health care provider. Make sure you discuss any questions you have with your health care provider. Document Released: 06/04/2009 Document Revised: 01/14/2016 Document Reviewed: 09/12/2014 Elsevier Interactive Patient Education  2017 [employer].

## 2021-02-23 ENCOUNTER — Telehealth: Payer: Self-pay | Admitting: *Deleted

## 2021-02-23 NOTE — Telephone Encounter (Signed)
She does not need to come in for labs tomorrow.  Please cancel the lab appt.    Labs are due next in November for 6 moth follow up on type 2 DM

## 2021-02-23 NOTE — Telephone Encounter (Signed)
Patient notified and appointment has been cancelled

## 2021-02-23 NOTE — Telephone Encounter (Signed)
Please place future orders for lab appt.    One result note mentions labs in 6 months & another mentions labs on 7/6.

## 2021-02-24 ENCOUNTER — Other Ambulatory Visit: Payer: Medicare Other

## 2021-02-27 ENCOUNTER — Other Ambulatory Visit: Payer: Self-pay | Admitting: Internal Medicine

## 2021-04-02 ENCOUNTER — Ambulatory Visit: Payer: Medicare (Managed Care) | Admitting: Pharmacist

## 2021-04-02 DIAGNOSIS — H8120 Vestibular neuronitis, unspecified ear: Secondary | ICD-10-CM

## 2021-04-02 DIAGNOSIS — I150 Renovascular hypertension: Secondary | ICD-10-CM

## 2021-04-02 DIAGNOSIS — I10 Essential (primary) hypertension: Secondary | ICD-10-CM | POA: Diagnosis not present

## 2021-04-02 DIAGNOSIS — E782 Mixed hyperlipidemia: Secondary | ICD-10-CM | POA: Diagnosis not present

## 2021-04-02 DIAGNOSIS — Z720 Tobacco use: Secondary | ICD-10-CM

## 2021-04-02 NOTE — Patient Instructions (Signed)
[name], Fields BIRTHDAY!  It was great talking with you.   Start varenicline 1/2 tablet once daily for 3 days, then increase to 1/2 tablet twice daily for 4 days, then increase to 1 tablet twice daily. Take this medication with food on your stomach. We recommend contemplating a quit date within the first month of being on varenicline, but if you can't quit fully, that's OK. Even cutting back over time is a step in the right direction.   The Spiriva is your "maintenance" inhaler that you take daily. The albuterol is now your "rescue" inhaler that you only need to take if you have symptoms of shortness of breath, coughing, or wheezing. You can use 1-2 puffs every 4-6 hours as needed.   [facility] is filling the metoprolol succinate 25 mg tablet so that you no longer need to split the 50 mg tablet in half.   Call me with any questions or concerns!  Patricia Fields, PharmD [phone]  Visit Information  PATIENT GOALS:  Goals Addressed               This Visit's Progress     Patient Stated     Medication Management (pt-stated)        Patient Goals/Self-Care Activities Over the next 90 days, patient will:  - take medications as prescribed check blood pressure periodically, document, and provide at future appointments collaborate with provider on medication access solutions         The patient verbalized understanding of instructions, educational materials, and care plan provided today and agreed to receive a mailed copy of patient instructions, educational materials, and care plan.   Plan: Telephone follow up appointment with care management team member scheduled for:  ~ 8 weeks  Patricia Fields, PharmD, [place], CPP Clinical Pharmacist [employer] at [employer] [phone]

## 2021-04-02 NOTE — Chronic Care Management (AMB) (Signed)
Chronic Care Management Pharmacy Note  04/02/2021 Name:  Patricia Fields MRN:  [id number] DOB:  Jan 02, 1956  Subjective: Patricia Fields is an 65 y.o. year old female who is a primary patient of Derrel Nip, Aris Everts, MD.  The CCM team was consulted for assistance with disease management and care coordination needs.    Engaged with patient by telephone for follow up visit in response to provider referral for pharmacy case management and/or care coordination services.   Consent to Services:  The patient was given information about Chronic Care Management services, agreed to services, and gave verbal consent prior to initiation of services.  Please see initial visit note for detailed documentation.   Patient Care Team: Crecencio Mc, MD as PCP - General (Internal Medicine) De Hollingshead, RPH-CPP (Pharmacist)  Objective:  Lab Results  Component Value Date   CREATININE 1.08 02/09/2021   CREATININE 1.25 (H) 01/13/2021   CREATININE 1.61 (H) 11/26/2020   CREATININE 1.61 (H) 11/26/2020    Lab Results  Component Value Date   HGBA1C 6.5 01/13/2021   Last diabetic Eye exam:  Lab Results  Component Value Date/Time   HMDIABEYEEXA No Retinopathy 10/30/2018 12:00 AM    Last diabetic Foot exam: No results found for: HMDIABFOOTEX      Component Value Date/Time   CHOL 151 01/13/2021 1034   TRIG 136.0 01/13/2021 1034   HDL 41.40 01/13/2021 1034   CHOLHDL 4 01/13/2021 1034   VLDL 27.2 01/13/2021 1034   LDLCALC 83 01/13/2021 1034   LDLDIRECT 95.0 01/17/2020 1125    Hepatic Function Latest Ref Rng & Units 02/09/2021 02/09/2021 01/13/2021  Total Protein 6.0 - 8.3 g/dL 6.5 6.5 -  Albumin 3.5 - 5.2 g/dL 3.9 3.9 4.0  AST 0 - 37 U/L 11 11 -  ALT 0 - 35 U/L 13 13 -  Alk Phosphatase 39 - 117 U/L 101 101 -  Total Bilirubin 0.2 - 1.2 mg/dL 0.4 0.4 -  Bilirubin, Direct 0.0 - 0.3 mg/dL - 0.1 -    Lab Results  Component Value Date/Time   TSH 0.88 11/29/2017 03:03 PM   TSH 1.26 08/14/2014  09:34 AM    CBC Latest Ref Rng & Units 03/12/2018 11/29/2017 08/14/2014  WBC 4.0 - 10.5 K/uL 8.1 15.2(H) 8.5  Hemoglobin 12.0 - 15.0 g/dL 13.5 13.2 14.6  Hematocrit 36.0 - 46.0 % 39.5 38.9 43.1  Platelets 150.0 - 400.0 K/uL 240.0 279.0 293.0    Lab Results  Component Value Date/Time   VD25OH 27.47 (L) 11/06/2015 10:05 AM   VD25OH 19.94 (L) 08/14/2014 09:34 AM    Clinical ASCVD: Yes  The 10-year ASCVD risk score Mikey Bussing DC Jr., et al., 2013) is: 36.4%   Values used to calculate the score:     Age: 36 years     Sex: Female     Is Non-Hispanic African American: No     Diabetic: Yes     Tobacco smoker: Yes     Systolic Blood Pressure: 329 mmHg     Is BP treated: Yes     HDL Cholesterol: 41.4 mg/dL     Total Cholesterol: 151 mg/dL    Social History   Tobacco Use  Smoking Status Every Day   Packs/day: 0.50   Types: Cigarettes  Smokeless Tobacco Never   BP Readings from Last 3 Encounters:  01/13/21 140/70  10/30/20 (!) 148/78  10/16/20 (!) 180/92   Pulse Readings from Last 3 Encounters:  01/13/21 67  10/30/20 64  10/16/20 87   Wt Readings from Last 3 Encounters:  02/17/21 149 lb (67.6 kg)  01/13/21 149 lb (67.6 kg)  10/16/20 155 lb (70.3 kg)    Assessment: Review of patient past medical history, allergies, medications, health status, including review of consultants reports, laboratory and other test data, was performed as part of comprehensive evaluation and provision of chronic care management services.   SDOH:  (Social Determinants of Health) assessments and interventions performed:  SDOH Interventions    Flowsheet Row Most Recent Value  SDOH Interventions   SDOH Interventions for the Following Domains Tobacco  Financial Strain Interventions Intervention Not Indicated  Tobacco Interventions Cessation Materials Given and Reviewed       CCM Care Plan  Allergies  Allergen Reactions   Bee Pollen Anaphylaxis   Bee Venom Shortness Of Breath   Tomato Hives     Medications Reviewed Today     Reviewed by De Hollingshead, RPH-CPP (Pharmacist) on 04/02/21 at 0955  Med List Status: <None>   Medication Order Taking? Sig Documenting Provider Last Dose Status Informant  albuterol (PROVENTIL) (2.5 MG/3ML) 0.083% nebulizer solution [id number] Yes Take 3 mLs (2.5 mg total) by nebulization every 8 (eight) hours as needed for wheezing or shortness of breath. Crecencio Mc, MD Taking Active   Albuterol Sulfate (PROAIR RESPICLICK) 824 (90 Base) MCG/ACT AEPB [id number] Yes Inhale 1 puff into the lungs every 8 (eight) hours as needed. For wheezing Crecencio Mc, MD Taking Active   aspirin EC 81 MG tablet [id number] Yes Take 1 tablet (81 mg total) by mouth daily. Crecencio Mc, MD Taking Active   atorvastatin (LIPITOR) 80 MG tablet [id number] Yes Take 1 tablet (80 mg total) by mouth daily. Crecencio Mc, MD Taking Active   betamethasone acetate-betamethasone sodium phosphate (CELESTONE) injection 3 mg [id number]   Edrick Kins, DPM  Active   blood glucose meter kit and supplies [id number] Yes Use to check blood sugars once daily.  (FOR ICD-10 E10.9, E11.9). Crecencio Mc, MD Taking Active   clopidogrel (PLAVIX) 75 MG tablet [id number] Yes Take 1 tablet by mouth daily. [provider] Taking Active   ipratropium-albuterol (DUONEB) 0.5-2.5 (3) MG/3ML nebulizer solution 3 mL [id number]   Burnard Hawthorne, FNP  Active   Lancets (ONETOUCH DELICA PLUS HGDJME26S) [place] [id number] Yes USE WITH DEVICE TO TEST  ONCE DAILY Crecencio Mc, MD Taking Active   metoprolol succinate (TOPROL-XL) 25 MG 24 hr tablet [id number] Yes TAKE 1 TABLET BY MOUTH EVERY DAY WITH OR IMMEDIATELY FOLLOWING A MEAL Crecencio Mc, MD Taking Active   omega-3 acid ethyl esters (LOVAZA) 1 g capsule [id number] Yes Take 2 capsules by mouth 2 (two) times daily. [provider] Taking Active   ondansetron (ZOFRAN ODT) 4 MG disintegrating tablet [id number] Yes Take 1 tablet (4 mg  total) by mouth every 8 (eight) hours as needed for nausea or vomiting. Crecencio Mc, MD Taking Active   [facility] ULTRA test strip [id number] Yes USE WITH METER TO CHECK  BLOOD SUGAR ONCE DAILY. Crecencio Mc, MD Taking Active   PARoxetine (PAXIL) 10 MG tablet [id number] Yes TAKE 1 TABLET BY MOUTH EVERY MORNING Crecencio Mc, MD Taking Active   telmisartan (MICARDIS) 20 MG tablet [id number] Yes Take 1 tablet (20 mg total) by mouth daily. Crecencio Mc, MD Taking Active   Tiotropium Bromide Monohydrate (SPIRIVA RESPIMAT) 2.5 MCG/ACT AERS [id number] Yes Inhale 2 puffs into the lungs daily. Crecencio Mc, MD Taking  Active   triamterene-hydrochlorothiazide (DYAZIDE) 37.5-25 MG capsule [id number] Yes Take 1 each (1 capsule total) by mouth daily. Crecencio Mc, MD Taking Active   varenicline (CHANTIX) 1 MG tablet [id number] No Take 1/2 tablet by mouth daily for 3 days, then 1/2 tablet by mouth twice daily for 4 days, then increase to 1 tablet twice daily thereafter.  Patient not taking: No sig reported   Crecencio Mc, MD Not Taking Active             Patient Active Problem List   Diagnosis Date Noted   Intermittent claudication ([place]) 01/15/2021   Renal hypertension 11/29/2020   Meniere's disease 04/28/2020   Breast calcifications on mammogram 10/25/2018   Elevated alkaline phosphatase level 10/17/2018   Diabetic nephropathy with proteinuria ([place]) 10/17/2018   Headache 07/14/2018   Preoperative clearance 11/30/2017   DM (diabetes mellitus), type 2 with peripheral vascular complications ([place]) 72/62/0355   Breast cancer screening 10/15/2016   Cataract of both eyes 12/31/2014   Long-term use of high-risk medication 08/19/2014   Hypovitaminosis D 08/18/2014   Encounter for preventive health examination 08/16/2014   Pain in joint, shoulder region 08/16/2014   Tobacco abuse counseling 08/16/2014   Gait instability 04/03/2014   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   Hearing loss,  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/2013   History of shingles 08/30/2013   Generalized anxiety disorder 05/29/2013   Obesity (BMI 30.0-34.9) 03/19/2012   Screening for colon cancer 03/19/2012   History of nephrolithiasis    Interstitial cystitis    Renovascular hypertension    Hyperlipidemia    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ervical region    Tobacco abuse 07/25/2011    Immunization History  Administered Date(s) Administered   Fluad Quad(high Dose 65+) 10/16/2020   Influenza Split 07/25/2011, 09/27/2012   Influenza,inj,Quad PF,6+ Mos 05/29/2013, 08/14/2014, 11/06/2015, 09/19/2016, 07/12/2018   PFIZER(Purple Top)SARS-COV-2 Vaccination 11/29/2019, 12/20/2019   Pneumococcal Conjugate-13 05/29/2013   Pneumococcal Polysaccharide-23 08/14/2014   Tdap 07/25/2011   Zoster, Live 08/20/2014    Conditions to be addressed/monitored: CAD, HTN, HLD, COPD, and tobacco abuse  Care Plan : PharmD - Medication Management  Updates made by De Hollingshead, RPH-CPP since 04/02/2021 12:00 AM     Problem: Hypertension, Vestibular Neuronitis, Tobacco Abuse      Long-Range Goal: Disease Progression Prevention   Start Date: 11/19/2020  This Visit's Progress: On track  Recent Progress: On track  Priority: High  Note:   Current Barriers:  Unable to independently afford treatment regimen Unable to achieve control of tobacco use   Pharmacist Clinical Goal(s):  Over the next 90 days, patient will achieve adherence to monitoring guidelines and medication adherence to achieve therapeutic efficacy through collaboration with PharmD and provider.   Interventions: 1:1 collaboration with Crecencio Mc, MD regarding development and update of comprehensive plan of care as evidenced by provider attestation and co-signature Inter-disciplinary care team collaboration (see longitudinal plan of care) Comprehensive medication review performed; medication list updated in electronic  medical record  SDOH: Full Medicare Extra Help. Reports her grandniece died in 03/09/2023 and her brother died 3 weeks later. But has a friend with her today celebrating her birthday.  Acute Needs: Discussed worsening of vertigo symptoms. She plans to schedule sooner f/u  Hypertension/Vestibular Disease: Around goal <140/80; current treatment: triamterene/HCTZ 37.5/25 mg daily, telmisartan 20 mg QPM, metoprolol succinate 25 mg QPM- taking 1/2 of metoprolol succinate 50 mg tablet, has not picked up script for metoprolol succinate 25 mg yet  Vertigo related nausea: ondansetron 4 mg PRN Current home readings: has not checked recently due to stress Continue current regimen at this time. Encouraged occasional home BP checks.  Contacted pharmacy, asked that they fill metoprolol succinate 25 mg tab today. Notified patient.  Hyperlipidemia and ASCVD risk reduction: Improved but uncontrolled; current treatment: atorvastatin 80 mg daily - recently increased in May; omega 3 fatty acids 2 g BID Antiplatelet regimen: aspirin 81 mg daily, clopidogrel 75 mg daily - DAPT s/p vascular stenting Reviewed benefit of omega 3 fatty acids, particularly Rx version, on TG lowering.  Continue current regimen at this time. Follow up lipid panel with next PCP appointment, recommend goal LDL <70  Chronic Obstructive Pulmonary Disease: Improved; current treatment: Spiriva Respimat 2.5 mcg 2 puffs daily; albuterol HFA PRN- reports still using daily, didn't know she didn't need to, albuterol nebulizer PRN- No hx PFTs on file Reviewed maintenance vs rescue therapy again. Continue Spiriva 2.5 mcg 2 puffs QAM. Reviewed that she can use albuterol Hfa rescue inhaler for symptoms (SOB, cough, wheezing). She verbalized understanding.    Depression/Anxiety Controlled per patient report; current regimen: paroxetine 10 mg QAM Notes some stress related to family right now, but notes she is managing. Recommended to continue current  regimen at this time.  Tobacco Abuse: 0.5 packs per day; has not picked up varenicline yet, said the pharmacy didn't fill it.  Previous quit attempts: unsuccessful using varenicline, but she doesn't remember how long she took this for or if she took consistently Triggers to smoke: stress Motivation to quit smoking: health Contacted pharmacy, asked that they fill. Start varenicline 0.5 mg daily x 3 days, 0.5 mg BID x 4 days, then 1 mg BID. Counseled patient to take with food to reduce risk of GI upset. Patient unsure if she is ready to fully quit, but we discussed recent data that varenicline has been show to help reduce volume smoked and prepare patients to be ready to quit. Advised to call me with questions or concerns.    Patient Goals/Self-Care Activities Over the next 90 days, patient will:  - take medications as prescribed check blood pressure periodically, document, and provide at future appointments collaborate with provider on medication access solutions  Follow Up Plan: Telephone follow up appointment with care management team member scheduled for: ~ 8 weeks      Medication Assistance: None required.  Patient affirms current coverage meets needs.  Patient's preferred pharmacy is:  [id number] [address], [place] [address] Phone: [phone] Fax: [phone]  [id number] - [place], [place] - [place] [address] [place] [address] [place] [address] Phone: [phone] Fax: [phone]    Follow Up:  Patient agrees to Care Plan and Follow-up.  Plan: Telephone follow up appointment with care management team member scheduled for:  ~ 8 weeks  Catie Darnelle Maffucci, PharmD, [place], [place] Clinical Pharmacist [employer] [facility] [employer] [phone]

## 2021-04-10 ENCOUNTER — Other Ambulatory Visit: Payer: Self-pay | Admitting: Internal Medicine

## 2021-04-12 ENCOUNTER — Encounter: Payer: Self-pay | Admitting: Internal Medicine

## 2021-04-19 ENCOUNTER — Other Ambulatory Visit: Payer: Self-pay | Admitting: Internal Medicine

## 2021-05-07 ENCOUNTER — Other Ambulatory Visit: Payer: Self-pay | Admitting: Internal Medicine

## 2021-05-07 ENCOUNTER — Telehealth: Payer: Self-pay | Admitting: Internal Medicine

## 2021-05-07 MED ORDER — PREDNISONE 10 MG PO TABS: ORAL_TABLET | ORAL | 0 refills | Status: DC

## 2021-05-07 MED ORDER — DOXYCYCLINE HYCLATE 100 MG PO TABS: 100.0000 mg | ORAL_TABLET | Freq: Two times a day (BID) | ORAL | 0 refills | Status: DC

## 2021-05-07 NOTE — Telephone Encounter (Signed)
Patient calling in and states that her bronchitis has flared up starting two days ago. Patient states she has a bad productive cough with a clear/white color. No one around the Patient is sick and Patient has not been Covid test.  Patient is wheezing as well.   Patient wanting to have antibiotics and cough medication sent in. States Dr Darrick Huntsman has done this for her before. Patient declined an appointment or access nurse. Patient declined any testing or evaluation as she states she gets this every year.   Patient would like something sent in today before her family goes out of town and she has no one to pick this up for her.

## 2021-05-07 NOTE — Telephone Encounter (Signed)
Spoke with Patricia Fields to let her know that Dr. Darrick Huntsman sent in doxycycline and prednisone for her to take. Also scheduled her for virtual follow up next week. Patricia Fields gave a verbal understanding.

## 2021-05-07 NOTE — Progress Notes (Unsigned)
Doxycycline and prednisone sent to [facility] AS A ONE TIME COURTESY.   SHE CAN USE OTC ROBITUSSIN FOR COUGH,  THERE IS NOTHING COVERED BY HER INSURANCE FOR COUGH THAT CAN BE PRESCRIBED WITHOUT AN OFFICE VISIT

## 2021-05-11 ENCOUNTER — Telehealth (INDEPENDENT_AMBULATORY_CARE_PROVIDER_SITE_OTHER): Payer: Self-pay | Admitting: Internal Medicine

## 2021-05-11 ENCOUNTER — Telehealth: Payer: Self-pay | Admitting: Internal Medicine

## 2021-05-11 DIAGNOSIS — Z5329 Procedure and treatment not carried out because of patient's decision for other reasons: Secondary | ICD-10-CM

## 2021-05-11 NOTE — Telephone Encounter (Signed)
Patient called in to return your call,please call the patient at [phone].

## 2021-05-12 NOTE — Progress Notes (Signed)
Patient failed to keep scheduled appointment and will be charged a no show fee.   

## 2021-05-18 MED ORDER — BENZONATATE 200 MG PO CAPS: 200.0000 mg | ORAL_CAPSULE | Freq: Three times a day (TID) | ORAL | 1 refills | Status: DC | PRN

## 2021-05-18 NOTE — Telephone Encounter (Signed)
Spoke with pt to let her know that the medication has been sent in.   No show was charged.

## 2021-05-18 NOTE — Telephone Encounter (Signed)
Medication sent.  Please confirm that she is charged a No Show

## 2021-05-18 NOTE — Telephone Encounter (Signed)
Spoke with pt to let her know that we called her last week to do her virtual/telephone visit but no one answered. Pt apologized and stated that her phone must have been turned off. She stated that she is feeling much better after taking the antibiotics that were called in. She stated that she does still have a lingering cough and would like to see if tessalon pearls could be called in for her to take PRN.

## 2021-05-19 ENCOUNTER — Ambulatory Visit: Payer: Medicare (Managed Care) | Admitting: Pharmacist

## 2021-05-19 DIAGNOSIS — Z720 Tobacco use: Secondary | ICD-10-CM

## 2021-05-19 DIAGNOSIS — E782 Mixed hyperlipidemia: Secondary | ICD-10-CM

## 2021-05-19 DIAGNOSIS — I150 Renovascular hypertension: Secondary | ICD-10-CM

## 2021-05-19 DIAGNOSIS — J449 Chronic obstructive pulmonary disease, unspecified: Secondary | ICD-10-CM

## 2021-05-19 MED ORDER — VARENICLINE TARTRATE 0.5 MG PO TABS: ORAL_TABLET | ORAL | 5 refills | Status: DC

## 2021-05-19 NOTE — Chronic Care Management (AMB) (Signed)
Chronic Care Management Pharmacy Note  05/19/2021 Name:  Patricia Fields MRN:  [id number] DOB:  Dec 30, 1955  Subjective: Patricia Fields is an 65 y.o. year old female who is a primary patient of Derrel Nip, Aris Everts, MD.  The CCM team was consulted for assistance with disease management and care coordination needs.    Engaged with patient by telephone for follow up visit in response to provider referral for pharmacy case management and/or care coordination services.   Consent to Services:  The patient was given information about Chronic Care Management services, agreed to services, and gave verbal consent prior to initiation of services.  Please see initial visit note for detailed documentation.   Patient Care Team: Crecencio Mc, MD as PCP - General (Internal Medicine) De Hollingshead, RPH-CPP (Pharmacist)  Objective:  Lab Results  Component Value Date   CREATININE 1.08 02/09/2021   CREATININE 1.25 (H) 01/13/2021   CREATININE 1.61 (H) 11/26/2020   CREATININE 1.61 (H) 11/26/2020    Lab Results  Component Value Date   HGBA1C 6.5 01/13/2021   Last diabetic Eye exam:  Lab Results  Component Value Date/Time   HMDIABEYEEXA No Retinopathy 10/30/2018 12:00 AM    Last diabetic Foot exam: No results found for: HMDIABFOOTEX      Component Value Date/Time   CHOL 151 01/13/2021 1034   TRIG 136.0 01/13/2021 1034   HDL 41.40 01/13/2021 1034   CHOLHDL 4 01/13/2021 1034   VLDL 27.2 01/13/2021 1034   LDLCALC 83 01/13/2021 1034   LDLDIRECT 95.0 01/17/2020 1125    Hepatic Function Latest Ref Rng & Units 02/09/2021 02/09/2021 01/13/2021  Total Protein 6.0 - 8.3 g/dL 6.5 6.5 -  Albumin 3.5 - 5.2 g/dL 3.9 3.9 4.0  AST 0 - 37 U/L 11 11 -  ALT 0 - 35 U/L 13 13 -  Alk Phosphatase 39 - 117 U/L 101 101 -  Total Bilirubin 0.2 - 1.2 mg/dL 0.4 0.4 -  Bilirubin, Direct 0.0 - 0.3 mg/dL - 0.1 -    Lab Results  Component Value Date/Time   TSH 0.88 11/29/2017 03:03 PM   TSH 1.26 08/14/2014  09:34 AM    CBC Latest Ref Rng & Units 03/12/2018 11/29/2017 08/14/2014  WBC 4.0 - 10.5 K/uL 8.1 15.2(H) 8.5  Hemoglobin 12.0 - 15.0 g/dL 13.5 13.2 14.6  Hematocrit 36.0 - 46.0 % 39.5 38.9 43.1  Platelets 150.0 - 400.0 K/uL 240.0 279.0 293.0    Lab Results  Component Value Date/Time   VD25OH 27.47 (L) 11/06/2015 10:05 AM   VD25OH 19.94 (L) 08/14/2014 09:34 AM    Social History   Tobacco Use  Smoking Status Every Day   Packs/day: 0.50   Types: Cigarettes  Smokeless Tobacco Never   BP Readings from Last 3 Encounters:  01/13/21 140/70  10/30/20 (!) 148/78  10/16/20 (!) 180/92   Pulse Readings from Last 3 Encounters:  01/13/21 67  10/30/20 64  10/16/20 87   Wt Readings from Last 3 Encounters:  02/17/21 149 lb (67.6 kg)  01/13/21 149 lb (67.6 kg)  10/16/20 155 lb (70.3 kg)    Assessment: Review of patient past medical history, allergies, medications, health status, including review of consultants reports, laboratory and other test data, was performed as part of comprehensive evaluation and provision of chronic care management services.   SDOH:  (Social Determinants of Health) assessments and interventions performed:  SDOH Interventions    Flowsheet Row Most Recent Value  SDOH Interventions   SDOH  Interventions for the Following Domains Tobacco  Tobacco Interventions Cessation Materials Given and Reviewed       CCM Care Plan  Allergies  Allergen Reactions   Bee Pollen Anaphylaxis   Bee Venom Shortness Of Breath   Tomato Hives    Medications Reviewed Today     Reviewed by De Hollingshead, RPH-CPP (Pharmacist) on 05/19/21 at 1321  Med List Status: <None>   Medication Order Taking? Sig Documenting Provider Last Dose Status Informant  albuterol (PROVENTIL) (2.5 MG/3ML) 0.083% nebulizer solution [id number] Yes Take 3 mLs (2.5 mg total) by nebulization every 8 (eight) hours as needed for wheezing or shortness of breath. Crecencio Mc, MD Taking Active    Albuterol Sulfate (PROAIR RESPICLICK) 294 (90 Base) MCG/ACT AEPB [id number] Yes Inhale 1 puff into the lungs every 8 (eight) hours as needed. For wheezing Crecencio Mc, MD Taking Active   aspirin EC 81 MG tablet [id number] Yes Take 1 tablet (81 mg total) by mouth daily. Crecencio Mc, MD Taking Active   atorvastatin (LIPITOR) 80 MG tablet [id number] Yes Take 1 tablet (80 mg total) by mouth daily. Crecencio Mc, MD Taking Active   benzonatate (TESSALON) 200 MG capsule [id number] No Take 1 capsule (200 mg total) by mouth 3 (three) times daily as needed for cough.  Patient not taking: Reported on 05/19/2021   Crecencio Mc, MD Not Taking Active   betamethasone acetate-betamethasone sodium phosphate (CELESTONE) injection 3 mg [id number]   Edrick Kins, DPM  Active   blood glucose meter kit and supplies [id number] Yes Use to check blood sugars once daily.  (FOR ICD-10 E10.9, E11.9). Crecencio Mc, MD Taking Active   clopidogrel (PLAVIX) 75 MG tablet [id number] Yes Take 1 tablet by mouth daily. [provider] Taking Active   doxycycline (VIBRA-TABS) 100 MG tablet [id number] Yes Take 1 tablet (100 mg total) by mouth 2 (two) times daily. Crecencio Mc, MD Taking Active   ipratropium-albuterol (DUONEB) 0.5-2.5 (3) MG/3ML nebulizer solution 3 mL [id number]   Burnard Hawthorne, FNP  Active   Lancets [facility] DELICA PLUS [id number]) MISC [id number]  USE WITH DEVICE TO TEST  ONCE DAILY Crecencio Mc, MD  Active   metoprolol succinate (TOPROL-XL) 25 MG 24 hr tablet [id number] Yes TAKE 1 TABLET BY MOUTH EVERY DAY WITH OR IMMEDIATELY FOLLOWING A MEAL Crecencio Mc, MD Taking Active   omega-3 acid ethyl esters (LOVAZA) 1 g capsule [id number] Yes Take 2 capsules by mouth 2 (two) times daily. [provider] Taking Active   ondansetron (ZOFRAN ODT) 4 MG disintegrating tablet [id number] No Take 1 tablet (4 mg total) by mouth every 8 (eight) hours as needed for nausea or vomiting.  Patient not  taking: Reported on 05/19/2021   Crecencio Mc, MD Not Taking Active   [facility] ULTRA test strip [id number]  USE WITH METER TO CHECK  BLOOD SUGAR ONCE DAILY. Crecencio Mc, MD  Active   PARoxetine (PAXIL) 10 MG tablet [id number] Yes TAKE 1 TABLET BY MOUTH EVERY MORNING Crecencio Mc, MD Taking Active   predniSONE (DELTASONE) 10 MG tablet [id number] Yes 6 tablets on Day 1 , then reduce by 1 tablet daily until gone Crecencio Mc, MD Taking Active   telmisartan (MICARDIS) 20 MG tablet [id number] Yes TAKE 1 TABLET(20 MG) BY MOUTH DAILY Crecencio Mc, MD Taking Active   Tiotropium Bromide Monohydrate (SPIRIVA RESPIMAT) 2.5 MCG/ACT AERS [id number] Yes Inhale 2 puffs into the lungs daily. Deborra Medina  L, MD Taking Active   triamterene-hydrochlorothiazide (DYAZIDE) 37.5-25 MG capsule [id number] Yes TAKE 1 CAPSULE BY MOUTH DAILY Crecencio Mc, MD Taking Active             Patient Active Problem List   Diagnosis Date Noted   Intermittent claudication ([place]) 01/15/2021   Renal hypertension 11/29/2020   Meniere's disease 04/28/2020   Breast calcifications on mammogram 10/25/2018   Elevated alkaline phosphatase level 10/17/2018   Diabetic nephropathy with proteinuria ([place]) 10/17/2018   Headache 07/14/2018   Preoperative clearance 11/30/2017   DM (diabetes mellitus), type 2 with peripheral vascular complications ([place]) 03/00/9233   Breast cancer screening 10/15/2016   Cataract of both eyes 12/31/2014   Long-term use of high-risk medication 08/19/2014   Hypovitaminosis D 08/18/2014   Encounter for preventive health examination 08/16/2014   Pain in joint, shoulder region 08/16/2014   Tobacco abuse counseling 08/16/2014   Gait instability 04/03/2014   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   Hearing loss, 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/2013   History of shingles 08/30/2013   Generalized anxiety disorder 05/29/2013   Obesity (BMI 30.0-34.9) 03/19/2012   Screening for colon  cancer 03/19/2012   History of nephrolithiasis    Interstitial cystitis    Renovascular hypertension    Hyperlipidemia    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ervical region    Tobacco abuse 07/25/2011    Immunization History  Administered Date(s) Administered   Fluad Quad(high Dose 65+) 10/16/2020   Influenza Split 07/25/2011, 09/27/2012   Influenza,inj,Quad PF,6+ Mos 05/29/2013, 08/14/2014, 11/06/2015, 09/19/2016, 07/12/2018   PFIZER(Purple Top)SARS-COV-2 Vaccination 11/29/2019, 12/20/2019   Pneumococcal Conjugate-13 05/29/2013   Pneumococcal Polysaccharide-23 08/14/2014   Tdap 07/25/2011   Zoster, Live 08/20/2014    Conditions to be addressed/monitored: CAD and HTN  Care Plan : PharmD - Medication Management  Updates made by De Hollingshead, RPH-CPP since 05/19/2021 12:00 AM     Problem: Hypertension, Vestibular Neuronitis, Tobacco Abuse      Long-Range Goal: Disease Progression Prevention   Start Date: 11/19/2020  Recent Progress: On track  Priority: High  Note:   Current Barriers:  Unable to independently afford treatment regimen Unable to achieve control of tobacco use   Pharmacist Clinical Goal(s):  Over the next 90 days, patient will achieve adherence to monitoring guidelines and medication adherence to achieve therapeutic efficacy through collaboration with PharmD and provider.   Interventions: 1:1 collaboration with Crecencio Mc, MD regarding development and update of comprehensive plan of care as evidenced by provider attestation and co-signature Inter-disciplinary care team collaboration (see longitudinal plan of care) Comprehensive medication review performed; medication list updated in electronic medical record  SDOH: Full Medicare Extra Help.  Hypertension/Vestibular Disease: Around goal <140/80; current treatment: triamterene/HCTZ 37.5/25 mg daily, telmisartan 20 mg QPM, metoprolol succinate 25 mg QPM Vertigo related nausea:  ondansetron 4 mg PRN Current home readings: has not checked recently Continue current regimen at this time. Encouraged occasional home BP checks.   Hyperlipidemia and ASCVD risk reduction: Improved but uncontrolled; current treatment: atorvastatin 80 mg daily - recently increased in May, but per review today still has a bottle of 40 mg; omega 3 fatty acids 2 g BID Antiplatelet regimen: aspirin 81 mg daily, clopidogrel 75 mg daily; notes she needs to pick up another bottle of aspirin Continue current regimen at this time. Contacted pharmacy to request that they cancel atorvastatin 40 mg script. Sending refill today to promote adherence.   Chronic Obstructive Pulmonary Disease: Improved per patient report; current  treatment: Spiriva Respimat 2.5 mcg 2 puffs daily; albuterol HFA PRN- reports still using daily, not related to symptoms No hx PFTs on file Reviewed maintenance vs rescue therapy again. Continue Spiriva 2.5 mcg 2 puffs QAM. Reviewed again that she can use albuterol HFA rescue inhaler for symptoms (SOB, cough, wheezing). She verbalized understanding.    Depression/Anxiety Controlled per patient report; current regimen: paroxetine 10 mg QAM Previously recommended to continue current regimen at this time.  Tobacco Abuse: 0.5 packs per day; reports that she started varenicline, but once she titrated to 1 mg BID, she experienced diarrhea. No diarrhea on the days she was on 0.5 mg BID. Asks about vaping to help quit, as a friend started vaping to quit smoking and is now working on quitting vaping Previous quit attempts: unsuccessful using varenicline, but she doesn't remember how long she took this for or if she took consistently Triggers to smoke: stress Motivation to quit smoking: health Discussed that we cannot recommend vaping as a strategy for tobacco cessation due to lack of long term safety data. Advised that the important thing is a strategy to help her quit all tobacco products.  Discussed retrial of varenicline at lower dose of 0.5 mg BID to see if tolerated. Patient amenable. Start varenicline 0.5 mg daily for 3 days then increase to 0.5 mg twice daily with meals. Discussed that she can also incorporate use of nicotine replacement therapy to assist in tapering off nicotine products. She will investigate what over the counter benefits she has through her insurance plan to support purchasing nicotine replacement therapy.  Provided motivational interviewing regarding strategies of addressing stress other than nicotine.   Patient Goals/Self-Care Activities Over the next 90 days, patient will:  - take medications as prescribed check blood pressure periodically, document, and provide at future appointments collaborate with provider on medication access solutions  Follow Up Plan: Telephone follow up appointment with care management team member scheduled for: ~ 4 weeks      Medication Assistance: None required.  Patient affirms current coverage meets needs.  Patient's preferred pharmacy is:  [id number] [address], [place] [address] Phone: [phone] Fax: [phone]  [id number] - [place], [place] - [address] [place] [address] Phone: [phone] Fax: [phone]  Follow Up:  Patient agrees to Care Plan and Follow-up.  Plan: Telephone follow up appointment with care management team member scheduled for:  ~ 4 weeks  Catie Darnelle Maffucci, PharmD, [place], [place] Clinical Pharmacist [employer] at [employer] [phone]

## 2021-05-19 NOTE — Patient Instructions (Signed)
Patricia Fields,   It was great talking with you today!  Call the [place] Quitline for support quitting smoking. They may also be able to provide you free starting supplies of nicotine replacement products.   You should be on atorvastatin 80 mg. You can take 2 tablets of the 40 mg dose every day to complete that supply.   Let's try a lower dose of varenicline. Start 0.5 mg once daily for 3 days, then increase to twice daily. Take this with food on your stomach. You can use nicotine patches and/or nicotine lozenges to help replace cigarettes throughout the day.   We recommend you get the influenza vaccine for this season.   We recommend you get the updated bivalent COVID-19 booster, at least 2 months after any prior doses. You may consider delaying a booster dose by 3 months from a prior episode of COVID-19 per the CDC.   You can find pharmacies that have this formulation in stock at [email or web address].    Take care!  Catie Feliz Beam, PharmD  Visit Information  PATIENT GOALS:  Goals Addressed               This Visit's Progress     Patient Stated     Medication Management (pt-stated)        Patient Goals/Self-Care Activities Over the next 90 days, patient will:  - take medications as prescribed check blood pressure periodically, document, and provide at future appointments collaborate with provider on medication access solutions        The patient verbalized understanding of instructions, educational materials, and care plan provided today and agreed to receive a mailed copy of patient instructions, educational materials, and care plan.   Plan: Telephone follow up appointment with care management team member scheduled for:  ~ 4 weeks  Catie Feliz Beam, PharmD, [place], CPP Clinical Pharmacist [employer] at [employer] [phone]

## 2021-05-21 DIAGNOSIS — E782 Mixed hyperlipidemia: Secondary | ICD-10-CM

## 2021-05-21 DIAGNOSIS — J449 Chronic obstructive pulmonary disease, unspecified: Secondary | ICD-10-CM | POA: Diagnosis not present

## 2021-05-21 DIAGNOSIS — I150 Renovascular hypertension: Secondary | ICD-10-CM

## 2021-06-21 ENCOUNTER — Telehealth: Payer: Self-pay | Admitting: Pharmacist

## 2021-06-21 ENCOUNTER — Ambulatory Visit (INDEPENDENT_AMBULATORY_CARE_PROVIDER_SITE_OTHER): Payer: Medicare (Managed Care) | Admitting: Pharmacist

## 2021-06-21 DIAGNOSIS — E1122 Type 2 diabetes mellitus with diabetic chronic kidney disease: Secondary | ICD-10-CM | POA: Diagnosis not present

## 2021-06-21 DIAGNOSIS — E782 Mixed hyperlipidemia: Secondary | ICD-10-CM | POA: Diagnosis not present

## 2021-06-21 DIAGNOSIS — J449 Chronic obstructive pulmonary disease, unspecified: Secondary | ICD-10-CM | POA: Diagnosis not present

## 2021-06-21 DIAGNOSIS — Z1231 Encounter for screening mammogram for malignant neoplasm of breast: Secondary | ICD-10-CM

## 2021-06-21 DIAGNOSIS — I10 Essential (primary) hypertension: Secondary | ICD-10-CM | POA: Diagnosis not present

## 2021-06-21 DIAGNOSIS — Z720 Tobacco use: Secondary | ICD-10-CM

## 2021-06-21 MED ORDER — VARENICLINE TARTRATE 0.5 MG PO TABS: ORAL_TABLET | ORAL | 5 refills | Status: DC
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
## 2021-06-21 NOTE — Patient Instructions (Signed)
Ms. Woehrle,   It was great talking with you today!  1) Schedule your yearly diabetic eye exam. Have them fax the results to our office at [phone].  2) Get your yearly flu shot at your local pharmacy! 3) Get the updated COVID booster at the local pharmacy! 4) Call [facility] to schedule your mammogram and your bone density scan at [phone] 5) The Shingles vaccine should have a $0 copay in 2023, so you can wait to pursue that then. You can get that at your local pharmacy.   Pick up the varenicline (Chantix) at [facility]. Take 1 tablet (0.5 mg) once daily for 3 days, then increase to twice daily. Make sure you take this with food on your stomach!  Take care!  Catie Feliz Beam, PharmD  Visit Information  The patient verbalized understanding of instructions, educational materials, and care plan provided today and agreed to receive a mailed copy of patient instructions, educational materials, and care plan.   Plan: Telephone follow up appointment with care management team member scheduled for:  4 weeks  Catie Feliz Beam, PharmD, [place], CPP Clinical Pharmacist [facility] at [facility] [phone]

## 2021-06-21 NOTE — Addendum Note (Signed)
Addended by: Sherlene Shams on: 06/21/2021 04:34 PM   Modules accepted: Orders

## 2021-06-21 NOTE — Telephone Encounter (Signed)
Talked about Care Gaps today. DEXA ordered, patient never scheduled. Amenable to scheduling both DEXA and mammogram, though no mammogram order in.   Can you place mammogram order? Patient will call [facility] to schedule.

## 2021-06-28 ENCOUNTER — Other Ambulatory Visit: Payer: Self-pay | Admitting: Internal Medicine

## 2021-07-20 ENCOUNTER — Telehealth: Payer: 59

## 2021-07-20 ENCOUNTER — Telehealth: Payer: Self-pay | Admitting: Pharmacist

## 2021-07-20 NOTE — Telephone Encounter (Signed)
  Chronic Care Management   Note  07/20/2021 Name: Patricia Fields MRN: [id number] DOB: 05-14-1956   Attempted to contact patient for scheduled appointment for medication management support. Unable to leave voicemail.   Plan: - If I do not hear back from the patient by end of business today, will collaborate with Care Guide to outreach to schedule follow up with me  Catie Feliz Beam, PharmD, [place], CPP Clinical Pharmacist [facility] at [facility] [phone]

## 2021-07-28 ENCOUNTER — Encounter: Payer: Self-pay | Admitting: Internal Medicine

## 2021-07-28 ENCOUNTER — Other Ambulatory Visit: Payer: Self-pay

## 2021-07-28 ENCOUNTER — Other Ambulatory Visit: Payer: Self-pay | Admitting: Internal Medicine

## 2021-07-28 ENCOUNTER — Ambulatory Visit (INDEPENDENT_AMBULATORY_CARE_PROVIDER_SITE_OTHER): Payer: Medicare (Managed Care) | Admitting: Internal Medicine

## 2021-07-28 VITALS — BP 186/90 | HR 96 | Temp 96.4°F | Ht 58.5 in | Wt 143.0 lb

## 2021-07-28 DIAGNOSIS — E1151 Type 2 diabetes mellitus with diabetic peripheral angiopathy without gangrene: Secondary | ICD-10-CM

## 2021-07-28 DIAGNOSIS — E782 Mixed hyperlipidemia: Secondary | ICD-10-CM

## 2021-07-28 DIAGNOSIS — I150 Renovascular hypertension: Secondary | ICD-10-CM

## 2021-07-28 DIAGNOSIS — Z716 Tobacco abuse counseling: Secondary | ICD-10-CM | POA: Diagnosis not present

## 2021-07-28 DIAGNOSIS — E1121 Type 2 diabetes mellitus with diabetic nephropathy: Secondary | ICD-10-CM

## 2021-07-28 LAB — COMPREHENSIVE METABOLIC PANEL
ALT: 10 U/L (ref 0–35)
AST: 11 U/L (ref 0–37)
Albumin: 3.8 g/dL (ref 3.5–5.2)
Alkaline Phosphatase: 85 U/L (ref 39–117)
BUN: 17 mg/dL (ref 6–23)
CO2: 26 mEq/L (ref 19–32)
Calcium: 9.3 mg/dL (ref 8.4–10.5)
Chloride: 108 mEq/L (ref 96–112)
Creatinine, Ser: 0.97 mg/dL (ref 0.40–1.20)
GFR: 60.97 mL/min (ref >60.00)
Glucose, Bld: 99 mg/dL (ref 70–99)
Potassium: 3.8 mEq/L (ref 3.5–5.1)
Sodium: 141 mEq/L (ref 135–145)
Total Bilirubin: 0.6 mg/dL (ref 0.2–1.2)
Total Protein: 6.5 g/dL (ref 6.0–8.3)

## 2021-07-28 LAB — HEMOGLOBIN A1C: Hgb A1c MFr Bld: 6 % (ref 4.6–6.5)

## 2021-07-28 LAB — LIPID PANEL
Cholesterol: 145 mg/dL (ref 0–200)
HDL: 49.8 mg/dL (ref >39.00)
LDL Cholesterol: 79 mg/dL (ref 0–99)
NonHDL: 95.28
Total CHOL/HDL Ratio: 3
Triglycerides: 80 mg/dL (ref 0.0–149.0)
VLDL: 16 mg/dL (ref 0.0–40.0)

## 2021-07-28 MED ORDER — TELMISARTAN 40 MG PO TABS: 40.0000 mg | ORAL_TABLET | Freq: Every day | ORAL | 1 refills | Status: DC

## 2021-07-28 MED ORDER — ZOSTER VAC RECOMB ADJUVANTED 50 MCG/0.5ML IM SUSR: 0.5000 mL | Freq: Once | INTRAMUSCULAR | 1 refills | Status: AC

## 2021-07-28 MED ORDER — TETANUS-DIPHTH-ACELL PERTUSSIS 5-2.5-18.5 LF-MCG/0.5 IM SUSY: 0.5000 mL | PREFILLED_SYRINGE | Freq: Once | INTRAMUSCULAR | 0 refills | Status: AC

## 2021-07-28 NOTE — Assessment & Plan Note (Signed)
Reduced proteinuria with use of telmisartan  Advised to increase dose to 40 mg daily given eleavated readings.  And  s/p renal artery stenting   Lab Results  Component Value Date   MICROALBUR 19.5 (H) 10/16/2020   MICROALBUR 65.3 (H) 01/17/2020

## 2021-07-28 NOTE — Assessment & Plan Note (Signed)
ADVISED to increase telmisartan dose to 40 mg daily  And continue Toprol XL 25 mg and maxzide 37.5/325 mg daily

## 2021-07-28 NOTE — Patient Instructions (Addendum)
YOU LOOK FANTASTIC !  I AM SO  SO PROUD OF YOU !    PLEASE INCREASE DOSE OF TELMISARTAN TO 40 MG DAILY STARTING TODAY  CONTINUE METOPROLOL 25 MG DAILY  A NDTRIAMTERENE   37.5 MG DAILY   CHECK BP DAILY FOR ONE WEEK AND LET ME KNOW THE READINGS  IF BP FALLS BELOW 110/  70,  REDUCE THE DOSE OF TELMISARTAN BACK  TO 20 MG DAILY     The Tdap and Shingrix vaccines will be COVERED BY MEDICARE if you get them at your pharmacy after January 1

## 2021-07-28 NOTE — Assessment & Plan Note (Signed)
She has reduced daily consumption to half pack but no farther  .  Encouraged to continue gradual reduction  Given her PVD/CAD and greatly increased risk of CVA /AMI

## 2021-07-28 NOTE — Progress Notes (Signed)
Subjective:  Patient ID: Patricia Fields, female    DOB: 12-May-1956  Age: 65 y.o. MRN: [id number]  CC: The primary encounter diagnosis was DM (diabetes mellitus), type 2 with peripheral vascular complications ([place]). Diagnoses of Mixed hyperlipidemia, Renovascular hypertension, Tobacco abuse counseling, and Diabetic nephropathy with proteinuria ([place]) were also pertinent to this visit.  HPI [name] presents for   follow up on diabetes,tobacco aubse and hypertension  Chief Complaint  Patient presents with   Follow-up    Diabetes, hypertension    This visit occurred during the SARS-CoV-2 public health emergency.  Safety protocols were in place, including screening questions prior to the visit, additional usage of staff PPE, and extensive cleaning of exam room while observing appropriate contact time as indicated for disinfecting solutions.     T2DM:  She  feels generally well,  IS WALKING  regularly and  LOSING WEIGHT BECAUSE OF A CHANGE IN DIET  shehas lost 30 lbs since  July 2021 by eating more salad. She is eating lots of asian salads (primarily cabbage, sesame seeds,  etc)  with a protein source added.  checking  blood sugars less than once daily at variable times, due to house being in repairs, cannot find her glucometer and cannot remember last check .   Denies any recent hypoglyemic symptoms.  Taking   NO medications for diabetes but taking telmisartan  atorvastatin and fish oil as directed. Following a carbohydrate modified diet 7 days per week. Denies numbness, burning and tingling of extremities. Appetite is good.    Hypertension: patient used to check blood pressure twice weekly at home.  Readings have not been done in the past month but the last reading was > 140/80 at rest . Patient is following a reduce dsalt diet most days and is taking medications as prescribed   but did not take them today yet or before yesterdays' doctor visit   Tobacco abuse:  has reduced cigarettess to half  pack daily .  Saw vascular surgeon yesterday   Outpatient Medications Prior to Visit  Medication Sig Dispense Refill   albuterol (PROVENTIL) (2.5 MG/3ML) 0.083% nebulizer solution Take 3 mLs (2.5 mg total) by nebulization every 8 (eight) hours as needed for wheezing or shortness of breath. 150 mL 1   Albuterol Sulfate (PROAIR RESPICLICK) 350 (90 Base) MCG/ACT AEPB Inhale 1 puff into the lungs every 8 (eight) hours as needed. For wheezing 3 each 0   aspirin EC 81 MG tablet Take 1 tablet (81 mg total) by mouth daily. 90 tablet 2   atorvastatin (LIPITOR) 40 MG tablet Take 40 mg by mouth daily.     blood glucose meter kit and supplies Use to check blood sugars once daily.  (FOR ICD-10 E10.9, E11.9). 1 each 0   clopidogrel (PLAVIX) 75 MG tablet Take 1 tablet by mouth daily.     Lancets (ONETOUCH DELICA PLUS [id number]) MISC USE WITH DEVICE TO TEST  ONCE DAILY 100 each 3   metoprolol succinate (TOPROL-XL) 25 MG 24 hr tablet TAKE 1 TABLET BY MOUTH EVERY DAY WITH OR IMMEDIATELY FOLLOWING A MEAL 90 tablet 2   omega-3 acid ethyl esters (LOVAZA) 1 g capsule Take 2 capsules by mouth 2 (two) times daily.     ondansetron (ZOFRAN ODT) 4 MG disintegrating tablet Take 1 tablet (4 mg total) by mouth every 8 (eight) hours as needed for nausea or vomiting. 60 tablet 2   ONETOUCH ULTRA test strip USE WITH METER TO CHECK  BLOOD  SUGAR ONCE DAILY. 100 strip 3   PARoxetine (PAXIL) 10 MG tablet TAKE 1 TABLET BY MOUTH EVERY MORNING 90 tablet 1   Tiotropium Bromide Monohydrate (SPIRIVA RESPIMAT) 2.5 MCG/ACT AERS Inhale 2 puffs into the lungs daily. 12 g 1   triamterene-hydrochlorothiazide (DYAZIDE) 37.5-25 MG capsule TAKE 1 CAPSULE BY MOUTH DAILY 90 capsule 1   telmisartan (MICARDIS) 20 MG tablet TAKE 1 TABLET(20 MG) BY MOUTH DAILY 90 tablet 0   varenicline (CHANTIX) 0.5 MG tablet Take 1 tablet by mouth daily for 3 days, then increase to 1 tablet twice daily (Patient not taking: Reported on 07/28/2021) 56 tablet 5    atorvastatin (LIPITOR) 80 MG tablet Take 1 tablet (80 mg total) by mouth daily. (Patient not taking: Reported on 07/28/2021) 90 tablet 3   Facility-Administered Medications Prior to Visit  Medication Dose Route Frequency Provider Last Rate Last Admin   betamethasone acetate-betamethasone sodium phosphate (CELESTONE) injection 3 mg  3 mg Intramuscular Once Evans, Brent M, DPM       ipratropium-albuterol (DUONEB) 0.5-2.5 (3) MG/3ML nebulizer solution 3 mL  3 mL Nebulization Q6H Burnard Hawthorne, FNP        Review of Systems;  Patient denies headache, fevers, malaise, unintentional weight loss, skin rash, eye pain, sinus congestion and sinus pain, sore throat, dysphagia,  hemoptysis , cough, dyspnea, wheezing, chest pain, palpitations, orthopnea, edema, abdominal pain, nausea, melena, diarrhea, constipation, flank pain, dysuria, hematuria, urinary  Frequency, nocturia, numbness, tingling, seizures,  Focal weakness, Loss of consciousness,  Tremor, insomnia, depression, anxiety, and suicidal ideation.      Objective:  BP (!) 186/90 (BP Location: Left Arm, Patient Position: Sitting, Cuff Size: Normal)   Pulse 96   Temp (!) 96.4 F (35.8 C) (Temporal)   Ht 4' 10.5" (1.486 m)   Wt 143 lb (64.9 kg)   SpO2 94%   BMI 29.38 kg/m   BP Readings from Last 3 Encounters:  07/28/21 (!) 186/90  01/13/21 140/70  10/30/20 (!) 148/78    Wt Readings from Last 3 Encounters:  07/28/21 143 lb (64.9 kg)  02/17/21 149 lb (67.6 kg)  01/13/21 149 lb (67.6 kg)    General appearance: alert, cooperative and appears stated age Ears: normal TM's and external ear canals both ears Throat: lips, mucosa, and tongue normal; teeth and gums normal Neck: no adenopathy, no carotid bruit, supple, symmetrical, trachea midline and thyroid not enlarged, symmetric, no tenderness/mass/nodules Back: symmetric, no curvature. ROM normal. No CVA tenderness. Lungs: clear to auscultation bilaterally Heart: regular rate and  rhythm, S1, S2 normal, no murmur, click, rub or gallop Abdomen: soft, non-tender; bowel sounds normal; no masses,  no organomegaly Pulses: 2+ and symmetric Skin: Skin color, texture, turgor normal. No rashes or lesions Lymph nodes: Cervical, supraclavicular, and axillary nodes normal.  Lab Results  Component Value Date   HGBA1C 6.5 01/13/2021   HGBA1C 6.1 10/16/2020   HGBA1C 6.3 01/17/2020    Lab Results  Component Value Date   CREATININE 1.08 02/09/2021   CREATININE 1.25 (H) 01/13/2021   CREATININE 1.61 (H) 11/26/2020   CREATININE 1.61 (H) 11/26/2020    Lab Results  Component Value Date   WBC 8.1 03/12/2018   HGB 13.5 03/12/2018   HCT 39.5 03/12/2018   PLT 240.0 03/12/2018   GLUCOSE 90 02/09/2021   CHOL 151 01/13/2021   TRIG 136.0 01/13/2021   HDL 41.40 01/13/2021   LDLDIRECT 95.0 01/17/2020   LDLCALC 83 01/13/2021   ALT 13 02/09/2021   ALT 13  02/09/2021   AST 11 02/09/2021   AST 11 02/09/2021   NA 141 02/09/2021   K 3.8 02/09/2021   CL 107 02/09/2021   CREATININE 1.08 02/09/2021   BUN 18 02/09/2021   CO2 27 02/09/2021   TSH 0.88 11/29/2017   HGBA1C 6.5 01/13/2021   MICROALBUR 19.5 (H) 10/16/2020    MM CLIP PLACEMENT RIGHT  Result Date: 10/23/2019 CLINICAL DATA:  Post stereotactic guided biopsy of calcifications within the upper inner posterior right breast. EXAM: DIAGNOSTIC RIGHT MAMMOGRAM POST STEREOTACTIC BIOPSY COMPARISON:  Previous exam(s). FINDINGS: Mammographic images were obtained following stereotactic guided biopsy of calcifications within the upper inner posterior right breast. A coil shaped biopsy marking clip is present and located approximately 0.9 cm superior to the biopsied calcifications within the upper inner posterior right breast. IMPRESSION: Coil shaped biopsy marking clip located 0.9 cm superior to the biopsied calcifications within the upper inner posterior right breast. Final Assessment: Post Procedure Mammograms for Marker Placement  Electronically Signed   By: Everlean Alstrom M.D.   On: 10/23/2019 13:35   MM RT BREAST BX W LOC DEV 1ST LESION IMAGE BX SPEC STEREO GUIDE  Addendum Date: 10/29/2019   ADDENDUM REPORT: 10/28/2019 11:51 ADDENDUM: PATHOLOGY revealed: A. BREAST, RIGHT UPPER INNER QUADRANT, POSTERIOR; STEREOTACTIC CORE BIOPSY: - BENIGN MAMMARY PARENCHYMA WITH FIBROADENOMATOID CHANGES AND ASSOCIATED COARSE CALCIFICATIONS. - NEGATIVE FOR ATYPICAL PROLIFERATIVE BREAST DISEASE. Pathology results are CONCORDANT with imaging findings, per Dr. Everlean Alstrom. Multiple attempts to contact patient were unsuccessful. Notified patient's provider office, on 10/25/2019, requesting they contact patient with biopsy results. Recommendation: Return to annual bilateral screening mammogram (February 2022). Addendum by Electa Sniff RN on 10/28/2019. Electronically Signed   By: Everlean Alstrom M.D.   On: 10/28/2019 11:51   Result Date: 10/29/2019 CLINICAL DATA:  65 year old female with indeterminate calcifications in the upper inner posterior right breast. EXAM: RIGHT BREAST STEREOTACTIC CORE NEEDLE BIOPSY COMPARISON:  Previous exams. FINDINGS: The patient and I discussed the procedure of stereotactic-guided biopsy including benefits and alternatives. We discussed the high likelihood of a successful procedure. We discussed the risks of the procedure including infection, bleeding, tissue injury, clip migration, and inadequate sampling. Informed written consent was given. The usual time out protocol was performed immediately prior to the procedure. Using sterile technique and 1% Lidocaine as local anesthetic, under stereotactic guidance, a 9 gauge vacuum assisted device was used to perform core needle biopsy of calcifications in the upper inner posterior right breast using a superior to inferior approach. Specimen radiograph was performed showing the presence of calcifications. Specimens with calcifications are identified for pathology. Lesion quadrant: Upper  inner At the conclusion of the procedure, coil shaped tissue marker clip was deployed into the biopsy cavity. Follow-up 2-view mammogram was performed and dictated separately. IMPRESSION: Stereotactic-guided biopsy of calcifications in the upper inner posterior right breast. No apparent complications. Electronically Signed: By: Everlean Alstrom M.D. On: 10/23/2019 13:25    Assessment & Plan:   Problem List Items Addressed This Visit     Tobacco abuse counseling    She has reduced daily consumption to half pack but no farther  .  Encouraged to continue gradual reduction  Given her PVD/CAD and greatly increased risk of CVA /AMI      Renovascular hypertension    ADVISED to increase telmisartan dose to 40 mg daily  And continue Toprol XL 25 mg and maxzide 37.5/325 mg daily      Relevant Medications   atorvastatin (LIPITOR) 40 MG tablet  telmisartan (MICARDIS) 40 MG tablet   Hyperlipidemia   Relevant Medications   atorvastatin (LIPITOR) 40 MG tablet   telmisartan (MICARDIS) 40 MG tablet   Other Relevant Orders   Lipid Profile   DM (diabetes mellitus), type 2 with peripheral vascular complications ([facility]) - Primary   Relevant Medications   atorvastatin (LIPITOR) 40 MG tablet   telmisartan (MICARDIS) 40 MG tablet   Other Relevant Orders   HgB A1c   Comp Met (CMET)   Diabetic nephropathy with proteinuria ([facility])    Reduced proteinuria with use of telmisartan  Advised to increase dose to 40 mg daily given eleavated readings.  And  s/p renal artery stenting   Lab Results  Component Value Date   MICROALBUR 19.5 (H) 10/16/2020   MICROALBUR 65.3 (H) 01/17/2020           Relevant Medications   atorvastatin (LIPITOR) 40 MG tablet   telmisartan (MICARDIS) 40 MG tablet    I provided  30 minutes of  face-to-face time during this encounter reviewing patient's current problems and past surgeries, labs and imaging studies, providing counseling on the above mentioned problems , and  coordination  of care .   Follow-up: Return in about 3 months (around 10/26/2021).   Crecencio Mc, MD

## 2021-07-30 NOTE — Addendum Note (Signed)
Addended by: Sherlene Shams on: 07/30/2021 01:26 PM   Modules accepted: Orders

## 2021-08-27 ENCOUNTER — Telehealth: Payer: 59

## 2021-08-27 ENCOUNTER — Telehealth: Payer: Self-pay | Admitting: Pharmacist

## 2021-08-27 NOTE — Telephone Encounter (Signed)
°  Chronic Care Management   Note  08/27/2021 Name: Patricia Fields MRN: [id number] DOB: [birth year]   Attempted to contact patient for scheduled appointment for medication management support. Left HIPAA compliant message for patient to return my call at their convenience.    Plan: - If I do not hear back from the patient by end of business today, will collaborate with Care Guide to outreach to schedule follow up with me   Catie Darnelle Maffucci, PharmD, [place], [place] Pharmacist [employer] at [employer] [phone]

## 2021-09-27 ENCOUNTER — Telehealth: Payer: Self-pay | Admitting: Internal Medicine

## 2021-09-27 MED ORDER — ATORVASTATIN CALCIUM 40 MG PO TABS: 40.0000 mg | ORAL_TABLET | Freq: Every day | ORAL | 1 refills | Status: DC

## 2021-09-27 NOTE — Telephone Encounter (Signed)
Spoke with pt and she stated that the medication she needs refilled is the Atorvastatin. Medication has been refilled.

## 2021-09-27 NOTE — Telephone Encounter (Signed)
Patient called in was trying to get a refill for Telmisartan 40 mg and [facility] advise patient that we have cancel it and that patient need to call doctor to see why the refill was cancel. Patient needs medication please call patient @ [phone]

## 2021-10-05 ENCOUNTER — Telehealth: Payer: 59

## 2021-10-05 ENCOUNTER — Telehealth: Payer: Self-pay | Admitting: Pharmacist

## 2021-10-05 NOTE — Telephone Encounter (Signed)
°  Chronic Care Management   Note  10/05/2021 Name: Patricia Fields MRN: [id number] DOB: 11/01/1955   Attempted to contact patient for scheduled appointment for medication management support. Left HIPAA compliant message for patient to return my call at their convenience.    Plan: - 3 consecutive unsuccessful outreach attempts. Closing CCM case.   Catie Feliz Beam, PharmD, [place], CPP Clinical Pharmacist [facility] at [facility] [phone]

## 2021-10-26 ENCOUNTER — Ambulatory Visit: Payer: Self-pay | Admitting: Pharmacist

## 2021-10-26 NOTE — Chronic Care Management (AMB) (Signed)
?  Chronic Care Management  ? ?Note ? ?10/26/2021 ?Name: Patricia Fields MRN: [id number] DOB: June 24, 1956 ? ? ? ?Closing pharmacy CCM case at this time. Patient has clinic contact information for future questions or concerns.  ? ?Catie Feliz Beam, PharmD, [place], CPP ?Clinical Pharmacist ?[employer] officer at [employer] ?[phone] ? ?

## 2021-11-04 ENCOUNTER — Other Ambulatory Visit: Payer: Self-pay

## 2021-11-04 MED ORDER — TRIAMTERENE-HCTZ 37.5-25 MG PO CAPS: 1.0000 | ORAL_CAPSULE | Freq: Every day | ORAL | 1 refills | Status: DC

## 2021-11-12 ENCOUNTER — Other Ambulatory Visit: Payer: Self-pay | Admitting: Internal Medicine

## 2021-11-24 ENCOUNTER — Telehealth: Payer: Self-pay | Admitting: Internal Medicine

## 2021-11-24 DIAGNOSIS — J449 Chronic obstructive pulmonary disease, unspecified: Secondary | ICD-10-CM

## 2021-11-24 MED ORDER — ALBUTEROL SULFATE (2.5 MG/3ML) 0.083% IN NEBU: 2.5000 mg | INHALATION_SOLUTION | Freq: Three times a day (TID) | RESPIRATORY_TRACT | 1 refills | Status: AC | PRN

## 2021-11-24 NOTE — Telephone Encounter (Signed)
Patient is moving and she packed her albuterol (PROVENTIL) (2.5 MG/3ML) 0.083% nebulizer solution and she can not find it. She would like refill. Please call her at [phone]. ?

## 2021-11-24 NOTE — Telephone Encounter (Signed)
Spoke with pt to let her know that medication has been refilled.  

## 2022-01-13 ENCOUNTER — Encounter: Payer: Self-pay | Admitting: Internal Medicine

## 2022-01-13 ENCOUNTER — Ambulatory Visit (INDEPENDENT_AMBULATORY_CARE_PROVIDER_SITE_OTHER): Payer: Medicare HMO | Admitting: Internal Medicine

## 2022-01-13 ENCOUNTER — Ambulatory Visit (INDEPENDENT_AMBULATORY_CARE_PROVIDER_SITE_OTHER): Payer: Medicare HMO

## 2022-01-13 VITALS — BP 210/88 | HR 93 | Temp 98.4°F | Ht 58.5 in | Wt 150.0 lb

## 2022-01-13 DIAGNOSIS — E1121 Type 2 diabetes mellitus with diabetic nephropathy: Secondary | ICD-10-CM | POA: Diagnosis not present

## 2022-01-13 DIAGNOSIS — R059 Cough, unspecified: Secondary | ICD-10-CM

## 2022-01-13 DIAGNOSIS — E1151 Type 2 diabetes mellitus with diabetic peripheral angiopathy without gangrene: Secondary | ICD-10-CM

## 2022-01-13 DIAGNOSIS — J441 Chronic obstructive pulmonary disease with (acute) exacerbation: Secondary | ICD-10-CM

## 2022-01-13 DIAGNOSIS — R062 Wheezing: Secondary | ICD-10-CM | POA: Diagnosis not present

## 2022-01-13 MED ORDER — BENZONATATE 100 MG PO CAPS: 100.0000 mg | ORAL_CAPSULE | Freq: Two times a day (BID) | ORAL | 2 refills | Status: DC | PRN

## 2022-01-13 MED ORDER — ALBUTEROL SULFATE 108 (90 BASE) MCG/ACT IN AEPB: 1.0000 | INHALATION_SPRAY | Freq: Three times a day (TID) | RESPIRATORY_TRACT | 0 refills | Status: DC | PRN

## 2022-01-13 MED ORDER — PREDNISONE 10 MG PO TABS: ORAL_TABLET | ORAL | 0 refills | Status: DC

## 2022-01-13 MED ORDER — DOXYCYCLINE HYCLATE 100 MG PO TABS: 100.0000 mg | ORAL_TABLET | Freq: Two times a day (BID) | ORAL | 0 refills | Status: DC

## 2022-01-13 NOTE — Patient Instructions (Addendum)
  You are having a COPD exacerbation   I am treating you  with the following:  Prednisone tapering dose for the next 8 days   Doxycycline  twice daily with food  For   7 days (antibiotic) Continue your Spiriva daily .  Use your albuterol inhaler as needed Take tessalon perles for cough. If your insurance won't pay for it,  get the Delsym OTC Also get generic zyrtec (cetirizine 10 mg ) and take one tablet every night   If  your chest x ray suggests pneumonia,  I will need to ADD a second antibiotic    Please take a probiotic ( Align, Floraque or Culturelle), or  the generic version of one of these  For a minimum of 3 weeks to prevent a serious antibiotic associated diarrhea  Called clostridium dificile colitis  .  The alternative is to eat a serving of yogurt daily with live cultures.

## 2022-01-13 NOTE — Progress Notes (Signed)
Subjective:  Patient ID: Patricia Fields, female    DOB: 24-Apr-1956  Age: 66 y.o. MRN: [id number]  CC: The primary encounter diagnosis was Diabetic nephropathy with proteinuria ([place]). Diagnoses of Cough, COPD with acute exacerbation ([place]), and DM (diabetes mellitus), type 2 with peripheral vascular complications [facility]) were also pertinent to this visit.   HPI Patricia Fields presents for management of bronchitis.   Patricia Fields is a 66 yr old current  smoker with one week history of bronchitis after visit a friend in [place].  Started with allergy symptoms , but those have resoled.  Denies body aches but developed right sided pain .  Still smoking 1/2 pack . Cough is wet sounding but phlegm is clear.  Had a fever 2 days ago during a period of malasie,  subjective:   woke up with night sweats  . Using only albuterol through nebulizer   DM:  has given up    soft drinks     Outpatient Medications Prior to Visit  Medication Sig Dispense Refill   albuterol (PROVENTIL) (2.5 MG/3ML) 0.083% nebulizer solution Take 3 mLs (2.5 mg total) by nebulization every 8 (eight) hours as needed for wheezing or shortness of breath. 150 mL 1   aspirin EC 81 MG tablet Take 1 tablet (81 mg total) by mouth daily. 90 tablet 2   atorvastatin (LIPITOR) 40 MG tablet Take 1 tablet (40 mg total) by mouth daily. 90 tablet 1   blood glucose meter kit and supplies Use to check blood sugars once daily.  (FOR ICD-10 E10.9, E11.9). 1 each 0   Lancets (ONETOUCH DELICA PLUS [id number]) MISC USE WITH DEVICE TO TEST  ONCE DAILY 100 each 3   metoprolol succinate (TOPROL-XL) 25 MG 24 hr tablet TAKE 1 TABLET BY MOUTH EVERY DAY WITH OR IMMEDIATELY FOLLOWING A MEAL 90 tablet 2   omega-3 acid ethyl esters (LOVAZA) 1 g capsule Take 2 capsules by mouth 2 (two) times daily.     ondansetron (ZOFRAN ODT) 4 MG disintegrating tablet Take 1 tablet (4 mg total) by mouth every 8 (eight) hours as needed for nausea or vomiting. 60 tablet 2   ONETOUCH ULTRA test  strip USE WITH METER TO CHECK  BLOOD SUGAR ONCE DAILY. 100 strip 3   PARoxetine (PAXIL) 10 MG tablet TAKE 1 TABLET BY MOUTH EVERY MORNING 90 tablet 1   telmisartan (MICARDIS) 40 MG tablet Take 1 tablet (40 mg total) by mouth daily. 90 tablet 1   Tiotropium Bromide Monohydrate (SPIRIVA RESPIMAT) 2.5 MCG/ACT AERS Inhale 2 puffs into the lungs daily. 12 g 1   triamterene-hydrochlorothiazide (DYAZIDE) 37.5-25 MG capsule Take 1 each (1 capsule total) by mouth daily. 90 capsule 1   Albuterol Sulfate (PROAIR RESPICLICK) 121 (90 Base) MCG/ACT AEPB Inhale 1 puff into the lungs every 8 (eight) hours as needed. For wheezing 3 each 0   varenicline (CHANTIX) 0.5 MG tablet Take 1 tablet by mouth daily for 3 days, then increase to 1 tablet twice daily (Patient not taking: Reported on 07/28/2021) 56 tablet 5   Facility-Administered Medications Prior to Visit  Medication Dose Route Frequency Provider Last Rate Last Admin   ipratropium-albuterol (DUONEB) 0.5-2.5 (3) MG/3ML nebulizer solution 3 mL  3 mL Nebulization Q6H Arnett, Yvetta Coder, FNP       betamethasone acetate-betamethasone sodium phosphate (CELESTONE) injection 3 mg  3 mg Intramuscular Once Edrick Kins, DPM        Review of Systems;  Patient denies headache, fevers, malaise, unintentional  weight loss, skin rash, eye pain, sinus congestion and sinus pain, sore throat, dysphagia,  hemoptysis , cough, dyspnea, wheezing, chest pain, palpitations, orthopnea, edema, abdominal pain, nausea, melena, diarrhea, constipation, flank pain, dysuria, hematuria, urinary  Frequency, nocturia, numbness, tingling, seizures,  Focal weakness, Loss of consciousness,  Tremor, insomnia, depression, anxiety, and suicidal ideation.      Objective:  BP (!) 210/88 (BP Location: Left Arm, Patient Position: Sitting, Cuff Size: Normal)   Pulse 93   Temp 98.4 F (36.9 C) (Oral)   Ht 4' 10.5" (1.486 m)   Wt 150 lb (68 kg)   SpO2 98%   BMI 30.82 kg/m   BP Readings from Last  3 Encounters:  01/13/22 (!) 210/88  07/28/21 (!) 186/90  01/13/21 140/70    Wt Readings from Last 3 Encounters:  01/13/22 150 lb (68 kg)  07/28/21 143 lb (64.9 kg)  02/17/21 149 lb (67.6 kg)    General appearance: alert, cooperative and appears stated age Ears: normal TM's and external ear canals both ears Throat: lips, mucosa, and tongue normal; teeth and gums normal Neck: no adenopathy, no carotid bruit, supple, symmetrical, trachea midline and thyroid not enlarged, symmetric, no tenderness/mass/nodules Back: symmetric, no curvature. ROM normal. No CVA tenderness. Lungs: clear to auscultation bilaterally Heart: regular rate and rhythm, S1, S2 normal, no murmur, click, rub or gallop Abdomen: soft, non-tender; bowel sounds normal; no masses,  no organomegaly Pulses: 2+ and symmetric Skin: Skin color, texture, turgor normal. No rashes or lesions Lymph nodes: Cervical, supraclavicular, and axillary nodes normal.  Lab Results  Component Value Date   HGBA1C 6.0 07/28/2021   HGBA1C 6.5 01/13/2021   HGBA1C 6.1 10/16/2020    Lab Results  Component Value Date   CREATININE 0.97 07/28/2021   CREATININE 1.08 02/09/2021   CREATININE 1.25 (H) 01/13/2021    Lab Results  Component Value Date   WBC 8.1 03/12/2018   HGB 13.5 03/12/2018   HCT 39.5 03/12/2018   PLT 240.0 03/12/2018   GLUCOSE 99 07/28/2021   CHOL 145 07/28/2021   TRIG 80.0 07/28/2021   HDL 49.80 07/28/2021   LDLDIRECT 95.0 01/17/2020   LDLCALC 79 07/28/2021   ALT 10 07/28/2021   AST 11 07/28/2021   NA 141 07/28/2021   K 3.8 07/28/2021   CL 108 07/28/2021   CREATININE 0.97 07/28/2021   BUN 17 07/28/2021   CO2 26 07/28/2021   TSH 0.88 11/29/2017   HGBA1C 6.0 07/28/2021   MICROALBUR 19.5 (H) 10/16/2020    MM CLIP PLACEMENT RIGHT  Result Date: 10/23/2019 CLINICAL DATA:  Post stereotactic guided biopsy of calcifications within the upper inner posterior right breast. EXAM: DIAGNOSTIC RIGHT MAMMOGRAM POST  STEREOTACTIC BIOPSY COMPARISON:  Previous exam(s). FINDINGS: Mammographic images were obtained following stereotactic guided biopsy of calcifications within the upper inner posterior right breast. A coil shaped biopsy marking clip is present and located approximately 0.9 cm superior to the biopsied calcifications within the upper inner posterior right breast. IMPRESSION: Coil shaped biopsy marking clip located 0.9 cm superior to the biopsied calcifications within the upper inner posterior right breast. Final Assessment: Post Procedure Mammograms for Marker Placement Electronically Signed   By: Everlean Alstrom M.D.   On: 10/23/2019 13:35   MM RT BREAST BX W LOC DEV 1ST LESION IMAGE BX SPEC STEREO GUIDE  Addendum Date: 10/29/2019   ADDENDUM REPORT: 10/28/2019 11:51 ADDENDUM: PATHOLOGY revealed: A. BREAST, RIGHT UPPER INNER QUADRANT, POSTERIOR; STEREOTACTIC CORE BIOPSY: - BENIGN MAMMARY PARENCHYMA WITH FIBROADENOMATOID CHANGES AND  ASSOCIATED COARSE CALCIFICATIONS. - NEGATIVE FOR ATYPICAL PROLIFERATIVE BREAST DISEASE. Pathology results are CONCORDANT with imaging findings, per Dr. Everlean Alstrom. Multiple attempts to contact patient were unsuccessful. Notified patient's provider office, on 10/25/2019, requesting they contact patient with biopsy results. Recommendation: Return to annual bilateral screening mammogram (February 2022). Addendum by Electa Sniff RN on 10/28/2019. Electronically Signed   By: Everlean Alstrom M.D.   On: 10/28/2019 11:51   Result Date: 10/29/2019 CLINICAL DATA:  66 year old female with indeterminate calcifications in the upper inner posterior right breast. EXAM: RIGHT BREAST STEREOTACTIC CORE NEEDLE BIOPSY COMPARISON:  Previous exams. FINDINGS: The patient and I discussed the procedure of stereotactic-guided biopsy including benefits and alternatives. We discussed the high likelihood of a successful procedure. We discussed the risks of the procedure including infection, bleeding, tissue injury,  clip migration, and inadequate sampling. Informed written consent was given. The usual time out protocol was performed immediately prior to the procedure. Using sterile technique and 1% Lidocaine as local anesthetic, under stereotactic guidance, a 9 gauge vacuum assisted device was used to perform core needle biopsy of calcifications in the upper inner posterior right breast using a superior to inferior approach. Specimen radiograph was performed showing the presence of calcifications. Specimens with calcifications are identified for pathology. Lesion quadrant: Upper inner At the conclusion of the procedure, coil shaped tissue marker clip was deployed into the biopsy cavity. Follow-up 2-view mammogram was performed and dictated separately. IMPRESSION: Stereotactic-guided biopsy of calcifications in the upper inner posterior right breast. No apparent complications. Electronically Signed: By: Everlean Alstrom M.D. On: 10/23/2019 13:25    Assessment & Plan:   Problem List Items Addressed This Visit     COPD with acute exacerbation ([place])    Still smoking,  Will start . empiric doxycycline prednisone taper and cough suppression.  Checking chest x ray,  Will add 2nd antibiotic if she has an infiltrate       Relevant Medications   Albuterol Sulfate (PROAIR RESPICLICK) 692 (90 Base) MCG/ACT AEPB   benzonatate (TESSALON) 100 MG capsule   predniSONE (DELTASONE) 10 MG tablet   Diabetic nephropathy with proteinuria (HCC) - Primary   Relevant Orders   Urine Microalbumin w/creat. ratio   DM (diabetes mellitus), type 2 with peripheral vascular complications (HCC)    Diet controlled.  She has modified diet and is taking atorvastatin and telmisartan as prescribed   Lab Results  Component Value Date   HGBA1C 6.0 07/28/2021  c Lab Results  Component Value Date   MICROALBUR 19.5 (H) 10/16/2020   MICROALBUR 65.3 (H) 01/17/2020          Other Visit Diagnoses     Cough       Relevant Medications    Albuterol Sulfate (PROAIR RESPICLICK) 493 (90 Base) MCG/ACT AEPB   Other Relevant Orders   DG Chest 2 View       Follow-up: Return in about 2 weeks (around 01/27/2022).   Crecencio Mc, MD

## 2022-01-14 NOTE — Assessment & Plan Note (Signed)
Diet controlled.  She has modified diet and is taking atorvastatin and telmisartan as prescribed   Lab Results  Component Value Date   HGBA1C 6.0 07/28/2021  c Lab Results  Component Value Date   MICROALBUR 19.5 (H) 10/16/2020   MICROALBUR 65.3 (H) 01/17/2020

## 2022-01-14 NOTE — Assessment & Plan Note (Addendum)
Still smoking,  Will start . empiric doxycycline prednisone taper and cough suppression.  Checking chest x ray,  Will add 2nd antibiotic if she has an infiltrate

## 2022-01-21 ENCOUNTER — Other Ambulatory Visit: Payer: Self-pay

## 2022-01-21 DIAGNOSIS — I10 Essential (primary) hypertension: Secondary | ICD-10-CM

## 2022-01-21 MED ORDER — METOPROLOL SUCCINATE ER 25 MG PO TB24: ORAL_TABLET | ORAL | 2 refills | Status: DC

## 2022-01-25 ENCOUNTER — Telehealth: Payer: Self-pay

## 2022-01-25 MED ORDER — ALBUTEROL SULFATE HFA 108 (90 BASE) MCG/ACT IN AERS: 2.0000 | INHALATION_SPRAY | Freq: Four times a day (QID) | RESPIRATORY_TRACT | 2 refills | Status: AC | PRN

## 2022-01-25 NOTE — Telephone Encounter (Signed)
Insurance will not cover the proair respiclick but they do cover the albuterol sulfate HFAA or the ventolin HFA. Is it okay to change to once of these that are covered?

## 2022-01-25 NOTE — Addendum Note (Signed)
Addended by: Sherlene Shams on: 01/25/2022 04:36 PM   Modules accepted: Orders

## 2022-01-27 ENCOUNTER — Encounter: Payer: Self-pay | Admitting: Internal Medicine

## 2022-01-27 ENCOUNTER — Other Ambulatory Visit: Payer: Self-pay

## 2022-01-27 DIAGNOSIS — E782 Mixed hyperlipidemia: Secondary | ICD-10-CM

## 2022-01-27 DIAGNOSIS — E1151 Type 2 diabetes mellitus with diabetic peripheral angiopathy without gangrene: Secondary | ICD-10-CM

## 2022-01-27 DIAGNOSIS — E1121 Type 2 diabetes mellitus with diabetic nephropathy: Secondary | ICD-10-CM

## 2022-01-27 NOTE — Progress Notes (Unsigned)
micro

## 2022-01-28 ENCOUNTER — Ambulatory Visit (INDEPENDENT_AMBULATORY_CARE_PROVIDER_SITE_OTHER): Payer: Medicare HMO | Admitting: Internal Medicine

## 2022-01-28 ENCOUNTER — Encounter: Payer: Self-pay | Admitting: Internal Medicine

## 2022-01-28 VITALS — BP 136/76 | HR 71 | Temp 98.2°F | Ht <= 58 in | Wt 154.2 lb

## 2022-01-28 DIAGNOSIS — Z23 Encounter for immunization: Secondary | ICD-10-CM | POA: Diagnosis not present

## 2022-01-28 DIAGNOSIS — E1151 Type 2 diabetes mellitus with diabetic peripheral angiopathy without gangrene: Secondary | ICD-10-CM | POA: Diagnosis not present

## 2022-01-28 DIAGNOSIS — Z1231 Encounter for screening mammogram for malignant neoplasm of breast: Secondary | ICD-10-CM

## 2022-01-28 DIAGNOSIS — E782 Mixed hyperlipidemia: Secondary | ICD-10-CM

## 2022-01-28 DIAGNOSIS — J441 Chronic obstructive pulmonary disease with (acute) exacerbation: Secondary | ICD-10-CM

## 2022-01-28 DIAGNOSIS — R921 Mammographic calcification found on diagnostic imaging of breast: Secondary | ICD-10-CM

## 2022-01-28 DIAGNOSIS — Z716 Tobacco abuse counseling: Secondary | ICD-10-CM

## 2022-01-28 LAB — TIQ- MISLABELED: DATE RECEIVED:: 6092023

## 2022-01-28 MED ORDER — ZOSTER VAC RECOMB ADJUVANTED 50 MCG/0.5ML IM SUSR: 0.5000 mL | Freq: Once | INTRAMUSCULAR | 1 refills | Status: AC

## 2022-01-28 MED ORDER — TETANUS-DIPHTH-ACELL PERTUSSIS 5-2.5-18.5 LF-MCG/0.5 IM SUSY: 0.5000 mL | PREFILLED_SYRINGE | Freq: Once | INTRAMUSCULAR | 0 refills | Status: AC

## 2022-01-28 NOTE — Progress Notes (Unsigned)
 Subjective:  Patient ID: Patricia Fields, female    DOB: 05/02/1956  Age: 65 y.o. MRN: [id number]  CC: The primary encounter diagnosis was Encounter for screening mammogram for malignant neoplasm of breast. Diagnoses of DM (diabetes mellitus), type 2 with peripheral vascular complications (HCC), Mixed hyperlipidemia, Need for pneumococcal vaccine, COPD with acute exacerbation (HCC), Tobacco abuse counseling, and Breast calcifications on mammogram were also pertinent to this visit.   HPI Patricia Fields presents for follow up on COPD exacerbation.  She was seen two weeks ago  and treated with doxycycline ,   cough suppressant and a steroid taper .  Chest x ray was negative for infiltrate.   She completed the course without complications and feels that her breathing has returned to  baseline   Still smoking 3/4 pack daily.  Explored her barriers to quitting.  Boredom appears to be a major factor since retirement.  Does not smoke in the house.  Planning to take up a craft.     Chief Complaint  Patient presents with   Follow-up    2 week follow up       Outpatient Medications Prior to Visit  Medication Sig Dispense Refill   albuterol (PROVENTIL) (2.5 MG/3ML) 0.083% nebulizer solution Take 3 mLs (2.5 mg total) by nebulization every 8 (eight) hours as needed for wheezing or shortness of breath. 150 mL 1   albuterol (VENTOLIN HFA) 108 (90 Base) MCG/ACT inhaler Inhale 2 puffs into the lungs every 6 (six) hours as needed for wheezing or shortness of breath. 8 g 2   aspirin EC 81 MG tablet Take 1 tablet (81 mg total) by mouth daily. 90 tablet 2   atorvastatin (LIPITOR) 40 MG tablet Take 1 tablet (40 mg total) by mouth daily. 90 tablet 1   benzonatate (TESSALON) 100 MG capsule Take 1 capsule (100 mg total) by mouth 2 (two) times daily as needed for cough. 2030 capsule 2   blood glucose meter kit and supplies Use to check blood sugars once daily.  (FOR ICD-10 E10.9, E11.9). 1 each 0   clopidogrel  (PLAVIX) 75 MG tablet Take 75 mg by mouth daily. Take 1 tablet (75mg)  by mouth once daily     doxycycline (VIBRA-TABS) 100 MG tablet Take 1 tablet (100 mg total) by mouth 2 (two) times daily. 14 tablet 0   Lancets (ONETOUCH DELICA PLUS LANCET33G) MISC USE WITH DEVICE TO TEST  ONCE DAILY 100 each 3   metoprolol succinate (TOPROL-XL) 25 MG 24 hr tablet TAKE 1 TABLET BY MOUTH EVERY DAY WITH OR IMMEDIATELY FOLLOWING A MEAL 90 tablet 2   ondansetron (ZOFRAN ODT) 4 MG disintegrating tablet Take 1 tablet (4 mg total) by mouth every 8 (eight) hours as needed for nausea or vomiting. 60 tablet 2   ONETOUCH ULTRA test strip USE WITH METER TO CHECK  BLOOD SUGAR ONCE DAILY. 100 strip 3   PARoxetine (PAXIL) 10 MG tablet TAKE 1 TABLET BY MOUTH EVERY MORNING 90 tablet 1   telmisartan (MICARDIS) 40 MG tablet Take 1 tablet (40 mg total) by mouth daily. 90 tablet 1   triamterene-hydrochlorothiazide (DYAZIDE) 37.5-25 MG capsule Take 1 each (1 capsule total) by mouth daily. 90 capsule 1   omega-3 acid ethyl esters (LOVAZA) 1 g capsule Take 2 capsules by mouth 2 (two) times daily. (Patient not taking: Reported on 01/27/2022)     predniSONE (DELTASONE) 10 MG tablet 6 tablets daily for 3 days, then reduce by 1 tablet daily until   gone (Patient not taking: Reported on 01/27/2022) 33 tablet 0   Tiotropium Bromide Monohydrate (SPIRIVA RESPIMAT) 2.5 MCG/ACT AERS Inhale 2 puffs into the lungs daily. (Patient not taking: Reported on 01/27/2022) 12 g 1   ipratropium-albuterol (DUONEB) 0.5-2.5 (3) MG/3ML nebulizer solution 3 mL      No facility-administered medications prior to visit.    Review of Systems;  Patient denies headache, fevers, malaise, unintentional weight loss, skin rash, eye pain, sinus congestion and sinus pain, sore throat, dysphagia,  hemoptysis , cough, dyspnea, wheezing, chest pain, palpitations, orthopnea, edema, abdominal pain, nausea, melena, diarrhea, constipation, flank pain, dysuria, hematuria, urinary   Frequency, nocturia, numbness, tingling, seizures,  Focal weakness, Loss of consciousness,  Tremor, insomnia, depression, anxiety, and suicidal ideation.      Objective:  BP 136/76 (BP Location: Left Arm, Patient Position: Sitting, Cuff Size: Normal)   Pulse 71   Temp 98.2 F (36.8 C) (Oral)   Ht 4' 10" (1.473 m)   Wt 154 lb 3.2 oz (69.9 kg)   SpO2 95%   BMI 32.23 kg/m   BP Readings from Last 3 Encounters:  01/28/22 136/76  01/13/22 (!) 210/88  07/28/21 (!) 186/90    Wt Readings from Last 3 Encounters:  01/28/22 154 lb 3.2 oz (69.9 kg)  01/13/22 150 lb (68 kg)  07/28/21 143 lb (64.9 kg)    General appearance: alert, cooperative and appears stated age Ears: normal TM's and external ear canals both ears Throat: lips, mucosa, and tongue normal; teeth and gums normal Neck: no adenopathy, no carotid bruit, supple, symmetrical, trachea midline and thyroid not enlarged, symmetric, no tenderness/mass/nodules Back: symmetric, no curvature. ROM normal. No CVA tenderness. Lungs: clear to auscultation bilaterally Heart: regular rate and rhythm, S1, S2 normal, no murmur, click, rub or gallop Abdomen: soft, non-tender; bowel sounds normal; no masses,  no organomegaly Pulses: 2+ and symmetric Skin: Skin color, texture, turgor normal. No rashes or lesions Lymph nodes: Cervical, supraclavicular, and axillary nodes normal.  Lab Results  Component Value Date   HGBA1C 5.8 (H) 01/28/2022   HGBA1C 6.0 07/28/2021   HGBA1C 6.5 01/13/2021    Lab Results  Component Value Date   CREATININE 0.97 07/28/2021   CREATININE 1.08 02/09/2021   CREATININE 1.25 (H) 01/13/2021    Lab Results  Component Value Date   WBC 8.1 03/12/2018   HGB 13.5 03/12/2018   HCT 39.5 03/12/2018   PLT 240.0 03/12/2018   GLUCOSE 99 07/28/2021   CHOL 145 07/28/2021   TRIG 80.0 07/28/2021   HDL 49.80 07/28/2021   LDLDIRECT 95.0 01/17/2020   LDLCALC 79 07/28/2021   ALT 10 07/28/2021   AST 11 07/28/2021   NA  141 07/28/2021   K 3.8 07/28/2021   CL 108 07/28/2021   CREATININE 0.97 07/28/2021   BUN 17 07/28/2021   CO2 26 07/28/2021   TSH 0.88 11/29/2017   HGBA1C 5.8 (H) 01/28/2022   MICROALBUR 2.2 01/28/2022    Assessment & Plan:   Problem List Items Addressed This Visit     Tobacco abuse counseling    She has increased daily consumption to  3/4 pack  .  Encouraged to continue gradual reduction  Given her PVD/CAD and greatly increased risk of CVA /AMI      Hyperlipidemia   Relevant Orders   Lipid Profile   Direct LDL   DM (diabetes mellitus), type 2 with peripheral vascular complications (HCC)    Diet controlled.  She has modified diet and is taking atorvastatin   and telmisartan as prescribed   Lab Results  Component Value Date   HGBA1C 5.8 (H) 01/28/2022  c Lab Results  Component Value Date   MICROALBUR 2.2 01/28/2022   MICROALBUR 19.5 (H) 10/16/2020          Relevant Orders   Urine Microalbumin w/creat. ratio (Completed)   HgB A1c (Completed)   COMPLETE METABOLIC PANEL WITH GFR   COPD with acute exacerbation (HCC)    Resolved at two week follow up.  Chest x ray was negative for infiltrate.  encouraged to quit smoking .  Continue Spiriva and prn albuterol.  She declines ICS/LABA      Relevant Medications   Tiotropium Bromide Monohydrate (SPIRIVA RESPIMAT) 2.5 MCG/ACT AERS   Breast cancer screening - Primary   Relevant Orders   MM 3D SCREEN BREAST BILATERAL   Breast calcifications on mammogram    Continue annual screenings.  History of benign breast calcifications March 2021 biopsy reviewed       Other Visit Diagnoses     Need for pneumococcal vaccine       Relevant Orders   Pneumococcal conjugate vaccine 20-valent (Prevnar 20) (Completed)       Follow-up: Return in about 3 months (around 04/30/2022) for follow up diabetes.    L , MD  

## 2022-01-28 NOTE — Patient Instructions (Addendum)
Glad you are feeling better !  Now work on the smoking!   You received the last pneumonia vaccine you'll ever need today   The Tdap (tetanus-diphtheria-whooping cough vaccine ) and Shingrix vaccines are now  COVERED BY MEDICARE if you get them at your pharmacy  You can expect 24 hours of flu like symptoms after receiving the shingles vaccine, so plan accordingly.

## 2022-01-29 LAB — MICROALBUMIN / CREATININE URINE RATIO
Creatinine, Urine: 41 mg/dL (ref 20–275)
Microalb Creat Ratio: 54 mcg/mg creat — ABNORMAL HIGH (ref <30)
Microalb, Ur: 2.2 mg/dL

## 2022-01-29 LAB — HEMOGLOBIN A1C
Hgb A1c MFr Bld: 5.8 % of total Hgb — ABNORMAL HIGH (ref <5.7)
Mean Plasma Glucose: 120 mg/dL
eAG (mmol/L): 6.6 mmol/L

## 2022-01-29 LAB — TIQ- MISLABELED: Test Ordered On Req: 7600829310231

## 2022-01-30 ENCOUNTER — Encounter: Payer: Self-pay | Admitting: Internal Medicine

## 2022-01-30 MED ORDER — SPIRIVA RESPIMAT 2.5 MCG/ACT IN AERS: 2.0000 | INHALATION_SPRAY | Freq: Every day | RESPIRATORY_TRACT | 1 refills | Status: DC

## 2022-01-30 NOTE — Assessment & Plan Note (Signed)
She has increased daily consumption to  3/4 pack  .  Encouraged to continue gradual reduction  Given her PVD/CAD and greatly increased risk of CVA /AMI 

## 2022-01-30 NOTE — Assessment & Plan Note (Addendum)
Resolved at two week follow up.  Chest x ray was negative for infiltrate.  encouraged to quit smoking .  Continue Spiriva and prn albuterol.  She declines ICS/LABA

## 2022-01-30 NOTE — Assessment & Plan Note (Signed)
Continue annual screenings.  History of benign breast calcifications March 2021 biopsy reviewed  

## 2022-01-30 NOTE — Assessment & Plan Note (Signed)
Diet controlled.  She has modified diet and is taking atorvastatin and telmisartan as prescribed   Lab Results  Component Value Date   HGBA1C 5.8 (H) 01/28/2022  c Lab Results  Component Value Date   MICROALBUR 2.2 01/28/2022   MICROALBUR 19.5 (H) 10/16/2020

## 2022-01-31 ENCOUNTER — Encounter: Payer: Self-pay | Admitting: *Deleted

## 2022-01-31 NOTE — Addendum Note (Signed)
Addended by: Warden Fillers on: 01/31/2022 12:01 PM   Modules accepted: Orders

## 2022-02-02 ENCOUNTER — Other Ambulatory Visit (INDEPENDENT_AMBULATORY_CARE_PROVIDER_SITE_OTHER): Payer: Medicare HMO

## 2022-02-02 DIAGNOSIS — E1151 Type 2 diabetes mellitus with diabetic peripheral angiopathy without gangrene: Secondary | ICD-10-CM | POA: Diagnosis not present

## 2022-02-02 DIAGNOSIS — E782 Mixed hyperlipidemia: Secondary | ICD-10-CM | POA: Diagnosis not present

## 2022-02-02 LAB — COMPREHENSIVE METABOLIC PANEL
ALT: 14 U/L (ref 0–35)
AST: 11 U/L (ref 0–37)
Albumin: 3.7 g/dL (ref 3.5–5.2)
Alkaline Phosphatase: 83 U/L (ref 39–117)
BUN: 12 mg/dL (ref 6–23)
CO2: 27 mEq/L (ref 19–32)
Calcium: 8.9 mg/dL (ref 8.4–10.5)
Chloride: 108 mEq/L (ref 96–112)
Creatinine, Ser: 0.98 mg/dL (ref 0.40–1.20)
GFR: 60.01 mL/min (ref >60.00)
Glucose, Bld: 117 mg/dL — ABNORMAL HIGH (ref 70–99)
Potassium: 3.6 mEq/L (ref 3.5–5.1)
Sodium: 143 mEq/L (ref 135–145)
Total Bilirubin: 0.5 mg/dL (ref 0.2–1.2)
Total Protein: 6.1 g/dL (ref 6.0–8.3)

## 2022-02-02 LAB — LIPID PANEL
Cholesterol: 177 mg/dL (ref 0–200)
HDL: 58.3 mg/dL (ref >39.00)
LDL Cholesterol: 87 mg/dL (ref 0–99)
NonHDL: 119.13
Total CHOL/HDL Ratio: 3
Triglycerides: 161 mg/dL — ABNORMAL HIGH (ref 0.0–149.0)
VLDL: 32.2 mg/dL (ref 0.0–40.0)

## 2022-02-02 LAB — LDL CHOLESTEROL, DIRECT: Direct LDL: 88 mg/dL

## 2022-02-17 ENCOUNTER — Observation Stay
Admission: EM | Admit: 2022-02-17 | Discharge: 2022-02-18 | Disposition: A | Discharge status: Home / Self Care | Payer: Medicare HMO | Attending: Internal Medicine | Admitting: Internal Medicine

## 2022-02-17 ENCOUNTER — Encounter: Payer: Self-pay | Admitting: Emergency Medicine

## 2022-02-17 DIAGNOSIS — R040 Epistaxis: Secondary | ICD-10-CM | POA: Diagnosis not present

## 2022-02-17 DIAGNOSIS — F32A Depression, unspecified: Secondary | ICD-10-CM

## 2022-02-17 DIAGNOSIS — E1151 Type 2 diabetes mellitus with diabetic peripheral angiopathy without gangrene: Secondary | ICD-10-CM | POA: Diagnosis not present

## 2022-02-17 DIAGNOSIS — Z7982 Long term (current) use of aspirin: Secondary | ICD-10-CM | POA: Diagnosis not present

## 2022-02-17 DIAGNOSIS — I160 Hypertensive urgency: Secondary | ICD-10-CM | POA: Diagnosis not present

## 2022-02-17 DIAGNOSIS — F17210 Nicotine dependence, cigarettes, uncomplicated: Secondary | ICD-10-CM | POA: Diagnosis not present

## 2022-02-17 DIAGNOSIS — R58 Hemorrhage, not elsewhere classified: Secondary | ICD-10-CM | POA: Diagnosis not present

## 2022-02-17 DIAGNOSIS — J449 Chronic obstructive pulmonary disease, unspecified: Secondary | ICD-10-CM | POA: Diagnosis not present

## 2022-02-17 DIAGNOSIS — I10 Essential (primary) hypertension: Secondary | ICD-10-CM | POA: Diagnosis not present

## 2022-02-17 DIAGNOSIS — K920 Hematemesis: Secondary | ICD-10-CM | POA: Diagnosis not present

## 2022-02-17 DIAGNOSIS — Z79899 Other long term (current) drug therapy: Secondary | ICD-10-CM | POA: Diagnosis not present

## 2022-02-17 DIAGNOSIS — E785 Hyperlipidemia, unspecified: Secondary | ICD-10-CM

## 2022-02-17 DIAGNOSIS — E1169 Type 2 diabetes mellitus with other specified complication: Secondary | ICD-10-CM

## 2022-02-17 DIAGNOSIS — Z7902 Long term (current) use of antithrombotics/antiplatelets: Secondary | ICD-10-CM | POA: Diagnosis not present

## 2022-02-17 LAB — CBC WITH DIFFERENTIAL/PLATELET
Abs Immature Granulocytes: 0.04 10*3/uL (ref 0.00–0.07)
Basophils Absolute: 0.2 10*3/uL — ABNORMAL HIGH (ref 0.0–0.1)
Basophils Relative: 2 %
Eosinophils Absolute: 0.5 10*3/uL (ref 0.0–0.5)
Eosinophils Relative: 5 %
HCT: 43.4 % (ref 36.0–46.0)
Hemoglobin: 14.3 g/dL (ref 12.0–15.0)
Immature Granulocytes: 0 %
Lymphocytes Relative: 39 %
Lymphs Abs: 4 10*3/uL (ref 0.7–4.0)
MCH: 31 pg (ref 26.0–34.0)
MCHC: 32.9 g/dL (ref 30.0–36.0)
MCV: 93.9 fL (ref 80.0–100.0)
Monocytes Absolute: 0.7 10*3/uL (ref 0.1–1.0)
Monocytes Relative: 7 %
Neutro Abs: 4.9 10*3/uL (ref 1.7–7.7)
Neutrophils Relative %: 47 %
Platelets: 322 10*3/uL (ref 150–400)
RBC: 4.62 MIL/uL (ref 3.87–5.11)
RDW: 13.7 % (ref 11.5–15.5)
WBC: 10.3 10*3/uL (ref 4.0–10.5)
nRBC: 0 % (ref 0.0–0.2)

## 2022-02-17 LAB — COMPREHENSIVE METABOLIC PANEL
ALT: 18 U/L (ref 0–44)
AST: 19 U/L (ref 15–41)
Albumin: 4.3 g/dL (ref 3.5–5.0)
Alkaline Phosphatase: 89 U/L (ref 38–126)
Anion gap: 10 (ref 5–15)
BUN: 23 mg/dL (ref 8–23)
CO2: 26 mmol/L (ref 22–32)
Calcium: 10 mg/dL (ref 8.9–10.3)
Chloride: 104 mmol/L (ref 98–111)
Creatinine, Ser: 1.17 mg/dL — ABNORMAL HIGH (ref 0.44–1.00)
GFR, Estimated: 51 mL/min — ABNORMAL LOW (ref >60)
Glucose, Bld: 146 mg/dL — ABNORMAL HIGH (ref 70–99)
Potassium: 3.6 mmol/L (ref 3.5–5.1)
Sodium: 140 mmol/L (ref 135–145)
Total Bilirubin: 0.6 mg/dL (ref 0.3–1.2)
Total Protein: 7.2 g/dL (ref 6.5–8.1)

## 2022-02-17 LAB — TYPE AND SCREEN
ABO/RH(D): O POS
Antibody Screen: NEGATIVE

## 2022-02-17 MED ORDER — OXYMETAZOLINE HCL 0.05 % NA SOLN
2.0000 | Freq: Once | NASAL | Status: AC
  Administered 2022-02-17: 2 via NASAL
  Filled 2022-02-17: qty 30

## 2022-02-17 MED ORDER — LIDOCAINE VISCOUS HCL 2 % MT SOLN
15.0000 mL | Freq: Once | OROMUCOSAL | Status: AC
  Administered 2022-02-18: 15 mL via OROMUCOSAL
  Filled 2022-02-17: qty 15

## 2022-02-17 MED ORDER — SILVER NITRATE-POT NITRATE 75-25 % EX MISC
1.0000 | Freq: Once | CUTANEOUS | Status: AC
  Administered 2022-02-18: 1 via TOPICAL
  Filled 2022-02-17: qty 10

## 2022-02-17 NOTE — ED Provider Notes (Signed)
[facility] Provider Note    Event Date/Time   First MD Initiated Contact with Patient 02/17/22 2257     (approximate)   History   Epistaxis   HPI  Patricia Fields is a 66 y.o. female with a history of COPD, diabetes, hypertension, and peripheral artery disease on Plavix who presents with a nosebleed, cute onset within the last hour, mainly coming out of the right nare, persistent course, and with a large amount of bleeding at home.  The patient denies weakness or lightheadedness.  She denies any significant prior history of nosebleeds.  She denies any other abnormal bleeding or bruising.      Physical Exam   Triage Vital Signs: ED Triage Vitals  Enc Vitals Group     BP 02/17/22 2235 (!) 176/91     Pulse Rate 02/17/22 2235 85     Resp 02/17/22 2235 18     Temp 02/17/22 2235 97.7 F (36.5 C)     Temp Source 02/17/22 2235 Oral     SpO2 02/17/22 2235 98 %     Weight 02/17/22 2232 150 lb (68 kg)     Height 02/17/22 2232 4\' 10"  (1.473 m)     Head Circumference --      Peak Flow --      Pain Score --      Pain Loc --      Pain Edu? --      Excl. in GC? --     Most recent vital signs: Vitals:   02/18/22 0045 02/18/22 0100  BP: (!) 169/76 (!) 144/72  Pulse: (!) 58 62  Resp:  17  Temp:    SpO2: 97% 92%     General: Alert and oriented, relatively well-appearing. CV:  Good peripheral perfusion.  Resp:  Normal effort.  Abd:  No distention.  Other:  Anterior epistaxis from right nare with area of raw mucosa/bleeding to anterior right septum.  Oropharynx clear.   ED Results / Procedures / Treatments   Labs (all labs ordered are listed, but only abnormal results are displayed) Labs Reviewed  CBC WITH DIFFERENTIAL/PLATELET - Abnormal; Notable for the following components:      Result Value   Basophils Absolute 0.2 (*)    All other components within normal limits  COMPREHENSIVE METABOLIC PANEL - Abnormal; Notable for the following  components:   Glucose, Bld 146 (*)    Creatinine, Ser 1.17 (*)    GFR, Estimated 51 (*)    All other components within normal limits  TYPE AND SCREEN     EKG     RADIOLOGY      PROCEDURES:  Critical Care performed: No  .Epistaxis Management  Date/Time: 02/18/2022 12:28 AM  Performed by: 02/20/2022, MD Authorized by: Dionne Bucy, MD   Consent:    Consent obtained:  Verbal   Consent given by:  Patient Universal protocol:    Patient identity confirmed:  Verbally with patient Anesthesia:    Anesthesia method:  Topical application   Topical anesthetic:  Lidocaine gel Procedure details:    Treatment site:  R anterior   Treatment method:  Silver nitrate   Treatment complexity:  Limited   Treatment episode: initial   Post-procedure details:    Assessment:  Bleeding decreased   Procedure completion:  Tolerated well, no immediate complications .Epistaxis Management  Date/Time: 02/18/2022 12:29 AM  Performed by: 02/20/2022, MD Authorized by: Dionne Bucy, MD   Consent:    Consent  obtained:  Verbal   Consent given by:  Patient Universal protocol:    Patient identity confirmed:  Verbally with patient Anesthesia:    Anesthesia method:  Topical application   Topical anesthetic:  Lidocaine gel Procedure details:    Treatment site:  R anterior   Treatment method:  Merocel sponge   Treatment complexity:  Limited   Treatment episode: recurring   Post-procedure details:    Assessment:  Bleeding stopped   Procedure completion:  Tolerated well, no immediate complications    MEDICATIONS ORDERED IN ED: Medications  oxymetazoline (AFRIN) 0.05 % nasal spray 2 spray (2 sprays Each Nare Given 02/17/22 2244)  silver nitrate applicators applicator 1 Stick (1 Stick Topical Given by Other 02/18/22 0057)  lidocaine (XYLOCAINE) 2 % viscous mouth solution 15 mL (15 mLs Mouth/Throat Given by Other 02/18/22 0056)  ondansetron (ZOFRAN) injection 4 mg (4  mg Intravenous Given 02/18/22 0050)  sodium chloride 0.9 % bolus 500 mL (500 mLs Intravenous New Bag/Given 02/18/22 0056)     IMPRESSION / MDM / [place] / ED COURSE  I reviewed the triage vital signs and the nursing notes.  66 year old female with PMH as noted above on Plavix presents with acute onset of epistaxis from the right nare.  She denies any other acute complaints.  On exam the patient is hypertensive with otherwise normal vital signs.  She has a visible anterior epistaxis from the right nare, likely coming from the anterior right septum the area.  Afrin was given by EMS and a nose clip was placed with no resolution of the bleeding.  Differential diagnosis includes, but is not limited to, anterior epistaxis, posterior epistaxis, bleeding related to her antiplatelet agent.  Patient's presentation is most consistent with acute presentation with potential threat to life or bodily function.  We will obtain labs given the blood loss at home, attempt silver nitrate cautery, and plan for nasal packing if this is not successful.  The patient is on the cardiac monitor to evaluate for evidence of arrhythmia and/or significant heart rate changes.  ----------------------------------------- 12:30 AM on 02/18/2022 -----------------------------------------  I performed cautery with silver nitrate which was moderately successful.  The bleeding initially stopped completely.  It then started again but at a significantly lower rate than before.  It was coming from the same area.  I then placed a Merocel sponge.  At this time, the Merocel has been in for approximately 30 minutes.  The bleeding has almost completely stopped.  The patient has to occasionally dab with a gauze although there is no blood actively coming out of the nare..  She states that the blood going down the back of her throat has stopped.  I consulted Dr. Pryor Ochoa from ENT and discussed the case with him.  He advises that if  the bleeding is controlled as it appears to be, the patient may be discharged and follow-up with him tomorrow morning to have the packing removed and possible repeat attempt at cautery.  The patient feels comfortable and wants to go home.  I counseled the patient and her family members on the interventions performed, the plan of care, and on return precautions; they expressed understanding.  ----------------------------------------- 12:37 AM on 02/18/2022 -----------------------------------------  As the patient got up to use the bathroom she became nauseous and vomited a couple of times.  The vomitus appears bloody although this is consistent with the blood that went down the back of her throat that she swallowed.  She has no history  of GI bleeding.  We will give some fluids, Zofran, and observe the patient.  ----------------------------------------- 1:27 AM on 02/18/2022 -----------------------------------------  The patient has not had any further vomiting and is feeling a bit better.  Given the bloody vomitus, although clinically I suspect that this was just the swallowed blood rather than any actual GI bleed, I feel be prudent to observe the patient in the hospital overnight and obtain repeat hemoglobin levels.  She can then either be reevaluated by ENT at the hospital, or still go to her outpatient appointment tomorrow.  The patient is in agreement with this plan.  I consulted Dr. Arville Care from the hospitalist service; based on our discussion he agrees to admit the patient    FINAL CLINICAL IMPRESSION(S) / ED DIAGNOSES   Final diagnoses:  Anterior epistaxis     Rx / DC Orders   ED Discharge Orders     None        Note:  This document was prepared using Dragon voice recognition software and may include unintentional dictation errors.    Dionne Bucy, MD 02/18/22 [phone]

## 2022-02-17 NOTE — ED Triage Notes (Addendum)
Pt presents via EMS with complaints of a nose bleed - pt takes Plavix daily. Nose clip applied to the patient by EMS PTA. Per family, there was a large amount of blood at home when the bleeding started.   Pt received 2 sprays of Afrin with EMS without improvement.

## 2022-02-17 NOTE — ED Triage Notes (Signed)
EMS brings pt from home to lobby via w/c with no distress noted; reports nosebleed to rt nare x ; afrin x 2 admin enroute and nose clamp applied upon arrival

## 2022-02-18 ENCOUNTER — Ambulatory Visit: Payer: Medicare HMO

## 2022-02-18 DIAGNOSIS — F32A Depression, unspecified: Secondary | ICD-10-CM

## 2022-02-18 DIAGNOSIS — I160 Hypertensive urgency: Secondary | ICD-10-CM

## 2022-02-18 DIAGNOSIS — E1169 Type 2 diabetes mellitus with other specified complication: Secondary | ICD-10-CM

## 2022-02-18 DIAGNOSIS — K920 Hematemesis: Secondary | ICD-10-CM

## 2022-02-18 DIAGNOSIS — E785 Hyperlipidemia, unspecified: Secondary | ICD-10-CM

## 2022-02-18 DIAGNOSIS — E1151 Type 2 diabetes mellitus with diabetic peripheral angiopathy without gangrene: Secondary | ICD-10-CM

## 2022-02-18 DIAGNOSIS — R040 Epistaxis: Principal | ICD-10-CM

## 2022-02-18 HISTORY — DX: Hematemesis: K92.0

## 2022-02-18 LAB — CBC
HCT: 42.1 % (ref 36.0–46.0)
Hemoglobin: 13.7 g/dL (ref 12.0–15.0)
MCH: 31.6 pg (ref 26.0–34.0)
MCHC: 32.5 g/dL (ref 30.0–36.0)
MCV: 97 fL (ref 80.0–100.0)
Platelets: 313 10*3/uL (ref 150–400)
RBC: 4.34 MIL/uL (ref 3.87–5.11)
RDW: 13.7 % (ref 11.5–15.5)
WBC: 17.1 10*3/uL — ABNORMAL HIGH (ref 4.0–10.5)
nRBC: 0 % (ref 0.0–0.2)

## 2022-02-18 LAB — BASIC METABOLIC PANEL
Anion gap: 9 (ref 5–15)
BUN: 30 mg/dL — ABNORMAL HIGH (ref 8–23)
CO2: 27 mmol/L (ref 22–32)
Calcium: 10 mg/dL (ref 8.9–10.3)
Chloride: 106 mmol/L (ref 98–111)
Creatinine, Ser: 1.18 mg/dL — ABNORMAL HIGH (ref 0.44–1.00)
GFR, Estimated: 51 mL/min — ABNORMAL LOW (ref >60)
Glucose, Bld: 152 mg/dL — ABNORMAL HIGH (ref 70–99)
Potassium: 3.7 mmol/L (ref 3.5–5.1)
Sodium: 142 mmol/L (ref 135–145)

## 2022-02-18 LAB — HEMOGLOBIN AND HEMATOCRIT, BLOOD
HCT: 36.7 % (ref 36.0–46.0)
Hemoglobin: 12.1 g/dL (ref 12.0–15.0)

## 2022-02-18 LAB — HIV ANTIBODY (ROUTINE TESTING W REFLEX): HIV Screen 4th Generation wRfx: NONREACTIVE

## 2022-02-18 MED ORDER — ACETAMINOPHEN 325 MG PO TABS: 650.0000 mg | ORAL_TABLET | Freq: Four times a day (QID) | ORAL | Status: DC | PRN

## 2022-02-18 MED ORDER — ALBUTEROL SULFATE (2.5 MG/3ML) 0.083% IN NEBU: 2.5000 mg | INHALATION_SOLUTION | Freq: Four times a day (QID) | RESPIRATORY_TRACT | Status: DC | PRN

## 2022-02-18 MED ORDER — ONDANSETRON HCL 4 MG/2ML IJ SOLN
INTRAMUSCULAR | Status: AC
  Administered 2022-02-18: 4 mg via INTRAVENOUS
  Filled 2022-02-18: qty 2

## 2022-02-18 MED ORDER — ALBUTEROL SULFATE HFA 108 (90 BASE) MCG/ACT IN AERS: 2.0000 | INHALATION_SPRAY | Freq: Four times a day (QID) | RESPIRATORY_TRACT | Status: DC | PRN

## 2022-02-18 MED ORDER — IRBESARTAN 75 MG PO TABS
75.0000 mg | ORAL_TABLET | Freq: Every day | ORAL | Status: DC
  Administered 2022-02-18: 75 mg via ORAL
  Filled 2022-02-18: qty 1

## 2022-02-18 MED ORDER — ONDANSETRON HCL 4 MG/2ML IJ SOLN: 4.0000 mg | Freq: Four times a day (QID) | INTRAMUSCULAR | Status: DC | PRN

## 2022-02-18 MED ORDER — ACETAMINOPHEN 325 MG RE SUPP: 650.0000 mg | Freq: Four times a day (QID) | RECTAL | Status: DC | PRN

## 2022-02-18 MED ORDER — SODIUM CHLORIDE 0.9 % IV SOLN: INTRAVENOUS | Status: DC

## 2022-02-18 MED ORDER — METOPROLOL SUCCINATE ER 50 MG PO TB24
25.0000 mg | ORAL_TABLET | Freq: Every day | ORAL | Status: DC
  Administered 2022-02-18: 25 mg via ORAL
  Filled 2022-02-18: qty 1

## 2022-02-18 MED ORDER — CEPHALEXIN 500 MG PO CAPS
500.0000 mg | ORAL_CAPSULE | Freq: Two times a day (BID) | ORAL | Status: DC
  Administered 2022-02-18: 500 mg via ORAL
  Filled 2022-02-18: qty 1

## 2022-02-18 MED ORDER — BENZONATATE 100 MG PO CAPS
100.0000 mg | ORAL_CAPSULE | Freq: Two times a day (BID) | ORAL | Status: DC | PRN
Start: 2022-02-18 — End: 2022-02-18

## 2022-02-18 MED ORDER — SODIUM CHLORIDE 0.9 % IV BOLUS
500.0000 mL | Freq: Once | INTRAVENOUS | Status: AC
  Administered 2022-02-18: 500 mL via INTRAVENOUS

## 2022-02-18 MED ORDER — HYDRALAZINE HCL 20 MG/ML IJ SOLN
10.0000 mg | Freq: Four times a day (QID) | INTRAMUSCULAR | Status: DC | PRN
  Administered 2022-02-18: 10 mg via INTRAVENOUS
  Filled 2022-02-18: qty 1

## 2022-02-18 MED ORDER — MAGNESIUM HYDROXIDE 400 MG/5ML PO SUSP: 30.0000 mL | Freq: Every day | ORAL | Status: DC | PRN

## 2022-02-18 MED ORDER — TRAZODONE HCL 50 MG PO TABS: 25.0000 mg | ORAL_TABLET | Freq: Every evening | ORAL | Status: DC | PRN

## 2022-02-18 MED ORDER — ONDANSETRON HCL 4 MG PO TABS: 4.0000 mg | ORAL_TABLET | Freq: Four times a day (QID) | ORAL | Status: DC | PRN

## 2022-02-18 MED ORDER — PAROXETINE HCL 10 MG PO TABS
10.0000 mg | ORAL_TABLET | Freq: Every morning | ORAL | Status: DC
  Administered 2022-02-18: 10 mg via ORAL
  Filled 2022-02-18: qty 1

## 2022-02-18 MED ORDER — ATORVASTATIN CALCIUM 20 MG PO TABS
40.0000 mg | ORAL_TABLET | Freq: Every day | ORAL | Status: DC
  Administered 2022-02-18: 40 mg via ORAL
  Filled 2022-02-18: qty 2

## 2022-02-18 MED ORDER — TRIAMTERENE-HCTZ 37.5-25 MG PO TABS
1.0000 | ORAL_TABLET | Freq: Every day | ORAL | Status: DC
  Administered 2022-02-18: 1 via ORAL
  Filled 2022-02-18: qty 1

## 2022-02-18 MED ORDER — LABETALOL HCL 5 MG/ML IV SOLN: 20.0000 mg | INTRAVENOUS | Status: DC | PRN

## 2022-02-18 MED ORDER — TIOTROPIUM BROMIDE MONOHYDRATE 2.5 MCG/ACT IN AERS
2.0000 | INHALATION_SPRAY | Freq: Every day | RESPIRATORY_TRACT | Status: DC
Start: 2022-02-18 — End: 2022-02-18

## 2022-02-18 MED ORDER — CEPHALEXIN 500 MG PO CAPS: 500.0000 mg | ORAL_CAPSULE | Freq: Two times a day (BID) | ORAL | 0 refills | Status: AC

## 2022-02-18 MED ORDER — UMECLIDINIUM BROMIDE 62.5 MCG/ACT IN AEPB
1.0000 | INHALATION_SPRAY | Freq: Every day | RESPIRATORY_TRACT | Status: DC
Start: 2022-02-18 — End: 2022-02-18
  Filled 2022-02-18: qty 7

## 2022-02-18 MED ORDER — ONDANSETRON HCL 4 MG/2ML IJ SOLN: 4.0000 mg | Freq: Once | INTRAMUSCULAR | Status: AC

## 2022-02-18 NOTE — Assessment & Plan Note (Addendum)
-   This like secondary to hypertensive urgency and antiplatelet therapy with Plavix and aspirin. - Aggressive BP control. - We will hold off her Plavix and aspirin. - She had a nasal packing with adequate hemostasis at this time. - We will monitor H&H.

## 2022-02-18 NOTE — Assessment & Plan Note (Signed)
-   We will continue her antihypertensives and place her on as needed IV labetalol and hydralazine.

## 2022-02-18 NOTE — Assessment & Plan Note (Signed)
-  This is likely pseudo met emesis of dried blood that dropped her oropharynx from her nasopharynx and she had subsequent bloody vomitus. - We will monitor serial H&H. - As needed antiemetics will be provided.

## 2022-02-18 NOTE — Assessment & Plan Note (Deleted)
-   The patient will be placed on supplement coverage with NovoLog. - We will hold off Plavix and aspirin as mentioned above. 

## 2022-02-18 NOTE — Discharge Instructions (Addendum)
Keep the packing in place.  You should sleep with your head and upper body elevated.  Follow-up with Dr. Andee Poles tomorrow morning at 9:30 AM.  Return to the ER immediately for new, worsening, or persistent bleeding, if you feel worsening bleeding going down the back of your throat, or coughing up any clots, if you have increased bleeding coming out the front, any weakness or lightheadedness, increased pain, difficulty breathing, or any other new or worsening symptoms that concern you.

## 2022-02-18 NOTE — ED Notes (Signed)
Patient ambulated to the in room bathroom. Patient BP is increasingly elevated. Patient states her BP is always high. Messaged MD about patient elevated BP and nose bleed.

## 2022-02-18 NOTE — Assessment & Plan Note (Signed)
-   Statin therapy will be continued. 

## 2022-02-18 NOTE — Assessment & Plan Note (Signed)
-   We will continue Paxil. 

## 2022-02-18 NOTE — Assessment & Plan Note (Signed)
-   The patient will be placed on supplement coverage with NovoLog. - We will hold off Plavix and aspirin as mentioned above.

## 2022-02-18 NOTE — ED Notes (Signed)
Walked into room. Patient was standing on the side of the bed vomiting brown red emesis. Patient was incontinent of urine. Patient was assisted to the in room toilet and cleaned. Bed linens were changed. Patient is now resting in bed, asleep. Family is at bedside.

## 2022-02-18 NOTE — ED Notes (Signed)
Patient is asleep with family at bedside

## 2022-02-18 NOTE — ED Notes (Signed)
Dc ppw provided. Pt declines vs at time of dc. Pt assited with movement to lobby alert and oriented at this time

## 2022-02-18 NOTE — Discharge Summary (Signed)
Physician Discharge Summary   Patient: Patricia Fields MRN: [id number] DOB: [birth year]  Admit date:     02/17/2022  Discharge date: 02/18/22  Discharge Physician: Ezekiel Slocumb   PCP: Crecencio Mc, MD   Recommendations at discharge:    Follow up with ENT on Wednesday next week Follow up with Primary Care in 1-2 weeks Repeat CBC, BMP in 1-2 weeks Please follow up on BP control  Discharge Diagnoses: Principal Problem:   Epistaxis Active Problems:   Hypertensive urgency   Hematemesis   Type 2 diabetes mellitus with peripheral vascular disease ([place])   Dyslipidemia   Depression  Resolved Problems:   * No resolved hospital problems. *  Hospital Course: HPI on admission: " 66 y.o.  female with medical history significant for COPD, hypertension, dyslipidemia and peripheral vascular disease with history of renal artery infarct, on Plavix, who presented to the ER with acute onset of right-sided epistaxis that started at 10 PM.  She denied any vomiting prior to arrival.  No fever or chills.  No headache or dizziness or blurred vision.  No other bleeding diathesis.   ED Course: When she came to the ER, vital signs revealed elevated blood pressure of 176/91 and later 186/98.  Labs revealed unremarkable CMP.  The patient was typed and crossmatched.  She was O+ with negative antibody screen.    She was given Afrin nasal spray, cauterized with silver nitrate and was ordered 500 mill IV normal saline as well as 4 mg of IV Zofran after having vomiting with blood clots shortly after cauterization and packing.  She will be placed in observation in medical telemetry bed for further monitoring overnight."   Patient doing well this AM, without any significant bleeding since packing placed.  She has occasional droplets of blood.  No further vomiting.  She requests discharge home this AM.  Case discussed with ENT, Dr. Pryor Ochoa who will see patient in clinic on Wednesday to remove packing.   Recommended gram positive coverage, Keflex started.   Patient is stable for discharge.   Assessment and Plan: * Epistaxis - This like secondary to hypertensive urgency and antiplatelet therapy with Plavix and aspirin. - Aggressive BP control. - We will hold off her Plavix and aspirin. - She had a nasal packing with adequate hemostasis at this time. - We will monitor H&H.  Hypertensive urgency - We will continue her antihypertensives and place her on as needed IV labetalol and hydralazine.  Hematemesis - This is likely pseudo met emesis of dried blood that dropped her oropharynx from her nasopharynx and she had subsequent bloody vomitus. - We will monitor serial H&H. - As needed antiemetics will be provided.  Type 2 diabetes mellitus with peripheral vascular disease ([place]) - The patient will be placed on supplement coverage with NovoLog. - We will hold off Plavix and aspirin as mentioned above.  Depression - We will continue Paxil  Dyslipidemia - Statin therapy will be continued.         Consultants: ENT (curbsided) Procedures performed: Silver nitrate cautery by EDP, nasal packing by EDP  Disposition: Home Diet recommendation:  Discharge Diet Orders (From admission, onward)     Start     Ordered   02/18/22 0000  Diet - low sodium heart healthy        02/18/22 1002           Cardiac diet DISCHARGE MEDICATION: Allergies as of 02/18/2022       Reactions   Bee  Pollen Anaphylaxis   Bee Venom Shortness Of Breath   Tomato Hives        Medication List     STOP taking these medications    clopidogrel 75 MG tablet Commonly known as: PLAVIX       TAKE these medications    albuterol (2.5 MG/3ML) 0.083% nebulizer solution Commonly known as: PROVENTIL Take 3 mLs (2.5 mg total) by nebulization every 8 (eight) hours as needed for wheezing or shortness of breath.   albuterol 108 (90 Base) MCG/ACT inhaler Commonly known as: VENTOLIN HFA Inhale 2 puffs  into the lungs every 6 (six) hours as needed for wheezing or shortness of breath.   aspirin EC 81 MG tablet Take 1 tablet (81 mg total) by mouth daily.   atorvastatin 40 MG tablet Commonly known as: LIPITOR Take 1 tablet (40 mg total) by mouth daily.   benzonatate 100 MG capsule Commonly known as: TESSALON Take 1 capsule (100 mg total) by mouth 2 (two) times daily as needed for cough.   cephALEXin 500 MG capsule Commonly known as: KEFLEX Take 1 capsule (500 mg total) by mouth every 12 (twelve) hours for 7 days.   metoprolol succinate 25 MG 24 hr tablet Commonly known as: TOPROL-XL TAKE 1 TABLET BY MOUTH EVERY DAY WITH OR IMMEDIATELY FOLLOWING A MEAL   PARoxetine 10 MG tablet Commonly known as: PAXIL TAKE 1 TABLET BY MOUTH EVERY MORNING   Spiriva Respimat 2.5 MCG/ACT Aers Generic drug: Tiotropium Bromide Monohydrate Inhale 2 puffs into the lungs daily.   telmisartan 40 MG tablet Commonly known as: MICARDIS Take 1 tablet (40 mg total) by mouth daily.   triamterene-hydrochlorothiazide 37.5-25 MG capsule Commonly known as: DYAZIDE Take 1 each (1 capsule total) by mouth daily.        Follow-up Information     Vaught, Jeannie Fend, MD. Go in 1 day.   Specialty: Otolaryngology Why: Friday 9:30AM Contact information: [place] [address] [phone]         Crecencio Mc, MD. Schedule an appointment as soon as possible for a visit in 1 week(s).   Specialty: Internal Medicine Why: BP follow up, hospital follow up Contact information: [address] [place] [address] [phone]                Discharge Exam: Danley Danker Weights   02/17/22 2232  Weight: 68 kg   General exam: awake, alert, no acute distress HEENT: moist mucus membranes, hearing grossly normal  Respiratory system: normal respiratory effort. Cardiovascular system: normal S1/S2, RRR, no pedal edema.   Gastrointestinal system: soft, NT,  ND, no HSM felt, +bowel sounds. Central nervous system: A&O x3. no gross focal neurologic deficits, normal speech Extremities: moves all, no edema, normal tone Skin: dry, intact, normal temperature Psychiatry: normal mood, congruent affect, judgement and insight appear normal   Condition at discharge: stable  The results of significant diagnostics from this hospitalization (including imaging, microbiology, ancillary and laboratory) are listed below for reference.   Imaging Studies: No results found.  Microbiology: Results for orders placed or performed in visit on 11/06/15  Urine culture     Status: None   Collection Time: 11/06/15  2:00 PM   Specimen: Urine  Result Value Ref Range Status   Colony Count NO GROWTH  Final   Organism ID, Bacteria NO GROWTH  Final    Labs: CBC: Recent Labs  Lab 02/17/22 2236 02/18/22 0052 02/18/22 0816  WBC 10.3 17.1*  --  NEUTROABS 4.9  --   --   HGB 14.3 13.7 12.1  HCT 43.4 42.1 36.7  MCV 93.9 97.0  --   PLT 322 313  --    Basic Metabolic Panel: Recent Labs  Lab 02/17/22 2236 02/18/22 0052  NA 140 142  K 3.6 3.7  CL 104 106  CO2 26 27  GLUCOSE 146* 152*  BUN 23 30*  CREATININE 1.17* 1.18*  CALCIUM 10.0 10.0   Liver Function Tests: Recent Labs  Lab 02/17/22 2236  AST 19  ALT 18  ALKPHOS 89  BILITOT 0.6  PROT 7.2  ALBUMIN 4.3   CBG: No results for input(s): "GLUCAP" in the last 168 hours.  Discharge time spent: less than 30 minutes.  Signed: Ezekiel Slocumb, DO [facility] 02/18/2022

## 2022-02-18 NOTE — H&P (Signed)
[place]   PATIENT NAME: Patricia Fields    MR#:  [id number]  DATE OF BIRTH:  1956-04-28  DATE OF ADMISSION:  02/17/2022  PRIMARY CARE PHYSICIAN: Crecencio Mc, MD   Patient is coming from: Home  REQUESTING/REFERRING PHYSICIAN: Ane Payment, MD  CHIEF COMPLAINT:   Chief Complaint  Patient presents with   Epistaxis    HISTORY OF PRESENT ILLNESS:  Patricia Fields is a 66 y.o.  female with medical history significant for COPD, hypertension, dyslipidemia and peripheral vascular disease with history of renal artery infarct, on Plavix, who presented to the ER with acute onset of right-sided epistaxis that started at 10 PM.  She denied any vomiting prior to arrival.  No fever or chills.  No headache or dizziness or blurred vision.  No other bleeding diathesis.  ED Course: When she came to the ER, vital signs revealed elevated blood pressure of 176/91 and later 186/98.  Labs revealed unremarkable CMP.  The patient was typed and crossmatched.  She was O+ with negative antibody screen.    She was given Afrin nasal spray, cauterized with silver nitrate and was ordered 500 mill IV normal saline as well as 4 mg of IV Zofran after having vomiting with blood clots shortly after cauterization and packing.  She will be placed in observation in medical telemetry bed for further monitoring overnight. PAST MEDICAL HISTORY:   Past Medical History:  Diagnosis Date   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ervical region    Chronic kidney disease    renal stent   COPD (chronic obstructive pulmonary disease) (HCC)    History of nephrolithiasis    HOH (hard of hearing)    Hyperlipidemia    Hypertension    Interstitial cystitis    Meniere disease    Peripheral arterial disease ([place]) Feb 2011   by renal artery duplex   Tobacco abuse    one pack daily    PAST SURGICAL HISTORY:   Past Surgical History:  Procedure Laterality Date   ABDOMINAL HYSTERECTOMY     arthroscopy      left knee , remote   BREAST BIOPSY Right 10/23/2019   stereo biopsy calcs/coil clip/ path pending   BREAST EXCISIONAL BIOPSY Left 1980's   NEG   BREAST SURGERY     left breast cyst, benign   CARPAL TUNNEL RELEASE     Dr. Sabra Heck,  right hand    CATARACT EXTRACTION W/PHACO Right 03/05/2015   Procedure: CATARACT EXTRACTION PHACO AND INTRAOCULAR LENS PLACEMENT ([place]);  Surgeon: Lyla Glassing, MD;  Location: [facility] ORS;  Service: Ophthalmology;  Laterality: Right;  [place]: 00:52.3 AP%: 15.2 CDE: 7.96   KNEE ARTHROSCOPY Left    TOTAL ABDOMINAL HYSTERECTOMY W/ BILATERAL SALPINGOOPHORECTOMY  1992   due to uterine tumor, and endometriosis     SOCIAL HISTORY:   Social History   Tobacco Use   Smoking status: Every Day    Packs/day: 0.50    Types: Cigarettes   Smokeless tobacco: Never  Substance Use Topics   Alcohol use: No    Alcohol/week: 0.0 standard drinks of alcohol    FAMILY HISTORY:   Family History  Problem Relation Age of Onset   Coronary artery disease Mother    COPD Mother    Heart disease Mother    Breast cancer Maternal Grandmother    Multiple sclerosis Sister    Stroke Sister     DRUG ALLERGIES:   Allergies  Allergen Reactions  Bee Pollen Anaphylaxis   Bee Venom Shortness Of Breath   Tomato Hives    REVIEW OF SYSTEMS:   ROS As per history of present illness. All pertinent systems were reviewed above. Constitutional, HEENT, cardiovascular, respiratory, GI, GU, musculoskeletal, neuro, psychiatric, endocrine, integumentary and hematologic systems were reviewed and are otherwise negative/unremarkable except for positive findings mentioned above in the HPI.   MEDICATIONS AT HOME:   Prior to Admission medications   Medication Sig Start Date End Date Taking? Authorizing Provider  albuterol (PROVENTIL) (2.5 MG/3ML) 0.083% nebulizer solution Take 3 mLs (2.5 mg total) by nebulization every 8 (eight) hours as needed for wheezing or shortness of breath. 11/24/21  Yes  Crecencio Mc, MD  albuterol (VENTOLIN HFA) 108 (90 Base) MCG/ACT inhaler Inhale 2 puffs into the lungs every 6 (six) hours as needed for wheezing or shortness of breath. 01/25/22  Yes Crecencio Mc, MD  aspirin EC 81 MG tablet Take 1 tablet (81 mg total) by mouth daily. 11/30/17  Yes Crecencio Mc, MD  atorvastatin (LIPITOR) 40 MG tablet Take 1 tablet (40 mg total) by mouth daily. 09/27/21  Yes Crecencio Mc, MD  benzonatate (TESSALON) 100 MG capsule Take 1 capsule (100 mg total) by mouth 2 (two) times daily as needed for cough. 01/13/22  Yes Crecencio Mc, MD  clopidogrel (PLAVIX) 75 MG tablet Take 75 mg by mouth daily. Take 1 tablet (9m)  by mouth once daily   Yes [provider]  metoprolol succinate (TOPROL-XL) 25 MG 24 hr tablet TAKE 1 TABLET BY MOUTH EVERY DAY WITH OR IMMEDIATELY FOLLOWING A MEAL 01/21/22  Yes TCrecencio Mc MD  PARoxetine (PAXIL) 10 MG tablet TAKE 1 TABLET BY MOUTH EVERY MORNING 11/12/21  Yes TCrecencio Mc MD  telmisartan (MICARDIS) 40 MG tablet Take 1 tablet (40 mg total) by mouth daily. 07/28/21  Yes TCrecencio Mc MD  Tiotropium Bromide Monohydrate (SPIRIVA RESPIMAT) 2.5 MCG/ACT AERS Inhale 2 puffs into the lungs daily. 01/30/22  Yes TCrecencio Mc MD  triamterene-hydrochlorothiazide (DYAZIDE) 37.5-25 MG capsule Take 1 each (1 capsule total) by mouth daily. 11/04/21  Yes TCrecencio Mc MD      VITAL SIGNS:  Blood pressure (!) 174/69, pulse 73, temperature 97.7 F (36.5 C), temperature source Oral, resp. rate 18, height 4' 10"  (1.473 m), weight 68 kg, SpO2 97 %.  PHYSICAL EXAMINATION:  Physical Exam  GENERAL:  66y.o.-year-old patient lying in the bed with no acute distress.  EYES: Pupils equal, round, reactive to light and accommodation. No scleral icterus. Extraocular muscles intact.  HEENT: Head atraumatic, normocephalic. Oropharynx with moist mucous membrane and tongue and nose with intact nasal packing there is bloodsoaked in the left  nostril.  NECK:  Supple, no jugular venous distention. No thyroid enlargement, no tenderness.  LUNGS: Normal breath sounds bilaterally, no wheezing, rales,rhonchi or crepitation. No use of accessory muscles of respiration.  CARDIOVASCULAR: Regular rate and rhythm, S1, S2 normal. No murmurs, rubs, or gallops.  ABDOMEN: Soft, nondistended, nontender. Bowel sounds present. No organomegaly or mass.  EXTREMITIES: No pedal edema, cyanosis, or clubbing.  NEUROLOGIC: Cranial nerves II through XII are intact. Muscle strength 5/5 in all extremities. Sensation intact. Gait not checked.  PSYCHIATRIC: The patient is alert and oriented x 3.  Normal affect and good eye contact. SKIN: No obvious rash, lesion, or ulcer.   LABORATORY PANEL:   CBC Recent Labs  Lab 02/18/22 0052 02/18/22 0816  WBC 17.1*  --  HGB 13.7 12.1  HCT 42.1 36.7  PLT 313  --    ------------------------------------------------------------------------------------------------------------------  Chemistries  Recent Labs  Lab 02/17/22 2236 02/18/22 0052  NA 140 142  K 3.6 3.7  CL 104 106  CO2 26 27  GLUCOSE 146* 152*  BUN 23 30*  CREATININE 1.17* 1.18*  CALCIUM 10.0 10.0  AST 19  --   ALT 18  --   ALKPHOS 89  --   BILITOT 0.6  --    ------------------------------------------------------------------------------------------------------------------  Cardiac Enzymes No results for input(s): "TROPONINI" in the last 168 hours. ------------------------------------------------------------------------------------------------------------------  RADIOLOGY:  No results found.    IMPRESSION AND PLAN:  Assessment and Plan: * Epistaxis - This like secondary to hypertensive urgency and antiplatelet therapy with Plavix and aspirin. - Aggressive BP control. - We will hold off her Plavix and aspirin. - She had a nasal packing with adequate hemostasis at this time. - We will monitor H&H.  Hypertensive urgency - We will  continue her antihypertensives and place her on as needed IV labetalol and hydralazine.  Hematemesis - This is likely pseudo met emesis of dried blood that dropped her oropharynx from her nasopharynx and she had subsequent bloody vomitus. - We will monitor serial H&H. - As needed antiemetics will be provided.  Type 2 diabetes mellitus with peripheral vascular disease ([place]) - The patient will be placed on supplement coverage with NovoLog. - We will hold off Plavix and aspirin as mentioned above.  Depression - We will continue Paxil  Dyslipidemia - Statin therapy will be continued.   DVT prophylaxis: SCDs. Advanced Care Planning:  Code Status: full code.  Family Communication:  The plan of care was discussed in details with the patient (and family). I answered all questions. The patient agreed to proceed with the above mentioned plan. Further management will depend upon hospital course. Disposition Plan: Back to previous home environment Consults called: ENT consult can be obtained this morning on an inpatient or can be arranged on an outpatient basis based on patient's stability. All the records are reviewed and case discussed with ED provider.  Status is: Observation  At the time of the admission, it appears that the appropriate admission status for this patient is inpatient.  This is judged to be reasonable and necessary in order to provide the required intensity of service to ensure the patient's safety given the presenting symptoms, physical exam findings and initial radiographic and laboratory data in the context of comorbid conditions.  The patient requires inpatient status due to high intensity of service, high risk of further deterioration and high frequency of surveillance required.  I certify that at the time of admission, it is my clinical judgment that the patient will require inpatient hospital care extending more than 2 midnights.                            Dispo: The  patient is from: Home              Anticipated d/c is to: Home              Patient currently is not medically stable to d/c.              Difficult to place patient: No  Christel Mormon M.D on 02/18/2022 at 9:38 AM  [facility]   From 7 PM-7 AM, contact night-coverage [email or web address]  CC: Primary care physician; Crecencio Mc, MD

## 2022-02-23 DIAGNOSIS — R040 Epistaxis: Secondary | ICD-10-CM | POA: Diagnosis not present

## 2022-03-02 ENCOUNTER — Telehealth: Payer: Self-pay | Admitting: Internal Medicine

## 2022-03-02 NOTE — Telephone Encounter (Signed)
Pt called stating she would like to be called by the cma regarding a medication that she was originally taking

## 2022-03-04 NOTE — Telephone Encounter (Signed)
Spoke with pt and there was a misunderstanding. The procedure was done a year ago in May. Pt stated that she has left a message with the surgeon's office.

## 2022-03-04 NOTE — Telephone Encounter (Signed)
Patient is calling back again regarding her request to speak with CMA about her medicine (blood thinner). She has contacted [facility] and is awaiting a call back.

## 2022-03-04 NOTE — Telephone Encounter (Signed)
Spoke with pt and she stated that she had vascular surgery back in June. She was put on a blood thinner and told she would need to stay on it for a year. Pt stated that on 02/16/2022 she had a bloody nose and was throwing up blood. She went to the ED and has not taken the blood thinner since. Pt is wanting to make sure that is okay.

## 2022-03-29 ENCOUNTER — Other Ambulatory Visit: Payer: Self-pay | Admitting: Internal Medicine

## 2022-04-05 ENCOUNTER — Ambulatory Visit (INDEPENDENT_AMBULATORY_CARE_PROVIDER_SITE_OTHER): Payer: Medicare HMO

## 2022-04-05 ENCOUNTER — Telehealth: Payer: Self-pay

## 2022-04-05 VITALS — Ht <= 58 in | Wt 150.0 lb

## 2022-04-05 DIAGNOSIS — Z0000 Encounter for general adult medical examination without abnormal findings: Secondary | ICD-10-CM

## 2022-04-05 DIAGNOSIS — Z780 Asymptomatic menopausal state: Secondary | ICD-10-CM

## 2022-04-05 NOTE — Patient Instructions (Addendum)
  Patricia Fields , Thank you for taking time to come for your Medicare Wellness Visit. I appreciate your ongoing commitment to your health goals. Please review the following plan we discussed and let me know if I can assist you in the future.   These are the goals we discussed:   Goals Addressed               This Visit's Progress     Patient Stated     Healthy lifestyle (pt-stated)   On track     Healthy diet Stay active      This is a list of the screening recommended for you and due dates:  Health Maintenance  Topic Date Due   Eye exam for diabetics  10/30/2019   DEXA scan (bone density measurement)  Never done   Mammogram  04/05/2020   Colon Cancer Screening  03/19/2022   Flu Shot  03/22/2022   Tetanus Vaccine  05/22/2022*   Zoster (Shingles) Vaccine (1 of 2) 07/06/2022*   Hemoglobin A1C  07/30/2022   Complete foot exam   01/14/2023   Pneumonia Vaccine  Completed   Hepatitis C Screening: USPSTF Recommendation to screen - Ages 5-79 yo.  Completed   HPV Vaccine  Aged Out   COVID-19 Vaccine  Discontinued  *Topic was postponed. The date shown is not the original due date.

## 2022-04-05 NOTE — Telephone Encounter (Signed)
Opened in error

## 2022-04-05 NOTE — Progress Notes (Signed)
Subjective:   Patricia Fields is a 66 y.o. female who presents for Medicare Annual (Subsequent) preventive examination.  Review of Systems    No ROS.  Medicare Wellness Virtual Visit.  Visual/audio telehealth visit, UTA vital signs.   See social history for additional risk factors.         Objective:    Today's Vitals   04/05/22 0846  Weight: 150 lb (68 kg)  Height: 4' 10"  (1.473 m)   Body mass index is 31.35 kg/m.     04/05/2022    8:45 AM 02/17/2022   10:34 PM 02/17/2021   12:40 PM 11/10/2020    9:14 AM 01/17/2020    9:51 AM 01/16/2019   11:21 AM 01/09/2018   10:49 AM  Advanced Directives  Does Patient Have a Medical Advance Directive? No No No No No Yes No  Type of Teacher, early years/pre;Living will   Does patient want to make changes to medical advance directive?      No - Patient declined   Copy of [place] in Chart?      Yes - validated most recent copy scanned in chart (See row information)   Would patient like information on creating a medical advance directive? No - Patient declined  No - Patient declined No - Patient declined No - Patient declined  No - Patient declined    Current Medications (verified) Outpatient Encounter Medications as of 04/05/2022  Medication Sig   albuterol (PROVENTIL) (2.5 MG/3ML) 0.083% nebulizer solution Take 3 mLs (2.5 mg total) by nebulization every 8 (eight) hours as needed for wheezing or shortness of breath.   albuterol (VENTOLIN HFA) 108 (90 Base) MCG/ACT inhaler Inhale 2 puffs into the lungs every 6 (six) hours as needed for wheezing or shortness of breath.   aspirin EC 81 MG tablet Take 1 tablet (81 mg total) by mouth daily.   atorvastatin (LIPITOR) 40 MG tablet TAKE 1 TABLET(40 MG) BY MOUTH DAILY   benzonatate (TESSALON) 100 MG capsule Take 1 capsule (100 mg total) by mouth 2 (two) times daily as needed for cough.   metoprolol succinate (TOPROL-XL) 25 MG 24 hr tablet TAKE 1 TABLET BY  MOUTH EVERY DAY WITH OR IMMEDIATELY FOLLOWING A MEAL   PARoxetine (PAXIL) 10 MG tablet TAKE 1 TABLET BY MOUTH EVERY MORNING   telmisartan (MICARDIS) 40 MG tablet Take 1 tablet (40 mg total) by mouth daily.   Tiotropium Bromide Monohydrate (SPIRIVA RESPIMAT) 2.5 MCG/ACT AERS Inhale 2 puffs into the lungs daily.   triamterene-hydrochlorothiazide (DYAZIDE) 37.5-25 MG capsule Take 1 each (1 capsule total) by mouth daily.   No facility-administered encounter medications on file as of 04/05/2022.    Allergies (verified) Bee pollen, Bee venom, and Tomato   History: Past Medical History:  Diagnosis Date   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ervical region    Chronic kidney disease    renal stent   COPD (chronic obstructive pulmonary disease) (HCC)    History of nephrolithiasis    HOH (hard of hearing)    Hyperlipidemia    Hypertension    Interstitial cystitis    Meniere disease    Peripheral arterial disease ([place]) Feb 2011   by renal artery duplex   Tobacco abuse    one pack daily   Past Surgical History:  Procedure Laterality Date   ABDOMINAL HYSTERECTOMY     arthroscopy     left knee , remote  BREAST BIOPSY Right 10/23/2019   stereo biopsy calcs/coil clip/ path pending   BREAST EXCISIONAL BIOPSY Left 1980's   NEG   BREAST SURGERY     left breast cyst, benign   CARPAL TUNNEL RELEASE     Dr. Sabra Heck,  right hand    CATARACT EXTRACTION W/PHACO Right 03/05/2015   Procedure: CATARACT EXTRACTION PHACO AND INTRAOCULAR LENS PLACEMENT ([place]);  Surgeon: Lyla Glassing, MD;  Location: [facility] ORS;  Service: Ophthalmology;  Laterality: Right;  [place]: 00:52.3 AP%: 15.2 CDE: 7.96   KNEE ARTHROSCOPY Left    TOTAL ABDOMINAL HYSTERECTOMY W/ BILATERAL SALPINGOOPHORECTOMY  1992   due to uterine tumor, and endometriosis    Family History  Problem Relation Age of Onset   Coronary artery disease Mother    COPD Mother    Heart disease Mother    Breast cancer Maternal Grandmother     Multiple sclerosis Sister    Stroke Sister    Social History   Socioeconomic History   Marital status: Single    Spouse name: Not on file   Number of children: 1   Years of education: 10   Highest education level: 10th grade  Occupational History   Occupation: Disabled  Tobacco Use   Smoking status: Every Day    Packs/day: 1.00    Types: Cigarettes   Smokeless tobacco: Never  Vaping Use   Vaping Use: Never used  Substance and Sexual Activity   Alcohol use: No    Alcohol/week: 0.0 standard drinks of alcohol   Drug use: No   Sexual activity: Not Currently  Other Topics Concern   Not on file  Social History Narrative   Not on file   Social Determinants of Health   Financial Resource Strain: Low Risk  (04/05/2022)   Overall Financial Resource Strain (CARDIA)    Difficulty of Paying Living Expenses: Not hard at all  Food Insecurity: No Food Insecurity (04/05/2022)   Hunger Vital Sign    Worried About Running Out of Food in the Last Year: Never true    [place] in the Last Year: Never true  Transportation Needs: No Transportation Needs (04/05/2022)   PRAPARE - Hydrologist (Medical): No    Lack of Transportation (Non-Medical): No  Physical Activity: Unknown (04/05/2022)   Exercise Vital Sign    Days of Exercise per Week: 0 days    Minutes of Exercise per Session: Not on file  Stress: No Stress Concern Present (04/05/2022)   [place]    Feeling of Stress : Not at all  Social Connections: Unknown (04/05/2022)   Social Connection and Isolation Panel [NHANES]    Frequency of Communication with Friends and Family: More than three times a week    Frequency of Social Gatherings with Friends and Family: More than three times a week    Attends Religious Services: Never    Marine scientist or Organizations: No    Attends Music therapist: Never    Marital  Status: Not on file    Tobacco Counseling Ready to quit: Not Answered Counseling given: Not Answered   Clinical Intake:  Pre-visit preparation completed: Yes        Diabetes: No  How often do you need to have someone help you when you read instructions, pamphlets, or other written materials from your doctor or pharmacy?: 1 - Never   Interpreter Needed?: No    Activities  of Daily Living    04/05/2022    8:52 AM  In your present state of health, do you have any difficulty performing the following activities:  Hearing? 1  Comment Hearing aids  Vision? 0  Difficulty concentrating or making decisions? 0  Walking or climbing stairs? 0  Dressing or bathing? 0  Doing errands, shopping? 1  Comment [place] and eating ? N  Using the Toilet? N  In the past six months, have you accidently leaked urine? Y  Do you have problems with loss of bowel control? N  Managing your Medications? N  Managing your Finances? N  Housekeeping or managing your Housekeeping? N   Patient Care Team: Crecencio Mc, MD as PCP - General (Internal Medicine)  Indicate any recent Medical Services you may have received from other than [facility] providers in the past year (date may be approximate).     Assessment:   This is a routine wellness examination for Shadell.  Virtual Visit via Telephone Note  I connected with  [name] on 04/05/22 at  8:30 AM EDT by telephone and verified that I am speaking with the correct person using two identifiers.  Location: Patient: home Provider: office Persons participating in the virtual visit: patient/Nurse Health Advisor   I discussed the limitations of performing an evaluation and management service by telehealth. We continued and completed visit with audio only. Some vital signs may be absent or patient reported.   Hearing/Vision screen Hearing Screening - Comments:: Visits every 6 months  Hearing aid, bilateral Vision Screening -  Comments:: Followed by [facility] Wears corrective lenses They have seen their ophthalmologist in the last 12 months.   Dietary issues and exercise activities discussed:   Healthy diet Good water intake   Goals Addressed               This Visit's Progress     Patient Stated     Healthy lifestyle (pt-stated)   On track     Healthy diet Stay active      Other     Quit Smoking        Currently smokes 1 pack daily       Depression Screen    04/05/2022    8:50 AM 01/27/2022    4:45 PM 01/13/2022   12:38 PM 07/28/2021    9:16 AM 02/17/2021   12:38 PM 11/10/2020    9:13 AM 10/16/2020    8:25 AM  PHQ 2/9 Scores  PHQ - 2 Score 0 0 0 0 0 0 0    Fall Risk    04/05/2022    8:52 AM 01/27/2022    4:45 PM 01/13/2022   12:37 PM 07/28/2021    9:15 AM 01/13/2021    9:48 AM  Fall Risk   Falls in the past year? 1 1 1 1 1   Number falls in past yr:  0 1 1 0  Injury with Fall?  0 0 0 0  Risk for fall due to :  History of fall(s) History of fall(s) History of fall(s) History of fall(s)  Follow up Falls evaluation completed Falls evaluation completed Falls evaluation completed Falls evaluation completed Falls evaluation completed   [place]: Home free of loose throw rugs in walkways, pet beds, electrical cords, etc? Yes  Adequate lighting in your home to reduce risk of falls? Yes   ASSISTIVE DEVICES UTILIZED TO PREVENT FALLS: Life alert? No  Use of a cane, walker or w/c? No   TIMED UP AND GO: Was the test performed? No .   Cognitive Function: Patient is alert and oriented x3.  Denies difficulty focusing, concentrating memory loss.  Enjoys brain health stimulating activities.  Manages her own medication and finances.  100% independent.       12/16/2016   10:36 AM  MMSE - Mini Mental State Exam  Orientation to time 5  Orientation to Place 5  Registration 3  Attention/ Calculation 5  Recall 3  Language- name 2 objects 2  Language-  repeat 1  Language- follow 3 step command 3  Language- read & follow direction 1  Write a sentence 1  Copy design 1  Total score 30        01/17/2020   10:04 AM 01/16/2019   11:23 AM 01/09/2018   11:01 AM  6CIT Screen  What Year? 0 points 0 points 0 points  What month? 0 points 0 points 0 points  What time?  0 points 0 points  Count back from 20 0 points 0 points 0 points  Months in reverse 2 points 0 points 0 points  Repeat phrase 2 points 0 points 0 points  Total Score  0 points 0 points   Immunizations Immunization History  Administered Date(s) Administered   Fluad Quad(high Dose 65+) 10/16/2020   Influenza Split 07/25/2011, 09/27/2012   Influenza,inj,Quad PF,6+ Mos 05/29/2013, 08/14/2014, 11/06/2015, 09/19/2016, 07/12/2018   PFIZER(Purple Top)SARS-COV-2 Vaccination 11/29/2019, 12/20/2019   PNEUMOCOCCAL CONJUGATE-20 01/28/2022   Pneumococcal Conjugate-13 05/29/2013   Pneumococcal Polysaccharide-23 08/14/2014   Tdap 07/25/2011   Zoster, Live 08/20/2014   TDAP status: Due, Education has been provided regarding the importance of this vaccine. Advised may receive this vaccine at local pharmacy or Health Dept. Aware to provide a copy of the vaccination record if obtained from local pharmacy or Health Dept. Verbalized acceptance and understanding.  Shingrix Completed?: No.    Education has been provided regarding the importance of this vaccine. Patient has been advised to call insurance company to determine out of pocket expense if they have not yet received this vaccine. Advised may also receive vaccine at local pharmacy or Health Dept. Verbalized acceptance and understanding.  Screening Tests Health Maintenance  Topic Date Due   OPHTHALMOLOGY EXAM  10/30/2019   DEXA SCAN  Never done   MAMMOGRAM  04/05/2020   COLONOSCOPY (Pts 45-17yr Insurance coverage will need to be confirmed)  03/19/2022   INFLUENZA VACCINE  03/22/2022   TETANUS/TDAP  05/22/2022 (Originally 07/24/2021)    Zoster Vaccines- Shingrix (1 of 2) 07/06/2022 (Originally 04/02/1975)   HEMOGLOBIN A1C  07/30/2022   FOOT EXAM  01/14/2023   Pneumonia Vaccine 66 Years old  Completed   Hepatitis C Screening  Completed   HPV VACCINES  Aged Out   COVID-19 Vaccine  Discontinued   Health Maintenance Health Maintenance Due  Topic Date Due   OPHTHALMOLOGY EXAM  10/30/2019   DEXA SCAN  Never done   MAMMOGRAM  04/05/2020   COLONOSCOPY (Pts 45-412yrInsurance coverage will need to be confirmed)  03/19/2022   INFLUENZA VACCINE  03/22/2022   Colonoscopy- declined.  Cologuard. Reports kit sent/mailed. Unknown delivery location since recently moved. Request new kit be ordered. Added request to upcoming appointment for reorder once address is changed in system.    Mammogram- plans to call and schedule an appointment. Previously ordered 01/28/22. Notes she has the point of contact.   Bone density- agrees to schedule when  calling to schedule mammogram. [phone].  DG Chest 2 View: completed 01/14/22.   Vision Screening: Recommended annual ophthalmology exams for early detection of glaucoma and other disorders of the eye.  Dental Screening: Recommended annual dental exams for proper oral hygiene  Community Resource Referral / Chronic Care Management: CRR required this visit?  No   CCM required this visit?  No      Plan:   Keep all routine maintenance appointments.   I have personally reviewed and noted the following in the patient's chart:   Medical and social history Use of alcohol, tobacco or illicit drugs  Current medications and supplements including opioid prescriptions.  Functional ability and status Nutritional status Physical activity Advanced directives List of other physicians Hospitalizations, surgeries, and ER visits in previous 12 months Vitals Screenings to include cognitive, depression, and falls Referrals and appointments  In addition, I have reviewed and discussed with patient  certain preventive protocols, quality metrics, and best practice recommendations. A written personalized care plan for preventive services as well as general preventive health recommendations were provided to patient.     Varney Biles, LPN   [id number]

## 2022-04-06 ENCOUNTER — Ambulatory Visit: Payer: Medicare HMO

## 2022-04-21 ENCOUNTER — Other Ambulatory Visit: Payer: Self-pay

## 2022-04-21 MED ORDER — TELMISARTAN 40 MG PO TABS: 40.0000 mg | ORAL_TABLET | Freq: Every day | ORAL | 1 refills | Status: DC

## 2022-04-28 DIAGNOSIS — R040 Epistaxis: Secondary | ICD-10-CM | POA: Diagnosis not present

## 2022-04-28 DIAGNOSIS — H8109 Meniere's disease, unspecified ear: Secondary | ICD-10-CM | POA: Diagnosis not present

## 2022-05-02 ENCOUNTER — Encounter: Payer: Self-pay | Admitting: Internal Medicine

## 2022-05-02 ENCOUNTER — Ambulatory Visit (INDEPENDENT_AMBULATORY_CARE_PROVIDER_SITE_OTHER): Payer: Medicare HMO | Admitting: Internal Medicine

## 2022-05-02 VITALS — BP 144/68 | HR 83 | Temp 97.6°F | Ht <= 58 in | Wt 153.8 lb

## 2022-05-02 DIAGNOSIS — Z716 Tobacco abuse counseling: Secondary | ICD-10-CM

## 2022-05-02 DIAGNOSIS — E669 Obesity, unspecified: Secondary | ICD-10-CM | POA: Diagnosis not present

## 2022-05-02 DIAGNOSIS — E1151 Type 2 diabetes mellitus with diabetic peripheral angiopathy without gangrene: Secondary | ICD-10-CM

## 2022-05-02 DIAGNOSIS — F411 Generalized anxiety disorder: Secondary | ICD-10-CM

## 2022-05-02 DIAGNOSIS — E1121 Type 2 diabetes mellitus with diabetic nephropathy: Secondary | ICD-10-CM | POA: Diagnosis not present

## 2022-05-02 DIAGNOSIS — J449 Chronic obstructive pulmonary disease, unspecified: Secondary | ICD-10-CM

## 2022-05-02 DIAGNOSIS — E785 Hyperlipidemia, unspecified: Secondary | ICD-10-CM

## 2022-05-02 DIAGNOSIS — R921 Mammographic calcification found on diagnostic imaging of breast: Secondary | ICD-10-CM | POA: Diagnosis not present

## 2022-05-02 DIAGNOSIS — IMO0001 Reserved for inherently not codable concepts without codable children: Secondary | ICD-10-CM

## 2022-05-02 DIAGNOSIS — E66811 Obesity, class 1: Secondary | ICD-10-CM

## 2022-05-02 DIAGNOSIS — I150 Renovascular hypertension: Secondary | ICD-10-CM

## 2022-05-02 DIAGNOSIS — H8109 Meniere's disease, unspecified ear: Secondary | ICD-10-CM | POA: Diagnosis not present

## 2022-05-02 DIAGNOSIS — Z1211 Encounter for screening for malignant neoplasm of colon: Secondary | ICD-10-CM | POA: Diagnosis not present

## 2022-05-02 DIAGNOSIS — I358 Other nonrheumatic aortic valve disorders: Secondary | ICD-10-CM | POA: Insufficient documentation

## 2022-05-02 LAB — LIPID PANEL
Cholesterol: 161 mg/dL (ref 0–200)
HDL: 60.2 mg/dL (ref >39.00)
LDL Cholesterol: 85 mg/dL (ref 0–99)
NonHDL: 100.61
Total CHOL/HDL Ratio: 3
Triglycerides: 76 mg/dL (ref 0.0–149.0)
VLDL: 15.2 mg/dL (ref 0.0–40.0)

## 2022-05-02 LAB — COMPREHENSIVE METABOLIC PANEL
ALT: 12 U/L (ref 0–35)
AST: 11 U/L (ref 0–37)
Albumin: 4 g/dL (ref 3.5–5.2)
Alkaline Phosphatase: 82 U/L (ref 39–117)
BUN: 23 mg/dL (ref 6–23)
CO2: 23 mEq/L (ref 19–32)
Calcium: 9.7 mg/dL (ref 8.4–10.5)
Chloride: 108 mEq/L (ref 96–112)
Creatinine, Ser: 1.14 mg/dL (ref 0.40–1.20)
GFR: 49.96 mL/min — ABNORMAL LOW (ref >60.00)
Glucose, Bld: 117 mg/dL — ABNORMAL HIGH (ref 70–99)
Potassium: 3.9 mEq/L (ref 3.5–5.1)
Sodium: 139 mEq/L (ref 135–145)
Total Bilirubin: 0.3 mg/dL (ref 0.2–1.2)
Total Protein: 6.6 g/dL (ref 6.0–8.3)

## 2022-05-02 LAB — HEMOGLOBIN A1C: Hgb A1c MFr Bld: 6.4 % (ref 4.6–6.5)

## 2022-05-02 LAB — LDL CHOLESTEROL, DIRECT: Direct LDL: 81 mg/dL

## 2022-05-02 MED ORDER — TRIAMTERENE-HCTZ 37.5-25 MG PO CAPS: 2.0000 | ORAL_CAPSULE | Freq: Every day | ORAL | 1 refills | Status: DC

## 2022-05-02 MED ORDER — PAROXETINE HCL 20 MG PO TABS: 20.0000 mg | ORAL_TABLET | Freq: Every morning | ORAL | 1 refills | Status: DC

## 2022-05-02 NOTE — Patient Instructions (Signed)
Referral to [facility] Cardiology  Your BP was high today.  The increase in triamterene MIGHT help  ,  but keep in mind  that our goal  for optimal blood pressure management are 130/80 or less  Please check your blood pressure a few times at home and send me the readings so I can determine if you need a change in medication  Your annual mammogram has been ordered.  [facility] will not allow [place] to schedule it for you,  so please  call to make your appointment [phone]

## 2022-05-02 NOTE — Assessment & Plan Note (Signed)
She has increased daily consumption to  3/4 pack  .  Encouraged to continue gradual reduction  Given her PVD/CAD and greatly increased risk of CVA /AMI

## 2022-05-02 NOTE — Assessment & Plan Note (Signed)
Requesting increase In paxil dose due to increased irritability.  Dose increased to 20 mg daily

## 2022-05-02 NOTE — Progress Notes (Signed)
Subjective:  Patient ID: Patricia Fields, female    DOB: Jul 11, 1956  Age: 66 y.o. MRN: [id number]  CC: The primary encounter diagnosis was Colon cancer screening. Diagnoses of Type 2 diabetes mellitus with peripheral vascular disease ([place]), Dyslipidemia, Aortic systolic murmur on examination, Breast calcifications on mammogram, Meniere's disease, unspecified laterality, Obesity (BMI 30.0-34.9), Tobacco abuse counseling, Type 2 diabetes mellitus with diabetic nephropathy, without long-term current use of insulin ([place]), COPD not affecting current episode of care [facility]), Generalized anxiety disorder, and Renovascular hypertension were also pertinent to this visit.   HPI Patricia Fields presents for follow up on multiple chronic issues Chief Complaint  Patient presents with   Follow-up    3 month follow up on diabetes   1) Vertigo. Chronic/Meniere's : had vertigo all weekend.  Saw Dr Pryor Ochoa  who prescribed a   6 day prednisone taper  and suggested an increase in triamterene if BP would allow  2) htn:  does not check at home . But has 2 wrist cuffs.    Taking metoprolol ,   telmisartan and maxzide   2) obesity:  has kept weight off eating more salads.     3) Nicotine dependence :  increased use since last visit DUE TO STRESS (great grandson put the house is up for sale ) .  She has since then  relocated with family and is happy in her new home.  and now ready to start reducing her daily use.  Buys a carton at at [facility]  4) GAD:  wants to increase paxil,  some days feels more irritable,  but not depressed   5)   Outpatient Medications Prior to Visit  Medication Sig Dispense Refill   albuterol (PROVENTIL) (2.5 MG/3ML) 0.083% nebulizer solution Take 3 mLs (2.5 mg total) by nebulization every 8 (eight) hours as needed for wheezing or shortness of breath. 150 mL 1   albuterol (VENTOLIN HFA) 108 (90 Base) MCG/ACT inhaler Inhale 2 puffs into the lungs every 6 (six) hours as needed for wheezing or shortness  of breath. 8 g 2   aspirin EC 81 MG tablet Take 1 tablet (81 mg total) by mouth daily. 90 tablet 2   atorvastatin (LIPITOR) 40 MG tablet TAKE 1 TABLET(40 MG) BY MOUTH DAILY 90 tablet 1   benzonatate (TESSALON) 100 MG capsule Take 1 capsule (100 mg total) by mouth 2 (two) times daily as needed for cough. 2030 capsule 2   metoprolol succinate (TOPROL-XL) 25 MG 24 hr tablet TAKE 1 TABLET BY MOUTH EVERY DAY WITH OR IMMEDIATELY FOLLOWING A MEAL 90 tablet 2   predniSONE (STERAPRED UNI-PAK 21 TAB) 10 MG (21) TBPK tablet Take by mouth as directed.     telmisartan (MICARDIS) 40 MG tablet Take 1 tablet (40 mg total) by mouth daily. 90 tablet 1   Tiotropium Bromide Monohydrate (SPIRIVA RESPIMAT) 2.5 MCG/ACT AERS Inhale 2 puffs into the lungs daily. 12 g 1   PARoxetine (PAXIL) 10 MG tablet TAKE 1 TABLET BY MOUTH EVERY MORNING 90 tablet 1   triamterene-hydrochlorothiazide (DYAZIDE) 37.5-25 MG capsule Take 1 each (1 capsule total) by mouth daily. 90 capsule 1   No facility-administered medications prior to visit.    Review of Systems;  Patient denies headache, fevers, malaise, unintentional weight loss, skin rash, eye pain, sinus congestion and sinus pain, sore throat, dysphagia,  hemoptysis , cough, dyspnea, wheezing, chest pain, palpitations, orthopnea, edema, abdominal pain, nausea, melena, diarrhea, constipation, flank pain, dysuria, hematuria, urinary  Frequency, nocturia,  numbness, tingling, seizures,  Focal weakness, Loss of consciousness,  Tremor, insomnia, depression, anxiety, and suicidal ideation.      Objective:  BP (!) 144/68 (BP Location: Left Arm, Patient Position: Sitting, Cuff Size: Normal)   Pulse 83   Temp 97.6 F (36.4 C) (Oral)   Ht 4' 10"  (1.473 m)   Wt 153 lb 12.8 oz (69.8 kg)   SpO2 97%   BMI 32.14 kg/m   BP Readings from Last 3 Encounters:  05/02/22 (!) 144/68  02/18/22 (!) 174/69  01/28/22 136/76    Wt Readings from Last 3 Encounters:  05/02/22 153 lb 12.8 oz (69.8  kg)  04/05/22 150 lb (68 kg)  02/17/22 150 lb (68 kg)    General appearance: alert, cooperative and appears stated age Ears: normal TM's and external ear canals both ears Throat: lips, mucosa, and tongue normal; teeth and gums normal Neck: no adenopathy, no carotid bruit, supple, symmetrical, trachea midline and thyroid not enlarged, symmetric, no tenderness/mass/nodules Back: symmetric, no curvature. ROM normal. No CVA tenderness. Lungs: clear to auscultation bilaterally Heart: regular rate and rhythm, S1, S2 normal, no murmur, click, rub or gallop Abdomen: soft, non-tender; bowel sounds normal; no masses,  no organomegaly Pulses: 2+ and symmetric Skin: Skin color, texture, turgor normal. No rashes or lesions Lymph nodes: Cervical, supraclavicular, and axillary nodes normal. Neuro:  awake and interactive with normal mood and affect. Higher cortical functions are normal. Speech is clear without word-finding difficulty or dysarthria. Extraocular movements are intact. Visual fields of both eyes are grossly intact. Sensation to light touch is grossly intact bilaterally of upper and lower extremities. Motor examination shows 4+/5 symmetric hand grip and upper extremity and 5/5 lower extremity strength. There is no pronation or drift. Gait is non-ataxic   Lab Results  Component Value Date   HGBA1C 5.8 (H) 01/28/2022   HGBA1C 6.0 07/28/2021   HGBA1C 6.5 01/13/2021    Lab Results  Component Value Date   CREATININE 1.18 (H) 02/18/2022   CREATININE 1.17 (H) 02/17/2022   CREATININE 0.98 02/02/2022    Lab Results  Component Value Date   WBC 17.1 (H) 02/18/2022   HGB 12.1 02/18/2022   HCT 36.7 02/18/2022   PLT 313 02/18/2022   GLUCOSE 152 (H) 02/18/2022   CHOL 177 02/02/2022   TRIG 161.0 (H) 02/02/2022   HDL 58.30 02/02/2022   LDLDIRECT 88.0 02/02/2022   LDLCALC 87 02/02/2022   ALT 18 02/17/2022   AST 19 02/17/2022   NA 142 02/18/2022   K 3.7 02/18/2022   CL 106 02/18/2022    CREATININE 1.18 (H) 02/18/2022   BUN 30 (H) 02/18/2022   CO2 27 02/18/2022   TSH 0.88 11/29/2017   HGBA1C 5.8 (H) 01/28/2022   MICROALBUR 2.2 01/28/2022    No results found.  Assessment & Plan:   Problem List Items Addressed This Visit     Aortic systolic murmur on examination    Referring to cardiology for ECHO and risk stratification given T2DM and tobacco abuse       Relevant Orders   Ambulatory referral to Cardiology   Breast calcifications on mammogram    Mammogram ordered but patient has not called due to life stressors.  Urged to Continue annual screenings.  History of benign breast calcifications March 2021 biopsy reviewed       COPD not affecting current episode of care [facility])    She is asymptomatic currently,, continue Spiriva       Relevant Medications  predniSONE (STERAPRED UNI-PAK 21 TAB) 10 MG (21) TBPK tablet   Dyslipidemia   Relevant Orders   Lipid Profile   Direct LDL   Generalized anxiety disorder    Requesting increase In paxil dose due to increased irritability.  Dose increased to 20 mg daily       Relevant Medications   PARoxetine (PAXIL) 20 MG tablet   Meniere's disease    Increase the maxzide to bid.       Obesity (BMI 30.0-34.9)    Body mass index is 32.14 kg/m.  She has kept the weight off.  Encouraged her to start exercising        Renovascular hypertension    she reports compliance with medication regimen (metoprolol 25 mg telmisartan 40 mg and maxzide 37.5/25)  but has an elevated reading today in office.  She is not using NSAIDs daily.  Discussed goal of 120/70  (130/80 for patients over 70)  to preserve renal function.  She has been asked to check her  BP  at home after increasing maxzide dose (for Meniere's) and  submit readings for evaluation. Renal function, electrolytes and screen for proteinuria are all normal .      Relevant Medications   triamterene-hydrochlorothiazide (DYAZIDE) 37.5-25 MG capsule   Tobacco abuse counseling     She has increased daily consumption to  3/4 pack  .  Encouraged to continue gradual reduction  Given her PVD/CAD and greatly increased risk of CVA /AMI      Type 2 diabetes mellitus with diabetic nephropathy (HCC)    Continue telmisartan  Lab Results  Component Value Date   MICROALBUR 2.2 01/28/2022   MICROALBUR 19.5 (H) 10/16/2020           Type 2 diabetes mellitus with peripheral vascular disease ([place])    Managed without insulin or secretagogues   Lab Results  Component Value Date   HGBA1C 5.8 (H) 01/28/2022   Lab Results  Component Value Date   CHOL 177 02/02/2022   HDL 58.30 02/02/2022   LDLCALC 87 02/02/2022   LDLDIRECT 88.0 02/02/2022   TRIG 161.0 (H) 02/02/2022   CHOLHDL 3 02/02/2022         Relevant Medications   triamterene-hydrochlorothiazide (DYAZIDE) 37.5-25 MG capsule   Other Relevant Orders   HgB A1c   Comp Met (CMET)   Other Visit Diagnoses     Colon cancer screening    -  Primary   Relevant Orders   Cologuard       I spent a total of  37  minutes with this patient in a face to face visit on the date of this encounter reviewing the last office visit with me  in June,    most recent visit with ENT,  patient's diet and exercise habits, previous labs and imaging studies ,   and post visit ordering of testing and therapeutics.    Follow-up: Return in about 3 months (around 08/01/2022) for follow up diabetes.   Crecencio Mc, MD

## 2022-05-02 NOTE — Assessment & Plan Note (Signed)
Mammogram ordered but patient has not called due to life stressors.  Urged to Continue annual screenings.  History of benign breast calcifications March 2021 biopsy reviewed

## 2022-05-02 NOTE — Assessment & Plan Note (Signed)
Continue telmisartan  Lab Results  Component Value Date   MICROALBUR 2.2 01/28/2022   MICROALBUR 19.5 (H) 10/16/2020

## 2022-05-02 NOTE — Assessment & Plan Note (Signed)
Referring to cardiology for ECHO and risk stratification given T2DM and tobacco abuse

## 2022-05-02 NOTE — Assessment & Plan Note (Signed)
Increase the maxzide to bid.

## 2022-05-02 NOTE — Assessment & Plan Note (Signed)
Managed without insulin or secretagogues   Lab Results  Component Value Date   HGBA1C 5.8 (H) 01/28/2022   Lab Results  Component Value Date   CHOL 177 02/02/2022   HDL 58.30 02/02/2022   LDLCALC 87 02/02/2022   LDLDIRECT 88.0 02/02/2022   TRIG 161.0 (H) 02/02/2022   CHOLHDL 3 02/02/2022

## 2022-05-02 NOTE — Assessment & Plan Note (Signed)
she reports compliance with medication regimen (metoprolol 25 mg telmisartan 40 mg and maxzide 37.5/25)  but has an elevated reading today in office.  She is not using NSAIDs daily.  Discussed goal of 120/70  (130/80 for patients over 70)  to preserve renal function.  She has been asked to check her  BP  at home after increasing maxzide dose (for Meniere's) and  submit readings for evaluation. Renal function, electrolytes and screen for proteinuria are all normal .

## 2022-05-02 NOTE — Assessment & Plan Note (Signed)
Body mass index is 32.14 kg/m.  She has kept the weight off.  Encouraged her to start exercising

## 2022-05-02 NOTE — Assessment & Plan Note (Signed)
She is asymptomatic currently,, continue Spiriva

## 2022-05-24 DIAGNOSIS — F17200 Nicotine dependence, unspecified, uncomplicated: Secondary | ICD-10-CM | POA: Diagnosis not present

## 2022-05-24 DIAGNOSIS — Z79899 Other long term (current) drug therapy: Secondary | ICD-10-CM | POA: Diagnosis not present

## 2022-05-24 DIAGNOSIS — Z95828 Presence of other vascular implants and grafts: Secondary | ICD-10-CM | POA: Diagnosis not present

## 2022-05-24 DIAGNOSIS — E785 Hyperlipidemia, unspecified: Secondary | ICD-10-CM | POA: Diagnosis not present

## 2022-05-24 DIAGNOSIS — H8109 Meniere's disease, unspecified ear: Secondary | ICD-10-CM | POA: Diagnosis not present

## 2022-05-24 DIAGNOSIS — Z7982 Long term (current) use of aspirin: Secondary | ICD-10-CM | POA: Diagnosis not present

## 2022-05-24 DIAGNOSIS — E119 Type 2 diabetes mellitus without complications: Secondary | ICD-10-CM | POA: Diagnosis not present

## 2022-05-24 DIAGNOSIS — I10 Essential (primary) hypertension: Secondary | ICD-10-CM | POA: Diagnosis not present

## 2022-05-24 DIAGNOSIS — I701 Atherosclerosis of renal artery: Secondary | ICD-10-CM | POA: Diagnosis not present

## 2022-05-24 DIAGNOSIS — Z7902 Long term (current) use of antithrombotics/antiplatelets: Secondary | ICD-10-CM | POA: Diagnosis not present

## 2022-05-27 DIAGNOSIS — H8103 Meniere's disease, bilateral: Secondary | ICD-10-CM | POA: Diagnosis not present

## 2022-05-27 DIAGNOSIS — R040 Epistaxis: Secondary | ICD-10-CM | POA: Diagnosis not present

## 2022-06-17 ENCOUNTER — Ambulatory Visit: Payer: Medicare HMO | Attending: Cardiology | Admitting: Cardiology

## 2022-06-17 ENCOUNTER — Encounter: Payer: Self-pay | Admitting: Cardiology

## 2022-06-17 VITALS — BP 158/74 | HR 73 | Ht <= 58 in | Wt 152.8 lb

## 2022-06-17 DIAGNOSIS — I10 Essential (primary) hypertension: Secondary | ICD-10-CM

## 2022-06-17 DIAGNOSIS — R011 Cardiac murmur, unspecified: Secondary | ICD-10-CM

## 2022-06-17 DIAGNOSIS — F17200 Nicotine dependence, unspecified, uncomplicated: Secondary | ICD-10-CM

## 2022-06-17 MED ORDER — TELMISARTAN 80 MG PO TABS: 80.0000 mg | ORAL_TABLET | Freq: Every day | ORAL | 3 refills | Status: DC

## 2022-06-17 NOTE — Patient Instructions (Signed)
Medication Instructions:   Your physician has recommended you make the following change in your medication:    INCREASE your Telmisartan to 80 MG once a day.  *If you need a refill on your cardiac medications before your next appointment, please call your pharmacy*   Testing/Procedures:  Your physician has requested that you have an echocardiogram. Echocardiography is a painless test that uses sound waves to create images of your heart. It provides your doctor with information about the size and shape of your heart and how well your heart's chambers and valves are working. This procedure takes approximately one hour. There are no restrictions for this procedure. Please do NOT wear cologne, perfume, aftershave, or lotions (deodorant is allowed). Please arrive 15 minutes prior to your appointment time.    Follow-Up: At [facility], you and your health needs are our priority.  As part of our continuing mission to provide you with exceptional heart care, we have created designated Provider Care Teams.  These Care Teams include your primary Cardiologist (physician) and Advanced Practice Providers (APPs -  Physician Assistants and Nurse Practitioners) who all work together to provide you with the care you need, when you need it.  We recommend signing up for the patient portal called "MyChart".  Sign up information is provided on this After Visit Summary.  MyChart is used to connect with patients for Virtual Visits (Telemedicine).  Patients are able to view lab/test results, encounter notes, upcoming appointments, etc.  Non-urgent messages can be sent to your provider as well.   To learn more about what you can do with MyChart, go to [email or web address].    Your next appointment:   Follow up after Echo   The format for your next appointment:   In Person  Provider:   You may see Kate Sable, MD or one of the following Advanced Practice Providers on your designated Care Team:    Murray Hodgkins, NP Christell Faith, PA-C Cadence Kathlen Mody, PA-C Gerrie Nordmann, NP    Other Instructions   Important Information About Sugar

## 2022-06-17 NOTE — Progress Notes (Signed)
Cardiology Office Note:    Date:  06/17/2022   ID:  Patricia Fields, DOB 1955-09-12, MRN [id number]  PCP:  Crecencio Mc, MD   [facility] Cardiologist:  Kate Sable, MD     Referring MD: Crecencio Mc, MD   Chief Complaint  Patient presents with   New Patient (Initial Visit)    Aortic systolic murmur, HTN history, family Hx    History of Present Illness:    Patricia Fields is a 66 y.o. female with a hx of hypertension, hyperlipidemia, current smoker x25+ years, COPD who presents due to hypertension and systolic murmur.  Patient saw her primary care provider, cardiac murmur was noted on exam.  Denies chest pain or shortness of breath.  Still smokes.  Wants to make sure nothing is wrong with her heart.  Blood pressure is not adequately controlled, usually in the 140s to 150s.  Compliant with medications as prescribed.  Endorses eating healthy, low-salt diet.  Past Medical History:  Diagnosis Date   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ervical region    Chronic kidney disease    renal stent   COPD (chronic obstructive pulmonary disease) (HCC)    History of nephrolithiasis    HOH (hard of hearing)    Hyperlipidemia    Hypertension    Interstitial cystitis    Meniere disease    Peripheral arterial disease ([place]) Feb 2011   by renal artery duplex   Tobacco abuse    one pack daily    Past Surgical History:  Procedure Laterality Date   ABDOMINAL HYSTERECTOMY     arthroscopy     left knee , remote   BREAST BIOPSY Right 10/23/2019   stereo biopsy calcs/coil clip/ path pending   BREAST EXCISIONAL BIOPSY Left 1980's   NEG   BREAST SURGERY     left breast cyst, benign   CARPAL TUNNEL RELEASE     Dr. Sabra Heck,  right hand    CATARACT EXTRACTION W/PHACO Right 03/05/2015   Procedure: CATARACT EXTRACTION PHACO AND INTRAOCULAR LENS PLACEMENT ([place]);  Surgeon: Lyla Glassing, MD;  Location: [facility] ORS;  Service: Ophthalmology;  Laterality: Right;   [place]: 00:52.3 AP%: 15.2 CDE: 7.96   KNEE ARTHROSCOPY Left    TOTAL ABDOMINAL HYSTERECTOMY W/ BILATERAL SALPINGOOPHORECTOMY  1992   due to uterine tumor, and endometriosis     Current Medications: Current Meds  Medication Sig   albuterol (PROVENTIL) (2.5 MG/3ML) 0.083% nebulizer solution Take 3 mLs (2.5 mg total) by nebulization every 8 (eight) hours as needed for wheezing or shortness of breath.   albuterol (VENTOLIN HFA) 108 (90 Base) MCG/ACT inhaler Inhale 2 puffs into the lungs every 6 (six) hours as needed for wheezing or shortness of breath.   aspirin EC 81 MG tablet Take 1 tablet (81 mg total) by mouth daily.   atorvastatin (LIPITOR) 40 MG tablet TAKE 1 TABLET(40 MG) BY MOUTH DAILY   benzonatate (TESSALON) 100 MG capsule Take 1 capsule (100 mg total) by mouth 2 (two) times daily as needed for cough.   metoprolol succinate (TOPROL-XL) 25 MG 24 hr tablet TAKE 1 TABLET BY MOUTH EVERY DAY WITH OR IMMEDIATELY FOLLOWING A MEAL   NON FORMULARY Take by mouth.   PARoxetine (PAXIL) 20 MG tablet Take 1 tablet (20 mg total) by mouth every morning. (Patient taking differently: Take 40 mg by mouth every morning. Two 20mg  each morning)   Tiotropium Bromide Monohydrate (SPIRIVA RESPIMAT) 2.5 MCG/ACT AERS Inhale 2 puffs into  the lungs daily.   triamterene-hydrochlorothiazide (DYAZIDE) 37.5-25 MG capsule Take 2 each (2 capsules total) by mouth daily.   [DISCONTINUED] telmisartan (MICARDIS) 40 MG tablet Take 1 tablet (40 mg total) by mouth daily.     Allergies:   Bee pollen, Bee venom, and Tomato   Social History   Socioeconomic History   Marital status: Single    Spouse name: Not on file   Number of children: 1   Years of education: 10   Highest education level: 10th grade  Occupational History   Occupation: Disabled  Tobacco Use   Smoking status: Every Day    Packs/day: 1.00    Types: Cigarettes   Smokeless tobacco: Never  Vaping Use   Vaping Use: Never used  Substance and Sexual  Activity   Alcohol use: No    Alcohol/week: 0.0 standard drinks of alcohol   Drug use: No   Sexual activity: Not Currently  Other Topics Concern   Not on file  Social History Narrative   Not on file   Social Determinants of Health   Financial Resource Strain: Low Risk  (04/05/2022)   Overall Financial Resource Strain (CARDIA)    Difficulty of Paying Living Expenses: Not hard at all  Food Insecurity: No Food Insecurity (04/05/2022)   Hunger Vital Sign    Worried About Running Out of Food in the Last Year: Never true    Ran Out of Food in the Last Year: Never true  Transportation Needs: No Transportation Needs (04/05/2022)   PRAPARE - Administrator, Civil Service (Medical): No    Lack of Transportation (Non-Medical): No  Physical Activity: Unknown (04/05/2022)   Exercise Vital Sign    Days of Exercise per Week: 0 days    Minutes of Exercise per Session: Not on file  Stress: No Stress Concern Present (04/05/2022)   [employer] of Occupational Health - Occupational Stress Questionnaire    Feeling of Stress : Not at all  Social Connections: Unknown (04/05/2022)   Social Connection and Isolation Panel [NHANES]    Frequency of Communication with Friends and Family: More than three times a week    Frequency of Social Gatherings with Friends and Family: More than three times a week    Attends Religious Services: Never    Database administrator or Organizations: No    Attends Engineer, structural: Never    Marital Status: Not on file     Family History: The patient's family history includes Breast cancer in her maternal grandmother; COPD in her mother; Coronary artery disease in her mother; Heart disease in her mother; Multiple sclerosis in her sister; Stroke in her sister.  ROS:   Please see the history of present illness.     All other systems reviewed and are negative.  EKGs/Labs/Other Studies Reviewed:    The following studies were reviewed  today:   EKG:  EKG is  ordered today.  The ekg ordered today demonstrates normal sinus rhythm, nonspecific ST changes.  Recent Labs: 02/18/2022: Hemoglobin 12.1; Platelets 313 05/02/2022: ALT 12; BUN 23; Creatinine, Ser 1.14; Potassium 3.9; Sodium 139  Recent Lipid Panel    Component Value Date/Time   CHOL 161 05/02/2022 1028   TRIG 76.0 05/02/2022 1028   HDL 60.20 05/02/2022 1028   CHOLHDL 3 05/02/2022 1028   VLDL 15.2 05/02/2022 1028   LDLCALC 85 05/02/2022 1028   LDLDIRECT 81.0 05/02/2022 1028     Risk Assessment/Calculations:    HYPERTENSION CONTROL  Vitals:   06/17/22 0943 06/17/22 0944  BP: (!) 152/78 (!) 158/74    The patient's blood pressure is elevated above target today.  In order to address the patient's elevated BP: A current anti-hypertensive medication was adjusted today.            Physical Exam:    VS:  BP (!) 158/74 (BP Location: Right Arm, Patient Position: Sitting)   Pulse 73   Ht 4\' 10"  (1.473 m)   Wt 152 lb 12.8 oz (69.3 kg)   SpO2 95%   BMI 31.94 kg/m     Wt Readings from Last 3 Encounters:  06/17/22 152 lb 12.8 oz (69.3 kg)  05/02/22 153 lb 12.8 oz (69.8 kg)  04/05/22 150 lb (68 kg)     GEN:  Well nourished, well developed in no acute distress HEENT: Normal NECK: No JVD; No carotid bruits CARDIAC: RRR, faint systolic murmur RESPIRATORY:  Clear to auscultation without rales, wheezing or rhonchi  ABDOMEN: Soft, non-tender, non-distended MUSCULOSKELETAL:  No edema; No deformity  SKIN: Warm and dry NEUROLOGIC:  Alert and oriented x 3 PSYCHIATRIC:  Normal affect   ASSESSMENT:    1. Systolic murmur   2. Primary hypertension   3. Smoking    PLAN:    In order of problems listed above:  Systolic murmur, get echo to evaluate any significant valvular/structural abnormalities. Hypertension, BP elevated, increase telmisartan to 80 mg daily.  Continue Dyazide, Toprol-XL.  Advised to check BP at home and keep log. Current smoker,  smoking cessation advised.  Follow-up in 6 weeks.     Medication Adjustments/Labs and Tests Ordered: Current medicines are reviewed at length with the patient today.  Concerns regarding medicines are outlined above.  Orders Placed This Encounter  Procedures   EKG 12-Lead   ECHOCARDIOGRAM COMPLETE   Meds ordered this encounter  Medications   telmisartan (MICARDIS) 80 MG tablet    Sig: Take 1 tablet (80 mg total) by mouth daily.    Dispense:  30 tablet    Refill:  3    KEEP ON FILE FOR FUTURE REFILLS    Patient Instructions  Medication Instructions:   Your physician has recommended you make the following change in your medication:    INCREASE your Telmisartan to 80 MG once a day.  *If you need a refill on your cardiac medications before your next appointment, please call your pharmacy*   Testing/Procedures:  Your physician has requested that you have an echocardiogram. Echocardiography is a painless test that uses sound waves to create images of your heart. It provides your doctor with information about the size and shape of your heart and how well your heart's chambers and valves are working. This procedure takes approximately one hour. There are no restrictions for this procedure. Please do NOT wear cologne, perfume, aftershave, or lotions (deodorant is allowed). Please arrive 15 minutes prior to your appointment time.    Follow-Up: At [facility], you and your health needs are our priority.  As part of our continuing mission to provide you with exceptional heart care, we have created designated Provider Care Teams.  These Care Teams include your primary Cardiologist (physician) and Advanced Practice Providers (APPs -  Physician Assistants and Nurse Practitioners) who all work together to provide you with the care you need, when you need it.  We recommend signing up for the patient portal called "MyChart".  Sign up information is provided on this After Visit  Summary.  MyChart is used  to connect with patients for Virtual Visits (Telemedicine).  Patients are able to view lab/test results, encounter notes, upcoming appointments, etc.  Non-urgent messages can be sent to your provider as well.   To learn more about what you can do with MyChart, go to [email or web address].    Your next appointment:   Follow up after Echo   The format for your next appointment:   In Person  Provider:   You may see Debbe Odea, MD or one of the following Advanced Practice Providers on your designated Care Team:   Nicolasa Ducking, NP Eula Listen, PA-C Cadence Fransico Michael, PA-C Charlsie Quest, NP    Other Instructions   Important Information About Sugar         Signed, Debbe Odea, MD  06/17/2022 10:29 AM    [facility]

## 2022-07-27 ENCOUNTER — Other Ambulatory Visit: Payer: Self-pay | Admitting: Internal Medicine

## 2022-07-27 DIAGNOSIS — J441 Chronic obstructive pulmonary disease with (acute) exacerbation: Secondary | ICD-10-CM

## 2022-07-29 ENCOUNTER — Other Ambulatory Visit: Payer: Self-pay

## 2022-07-29 MED ORDER — PAROXETINE HCL 40 MG PO TABS: 40.0000 mg | ORAL_TABLET | ORAL | 1 refills | Status: DC

## 2022-08-01 DIAGNOSIS — H903 Sensorineural hearing loss, bilateral: Secondary | ICD-10-CM | POA: Diagnosis not present

## 2022-08-01 DIAGNOSIS — H8109 Meniere's disease, unspecified ear: Secondary | ICD-10-CM | POA: Diagnosis not present

## 2022-08-01 DIAGNOSIS — H8103 Meniere's disease, bilateral: Secondary | ICD-10-CM | POA: Diagnosis not present

## 2022-08-09 ENCOUNTER — Ambulatory Visit: Payer: Medicare HMO | Attending: Cardiology

## 2022-08-09 DIAGNOSIS — R011 Cardiac murmur, unspecified: Secondary | ICD-10-CM

## 2022-08-09 MED ORDER — PERFLUTREN LIPID MICROSPHERE
1.0000 mL | INTRAVENOUS | Status: AC | PRN
  Administered 2022-08-09: 2 mL via INTRAVENOUS

## 2022-08-10 LAB — ECHOCARDIOGRAM COMPLETE
AR max vel: 2.42 cm2
AV Area VTI: 2.78 cm2
AV Area mean vel: 2.63 cm2
AV Mean grad: 4 mmHg
AV Peak grad: 8.8 mmHg
Ao pk vel: 1.48 m/s
Area-P 1/2: 1.67 cm2
S' Lateral: 2 cm

## 2022-08-12 ENCOUNTER — Ambulatory Visit: Payer: Medicare HMO | Admitting: Cardiology

## 2022-08-16 ENCOUNTER — Encounter: Payer: Self-pay | Admitting: Cardiology

## 2022-08-16 ENCOUNTER — Ambulatory Visit: Payer: Medicare HMO | Attending: Cardiology | Admitting: Cardiology

## 2022-08-16 VITALS — BP 124/68 | HR 76 | Ht <= 58 in | Wt 151.4 lb

## 2022-08-16 DIAGNOSIS — F17200 Nicotine dependence, unspecified, uncomplicated: Secondary | ICD-10-CM | POA: Diagnosis not present

## 2022-08-16 DIAGNOSIS — I10 Essential (primary) hypertension: Secondary | ICD-10-CM | POA: Diagnosis not present

## 2022-08-16 DIAGNOSIS — R011 Cardiac murmur, unspecified: Secondary | ICD-10-CM

## 2022-08-16 NOTE — Patient Instructions (Signed)
Medication Instructions:  No changes *If you need a refill on your cardiac medications before your next appointment, please call your pharmacy*   Lab Work: None ordered If you have labs (blood work) drawn today and your tests are completely normal, you will receive your results only by: MyChart Message (if you have MyChart) OR A paper copy in the mail If you have any lab test that is abnormal or we need to change your treatment, we will call you to review the results.   Testing/Procedures: None ordered   Follow-Up: At [facility], you and your health needs are our priority.  As part of our continuing mission to provide you with exceptional heart care, we have created designated Provider Care Teams.  These Care Teams include your primary Cardiologist (physician) and Advanced Practice Providers (APPs -  Physician Assistants and Nurse Practitioners) who all work together to provide you with the care you need, when you need it.  We recommend signing up for the patient portal called "MyChart".  Sign up information is provided on this After Visit Summary.  MyChart is used to connect with patients for Virtual Visits (Telemedicine).  Patients are able to view lab/test results, encounter notes, upcoming appointments, etc.  Non-urgent messages can be sent to your provider as well.   To learn more about what you can do with MyChart, go to [email or web address].    Your next appointment:   Follow up as needed  Important Information About Sugar       

## 2022-08-16 NOTE — Progress Notes (Signed)
Cardiology Office Note:    Date:  08/16/2022   ID:  Patricia Fields, DOB 1955/12/17, MRN [id number]  PCP:  Crecencio Mc, MD   [facility] Cardiologist:  Kate Sable, MD     Referring MD: Crecencio Mc, MD   Chief Complaint  Patient presents with   Follow-up    ECHO follow up, no new cardiac concerns    History of Present Illness:    Patricia Fields is a 66 y.o. female with a hx of hypertension, hyperlipidemia, current smoker x25+ years, COPD who presents for follow-up.  Previously seen due to hypertension and systolic murmur.  Cardiac monitor obtained to evaluate any significant valvular disease.  Telmisartan increased to 80 mg daily.  Tolerating all medications as prescribed, no new cardiac concerns.   Past Medical History:  Diagnosis Date   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ervical region    Chronic kidney disease    renal stent   COPD (chronic obstructive pulmonary disease) (HCC)    History of nephrolithiasis    HOH (hard of hearing)    Hyperlipidemia    Hypertension    Interstitial cystitis    Meniere disease    Peripheral arterial disease ([place]) Feb 2011   by renal artery duplex   Tobacco abuse    one pack daily    Past Surgical History:  Procedure Laterality Date   ABDOMINAL HYSTERECTOMY     arthroscopy     left knee , remote   BREAST BIOPSY Right 10/23/2019   stereo biopsy calcs/coil clip/ path pending   BREAST EXCISIONAL BIOPSY Left 1980's   NEG   BREAST SURGERY     left breast cyst, benign   CARPAL TUNNEL RELEASE     Dr. Sabra Heck,  right hand    CATARACT EXTRACTION W/PHACO Right 03/05/2015   Procedure: CATARACT EXTRACTION PHACO AND INTRAOCULAR LENS PLACEMENT ([place]);  Surgeon: Lyla Glassing, MD;  Location: [facility] ORS;  Service: Ophthalmology;  Laterality: Right;  [place]: 00:52.3 AP%: 15.2 CDE: 7.96   KNEE ARTHROSCOPY Left    TOTAL ABDOMINAL HYSTERECTOMY W/ BILATERAL SALPINGOOPHORECTOMY  1992   due to uterine tumor,  and endometriosis     Current Medications: Current Meds  Medication Sig   albuterol (PROVENTIL) (2.5 MG/3ML) 0.083% nebulizer solution Take 3 mLs (2.5 mg total) by nebulization every 8 (eight) hours as needed for wheezing or shortness of breath.   albuterol (VENTOLIN HFA) 108 (90 Base) MCG/ACT inhaler Inhale 2 puffs into the lungs every 6 (six) hours as needed for wheezing or shortness of breath.   amitriptyline (ELAVIL) 25 MG tablet Take 25 mg by mouth at bedtime.   aspirin EC 81 MG tablet Take 1 tablet (81 mg total) by mouth daily.   atorvastatin (LIPITOR) 40 MG tablet TAKE 1 TABLET(40 MG) BY MOUTH DAILY   benzonatate (TESSALON) 100 MG capsule Take 1 capsule (100 mg total) by mouth 2 (two) times daily as needed for cough.   metoprolol succinate (TOPROL-XL) 25 MG 24 hr tablet TAKE 1 TABLET BY MOUTH EVERY DAY WITH OR IMMEDIATELY FOLLOWING A MEAL   NON FORMULARY Take by mouth.   PARoxetine (PAXIL) 20 MG tablet Take 1 tablet (20 mg total) by mouth every morning. (Patient taking differently: Take 40 mg by mouth every morning. Two 20mg  each morning)   PARoxetine (PAXIL) 40 MG tablet Take 1 tablet (40 mg total) by mouth every morning.   predniSONE (STERAPRED UNI-PAK 21 TAB) 10 MG (21) TBPK tablet Take  by mouth as directed.   SPIRIVA RESPIMAT 2.5 MCG/ACT AERS INHALE 2 PUFFS INTO THE LUNGS ONCE DAILY   telmisartan (MICARDIS) 80 MG tablet Take 1 tablet (80 mg total) by mouth daily.   triamterene-hydrochlorothiazide (DYAZIDE) 37.5-25 MG capsule Take 2 each (2 capsules total) by mouth daily.     Allergies:   Bee pollen, Bee venom, and Tomato   Social History   Socioeconomic History   Marital status: Single    Spouse name: Not on file   Number of children: 1   Years of education: 10   Highest education level: 10th grade  Occupational History   Occupation: Disabled  Tobacco Use   Smoking status: Every Day    Packs/day: 1.00    Types: Cigarettes   Smokeless tobacco: Never  Vaping Use    Vaping Use: Never used  Substance and Sexual Activity   Alcohol use: No    Alcohol/week: 0.0 standard drinks of alcohol   Drug use: No   Sexual activity: Not Currently  Other Topics Concern   Not on file  Social History Narrative   Not on file   Social Determinants of Health   Financial Resource Strain: Low Risk  (04/05/2022)   Overall Financial Resource Strain (CARDIA)    Difficulty of Paying Living Expenses: Not hard at all  Food Insecurity: No Food Insecurity (04/05/2022)   Hunger Vital Sign    Worried About Running Out of Food in the Last Year: Never true    Ran Out of Food in the Last Year: Never true  Transportation Needs: No Transportation Needs (04/05/2022)   PRAPARE - Hydrologist (Medical): No    Lack of Transportation (Non-Medical): No  Physical Activity: Unknown (04/05/2022)   Exercise Vital Sign    Days of Exercise per Week: 0 days    Minutes of Exercise per Session: Not on file  Stress: No Stress Concern Present (04/05/2022)   [place]    Feeling of Stress : Not at all  Social Connections: Unknown (04/05/2022)   Social Connection and Isolation Panel [NHANES]    Frequency of Communication with Friends and Family: More than three times a week    Frequency of Social Gatherings with Friends and Family: More than three times a week    Attends Religious Services: Never    Marine scientist or Organizations: No    Attends Music therapist: Never    Marital Status: Not on file     Family History: The patient's family history includes Breast cancer in her maternal grandmother; COPD in her mother; Coronary artery disease in her mother; Heart disease in her mother; Multiple sclerosis in her sister; Stroke in her sister.  ROS:   Please see the history of present illness.     All other systems reviewed and are negative.  EKGs/Labs/Other Studies Reviewed:     The following studies were reviewed today:   EKG:  EKG not ordered today.   Recent Labs: 02/18/2022: Hemoglobin 12.1; Platelets 313 05/02/2022: ALT 12; BUN 23; Creatinine, Ser 1.14; Potassium 3.9; Sodium 139  Recent Lipid Panel    Component Value Date/Time   CHOL 161 05/02/2022 1028   TRIG 76.0 05/02/2022 1028   HDL 60.20 05/02/2022 1028   CHOLHDL 3 05/02/2022 1028   VLDL 15.2 05/02/2022 1028   LDLCALC 85 05/02/2022 1028   LDLDIRECT 81.0 05/02/2022 1028     Risk Assessment/Calculations:  Physical Exam:    VS:  BP 124/68 (BP Location: Left Arm, Patient Position: Sitting, Cuff Size: Normal)   Pulse 76   Ht 4\' 10"  (1.473 m)   Wt 151 lb 6.4 oz (68.7 kg)   SpO2 98%   BMI 31.64 kg/m     Wt Readings from Last 3 Encounters:  08/16/22 151 lb 6.4 oz (68.7 kg)  06/17/22 152 lb 12.8 oz (69.3 kg)  05/02/22 153 lb 12.8 oz (69.8 kg)     GEN:  Well nourished, well developed in no acute distress HEENT: Normal NECK: No JVD; No carotid bruits CARDIAC: RRR, faint systolic murmur RESPIRATORY:  Clear to auscultation without rales, wheezing or rhonchi  ABDOMEN: Soft, non-tender, non-distended MUSCULOSKELETAL:  No edema; No deformity  SKIN: Warm and dry NEUROLOGIC:  Alert and oriented x 3 PSYCHIATRIC:  Normal affect   ASSESSMENT:    1. Systolic murmur   2. Primary hypertension   3. Smoking     PLAN:    In order of problems listed above:  Systolic murmur, echo shows normal systolic function, EF 60 to 65%, aortic valve sclerosis/calcification with no significant stenosis.  Aortic valve sclerosis likely reason for cardiac murmur. Hypertension, now controlled.  Continue telmisartan 80 mg daily.  Continue Dyazide, Toprol-XL.  Current smoker, smoking cessation again advised.  Follow-up as needed     Medication Adjustments/Labs and Tests Ordered: Current medicines are reviewed at length with the patient today.  Concerns regarding medicines are outlined above.   No orders of the defined types were placed in this encounter.  No orders of the defined types were placed in this encounter.   Patient Instructions  Medication Instructions:  No changes *If you need a refill on your cardiac medications before your next appointment, please call your pharmacy*   Lab Work: None ordered If you have labs (blood work) drawn today and your tests are completely normal, you will receive your results only by: MyChart Message (if you have MyChart) OR A paper copy in the mail If you have any lab test that is abnormal or we need to change your treatment, we will call you to review the results.   Testing/Procedures: None ordered   Follow-Up: At [facility], you and your health needs are our priority.  As part of our continuing mission to provide you with exceptional heart care, we have created designated Provider Care Teams.  These Care Teams include your primary Cardiologist (physician) and Advanced Practice Providers (APPs -  Physician Assistants and Nurse Practitioners) who all work together to provide you with the care you need, when you need it.  We recommend signing up for the patient portal called "MyChart".  Sign up information is provided on this After Visit Summary.  MyChart is used to connect with patients for Virtual Visits (Telemedicine).  Patients are able to view lab/test results, encounter notes, upcoming appointments, etc.  Non-urgent messages can be sent to your provider as well.   To learn more about what you can do with MyChart, go to [facility].    Your next appointment:   Follow up as needed  Important Information About Sugar         Signed, [email or web address], MD  08/16/2022 12:07 PM    [facility]

## 2022-09-01 DIAGNOSIS — H8102 Meniere's disease, left ear: Secondary | ICD-10-CM | POA: Diagnosis not present

## 2022-09-01 DIAGNOSIS — H90A22 Sensorineural hearing loss, unilateral, left ear, with restricted hearing on the contralateral side: Secondary | ICD-10-CM | POA: Diagnosis not present

## 2022-09-02 DIAGNOSIS — R42 Dizziness and giddiness: Secondary | ICD-10-CM | POA: Diagnosis not present

## 2022-10-18 ENCOUNTER — Other Ambulatory Visit: Payer: Self-pay | Admitting: Family

## 2022-10-24 DIAGNOSIS — H8109 Meniere's disease, unspecified ear: Secondary | ICD-10-CM | POA: Diagnosis not present

## 2022-10-24 DIAGNOSIS — H6122 Impacted cerumen, left ear: Secondary | ICD-10-CM | POA: Diagnosis not present

## 2022-10-24 DIAGNOSIS — H903 Sensorineural hearing loss, bilateral: Secondary | ICD-10-CM | POA: Diagnosis not present

## 2022-10-31 ENCOUNTER — Other Ambulatory Visit: Payer: Self-pay

## 2022-10-31 DIAGNOSIS — I10 Essential (primary) hypertension: Secondary | ICD-10-CM

## 2022-10-31 MED ORDER — METOPROLOL SUCCINATE ER 25 MG PO TB24: ORAL_TABLET | ORAL | 1 refills | Status: DC

## 2022-11-05 ENCOUNTER — Other Ambulatory Visit: Payer: Self-pay | Admitting: Internal Medicine

## 2022-11-07 ENCOUNTER — Other Ambulatory Visit: Payer: Self-pay

## 2022-11-07 MED ORDER — TRIAMTERENE-HCTZ 37.5-25 MG PO CAPS: 2.0000 | ORAL_CAPSULE | Freq: Every day | ORAL | 1 refills | Status: DC

## 2022-11-07 NOTE — Telephone Encounter (Signed)
Okay to refill again under your name for 90 day supply?   The original prescription was reordered on 06/17/2022 by Kavin Leech, RN. Renewing this prescription may not be appropriate.

## 2022-11-21 ENCOUNTER — Encounter: Payer: Self-pay | Admitting: Family

## 2022-11-21 ENCOUNTER — Ambulatory Visit (INDEPENDENT_AMBULATORY_CARE_PROVIDER_SITE_OTHER): Payer: Self-pay | Admitting: Family

## 2022-11-21 VITALS — BP 172/90 | HR 97 | Temp 98.3°F | Resp 17 | Ht <= 58 in | Wt 149.5 lb

## 2022-11-21 DIAGNOSIS — I150 Renovascular hypertension: Secondary | ICD-10-CM

## 2022-11-21 DIAGNOSIS — J441 Chronic obstructive pulmonary disease with (acute) exacerbation: Secondary | ICD-10-CM

## 2022-11-21 MED ORDER — BENZONATATE 100 MG PO CAPS: 100.0000 mg | ORAL_CAPSULE | Freq: Three times a day (TID) | ORAL | 1 refills | Status: DC | PRN

## 2022-11-21 MED ORDER — AZITHROMYCIN 250 MG PO TABS: ORAL_TABLET | ORAL | 0 refills | Status: AC

## 2022-11-21 NOTE — Assessment & Plan Note (Addendum)
Blood pressure elevated today.  Patient did not take medications prior to coming in for office visit.  Reiterated the importance of remaining compliant with telmisartan 80 mg, triamterene hydrochlorothiazide 37.5-25 mg daily.  She states that she was told to stop metoprolol succinate 25 mg daily.  I cannot see any record of this in prior visits with PCP, cardiology.  I advised her to resume metoprolol succinate 25 mg daily .she will keep blood pressure log at home and has close follow up with me in 2 weeks , sooner if blood pressure doesn't improve.

## 2022-11-21 NOTE — Assessment & Plan Note (Signed)
Patient is afebrile, no respiratory distress.  Concern for COPD exacerbation with increased cough and increased sputum.  Start azithromycin, Tessalon Perles.  She will let me know how she is doing

## 2022-11-21 NOTE — Patient Instructions (Addendum)
Blood pressure is quite high today.  Please ensure that you take telmisartan, triamterene hydrochlorothiazide as prescribed prior  appointments.  I cannot find any record that states she should stop metoprolol 25 mg daily.  I would advise you to resume.    Please keep a monitor blood pressure at home and call the office if persistently greater than 130/80.  Please start azithromycin, antibiotic.Ensure to take probiotics while on antibiotics and also for 2 weeks after completion. This can either be by eating yogurt daily or taking a probiotic supplement over the counter such as Culturelle.It is important to re-colonize the gut with good bacteria and also to prevent any diarrheal infections associated with antibiotic use.

## 2022-11-21 NOTE — Progress Notes (Signed)
Assessment & Plan:  COPD exacerbation Assessment & Plan: Patient is afebrile, no respiratory distress.  Concern for COPD exacerbation with increased cough and increased sputum.  Start azithromycin, Tessalon Perles.  She will let me know how she is doing  Orders: -     Azithromycin; Take 2 tablets on day 1, then 1 tablet daily on days 2 through 5  Dispense: 6 tablet; Refill: 0 -     Benzonatate; Take 1 capsule (100 mg total) by mouth 3 (three) times daily as needed for cough.  Dispense: 20 capsule; Refill: 1  Renovascular hypertension Assessment & Plan: Blood pressure elevated today.  Patient did not take medications prior to coming in for office visit.  Reiterated the importance of remaining compliant with telmisartan 80 mg, triamterene hydrochlorothiazide 37.5-25 mg daily.  She states that she was told to stop metoprolol succinate 25 mg daily.  I cannot see any record of this in prior visits with PCP, cardiology.  I advised her to resume metoprolol succinate 25 mg daily .she will keep blood pressure log at home and has close follow up with me in 2 weeks , sooner if blood pressure doesn't improve.       Return precautions given.   Risks, benefits, and alternatives of the medications and treatment plan prescribed today were discussed, and patient expressed understanding.   Education regarding symptom management and diagnosis given to patient on AVS either electronically or printed.  Return in about 1 week (around 11/28/2022).  Patricia Paris, FNP  Subjective:    Patient ID: Patricia Fields, female    DOB: November 08, 1955, 67 y.o.   MRN: [id number]  CC: Patricia Fields is a 67 y.o. female who presents today for an acute visit.    HPI: Complains of increased productive cough x 4 days More SOB, wheezing than normal.  She is doing nebulaier at home once daily. Compliant with spiriva.   She has tried cloricidin without relief.   No cp, chills, facial pain, HA.   She has not taken her blood  pressure medications today, including  telmisartan 80 mg daily,triamterene hydrochlorothiazide 37.5-25 mg .   Currently smoking. H/o copd     She is compliant with telmisartan 80 mg daily as prescribed by cardiology.  She is compliant with triamterene hydrochlorothiazide 37.5-25 mg .  she is currently not taking metoprolol succinate25mg  QD.   Follow-up cardiology 08/16/2022.  Continue telmisartan 80mg   daily, Dyazide, Toprol  GFR 49  Treated with prednisone ENT, Dr Owens Shark 10/24/22 for meniere's disease. Planning cochlear implantation.   H/o renal artery stenosis  Allergies: Bee pollen, Bee venom, and Tomato Current Outpatient Medications on File Prior to Visit  Medication Sig Dispense Refill   albuterol (PROVENTIL) (2.5 MG/3ML) 0.083% nebulizer solution Take 3 mLs (2.5 mg total) by nebulization every 8 (eight) hours as needed for wheezing or shortness of breath. 150 mL 1   albuterol (VENTOLIN HFA) 108 (90 Base) MCG/ACT inhaler Inhale 2 puffs into the lungs every 6 (six) hours as needed for wheezing or shortness of breath. 8 g 2   amitriptyline (ELAVIL) 25 MG tablet Take 25 mg by mouth at bedtime.     aspirin EC 81 MG tablet Take 1 tablet (81 mg total) by mouth daily. 90 tablet 2   atorvastatin (LIPITOR) 40 MG tablet TAKE 1 TABLET(40 MG) BY MOUTH DAILY 90 tablet 1   NON FORMULARY Take by mouth.     PARoxetine (PAXIL) 20 MG tablet Take 1 tablet (20 mg  total) by mouth every morning. (Patient taking differently: Take 40 mg by mouth every morning. Two 20mg  each morning) 90 tablet 1   SPIRIVA RESPIMAT 2.5 MCG/ACT AERS INHALE 2 PUFFS INTO THE LUNGS ONCE DAILY 12 g 1   telmisartan (MICARDIS) 80 MG tablet Take 1 tablet (80 mg total) by mouth daily. 30 tablet 3   triamterene-hydrochlorothiazide (DYAZIDE) 37.5-25 MG capsule Take 2 each (2 capsules total) by mouth daily. 180 capsule 1   metoprolol succinate (TOPROL-XL) 25 MG 24 hr tablet TAKE 1 TABLET BY MOUTH EVERY DAY WITH OR IMMEDIATELY  FOLLOWING A MEAL 90 tablet 1   PARoxetine (PAXIL) 40 MG tablet Take 1 tablet (40 mg total) by mouth every morning. (Patient not taking: Reported on 11/21/2022) 90 tablet 1   No current facility-administered medications on file prior to visit.    Review of Systems  Constitutional:  Negative for chills and fever.  HENT:  Positive for congestion.   Respiratory:  Positive for cough and shortness of breath.   Cardiovascular:  Negative for chest pain and palpitations.  Gastrointestinal:  Negative for nausea and vomiting.      Objective:    BP (!) 172/90   Pulse 97   Temp 98.3 F (36.8 C) (Oral)   Resp 17   Ht 4\' 10"  (1.473 m)   Wt 149 lb 8 oz (67.8 kg)   SpO2 93%   BMI 31.25 kg/m   BP Readings from Last 3 Encounters:  11/21/22 (!) 172/90  08/16/22 124/68  06/17/22 (!) 158/74   Wt Readings from Last 3 Encounters:  11/21/22 149 lb 8 oz (67.8 kg)  08/16/22 151 lb 6.4 oz (68.7 kg)  06/17/22 152 lb 12.8 oz (69.3 kg)    Physical Exam Vitals reviewed.  Constitutional:      Appearance: She is well-developed.  HENT:     Head: Normocephalic and atraumatic.     Right Ear: Hearing, tympanic membrane, ear canal and external ear normal. No decreased hearing noted. No drainage, swelling or tenderness. No middle ear effusion. No foreign body. Tympanic membrane is not erythematous or bulging.     Left Ear: Hearing, tympanic membrane, ear canal and external ear normal. No decreased hearing noted. No drainage, swelling or tenderness.  No middle ear effusion. No foreign body. Tympanic membrane is not erythematous or bulging.     Nose: Nose normal. No rhinorrhea.     Right Sinus: No maxillary sinus tenderness or frontal sinus tenderness.     Left Sinus: No maxillary sinus tenderness or frontal sinus tenderness.     Mouth/Throat:     Pharynx: Uvula midline. No oropharyngeal exudate or posterior oropharyngeal erythema.     Tonsils: No tonsillar abscesses.  Eyes:     Conjunctiva/sclera:  Conjunctivae normal.  Cardiovascular:     Rate and Rhythm: Regular rhythm.     Pulses: Normal pulses.     Heart sounds: Normal heart sounds.  Pulmonary:     Effort: Pulmonary effort is normal.     Breath sounds: Normal breath sounds. No wheezing, rhonchi or rales.  Lymphadenopathy:     Head:     Right side of head: No submental, submandibular, tonsillar, preauricular, posterior auricular or occipital adenopathy.     Left side of head: No submental, submandibular, tonsillar, preauricular, posterior auricular or occipital adenopathy.     Cervical: No cervical adenopathy.  Skin:    General: Skin is warm and dry.  Neurological:     Mental Status: She is alert.  Psychiatric:        Speech: Speech normal.        Behavior: Behavior normal.        Thought Content: Thought content normal.

## 2022-11-22 NOTE — Progress Notes (Signed)
Spoke to pt and she stated that she had not been taking the toprol because the cardiologist had taken her off of it, but I informed her to just continue to monitor her bp while not on it . If she continued to get elevated numbers to give [place] a call back and we can get her scheduled for a sooner appt

## 2022-11-28 DIAGNOSIS — H903 Sensorineural hearing loss, bilateral: Secondary | ICD-10-CM | POA: Diagnosis not present

## 2022-11-28 DIAGNOSIS — G3189 Other specified degenerative diseases of nervous system: Secondary | ICD-10-CM | POA: Diagnosis not present

## 2022-11-28 DIAGNOSIS — I6782 Cerebral ischemia: Secondary | ICD-10-CM | POA: Diagnosis not present

## 2022-11-28 DIAGNOSIS — R9089 Other abnormal findings on diagnostic imaging of central nervous system: Secondary | ICD-10-CM | POA: Diagnosis not present

## 2022-12-07 ENCOUNTER — Ambulatory Visit: Payer: Self-pay | Admitting: Family

## 2022-12-20 DIAGNOSIS — Z95828 Presence of other vascular implants and grafts: Secondary | ICD-10-CM | POA: Diagnosis not present

## 2022-12-20 DIAGNOSIS — E785 Hyperlipidemia, unspecified: Secondary | ICD-10-CM | POA: Diagnosis not present

## 2022-12-20 DIAGNOSIS — E119 Type 2 diabetes mellitus without complications: Secondary | ICD-10-CM | POA: Diagnosis not present

## 2022-12-20 DIAGNOSIS — F17210 Nicotine dependence, cigarettes, uncomplicated: Secondary | ICD-10-CM | POA: Diagnosis not present

## 2022-12-20 DIAGNOSIS — I701 Atherosclerosis of renal artery: Secondary | ICD-10-CM | POA: Diagnosis not present

## 2022-12-20 DIAGNOSIS — I10 Essential (primary) hypertension: Secondary | ICD-10-CM | POA: Diagnosis not present

## 2023-01-13 DIAGNOSIS — H905 Unspecified sensorineural hearing loss: Secondary | ICD-10-CM | POA: Diagnosis not present

## 2023-01-13 DIAGNOSIS — H903 Sensorineural hearing loss, bilateral: Secondary | ICD-10-CM | POA: Diagnosis not present

## 2023-01-13 DIAGNOSIS — Z8619 Personal history of other infectious and parasitic diseases: Secondary | ICD-10-CM | POA: Diagnosis not present

## 2023-01-13 DIAGNOSIS — H8102 Meniere's disease, left ear: Secondary | ICD-10-CM | POA: Diagnosis not present

## 2023-01-13 DIAGNOSIS — H8109 Meniere's disease, unspecified ear: Secondary | ICD-10-CM | POA: Diagnosis not present

## 2023-01-21 HISTORY — PX: COCHLEAR IMPLANT: SUR684

## 2023-01-31 ENCOUNTER — Telehealth: Payer: Self-pay | Admitting: Internal Medicine

## 2023-01-31 DIAGNOSIS — Z01818 Encounter for other preprocedural examination: Secondary | ICD-10-CM | POA: Diagnosis not present

## 2023-01-31 DIAGNOSIS — I10 Essential (primary) hypertension: Secondary | ICD-10-CM | POA: Diagnosis not present

## 2023-01-31 DIAGNOSIS — I701 Atherosclerosis of renal artery: Secondary | ICD-10-CM | POA: Diagnosis not present

## 2023-01-31 DIAGNOSIS — H903 Sensorineural hearing loss, bilateral: Secondary | ICD-10-CM | POA: Diagnosis not present

## 2023-01-31 DIAGNOSIS — N3010 Interstitial cystitis (chronic) without hematuria: Secondary | ICD-10-CM | POA: Diagnosis not present

## 2023-01-31 DIAGNOSIS — E669 Obesity, unspecified: Secondary | ICD-10-CM | POA: Diagnosis not present

## 2023-01-31 DIAGNOSIS — Z6830 Body mass index (BMI) 30.0-30.9, adult: Secondary | ICD-10-CM | POA: Diagnosis not present

## 2023-01-31 DIAGNOSIS — Z720 Tobacco use: Secondary | ICD-10-CM | POA: Diagnosis not present

## 2023-01-31 DIAGNOSIS — F411 Generalized anxiety disorder: Secondary | ICD-10-CM | POA: Diagnosis not present

## 2023-01-31 DIAGNOSIS — E1140 Type 2 diabetes mellitus with diabetic neuropathy, unspecified: Secondary | ICD-10-CM | POA: Diagnosis not present

## 2023-01-31 DIAGNOSIS — J449 Chronic obstructive pulmonary disease, unspecified: Secondary | ICD-10-CM | POA: Diagnosis not present

## 2023-01-31 NOTE — Telephone Encounter (Signed)
Pt called in asking to speak to [facility] CMA concerning her Pneumonia shot. She would like a call back.

## 2023-02-01 NOTE — Telephone Encounter (Signed)
Pt is having trouble getting pneumonia shot at [facility] due to conflict in schedule. Pt has been advised to check with other local pharmacies to see who can give it to her before her surgery. She will let us know if they need anything from [place] and where to send it.

## 2023-02-02 ENCOUNTER — Telehealth: Payer: Self-pay | Admitting: Internal Medicine

## 2023-02-02 NOTE — Telephone Encounter (Signed)
Spoke with pt and she needed to know her current dose of the Telmisartan and Paroxetine. I advised pt that she is to be taking 80 mg of Telmisartan and 40 mg of Paroxetine daily based off of last office note from Dr. Darrick Huntsman and the Cardiologist. Pt also wanted to let us know that she is having a cochlear implant on 02/16/2023.

## 2023-02-02 NOTE — Telephone Encounter (Signed)
Pt called in stating that she needs to speck with a nurse asap concerning meds mg? She's not sure how many dosage she should take.. pt would like a call back today please.

## 2023-02-16 DIAGNOSIS — J449 Chronic obstructive pulmonary disease, unspecified: Secondary | ICD-10-CM | POA: Diagnosis not present

## 2023-02-16 DIAGNOSIS — H903 Sensorineural hearing loss, bilateral: Secondary | ICD-10-CM | POA: Diagnosis not present

## 2023-02-16 DIAGNOSIS — Z87891 Personal history of nicotine dependence: Secondary | ICD-10-CM | POA: Diagnosis not present

## 2023-02-16 DIAGNOSIS — H905 Unspecified sensorineural hearing loss: Secondary | ICD-10-CM | POA: Diagnosis not present

## 2023-02-16 DIAGNOSIS — H9042 Sensorineural hearing loss, unilateral, left ear, with unrestricted hearing on the contralateral side: Secondary | ICD-10-CM | POA: Diagnosis not present

## 2023-02-16 DIAGNOSIS — H8102 Meniere's disease, left ear: Secondary | ICD-10-CM | POA: Diagnosis not present

## 2023-02-16 DIAGNOSIS — E119 Type 2 diabetes mellitus without complications: Secondary | ICD-10-CM | POA: Diagnosis not present

## 2023-02-16 DIAGNOSIS — Z9621 Cochlear implant status: Secondary | ICD-10-CM | POA: Diagnosis not present

## 2023-02-16 DIAGNOSIS — I10 Essential (primary) hypertension: Secondary | ICD-10-CM | POA: Diagnosis not present

## 2023-02-17 DIAGNOSIS — Z87891 Personal history of nicotine dependence: Secondary | ICD-10-CM | POA: Diagnosis not present

## 2023-02-17 DIAGNOSIS — H8102 Meniere's disease, left ear: Secondary | ICD-10-CM | POA: Diagnosis not present

## 2023-02-17 DIAGNOSIS — J449 Chronic obstructive pulmonary disease, unspecified: Secondary | ICD-10-CM | POA: Diagnosis not present

## 2023-02-17 DIAGNOSIS — I10 Essential (primary) hypertension: Secondary | ICD-10-CM | POA: Diagnosis not present

## 2023-02-17 DIAGNOSIS — E119 Type 2 diabetes mellitus without complications: Secondary | ICD-10-CM | POA: Diagnosis not present

## 2023-02-17 DIAGNOSIS — H905 Unspecified sensorineural hearing loss: Secondary | ICD-10-CM | POA: Diagnosis not present

## 2023-03-02 DIAGNOSIS — H8102 Meniere's disease, left ear: Secondary | ICD-10-CM | POA: Diagnosis not present

## 2023-03-02 DIAGNOSIS — H9209 Otalgia, unspecified ear: Secondary | ICD-10-CM | POA: Diagnosis not present

## 2023-03-02 DIAGNOSIS — H6123 Impacted cerumen, bilateral: Secondary | ICD-10-CM | POA: Diagnosis not present

## 2023-03-02 DIAGNOSIS — H90A22 Sensorineural hearing loss, unilateral, left ear, with restricted hearing on the contralateral side: Secondary | ICD-10-CM | POA: Diagnosis not present

## 2023-03-07 ENCOUNTER — Ambulatory Visit: Payer: Medicare HMO | Admitting: Internal Medicine

## 2023-03-07 ENCOUNTER — Encounter: Payer: Self-pay | Admitting: Internal Medicine

## 2023-03-07 VITALS — BP 182/76 | HR 80 | Temp 98.1°F | Ht <= 58 in | Wt 157.8 lb

## 2023-03-07 DIAGNOSIS — I10 Essential (primary) hypertension: Secondary | ICD-10-CM

## 2023-03-07 DIAGNOSIS — E1151 Type 2 diabetes mellitus with diabetic peripheral angiopathy without gangrene: Secondary | ICD-10-CM

## 2023-03-07 DIAGNOSIS — E876 Hypokalemia: Secondary | ICD-10-CM | POA: Diagnosis not present

## 2023-03-07 DIAGNOSIS — E782 Mixed hyperlipidemia: Secondary | ICD-10-CM

## 2023-03-07 DIAGNOSIS — J441 Chronic obstructive pulmonary disease with (acute) exacerbation: Secondary | ICD-10-CM | POA: Diagnosis not present

## 2023-03-07 DIAGNOSIS — Z720 Tobacco use: Secondary | ICD-10-CM

## 2023-03-07 DIAGNOSIS — I150 Renovascular hypertension: Secondary | ICD-10-CM

## 2023-03-07 DIAGNOSIS — Z79899 Other long term (current) drug therapy: Secondary | ICD-10-CM

## 2023-03-07 DIAGNOSIS — N393 Stress incontinence (female) (male): Secondary | ICD-10-CM | POA: Diagnosis not present

## 2023-03-07 DIAGNOSIS — E1121 Type 2 diabetes mellitus with diabetic nephropathy: Secondary | ICD-10-CM | POA: Diagnosis not present

## 2023-03-07 DIAGNOSIS — N3090 Cystitis, unspecified without hematuria: Secondary | ICD-10-CM

## 2023-03-07 LAB — COMPREHENSIVE METABOLIC PANEL
ALT: 12 U/L (ref 0–35)
AST: 10 U/L (ref 0–37)
Albumin: 3.7 g/dL (ref 3.5–5.2)
Alkaline Phosphatase: 109 U/L (ref 39–117)
BUN: 12 mg/dL (ref 6–23)
CO2: 27 mEq/L (ref 19–32)
Calcium: 8.9 mg/dL (ref 8.4–10.5)
Chloride: 108 mEq/L (ref 96–112)
Creatinine, Ser: 1.06 mg/dL (ref 0.40–1.20)
GFR: 54.2 mL/min — ABNORMAL LOW (ref >60.00)
Glucose, Bld: 120 mg/dL — ABNORMAL HIGH (ref 70–99)
Potassium: 3.1 mEq/L — ABNORMAL LOW (ref 3.5–5.1)
Sodium: 140 mEq/L (ref 135–145)
Total Bilirubin: 0.6 mg/dL (ref 0.2–1.2)
Total Protein: 6.4 g/dL (ref 6.0–8.3)

## 2023-03-07 LAB — LIPID PANEL
Cholesterol: 133 mg/dL (ref 0–200)
HDL: 39.2 mg/dL (ref >39.00)
LDL Cholesterol: 58 mg/dL (ref 0–99)
NonHDL: 94.25
Total CHOL/HDL Ratio: 3
Triglycerides: 182 mg/dL — ABNORMAL HIGH (ref 0.0–149.0)
VLDL: 36.4 mg/dL (ref 0.0–40.0)

## 2023-03-07 LAB — MICROALBUMIN / CREATININE URINE RATIO
Creatinine,U: 85.7 mg/dL
Microalb Creat Ratio: 4.6 mg/g (ref 0.0–30.0)
Microalb, Ur: 3.9 mg/dL — ABNORMAL HIGH (ref 0.0–1.9)

## 2023-03-07 LAB — URINALYSIS, ROUTINE W REFLEX MICROSCOPIC
Bilirubin Urine: NEGATIVE
Hgb urine dipstick: NEGATIVE
Ketones, ur: NEGATIVE
Nitrite: NEGATIVE
RBC / HPF: NONE SEEN (ref 0–?)
Specific Gravity, Urine: 1.01 (ref 1.000–1.030)
Total Protein, Urine: NEGATIVE
Urine Glucose: NEGATIVE
Urobilinogen, UA: 0.2 (ref 0.0–1.0)
pH: 7 (ref 5.0–8.0)

## 2023-03-07 LAB — CBC WITH DIFFERENTIAL/PLATELET
Basophils Absolute: 0.1 10*3/uL (ref 0.0–0.1)
Basophils Relative: 1.8 % (ref 0.0–3.0)
Eosinophils Absolute: 0.4 10*3/uL (ref 0.0–0.7)
Eosinophils Relative: 5.7 % — ABNORMAL HIGH (ref 0.0–5.0)
HCT: 34.2 % — ABNORMAL LOW (ref 36.0–46.0)
Hemoglobin: 11.6 g/dL — ABNORMAL LOW (ref 12.0–15.0)
Lymphocytes Relative: 30.5 % (ref 12.0–46.0)
Lymphs Abs: 2.1 10*3/uL (ref 0.7–4.0)
MCHC: 34 g/dL (ref 30.0–36.0)
MCV: 94.9 fl (ref 78.0–100.0)
Monocytes Absolute: 0.4 10*3/uL (ref 0.1–1.0)
Monocytes Relative: 6.5 % (ref 3.0–12.0)
Neutro Abs: 3.8 10*3/uL (ref 1.4–7.7)
Neutrophils Relative %: 55.5 % (ref 43.0–77.0)
Platelets: 271 10*3/uL (ref 150.0–400.0)
RBC: 3.61 Mil/uL — ABNORMAL LOW (ref 3.87–5.11)
RDW: 15.3 % (ref 11.5–15.5)
WBC: 6.9 10*3/uL (ref 4.0–10.5)

## 2023-03-07 LAB — TSH: TSH: 1.23 u[IU]/mL (ref 0.35–5.50)

## 2023-03-07 LAB — LDL CHOLESTEROL, DIRECT: Direct LDL: 58 mg/dL

## 2023-03-07 LAB — HEMOGLOBIN A1C: Hgb A1c MFr Bld: 6.2 % (ref 4.6–6.5)

## 2023-03-07 MED ORDER — METOPROLOL SUCCINATE ER 25 MG PO TB24
ORAL_TABLET | ORAL | 1 refills | Status: DC
Start: 2023-03-07 — End: 2023-06-29

## 2023-03-07 MED ORDER — ATORVASTATIN CALCIUM 40 MG PO TABS: 40.0000 mg | ORAL_TABLET | Freq: Every day | ORAL | 1 refills | Status: DC

## 2023-03-07 MED ORDER — SPIRIVA RESPIMAT 2.5 MCG/ACT IN AERS
INHALATION_SPRAY | RESPIRATORY_TRACT | 1 refills | Status: DC
Start: 2023-03-07 — End: 2023-04-12

## 2023-03-07 MED ORDER — TELMISARTAN-HCTZ 80-25 MG PO TABS: 1.0000 | ORAL_TABLET | Freq: Every day | ORAL | 1 refills | Status: DC

## 2023-03-07 MED ORDER — TRIAMTERENE-HCTZ 37.5-25 MG PO CAPS: 2.0000 | ORAL_CAPSULE | Freq: Every day | ORAL | 1 refills | Status: DC

## 2023-03-07 NOTE — Patient Instructions (Signed)
You have "diet controlled diabetes "  since 2022.  As long a your A1c is < 6.5,   no medications will be  prescribed  to lower your blood sugars  Your other medications are to prevent complications of diabetes (kidney disease, heart attack and stroke)  I am  changing your blood pressure medication to telmisartan/hct.  Take it every day in the morning  Return for BP check in 2 weeks    You have stress incontinence.    .Practice your Kegel exercises everytime you pee  We are ruling out bladder infection as a cause,  today

## 2023-03-07 NOTE — Progress Notes (Signed)
Subjective:  Patient ID: Patricia Fields, female    DOB: Mar 17, 1956  Age: 67 y.o. MRN: [id number]  CC: The primary encounter diagnosis was Type 2 diabetes mellitus with peripheral vascular disease (HCC). Diagnoses of Essential hypertension, COPD with acute exacerbation (HCC), Mixed hyperlipidemia, Long-term use of high-risk medication, Female stress incontinence, Type 2 diabetes mellitus with diabetic nephropathy, without long-term current use of insulin (HCC), Tobacco abuse, Renovascular hypertension, Hypokalemia, and Cystitis were also pertinent to this visit.   HPI Patricia Fields presents for  Chief Complaint  Patient presents with   Medical Management of Chronic Issues   1) diet controlled type 2 DM   last a1c 6.4 in Sept . Diagnosed with A1c of 6.5 in 2022 .  Had CBGS and denied the doses of insulin during hospitalization.   2) HTN: not checking at home bc machine at home is "not working right"  . Taking telmisartan 80 mg daily   3) HLD'  4) s/o mastoidectomy June 26,  scheduled for the cochlear implant  July 26..  she has not vertigo since the surgery ( finally, referred by VDr.  aught to Dr Shanna Cisco at [facility] after 10 years)     5) tobacco abuse:  she is finally reay to quit smoking,  wants to use patches   several months  6) stress incontinence   s=for several months.  Wears a padi Outpatient Medications Prior to Visit  Medication Sig Dispense Refill   albuterol (PROVENTIL) (2.5 MG/3ML) 0.083% nebulizer solution Take 3 mLs (2.5 mg total) by nebulization every 8 (eight) hours as needed for wheezing or shortness of breath. 150 mL 1   albuterol (VENTOLIN HFA) 108 (90 Base) MCG/ACT inhaler Inhale 2 puffs into the lungs every 6 (six) hours as needed for wheezing or shortness of breath. 8 g 2   HYDROcodone-acetaminophen (NORCO/VICODIN) 5-325 MG tablet Take 1 tablet by mouth every 6 (six) hours as needed.     PARoxetine (PAXIL) 20 MG tablet Take 1 tablet (20 mg total) by mouth every  morning. (Patient taking differently: Take 20 mg by mouth daily.) 90 tablet 1   atorvastatin (LIPITOR) 40 MG tablet TAKE 1 TABLET(40 MG) BY MOUTH DAILY 90 tablet 1   SPIRIVA RESPIMAT 2.5 MCG/ACT AERS INHALE 2 PUFFS INTO THE LUNGS ONCE DAILY 12 g 1   telmisartan (MICARDIS) 80 MG tablet Take 1 tablet (80 mg total) by mouth daily. 30 tablet 3   amitriptyline (ELAVIL) 25 MG tablet Take 25 mg by mouth at bedtime. (Patient not taking: Reported on 03/07/2023)     aspirin EC 81 MG tablet Take 1 tablet (81 mg total) by mouth daily. (Patient not taking: Reported on 03/07/2023) 90 tablet 2   benzonatate (TESSALON) 100 MG capsule Take 1 capsule (100 mg total) by mouth 3 (three) times daily as needed for cough. (Patient not taking: Reported on 03/07/2023) 20 capsule 1   metoprolol succinate (TOPROL-XL) 25 MG 24 hr tablet TAKE 1 TABLET BY MOUTH EVERY DAY WITH OR IMMEDIATELY FOLLOWING A MEAL (Patient not taking: Reported on 03/07/2023) 90 tablet 1   NON FORMULARY Take by mouth. (Patient not taking: Reported on 03/07/2023)     PARoxetine (PAXIL) 40 MG tablet Take 1 tablet (40 mg total) by mouth every morning. (Patient not taking: Reported on 11/21/2022) 90 tablet 1   triamterene-hydrochlorothiazide (DYAZIDE) 37.5-25 MG capsule Take 2 each (2 capsules total) by mouth daily. (Patient not taking: Reported on 03/07/2023) 180 capsule 1   No facility-administered  medications prior to visit.    Review of Systems;  Patient denies headache, fevers, malaise, unintentional weight loss, skin rash, eye pain, sinus congestion and sinus pain, sore throat, dysphagia,  hemoptysis , cough, dyspnea, wheezing, chest pain, palpitations, orthopnea, edema, abdominal pain, nausea, melena, diarrhea, constipation, flank pain, dysuria, hematuria, urinary  Frequency, nocturia, numbness, tingling, seizures,  Focal weakness, Loss of consciousness,  Tremor, insomnia, depression, anxiety, and suicidal ideation.      Objective:  BP (!) 182/76    Pulse 80   Temp 98.1 F (36.7 C) (Oral)   Ht 4\' 10"  (1.473 m)   Wt 157 lb 12.8 oz (71.6 kg)   SpO2 95%   BMI 32.98 kg/m   BP Readings from Last 3 Encounters:  03/07/23 (!) 182/76  11/21/22 (!) 172/90  08/16/22 124/68    Wt Readings from Last 3 Encounters:  03/07/23 157 lb 12.8 oz (71.6 kg)  11/21/22 149 lb 8 oz (67.8 kg)  08/16/22 151 lb 6.4 oz (68.7 kg)    Physical Exam Vitals reviewed.  Constitutional:      General: She is not in acute distress.    Appearance: Normal appearance. She is normal weight. She is not ill-appearing, toxic-appearing or diaphoretic.     Comments: Looks older than stated years  HENT:     Head: Normocephalic.  Eyes:     General: No scleral icterus.       Right eye: No discharge.        Left eye: No discharge.     Conjunctiva/sclera: Conjunctivae normal.  Cardiovascular:     Rate and Rhythm: Normal rate and regular rhythm.     Heart sounds: Normal heart sounds.  Pulmonary:     Effort: Pulmonary effort is normal. No respiratory distress.     Breath sounds: Normal breath sounds.  Musculoskeletal:        General: Normal range of motion.  Skin:    General: Skin is warm and dry.  Neurological:     General: No focal deficit present.     Mental Status: She is alert and oriented to person, place, and time. Mental status is at baseline.  Psychiatric:        Mood and Affect: Mood normal.        Behavior: Behavior normal.        Thought Content: Thought content normal.        Judgment: Judgment normal.    Lab Results  Component Value Date   HGBA1C 6.2 03/07/2023   HGBA1C 6.4 05/02/2022   HGBA1C 5.8 (H) 01/28/2022    Lab Results  Component Value Date   CREATININE 1.06 03/07/2023   CREATININE 1.14 05/02/2022   CREATININE 1.18 (H) 02/18/2022    Lab Results  Component Value Date   WBC 6.9 03/07/2023   HGB 11.6 (L) 03/07/2023   HCT 34.2 (L) 03/07/2023   PLT 271.0 03/07/2023   GLUCOSE 120 (H) 03/07/2023   CHOL 133 03/07/2023   TRIG  182.0 (H) 03/07/2023   HDL 39.20 03/07/2023   LDLDIRECT 58.0 03/07/2023   LDLCALC 58 03/07/2023   ALT 12 03/07/2023   AST 10 03/07/2023   NA 140 03/07/2023   K 3.1 (L) 03/07/2023   CL 108 03/07/2023   CREATININE 1.06 03/07/2023   BUN 12 03/07/2023   CO2 27 03/07/2023   TSH 1.23 03/07/2023   HGBA1C 6.2 03/07/2023   MICROALBUR 3.9 (H) 03/07/2023    No results found.  Assessment & Plan:  .Type 2  diabetes mellitus with peripheral vascular disease (HCC) -     Hemoglobin A1c -     Comprehensive metabolic panel -     Microalbumin / creatinine urine ratio  Essential hypertension -     Metoprolol Succinate ER; TAKE 1 TABLET BY MOUTH EVERY DAY WITH OR IMMEDIATELY FOLLOWING A MEAL  Dispense: 90 tablet; Refill: 1 -     Comprehensive metabolic panel -     Microalbumin / creatinine urine ratio -     Basic metabolic panel; Future  COPD with acute exacerbation (HCC) -     Spiriva Respimat; INHALE 2 PUFFS INTO THE LUNGS ONCE DAILY  Dispense: 12 g; Refill: 1  Mixed hyperlipidemia -     Lipid panel -     LDL cholesterol, direct  Long-term use of high-risk medication -     TSH -     CBC with Differential/Platelet  Female stress incontinence -     Urinalysis, Routine w reflex microscopic -     Urine Culture  Type 2 diabetes mellitus with diabetic nephropathy, without long-term current use of insulin (HCC) Assessment & Plan:  Historically well-controlled on diet alone .  hemoglobin A1c has been consistently at or  less than 7.0 . Patient is up-to-date on eye exams and foot exam is normal today. Patient has microalbuminuria. Patient is tolerating statin therapy for CAD risk reduction and on ACE/ARB for renal protection and hypertension  Lab Results  Component Value Date   HGBA1C 6.2 03/07/2023   Lab Results  Component Value Date   MICROALBUR 3.9 (H) 03/07/2023   MICROALBUR 2.2 01/28/2022     Lab Results  Component Value Date   CHOL 133 03/07/2023   HDL 39.20 03/07/2023    LDLCALC 58 03/07/2023   LDLDIRECT 58.0 03/07/2023   TRIG 182.0 (H) 03/07/2023   CHOLHDL 3 03/07/2023      Tobacco abuse Assessment & Plan: She has committed herslelf to quit smoking using nicotine patches    Renovascular hypertension Assessment & Plan: she reports compliance with medication regimen (metoprolol 25 mg telmisartan 40 mg   but has an elevated reading today in office.  Maxzide was stopped several weeks ago .  She is not using NSAIDs daily.  Discussed goal of 120/70  (130/80 for patients over 70)  to preserve renal function.  Hypokalemia noted,  likely hypoaldosteronism   will start spironolactone .  Lab Results  Component Value Date   CREATININE 1.06 03/07/2023   Lab Results  Component Value Date   NA 140 03/07/2023   K 3.1 (L) 03/07/2023   CL 108 03/07/2023   CO2 27 03/07/2023      Hypokalemia -     Aldosterone + renin activity w/ ratio; Future  Cystitis Assessment & Plan: E Coli,  cipro rx'd  Daily use of a probiotic advised for 3 weeks.     Other orders -     Atorvastatin Calcium; Take 1 tablet (40 mg total) by mouth daily.  Dispense: 90 tablet; Refill: 1 -     Telmisartan-HCTZ; Take 1 tablet by mouth daily.  Dispense: 90 tablet; Refill: 1 -     Spironolactone; Take 1 tablet (50 mg total) by mouth daily.  Dispense: 90 tablet; Refill: 1 -     Ciprofloxacin HCl; Take 1 tablet (250 mg total) by mouth 2 (two) times daily for 3 days.  Dispense: 6 tablet; Refill: 0     I provided 31 minutes of face-to-face time during this encounter reviewing  patient's last visit with me, patient's  most recent visit with ENT,  recent surgical and non surgical procedures, previous  labs and imaging studies, counseling on currently addressed issues,  and post visit ordering to diagnostics and therapeutics .   Follow-up: Return in about 2 weeks (around 03/21/2023) for blood pressure check.   Sherlene Shams, MD

## 2023-03-09 DIAGNOSIS — N3090 Cystitis, unspecified without hematuria: Secondary | ICD-10-CM | POA: Insufficient documentation

## 2023-03-09 LAB — URINE CULTURE
MICRO NUMBER:: 15206379
SPECIMEN QUALITY:: ADEQUATE

## 2023-03-09 MED ORDER — SPIRONOLACTONE 50 MG PO TABS: 50.0000 mg | ORAL_TABLET | Freq: Every day | ORAL | 1 refills | Status: DC

## 2023-03-09 MED ORDER — CIPROFLOXACIN HCL 250 MG PO TABS: 250.0000 mg | ORAL_TABLET | Freq: Two times a day (BID) | ORAL | 0 refills | Status: AC

## 2023-03-09 NOTE — Assessment & Plan Note (Signed)
she reports compliance with medication regimen (metoprolol 25 mg telmisartan 40 mg   but has an elevated reading today in office.  Maxzide was stopped several weeks ago .  She is not using NSAIDs daily.  Discussed goal of 120/70  (130/80 for patients over 70)  to preserve renal function.  Hypokalemia noted,  likely hypoaldosteronism   will start spironolactone .  Lab Results  Component Value Date   CREATININE 1.06 03/07/2023   Lab Results  Component Value Date   NA 140 03/07/2023   K 3.1 (L) 03/07/2023   CL 108 03/07/2023   CO2 27 03/07/2023

## 2023-03-09 NOTE — Assessment & Plan Note (Signed)
Historically well-controlled on diet alone .  hemoglobin A1c has been consistently at or  less than 7.0 . Patient is up-to-date on eye exams and foot exam is normal today. Patient has microalbuminuria. Patient is tolerating statin therapy for CAD risk reduction and on ACE/ARB for renal protection and hypertension  Lab Results  Component Value Date   HGBA1C 6.2 03/07/2023   Lab Results  Component Value Date   MICROALBUR 3.9 (H) 03/07/2023   MICROALBUR 2.2 01/28/2022     Lab Results  Component Value Date   CHOL 133 03/07/2023   HDL 39.20 03/07/2023   LDLCALC 58 03/07/2023   LDLDIRECT 58.0 03/07/2023   TRIG 182.0 (H) 03/07/2023   CHOLHDL 3 03/07/2023

## 2023-03-09 NOTE — Assessment & Plan Note (Signed)
E Coli,  cipro rx'd  Daily use of a probiotic advised for 3 weeks.

## 2023-03-09 NOTE — Assessment & Plan Note (Signed)
She has committed herslelf to quit smoking using nicotine patches

## 2023-03-14 ENCOUNTER — Other Ambulatory Visit (INDEPENDENT_AMBULATORY_CARE_PROVIDER_SITE_OTHER): Payer: Medicare HMO

## 2023-03-14 DIAGNOSIS — E876 Hypokalemia: Secondary | ICD-10-CM | POA: Diagnosis not present

## 2023-03-14 DIAGNOSIS — I10 Essential (primary) hypertension: Secondary | ICD-10-CM

## 2023-03-14 LAB — BASIC METABOLIC PANEL
BUN: 19 mg/dL (ref 6–23)
CO2: 26 mEq/L (ref 19–32)
Calcium: 9.6 mg/dL (ref 8.4–10.5)
Chloride: 105 mEq/L (ref 96–112)
Creatinine, Ser: 1.27 mg/dL — ABNORMAL HIGH (ref 0.40–1.20)
GFR: 43.63 mL/min — ABNORMAL LOW (ref >60.00)
Glucose, Bld: 130 mg/dL — ABNORMAL HIGH (ref 70–99)
Potassium: 3.4 mEq/L — ABNORMAL LOW (ref 3.5–5.1)
Sodium: 138 mEq/L (ref 135–145)

## 2023-03-17 DIAGNOSIS — H90A22 Sensorineural hearing loss, unilateral, left ear, with restricted hearing on the contralateral side: Secondary | ICD-10-CM | POA: Diagnosis not present

## 2023-03-17 DIAGNOSIS — Z45321 Encounter for adjustment and management of cochlear device: Secondary | ICD-10-CM | POA: Diagnosis not present

## 2023-03-22 ENCOUNTER — Encounter (INDEPENDENT_AMBULATORY_CARE_PROVIDER_SITE_OTHER): Payer: Self-pay

## 2023-03-30 ENCOUNTER — Ambulatory Visit: Payer: Medicare HMO

## 2023-04-07 ENCOUNTER — Ambulatory Visit: Payer: Medicare HMO | Admitting: *Deleted

## 2023-04-07 VITALS — Ht <= 58 in | Wt 146.0 lb

## 2023-04-07 DIAGNOSIS — Z780 Asymptomatic menopausal state: Secondary | ICD-10-CM | POA: Diagnosis not present

## 2023-04-07 DIAGNOSIS — Z0000 Encounter for general adult medical examination without abnormal findings: Secondary | ICD-10-CM | POA: Diagnosis not present

## 2023-04-07 DIAGNOSIS — Z1231 Encounter for screening mammogram for malignant neoplasm of breast: Secondary | ICD-10-CM | POA: Diagnosis not present

## 2023-04-07 NOTE — Progress Notes (Signed)
Subjective:   Patricia Fields is a 67 y.o. female who presents for Medicare Annual (Subsequent) preventive examination.  Visit Complete: Virtual  I connected with  Patricia Fields on 04/07/23 by a audio enabled telemedicine application and verified that I am speaking with the correct person using two identifiers.  Patient Location: Home  Provider Location: Home Office  I discussed the limitations of evaluation and management by telemedicine. The patient expressed understanding and agreed to proceed.  Vital Signs: Unable to obtain new vitals due to this being a telehealth visit.   Review of Systems     Cardiac Risk Factors include: advanced age (>56men, >27 women);obesity (BMI >30kg/m2);diabetes mellitus;dyslipidemia;hypertension;smoking/ tobacco exposure     Objective:    Today's Vitals   04/07/23 0856  Weight: 146 lb (66.2 kg)  Height: 4\' 10"  (1.473 m)   Body mass index is 30.51 kg/m.     04/07/2023    9:28 AM 04/05/2022    8:45 AM 02/17/2022   10:34 PM 02/17/2021   12:40 PM 11/10/2020    9:14 AM 01/17/2020    9:51 AM 01/16/2019   11:21 AM  Advanced Directives  Does Patient Have a Medical Advance Directive? No No No No No No Yes  Type of Tax inspector;Living will  Does patient want to make changes to medical advance directive?       No - Patient declined  Copy of Healthcare Power of Attorney in Chart?       Yes - validated most recent copy scanned in chart (See row information)  Would patient like information on creating a medical advance directive? No - Patient declined No - Patient declined  No - Patient declined No - Patient declined No - Patient declined     Current Medications (verified) Outpatient Encounter Medications as of 04/07/2023  Medication Sig   albuterol (VENTOLIN HFA) 108 (90 Base) MCG/ACT inhaler Inhale 2 puffs into the lungs every 6 (six) hours as needed for wheezing or shortness of breath.   atorvastatin (LIPITOR) 40  MG tablet Take 1 tablet (40 mg total) by mouth daily.   HYDROcodone-acetaminophen (NORCO/VICODIN) 5-325 MG tablet Take 1 tablet by mouth every 6 (six) hours as needed.   PARoxetine (PAXIL) 20 MG tablet Take 1 tablet (20 mg total) by mouth every morning.   spironolactone (ALDACTONE) 50 MG tablet Take 1 tablet (50 mg total) by mouth daily.   telmisartan-hydrochlorothiazide (MICARDIS HCT) 80-25 MG tablet Take 1 tablet by mouth daily.   Tiotropium Bromide Monohydrate (SPIRIVA RESPIMAT) 2.5 MCG/ACT AERS INHALE 2 PUFFS INTO THE LUNGS ONCE DAILY   albuterol (PROVENTIL) (2.5 MG/3ML) 0.083% nebulizer solution Take 3 mLs (2.5 mg total) by nebulization every 8 (eight) hours as needed for wheezing or shortness of breath. (Patient not taking: Reported on 04/07/2023)   amitriptyline (ELAVIL) 25 MG tablet Take 25 mg by mouth at bedtime. (Patient not taking: Reported on 04/07/2023)   aspirin EC 81 MG tablet Take 1 tablet (81 mg total) by mouth daily. (Patient not taking: Reported on 04/07/2023)   metoprolol succinate (TOPROL-XL) 25 MG 24 hr tablet TAKE 1 TABLET BY MOUTH EVERY DAY WITH OR IMMEDIATELY FOLLOWING A MEAL (Patient not taking: Reported on 04/07/2023)   No facility-administered encounter medications on file as of 04/07/2023.    Allergies (verified) Bee pollen, Bee venom, and Tomato   History: Past Medical History:  Diagnosis Date   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ervical  region    Chronic kidney disease    renal stent   COPD (chronic obstructive pulmonary disease) (HCC)    History of nephrolithiasis    HOH (hard of hearing)    Hyperlipidemia    Hypertension    Interstitial cystitis    Meniere disease    Peripheral arterial disease (HCC) Feb 2011   by renal artery duplex   Tobacco abuse    one pack daily   Past Surgical History:  Procedure Laterality Date   ABDOMINAL HYSTERECTOMY     arthroscopy     left knee , remote   BREAST BIOPSY Right 10/23/2019   stereo biopsy  calcs/coil clip/ path pending   BREAST EXCISIONAL BIOPSY Left 1980's   NEG   BREAST SURGERY     left breast cyst, benign   CARPAL TUNNEL RELEASE     Dr. Hyacinth Meeker,  right hand    CATARACT EXTRACTION W/PHACO Right 03/05/2015   Procedure: CATARACT EXTRACTION PHACO AND INTRAOCULAR LENS PLACEMENT (IOC);  Surgeon: Lia Hopping, MD;  Location: [facility] ORS;  Service: Ophthalmology;  Laterality: Right;  [place]: 00:52.3 AP%: 15.2 CDE: 7.96   COCHLEAR IMPLANT Left 01/2023   KNEE ARTHROSCOPY Left    TOTAL ABDOMINAL HYSTERECTOMY W/ BILATERAL SALPINGOOPHORECTOMY  1992   due to uterine tumor, and endometriosis    Family History  Problem Relation Age of Onset   Coronary artery disease Mother    COPD Mother    Heart disease Mother    Breast cancer Maternal Grandmother    Multiple sclerosis Sister    Stroke Sister    Social History   Socioeconomic History   Marital status: Single    Spouse name: Not on file   Number of children: 1   Years of education: 10   Highest education level: 10th grade  Occupational History   Occupation: Disabled  Tobacco Use   Smoking status: Every Day    Current packs/day: 1.00    Types: Cigarettes   Smokeless tobacco: Never  Vaping Use   Vaping status: Never Used  Substance and Sexual Activity   Alcohol use: No    Alcohol/week: 0.0 standard drinks of alcohol   Drug use: No   Sexual activity: Not Currently  Other Topics Concern   Not on file  Social History Narrative   Not on file   Social Determinants of Health   Financial Resource Strain: Low Risk  (04/07/2023)   Overall Financial Resource Strain (CARDIA)    Difficulty of Paying Living Expenses: Not hard at all  Food Insecurity: No Food Insecurity (04/07/2023)   Hunger Vital Sign    Worried About Running Out of Food in the Last Year: Never true    Ran Out of Food in the Last Year: Never true  Transportation Needs: No Transportation Needs (04/07/2023)   PRAPARE - Administrator, Civil Service  (Medical): No    Lack of Transportation (Non-Medical): No  Physical Activity: Inactive (04/07/2023)   Exercise Vital Sign    Days of Exercise per Week: 0 days    Minutes of Exercise per Session: 0 min  Stress: No Stress Concern Present (04/07/2023)   [employer] of Occupational Health - Occupational Stress Questionnaire    Feeling of Stress : Only a little  Social Connections: Moderately Isolated (04/07/2023)   Social Connection and Isolation Panel [NHANES]    Frequency of Communication with Friends and Family: More than three times a week    Frequency of Social Gatherings with Friends  and Family: More than three times a week    Attends Religious Services: More than 4 times per year    Active Member of Clubs or Organizations: No    Attends Banker Meetings: Never    Marital Status: Divorced    Tobacco Counseling Ready to quit: Yes Counseling given: Yes   Clinical Intake:  Pre-visit preparation completed: Yes  Pain : No/denies pain     BMI - recorded: 30.51 Nutritional Status: BMI > 30  Obese Nutritional Risks: None Diabetes: Yes CBG done?: No Did pt. bring in CBG monitor from home?: No  How often do you need to have someone help you when you read instructions, pamphlets, or other written materials from your doctor or pharmacy?: 1 - Never  Interpreter Needed?: No  Information entered by :: R. Annalena Piatt LPN   Activities of Daily Living    04/07/2023    8:59 AM  In your present state of health, do you have any difficulty performing the following activities:  Hearing? 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 0  Comment readers  Difficulty concentrating or making decisions? 0  Walking or climbing stairs? 0  Dressing or bathing? 0  Doing errands, shopping? 0  Preparing Food and eating ? N  Using the Toilet? N  In the past six months, have you accidently leaked urine? Y  Comment wears pad  Do you have problems with loss of bowel control? N  Managing your  Medications? N  Managing your Finances? N  Housekeeping or managing your Housekeeping? N    Patient Care Team: Sherlene Shams, MD as PCP - General (Internal Medicine) Debbe Odea, MD as PCP - Cardiology (Cardiology)  Indicate any recent Medical Services you may have received from other than [facility] providers in the past year (date may be approximate).     Assessment:   This is a routine wellness examination for [place].  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 Vision Screening - Comments:: readers  Dietary issues and exercise activities discussed:     Goals Addressed               This Visit's Progress     wants to get her teeth fixed (pt-stated)        Wants to quit smoking and go to the eye doctor       Depression Screen    04/07/2023    9:21 AM 11/21/2022   10:52 AM 05/02/2022    9:50 AM 04/05/2022    8:50 AM 01/27/2022    4:45 PM 01/13/2022   12:38 PM 07/28/2021    9:16 AM  PHQ 2/9 Scores  PHQ - 2 Score 0 0 0 0 0 0 0  PHQ- 9 Score 3 0         Fall Risk    04/07/2023    9:03 AM 11/21/2022   10:52 AM 05/02/2022    9:49 AM 04/05/2022    8:52 AM 01/27/2022    4:45 PM  Fall Risk   Falls in the past year? 1 1 1 1 1   Number falls in past yr: 1 1 0  0  Injury with Fall? 0 0 0  0  Risk for fall due to : History of fall(s);Impaired balance/gait  History of fall(s)  History of fall(s)  Risk for fall due to: Comment vertigo      Follow up Falls evaluation completed;Falls prevention discussed  Falls evaluation completed Falls  evaluation completed Falls evaluation completed    MEDICARE RISK AT HOME:  Medicare Risk at Home - 04/07/23 0904     Any stairs in or around the home? Yes    If so, are there any without handrails? Yes    Home free of loose throw rugs in walkways, pet beds, electrical cords, etc? Yes    Adequate lighting in your home to reduce risk of falls? Yes    Life alert? No    Use of a cane, walker or w/c? No     Grab bars in the bathroom? No    Shower chair or bench in shower? No    Elevated toilet seat or a handicapped toilet? No                 Cognitive Function:    12/16/2016   10:36 AM  MMSE - Mini Mental State Exam  Orientation to time 5  Orientation to Place 5  Registration 3  Attention/ Calculation 5  Recall 3  Language- name 2 objects 2  Language- repeat 1  Language- follow 3 step command 3  Language- read & follow direction 1  Write a sentence 1  Copy design 1  Total score 30        04/07/2023    9:29 AM 01/17/2020   10:04 AM 01/16/2019   11:23 AM 01/09/2018   11:01 AM  6CIT Screen  What Year?  0 points 0 points 0 points  What month?  0 points 0 points 0 points  What time? 0 points  0 points 0 points  Count back from 20 0 points 0 points 0 points 0 points  Months in reverse 0 points 2 points 0 points 0 points  Repeat phrase 0 points 2 points 0 points 0 points  Total Score   0 points 0 points    Immunizations Immunization History  Administered Date(s) Administered   Fluad Quad(high Dose 65+) 10/16/2020   Influenza Split 07/25/2011, 09/27/2012   Influenza,inj,Quad PF,6+ Mos 05/29/2013, 08/14/2014, 11/06/2015, 09/19/2016, 07/12/2018   PFIZER(Purple Top)SARS-COV-2 Vaccination 11/29/2019, 12/20/2019   PNEUMOCOCCAL CONJUGATE-20 01/28/2022   Pneumococcal Conjugate-13 05/29/2013   Pneumococcal Polysaccharide-23 08/14/2014   Tdap 07/25/2011   Zoster, Live 08/20/2014    TDAP status: Due, Education has been provided regarding the importance of this vaccine. Advised may receive this vaccine at local pharmacy or Health Dept. Aware to provide a copy of the vaccination record if obtained from local pharmacy or Health Dept. Verbalized acceptance and understanding.  Flu Vaccine status: Due, Education has been provided regarding the importance of this vaccine. Advised may receive this vaccine at local pharmacy or Health Dept. Aware to provide a copy of the vaccination record  if obtained from local pharmacy or Health Dept. Verbalized acceptance and understanding.  Pneumococcal vaccine status: Up to date  Covid-19 vaccine status: Completed vaccines  Qualifies for Shingles Vaccine? Yes   Zostavax completed Yes   Shingrix Completed?: No.    Education has been provided regarding the importance of this vaccine. Patient has been advised to call insurance company to determine out of pocket expense if they have not yet received this vaccine. Advised may also receive vaccine at local pharmacy or Health Dept. Verbalized acceptance and understanding.  Screening Tests Health Maintenance  Topic Date Due   Zoster Vaccines- Shingrix (1 of 2) [birth year]/1974   Lung Cancer Screening  Never done   OPHTHALMOLOGY EXAM  10/30/2019   DEXA SCAN  Never done   MAMMOGRAM  04/05/2020   DTaP/Tdap/Td (2 - Td or Tdap) 07/24/2021   Colonoscopy  03/19/2022   Medicare Annual Wellness (AWV)  04/06/2023   INFLUENZA VACCINE  11/20/2023 (Originally 03/23/2023)   HEMOGLOBIN A1C  09/07/2023   Diabetic kidney evaluation - Urine ACR  03/06/2024   FOOT EXAM  03/06/2024   Diabetic kidney evaluation - eGFR measurement  03/13/2024   Pneumonia Vaccine 69+ Years old  Completed   Hepatitis C Screening  Completed   HPV VACCINES  Aged Out   COVID-19 Vaccine  Discontinued    Health Maintenance  Health Maintenance Due  Topic Date Due   Zoster Vaccines- Shingrix (1 of 2) [birth year]02[birth year]1974   Lung Cancer Screening  Never done   OPHTHALMOLOGY EXAM  10/30/2019   DEXA SCAN  Never done   MAMMOGRAM  04/05/2020   DTaP/Tdap/Td (2 - Td or Tdap) 07/24/2021   Colonoscopy  03/19/2022   Medicare Annual Wellness (AWV)  04/06/2023    Colorectal cancer screening: Type of screening: Colonoscopy. Completed 7/13. Repeat every 10 years Declines at this  Mammogram status: Completed 2/21. Repeat every year  Bone Density status: Ordered today. Pt provided with contact info and advised to call to schedule appt.  Lung Cancer  Screening: (Low Dose CT Chest recommended if Age 19-80 years, 20 pack-year currently smoking OR have quit w/in 15years.) does not qualify.   Lung Cancer Screening Referral: Will discuss with PCP at next visit  Additional Screening:  Hepatitis C Screening: does qualify; Completed 6/20  Vision Screening: Recommended annual ophthalmology exams for early detection of glaucoma and other disorders of the eye. Is the patient up to date with their annual eye exam?  No  Who is the provider or what is the name of the office in which the patient attends annual eye exams? Needs to call insurance company to see who is covered under her plan If pt is not established with a provider, would they like to be referred to a provider to establish care? No .   Dental Screening: Recommended annual dental exams for proper oral hygiene  Diabetic Foot Exam: Diabetic Foot Exam: Overdue, Pt has been advised about the importance in completing this exam. Pt is scheduled for diabetic foot exam on Needs documented at next visit.  Community Resource Referral / Chronic Care Management: CRR required this visit?  No   CCM required this visit?  No     Plan:     I have personally reviewed and noted the following in the patient's chart:   Medical and social history Use of alcohol, tobacco or illicit drugs  Current medications and supplements including opioid prescriptions. Patient is currently taking opioid prescriptions. Information provided to patient regarding non-opioid alternatives. Patient advised to discuss non-opioid treatment plan with their provider. Functional ability and status Nutritional status Physical activity Advanced directives List of other physicians Hospitalizations, surgeries, and ER visits in previous 12 months Vitals Screenings to include cognitive, depression, and falls Referrals and appointments  In addition, I have reviewed and discussed with patient certain preventive protocols,  quality metrics, and best practice recommendations. A written personalized care plan for preventive services as well as general preventive health recommendations were provided to patient.     Sydell Axon, LPN   [id number]   After Visit Summary: (MyChart) Due to this being a telephonic visit, the after visit summary with patients personalized plan was offered to patient via MyChart   Nurse Notes: None

## 2023-04-07 NOTE — Patient Instructions (Signed)
Patricia Fields , Thank you for taking time to come for your Medicare Wellness Visit. I appreciate your ongoing commitment to your health goals. Please review the following plan we discussed and let me know if I can assist you in the future.   Referrals/Orders/Follow-Ups/Clinician Recommendations: Mammogram and Bone Density/Dexa  This is a list of the screening recommended for you and due dates:  Health Maintenance  Topic Date Due   Zoster (Shingles) Vaccine (1 of 2) [birth year]02/1974   Screening for Lung Cancer  Never done   Eye exam for diabetics  10/30/2019   DEXA scan (bone density measurement)  Never done   Mammogram  04/05/2020   DTaP/Tdap/Td vaccine (2 - Td or Tdap) 07/24/2021   Colon Cancer Screening  03/19/2022   Flu Shot  11/20/2023*   Hemoglobin A1C  09/07/2023   Yearly kidney health urinalysis for diabetes  03/06/2024   Complete foot exam   03/06/2024   Yearly kidney function blood test for diabetes  03/13/2024   Medicare Annual Wellness Visit  04/06/2024   Pneumonia Vaccine  Completed   Hepatitis C Screening  Completed   HPV Vaccine  Aged Out   COVID-19 Vaccine  Discontinued  *Topic was postponed. The date shown is not the original due date.    Advanced directives: (Declined) Advance directive discussed with you today. Even though you declined this today, please call our office should you change your mind, and we can give you the proper paperwork for you to fill out.  Next Medicare Annual Wellness Visit scheduled for next year: Yes 04/09/24 @ 9:45 Managing Pain Without Opioids Opioids are strong medicines used to treat moderate to severe pain. For some people, especially those who have long-term (chronic) pain, opioids may not be the best choice for pain management due to: Side effects like nausea, constipation, and sleepiness. The risk of addiction (opioid use disorder). The longer you take opioids, the greater your risk of addiction. Pain that lasts for more than 3 months is  called chronic pain. Managing chronic pain usually requires more than one approach and is often provided by a team of health care providers working together (multidisciplinary approach). Pain management may be done at a pain management center or pain clinic. How to manage pain without the use of opioids Use non-opioid medicines Non-opioid medicines for pain may include: Over-the-counter or prescription non-steroidal anti-inflammatory drugs (NSAIDs). These may be the first medicines used for pain. They work well for muscle and bone pain, and they reduce swelling. Acetaminophen. This over-the-counter medicine may work well for milder pain but not swelling. Antidepressants. These may be used to treat chronic pain. A certain type of antidepressant (tricyclics) is often used. These medicines are given in lower doses for pain than when used for depression. Anticonvulsants. These are usually used to treat seizures but may also reduce nerve (neuropathic) pain. Muscle relaxants. These relieve pain caused by sudden muscle tightening (spasms). You may also use a pain medicine that is applied to the skin as a patch, cream, or gel (topical analgesic), such as a numbing medicine. These may cause fewer side effects than medicines taken by mouth. Do certain therapies as directed Some therapies can help with pain management. They include: Physical therapy. You will do exercises to gain strength and flexibility. A physical therapist may teach you exercises to move and stretch parts of your body that are weak, stiff, or painful. You can learn these exercises at physical therapy visits and practice them at home. Physical therapy  may also involve: Massage. Heat wraps or applying heat or cold to affected areas. Electrical signals that interrupt pain signals (transcutaneous electrical nerve stimulation, TENS). Weak lasers that reduce pain and swelling (low-level laser therapy). Signals from your body that help you learn  to regulate pain (biofeedback). Occupational therapy. This helps you to learn ways to function at home and work with less pain. Recreational therapy. This involves trying new activities or hobbies, such as a physical activity or drawing. Mental health therapy, including: Cognitive behavioral therapy (CBT). This helps you learn coping skills for dealing with pain. Acceptance and commitment therapy (ACT) to change the way you think and react to pain. Relaxation therapies, including muscle relaxation exercises and mindfulness-based stress reduction. Pain management counseling. This may be individual, family, or group counseling.  Receive medical treatments Medical treatments for pain management include: Nerve block injections. These may include a pain blocker and anti-inflammatory medicines. You may have injections: Near the spine to relieve chronic back or neck pain. Into joints to relieve back or joint pain. Into nerve areas that supply a painful area to relieve body pain. Into muscles (trigger point injections) to relieve some painful muscle conditions. A medical device placed near your spine to help block pain signals and relieve nerve pain or chronic back pain (spinal cord stimulation device). Acupuncture. Follow these instructions at home Medicines Take over-the-counter and prescription medicines only as told by your health care provider. If you are taking pain medicine, ask your health care providers about possible side effects to watch out for. Do not drive or use heavy machinery while taking prescription opioid pain medicine. Lifestyle  Do not use drugs or alcohol to reduce pain. If you drink alcohol, limit how much you have to: 0-1 drink a day for women who are not pregnant. 0-2 drinks a day for men. Know how much alcohol is in a drink. In the [place] one drink equals one 12 oz bottle of beer (355 mL), one 5 oz glass of wine (148 mL), or one 1 oz glass of hard liquor (44 mL). Do  not use any products that contain nicotine or tobacco. These products include cigarettes, chewing tobacco, and vaping devices, such as e-cigarettes. If you need help quitting, ask your health care provider. Eat a healthy diet and maintain a healthy weight. Poor diet and excess weight may make pain worse. Eat foods that are high in fiber. These include fresh fruits and vegetables, whole grains, and beans. Limit foods that are high in fat and processed sugars, such as fried and sweet foods. Exercise regularly. Exercise lowers stress and may help relieve pain. Ask your health care provider what activities and exercises are safe for you. If your health care provider approves, join an exercise class that combines movement and stress reduction. Examples include yoga and tai chi. Get enough sleep. Lack of sleep may make pain worse. Lower stress as much as possible. Practice stress reduction techniques as told by your therapist. General instructions Work with all your pain management providers to find the treatments that work best for you. You are an important member of your pain management team. There are many things you can do to reduce pain on your own. Consider joining an online or in-person support group for people who have chronic pain. Keep all follow-up visits. This is important. Where to find more information You can find more information about managing pain without opioids from: American Academy of Pain Medicine: painmed.org Institute for Chronic Pain: instituteforchronicpain.org American Chronic  Pain Association: theacpa.org Contact a health care provider if: You have side effects from pain medicine. Your pain gets worse or does not get better with treatments or home therapy. You are struggling with anxiety or depression. Summary Many types of pain can be managed without opioids. Chronic pain may respond better to pain management without opioids. Pain is best managed when you and a team of  health care providers work together. Pain management without opioids may include non-opioid medicines, medical treatments, physical therapy, mental health therapy, and lifestyle changes. Tell your health care providers if your pain gets worse or is not being managed well enough. This information is not intended to replace advice given to you by your health care provider. Make sure you discuss any questions you have with your health care provider. Document Revised: 11/18/2020 Document Reviewed: 11/18/2020 Elsevier Patient Education  2024 [employer].   Preventive Care 65 Years and Older, Female Preventive care refers to lifestyle choices and visits with your health care provider that can promote health and wellness. What does preventive care include? A yearly physical exam. This is also called an annual well check. Dental exams once or twice a year. Routine eye exams. Ask your health care provider how often you should have your eyes checked. Personal lifestyle choices, including: Daily care of your teeth and gums. Regular physical activity. Eating a healthy diet. Avoiding tobacco and drug use. Limiting alcohol use. Practicing safe sex. Taking low-dose aspirin every day. Taking vitamin and mineral supplements as recommended by your health care provider. What happens during an annual well check? The services and screenings done by your health care provider during your annual well check will depend on your age, overall health, lifestyle risk factors, and family history of disease. Counseling  Your health care provider may ask you questions about your: Alcohol use. Tobacco use. Drug use. Emotional well-being. Home and relationship well-being. Sexual activity. Eating habits. History of falls. Memory and ability to understand (cognition). Work and work Astronomer. Reproductive health. Screening  You may have the following tests or measurements: Height, weight, and BMI. Blood  pressure. Lipid and cholesterol levels. These may be checked every 5 years, or more frequently if you are over 11 years old. Skin check. Lung cancer screening. You may have this screening every year starting at age 97 if you have a 30-pack-year history of smoking and currently smoke or have quit within the past 15 years. Fecal occult blood test (FOBT) of the stool. You may have this test every year starting at age 20. Flexible sigmoidoscopy or colonoscopy. You may have a sigmoidoscopy every 5 years or a colonoscopy every 10 years starting at age 22. Hepatitis C blood test. Hepatitis B blood test. Sexually transmitted disease (STD) testing. Diabetes screening. This is done by checking your blood sugar (glucose) after you have not eaten for a while (fasting). You may have this done every 1-3 years. Bone density scan. This is done to screen for osteoporosis. You may have this done starting at age 44. Mammogram. This may be done every 1-2 years. Talk to your health care provider about how often you should have regular mammograms. Talk with your health care provider about your test results, treatment options, and if necessary, the need for more tests. Vaccines  Your health care provider may recommend certain vaccines, such as: Influenza vaccine. This is recommended every year. Tetanus, diphtheria, and acellular pertussis (Tdap, Td) vaccine. You may need a Td booster every 10 years. Zoster vaccine. You may  need this after age 87. Pneumococcal 13-valent conjugate (PCV13) vaccine. One dose is recommended after age 80. Pneumococcal polysaccharide (PPSV23) vaccine. One dose is recommended after age 59. Talk to your health care provider about which screenings and vaccines you need and how often you need them. This information is not intended to replace advice given to you by your health care provider. Make sure you discuss any questions you have with your health care provider. Document Released:  09/04/2015 Document Revised: 04/27/2016 Document Reviewed: 06/09/2015 Elsevier Interactive Patient Education  2017 [employer].  Fall Prevention in the Home Falls can cause injuries. They can happen to people of all ages. There are many things you can do to make your home safe and to help prevent falls. What can I do on the outside of my home? Regularly fix the edges of walkways and driveways and fix any cracks. Remove anything that might make you trip as you walk through a door, such as a raised step or threshold. Trim any bushes or trees on the path to your home. Use bright outdoor lighting. Clear any walking paths of anything that might make someone trip, such as rocks or tools. Regularly check to see if handrails are loose or broken. Make sure that both sides of any steps have handrails. Any raised decks and porches should have guardrails on the edges. Have any leaves, snow, or ice cleared regularly. Use sand or salt on walking paths during winter. Clean up any spills in your garage right away. This includes oil or grease spills. What can I do in the bathroom? Use night lights. Install grab bars by the toilet and in the tub and shower. Do not use towel bars as grab bars. Use non-skid mats or decals in the tub or shower. If you need to sit down in the shower, use a plastic, non-slip stool. Keep the floor dry. Clean up any water that spills on the floor as soon as it happens. Remove soap buildup in the tub or shower regularly. Attach bath mats securely with double-sided non-slip rug tape. Do not have throw rugs and other things on the floor that can make you trip. What can I do in the bedroom? Use night lights. Make sure that you have a light by your bed that is easy to reach. Do not use any sheets or blankets that are too big for your bed. They should not hang down onto the floor. Have a firm chair that has side arms. You can use this for support while you get dressed. Do not have  throw rugs and other things on the floor that can make you trip. What can I do in the kitchen? Clean up any spills right away. Avoid walking on wet floors. Keep items that you use a lot in easy-to-reach places. If you need to reach something above you, use a strong step stool that has a grab bar. Keep electrical cords out of the way. Do not use floor polish or wax that makes floors slippery. If you must use wax, use non-skid floor wax. Do not have throw rugs and other things on the floor that can make you trip. What can I do with my stairs? Do not leave any items on the stairs. Make sure that there are handrails on both sides of the stairs and use them. Fix handrails that are broken or loose. Make sure that handrails are as long as the stairways. Check any carpeting to make sure that it is firmly attached  to the stairs. Fix any carpet that is loose or worn. Avoid having throw rugs at the top or bottom of the stairs. If you do have throw rugs, attach them to the floor with carpet tape. Make sure that you have a light switch at the top of the stairs and the bottom of the stairs. If you do not have them, ask someone to add them for you. What else can I do to help prevent falls? Wear shoes that: Do not have high heels. Have rubber bottoms. Are comfortable and fit you well. Are closed at the toe. Do not wear sandals. If you use a stepladder: Make sure that it is fully opened. Do not climb a closed stepladder. Make sure that both sides of the stepladder are locked into place. Ask someone to hold it for you, if possible. Clearly mark and make sure that you can see: Any grab bars or handrails. First and last steps. Where the edge of each step is. Use tools that help you move around (mobility aids) if they are needed. These include: Canes. Walkers. Scooters. Crutches. Turn on the lights when you go into a dark area. Replace any light bulbs as soon as they burn out. Set up your furniture so  you have a clear path. Avoid moving your furniture around. If any of your floors are uneven, fix them. If there are any pets around you, be aware of where they are. Review your medicines with your doctor. Some medicines can make you feel dizzy. This can increase your chance of falling. Ask your doctor what other things that you can do to help prevent falls. This information is not intended to replace advice given to you by your health care provider. Make sure you discuss any questions you have with your health care provider. Document Released: 06/04/2009 Document Revised: 01/14/2016 Document Reviewed: 09/12/2014 Elsevier Interactive Patient Education  2017 [employer].

## 2023-04-10 ENCOUNTER — Ambulatory Visit: Payer: Medicare HMO

## 2023-04-12 ENCOUNTER — Telehealth: Payer: Self-pay

## 2023-04-12 MED ORDER — UMECLIDINIUM BROMIDE 62.5 MCG/ACT IN AEPB: 1.0000 | INHALATION_SPRAY | Freq: Every day | RESPIRATORY_TRACT | 11 refills | Status: DC

## 2023-04-12 NOTE — Telephone Encounter (Signed)
Received a fax from [employer] stating the the Spriva Respimat in is not covered by insurance. They are requesting an alternative and the preferred is Incruse Elipta.

## 2023-04-12 NOTE — Addendum Note (Signed)
Addended by: Sherlene Shams on: 04/12/2023 05:09 PM   Modules accepted: Orders

## 2023-04-12 NOTE — Telephone Encounter (Signed)
ALTERNATIVE HAS BEEN SENT

## 2023-04-13 NOTE — Telephone Encounter (Signed)
noted 

## 2023-04-28 DIAGNOSIS — Z45321 Encounter for adjustment and management of cochlear device: Secondary | ICD-10-CM | POA: Diagnosis not present

## 2023-04-28 DIAGNOSIS — H903 Sensorineural hearing loss, bilateral: Secondary | ICD-10-CM | POA: Diagnosis not present

## 2023-05-03 ENCOUNTER — Ambulatory Visit
Admission: RE | Admit: 2023-05-03 | Discharge: 2023-05-03 | Disposition: A | Discharge status: Home / Self Care | Payer: Medicare HMO | Source: Ambulatory Visit | Attending: Internal Medicine | Admitting: Internal Medicine

## 2023-05-03 ENCOUNTER — Other Ambulatory Visit: Payer: Self-pay

## 2023-05-03 DIAGNOSIS — Z1231 Encounter for screening mammogram for malignant neoplasm of breast: Secondary | ICD-10-CM | POA: Insufficient documentation

## 2023-05-03 MED ORDER — PAROXETINE HCL 20 MG PO TABS: 20.0000 mg | ORAL_TABLET | Freq: Every morning | ORAL | 1 refills | Status: DC

## 2023-06-21 DIAGNOSIS — Z8249 Family history of ischemic heart disease and other diseases of the circulatory system: Secondary | ICD-10-CM | POA: Diagnosis not present

## 2023-06-21 DIAGNOSIS — J449 Chronic obstructive pulmonary disease, unspecified: Secondary | ICD-10-CM | POA: Diagnosis not present

## 2023-06-21 DIAGNOSIS — Z6830 Body mass index (BMI) 30.0-30.9, adult: Secondary | ICD-10-CM | POA: Diagnosis not present

## 2023-06-21 DIAGNOSIS — Z823 Family history of stroke: Secondary | ICD-10-CM | POA: Diagnosis not present

## 2023-06-21 DIAGNOSIS — F17210 Nicotine dependence, cigarettes, uncomplicated: Secondary | ICD-10-CM | POA: Diagnosis not present

## 2023-06-21 DIAGNOSIS — F411 Generalized anxiety disorder: Secondary | ICD-10-CM | POA: Diagnosis not present

## 2023-06-21 DIAGNOSIS — R32 Unspecified urinary incontinence: Secondary | ICD-10-CM | POA: Diagnosis not present

## 2023-06-21 DIAGNOSIS — I739 Peripheral vascular disease, unspecified: Secondary | ICD-10-CM | POA: Diagnosis not present

## 2023-06-21 DIAGNOSIS — E785 Hyperlipidemia, unspecified: Secondary | ICD-10-CM | POA: Diagnosis not present

## 2023-06-21 DIAGNOSIS — N189 Chronic kidney disease, unspecified: Secondary | ICD-10-CM | POA: Diagnosis not present

## 2023-06-21 DIAGNOSIS — E669 Obesity, unspecified: Secondary | ICD-10-CM | POA: Diagnosis not present

## 2023-06-21 DIAGNOSIS — F325 Major depressive disorder, single episode, in full remission: Secondary | ICD-10-CM | POA: Diagnosis not present

## 2023-06-22 ENCOUNTER — Ambulatory Visit (INDEPENDENT_AMBULATORY_CARE_PROVIDER_SITE_OTHER): Payer: Medicare HMO | Admitting: Internal Medicine

## 2023-06-22 ENCOUNTER — Encounter: Payer: Self-pay | Admitting: Internal Medicine

## 2023-06-22 VITALS — BP 120/70 | HR 84 | Temp 97.5°F | Ht <= 58 in | Wt 142.8 lb

## 2023-06-22 DIAGNOSIS — Z1211 Encounter for screening for malignant neoplasm of colon: Secondary | ICD-10-CM

## 2023-06-22 DIAGNOSIS — Z0000 Encounter for general adult medical examination without abnormal findings: Secondary | ICD-10-CM | POA: Diagnosis not present

## 2023-06-22 DIAGNOSIS — E1121 Type 2 diabetes mellitus with diabetic nephropathy: Secondary | ICD-10-CM

## 2023-06-22 DIAGNOSIS — R413 Other amnesia: Secondary | ICD-10-CM | POA: Diagnosis not present

## 2023-06-22 DIAGNOSIS — E538 Deficiency of other specified B group vitamins: Secondary | ICD-10-CM | POA: Diagnosis not present

## 2023-06-22 DIAGNOSIS — E1151 Type 2 diabetes mellitus with diabetic peripheral angiopathy without gangrene: Secondary | ICD-10-CM

## 2023-06-22 DIAGNOSIS — I150 Renovascular hypertension: Secondary | ICD-10-CM

## 2023-06-22 DIAGNOSIS — E1169 Type 2 diabetes mellitus with other specified complication: Secondary | ICD-10-CM

## 2023-06-22 DIAGNOSIS — E785 Hyperlipidemia, unspecified: Secondary | ICD-10-CM | POA: Diagnosis not present

## 2023-06-22 DIAGNOSIS — Z23 Encounter for immunization: Secondary | ICD-10-CM

## 2023-06-22 DIAGNOSIS — Z122 Encounter for screening for malignant neoplasm of respiratory organs: Secondary | ICD-10-CM

## 2023-06-22 LAB — LIPID PANEL
Cholesterol: 149 mg/dL (ref 0–200)
HDL: 41 mg/dL (ref >39.00)
LDL Cholesterol: 80 mg/dL (ref 0–99)
NonHDL: 108.12
Total CHOL/HDL Ratio: 4
Triglycerides: 141 mg/dL (ref 0.0–149.0)
VLDL: 28.2 mg/dL (ref 0.0–40.0)

## 2023-06-22 LAB — COMPREHENSIVE METABOLIC PANEL
ALT: 12 U/L (ref 0–35)
AST: 13 U/L (ref 0–37)
Albumin: 4.2 g/dL (ref 3.5–5.2)
Alkaline Phosphatase: 105 U/L (ref 39–117)
BUN: 26 mg/dL — ABNORMAL HIGH (ref 6–23)
CO2: 26 meq/L (ref 19–32)
Calcium: 9.9 mg/dL (ref 8.4–10.5)
Chloride: 106 meq/L (ref 96–112)
Creatinine, Ser: 1.32 mg/dL — ABNORMAL HIGH (ref 0.40–1.20)
GFR: 41.57 mL/min — ABNORMAL LOW (ref >60.00)
Glucose, Bld: 109 mg/dL — ABNORMAL HIGH (ref 70–99)
Potassium: 4.1 meq/L (ref 3.5–5.1)
Sodium: 139 meq/L (ref 135–145)
Total Bilirubin: 0.4 mg/dL (ref 0.2–1.2)
Total Protein: 7.2 g/dL (ref 6.0–8.3)

## 2023-06-22 LAB — B12 AND FOLATE PANEL
Folate: 7.4 ng/mL (ref >5.9)
Vitamin B-12: 238 pg/mL (ref 211–911)

## 2023-06-22 LAB — HEMOGLOBIN A1C: Hgb A1c MFr Bld: 6.6 % — ABNORMAL HIGH (ref 4.6–6.5)

## 2023-06-22 LAB — LDL CHOLESTEROL, DIRECT: Direct LDL: 81 mg/dL

## 2023-06-22 NOTE — Patient Instructions (Addendum)
Your sleep issues may be due to too much brain stimulation.  Turn off your phone 2 hours before bedtime ( too stimulating for the brain )   You can ry using Relaxium for insomnia  (as seen on TV commercials) . It is available through [employer] and contains all natural supplements:  Melatonin 5 mg  Chamomile 25 mg Passionflower extract 75 mg GABA 100 mg Ashwaganda extract 125 mg Magnesium citrate, glycinate, oxide (100 mg)  L tryptophan 500 mg Valerest (proprietary  ingredient ; probably valeria root extract)    Prevagen is a supplement for brain health,  also available OTC   Cologuard has been reordered for colon CA screening you are OVERUDE   Lung cancer screening has been initiated with referral to [facility] at [facility]   Resume your baby aspirin

## 2023-06-24 NOTE — Assessment & Plan Note (Signed)
She has reduced proteinuria with use of telmisartan  o 40 mg daily given eleavated readings. She is  s/p renal artery stenting   Lab Results  Component Value Date   MICROALBUR 3.9 (H) 03/07/2023   MICROALBUR 2.2 01/28/2022

## 2023-06-24 NOTE — Assessment & Plan Note (Signed)
Continue atorvastatin,  resume aspirin

## 2023-06-24 NOTE — Assessment & Plan Note (Signed)
she reports compliance with medication regimen (metoprolol 25 mg , spironolactone 25 mg,  telmisartan /hct  80/25  , and  maxzide (resumed).   \.  She is not using NSAIDs daily.    Lab Results  Component Value Date   CREATININE 1.32 (H) 06/22/2023   Lab Results  Component Value Date   NA 139 06/22/2023   K 4.1 06/22/2023   CL 106 06/22/2023   CO2 26 06/22/2023

## 2023-06-25 NOTE — Addendum Note (Signed)
Addended by: Sherlene Shams on: 06/25/2023 09:34 PM   Modules accepted: Orders

## 2023-06-27 ENCOUNTER — Ambulatory Visit (INDEPENDENT_AMBULATORY_CARE_PROVIDER_SITE_OTHER): Payer: Medicare HMO

## 2023-06-27 DIAGNOSIS — E538 Deficiency of other specified B group vitamins: Secondary | ICD-10-CM

## 2023-06-27 MED ORDER — CYANOCOBALAMIN 1000 MCG/ML IJ SOLN
1000.0000 ug | Freq: Once | INTRAMUSCULAR | Status: AC
  Administered 2023-06-27: 1000 ug via INTRAMUSCULAR

## 2023-06-27 NOTE — Progress Notes (Signed)
Teaching given on b12 injections and the patient expresses understanding and acceptance of instructions. Pt stated she would like to do rest of her injections at home.   After obtaining consent, and per orders of Dr. Darrick Huntsman, injection of B12 was given by Kristie Cowman in right deltoid. Pt tolerated injection well

## 2023-06-28 ENCOUNTER — Telehealth: Payer: Self-pay | Admitting: Internal Medicine

## 2023-06-28 DIAGNOSIS — H903 Sensorineural hearing loss, bilateral: Secondary | ICD-10-CM | POA: Diagnosis not present

## 2023-06-28 NOTE — Telephone Encounter (Signed)
Attempted to call pt to verify that she received the message Patricia Fields left. No answer. No voicemail.

## 2023-06-28 NOTE — Telephone Encounter (Signed)
Patient called and stated that she has a ear infection. No appointments available at this office today. Patient was offered an appointment at another location. Patient stated she did not want to go to [place].Patient ask that Dr Melina Schools nurse call her.

## 2023-06-28 NOTE — Telephone Encounter (Signed)
Patient was called an lm that an appointment has been made with Arnett tomorros, 06/28/2023 to be seen for possible ear infection.

## 2023-06-29 ENCOUNTER — Encounter: Payer: Self-pay | Admitting: Family

## 2023-06-29 ENCOUNTER — Ambulatory Visit: Payer: Medicare HMO | Admitting: Family

## 2023-06-29 ENCOUNTER — Telehealth: Payer: Self-pay

## 2023-06-29 VITALS — BP 130/76 | HR 99 | Temp 98.4°F | Ht <= 58 in | Wt 140.2 lb

## 2023-06-29 DIAGNOSIS — H66002 Acute suppurative otitis media without spontaneous rupture of ear drum, left ear: Secondary | ICD-10-CM | POA: Diagnosis not present

## 2023-06-29 DIAGNOSIS — H6690 Otitis media, unspecified, unspecified ear: Secondary | ICD-10-CM | POA: Insufficient documentation

## 2023-06-29 MED ORDER — AMOXICILLIN-POT CLAVULANATE 875-125 MG PO TABS: 1.0000 | ORAL_TABLET | Freq: Two times a day (BID) | ORAL | 0 refills | Status: AC

## 2023-06-29 NOTE — Telephone Encounter (Signed)
noted 

## 2023-06-29 NOTE — Assessment & Plan Note (Addendum)
Afebrile.  Overall benign exam however tympanic membrane not entirely seen due to cerumen in left ear.  Opted to go ahead and treat for suspected otitis media with Augmentin.  CrtCl 41 ml/min Recent cochlear implant and advised patient to reach out to [facility] Dr. Manson Passey to make him aware of ear pain.  Patient will let me know how she is doing

## 2023-06-29 NOTE — Progress Notes (Signed)
Assessment & Plan:  Non-recurrent acute suppurative otitis media of left ear without spontaneous rupture of tympanic membrane Assessment & Plan: Afebrile.  Overall benign exam however tympanic membrane not entirely seen due to cerumen in left ear.  Opted to go ahead and treat for suspected otitis media with Augmentin.  CrtCl 41 ml/min Recent cochlear implant and advised patient to reach out to [facility] ENT Dr. Manson Passey to make him aware of ear pain.  Patient will let me know how she is doing   Orders: -     Amoxicillin-Pot Clavulanate; Take 1 tablet by mouth 2 (two) times daily for 7 days.  Dispense: 14 tablet; Refill: 0     Return precautions given.   Risks, benefits, and alternatives of the medications and treatment plan prescribed today were discussed, and patient expressed understanding.   Education regarding symptom management and diagnosis given to patient on AVS either electronically or printed.  No follow-ups on file.  Rennie Plowman, FNP  Subjective:    Patient ID: Patricia Fields, female    DOB: 10-15-1955, 67 y.o.   MRN: [id number]  CC: [name] is a 67 y.o. female who presents today for an acute visit.    HPI: Complains of left ear pain x one week, unchanged.   She thought post operative pain after cochlear implant.  Describes as a deep ache.   She was seen yesterday by [facility] audiologist.   No fever, drainage from ear, cough, sinus pressure.   No recent antibiotics.   She had a cup of coffee and smoke a cigarette prior to coming to appointment.  Denies palpitations, CP, dizziness.    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 Manson Passey, last seen 03/17/23.   Previously seen by Dr. Andee Poles  History of shingles in her left ear. Allergies: Bee pollen, Bee venom, and Tomato Current Outpatient Medications on File Prior to Visit  Medication Sig Dispense Refill   albuterol  (PROVENTIL) (2.5 MG/3ML) 0.083% nebulizer solution Take 3 mLs (2.5 mg total) by nebulization every 8 (eight) hours as needed for wheezing or shortness of breath. 150 mL 1   albuterol (VENTOLIN HFA) 108 (90 Base) MCG/ACT inhaler Inhale 2 puffs into the lungs every 6 (six) hours as needed for wheezing or shortness of breath. 8 g 2   amitriptyline (ELAVIL) 25 MG tablet Take 25 mg by mouth at bedtime.     aspirin EC 81 MG tablet Take 1 tablet (81 mg total) by mouth daily. 90 tablet 2   PARoxetine (PAXIL) 20 MG tablet Take 1 tablet (20 mg total) by mouth every morning. 90 tablet 1   spironolactone (ALDACTONE) 50 MG tablet Take 1 tablet (50 mg total) by mouth daily. 90 tablet 1   telmisartan-hydrochlorothiazide (MICARDIS HCT) 80-25 MG tablet Take 1 tablet by mouth daily. 90 tablet 1   triamterene-hydrochlorothiazide (DYAZIDE) 37.5-25 MG capsule Take 2 capsules by mouth daily.     umeclidinium bromide (INCRUSE ELLIPTA) 62.5 MCG/ACT AEPB Inhale 1 puff into the lungs daily. 30 each 11   No current facility-administered medications on file prior to visit.    Review of Systems  Constitutional:  Negative for chills and fever.  HENT:  Positive for ear pain and hearing loss. Negative for congestion, ear discharge and facial swelling.   Respiratory:  Negative for cough and shortness of breath.   Cardiovascular:  Negative for chest pain  and palpitations.  Gastrointestinal:  Negative for nausea and vomiting.      Objective:    BP 130/76   Pulse 99   Temp 98.4 F (36.9 C) (Oral)   Ht 4\' 10"  (1.473 m)   Wt 140 lb 3.2 oz (63.6 kg)   BMI 29.30 kg/m   BP Readings from Last 3 Encounters:  06/29/23 130/76  06/22/23 120/70  03/07/23 (!) 182/76   Wt Readings from Last 3 Encounters:  06/29/23 140 lb 3.2 oz (63.6 kg)  06/22/23 142 lb 12.8 oz (64.8 kg)  04/07/23 146 lb (66.2 kg)    Physical Exam Vitals reviewed.  Constitutional:      Appearance: She is well-developed.  HENT:     Head:  Normocephalic and atraumatic.     Right Ear: Hearing, tympanic membrane, ear canal and external ear normal. No decreased hearing noted. No drainage, swelling or tenderness. No foreign body.     Left Ear: Hearing, tympanic membrane, ear canal and external ear normal. No decreased hearing noted. No drainage, swelling or tenderness. No foreign body. Tympanic membrane is not erythematous.     Ears:     Comments: Right ear with cerumen impaction. Left ear with significant cerumen around the edges of the ear canal, no impaction.  No purulent discharge.  No tenderness of external ear.     Nose: Nose normal. No rhinorrhea.     Right Sinus: No maxillary sinus tenderness or frontal sinus tenderness.     Left Sinus: No maxillary sinus tenderness or frontal sinus tenderness.     Mouth/Throat:     Pharynx: Uvula midline. No oropharyngeal exudate or posterior oropharyngeal erythema.     Tonsils: No tonsillar abscesses.  Eyes:     Conjunctiva/sclera: Conjunctivae normal.  Cardiovascular:     Rate and Rhythm: Regular rhythm.     Pulses: Normal pulses.     Heart sounds: Normal heart sounds.  Pulmonary:     Effort: Pulmonary effort is normal.     Breath sounds: Normal breath sounds. No wheezing, rhonchi or rales.  Lymphadenopathy:     Head:     Right side of head: No submental, submandibular, tonsillar, preauricular, posterior auricular or occipital adenopathy.     Left side of head: No submental, submandibular, tonsillar, preauricular, posterior auricular or occipital adenopathy.     Cervical: No cervical adenopathy.  Skin:    General: Skin is warm and dry.  Neurological:     Mental Status: She is alert.  Psychiatric:        Speech: Speech normal.        Behavior: Behavior normal.        Thought Content: Thought content normal.

## 2023-06-29 NOTE — Telephone Encounter (Signed)
Pt is scheduled to see Claris Che, NP today.

## 2023-06-29 NOTE — Patient Instructions (Addendum)
Start augmentin for suspected ear infection.  Please notify Dr. Theora Gianotti office in regards to ear pain because I want to ensure it is not post surgical.  Ensure to take probiotics while on antibiotics and also for 2 weeks after completion. This can either be by eating yogurt daily or taking a probiotic supplement over the counter such as Culturelle.It is important to re-colonize the gut with good bacteria and also to prevent any diarrheal infections associated with antibiotic use.   Nice to see you!

## 2023-06-29 NOTE — Telephone Encounter (Signed)
Patient states at check-out that she is planning on giving herself the B12 shots at home.  Patient states we can schedule appointment for next week.  Patient states she depends on someone else for her transportation and the person bringing her will have to pick up her children from school, so the times we have available may not work.  Patient states she will call us back to schedule.

## 2023-07-01 LAB — METHYLMALONIC ACID, SERUM: Methylmalonic Acid, Quant: 272 nmol/L (ref 69–390)

## 2023-07-01 LAB — INTRINSIC FACTOR ANTIBODIES: Intrinsic Factor: NEGATIVE

## 2023-07-03 ENCOUNTER — Other Ambulatory Visit: Payer: Self-pay

## 2023-07-03 MED ORDER — SYRINGE 25G X 1" 3 ML MISC: 0 refills | Status: DC

## 2023-07-03 MED ORDER — CYANOCOBALAMIN 1000 MCG/ML IJ SOLN: 1000.0000 ug | INTRAMUSCULAR | 3 refills | Status: DC

## 2023-07-03 NOTE — Telephone Encounter (Signed)
Patient states she thought that she was going to be able to give herself B12 injections at home.  Patient states we have shown her how to give herself the injection.  Patient states she does not have the medication.  Patient states when she checked out at her last visit, we wanted her to schedule an appointment for her next B12 injection but it's not convenient for her because she relies on someone else for transportation.  Patient states she would like to verify that Dr. Duncan Dull is ok with patient giving herself the injection at home.  Patient states if so, she needs the medication.

## 2023-07-03 NOTE — Telephone Encounter (Signed)
I have pended rx if okay for pt to give her at home.

## 2023-07-14 ENCOUNTER — Other Ambulatory Visit: Payer: Self-pay

## 2023-07-14 DIAGNOSIS — F17210 Nicotine dependence, cigarettes, uncomplicated: Secondary | ICD-10-CM

## 2023-07-14 DIAGNOSIS — Z87891 Personal history of nicotine dependence: Secondary | ICD-10-CM

## 2023-07-14 DIAGNOSIS — Z122 Encounter for screening for malignant neoplasm of respiratory organs: Secondary | ICD-10-CM

## 2023-07-24 ENCOUNTER — Ambulatory Visit (INDEPENDENT_AMBULATORY_CARE_PROVIDER_SITE_OTHER): Payer: Medicare HMO | Admitting: Acute Care

## 2023-07-24 DIAGNOSIS — F17210 Nicotine dependence, cigarettes, uncomplicated: Secondary | ICD-10-CM

## 2023-07-24 NOTE — Patient Instructions (Signed)

## 2023-07-24 NOTE — Progress Notes (Addendum)
 Provider Attestation I agree with the documentation of the Shared Decision Making visit,  smoking cessation counseling if appropriate, and verification or eligibility for lung cancer screening as documented by the RN Nurse Navigator.   Lauraine PHEBE Lites, MSN, AGACNP-BC [facility] See Amion for personal pager PCCM on call pager [phone]     Virtual Visit via Telephone Note  I connected with Deshia R Luoma on 07/24/23 at  9:00 AM EST by telephone and verified that I am speaking with the correct person using two identifiers.  Location: Patient: Patricia Fields Provider: Laneta Speaks, RN   I discussed the limitations, risks, security and privacy concerns of performing an evaluation and management service by telephone and the availability of in person appointments. I also discussed with the patient that there may be a patient responsible charge related to this service. The patient expressed understanding and agreed to proceed.   Shared Decision Making Visit Lung Cancer Screening Program [phone])   Eligibility: Age 67 y.o. Pack Years Smoking History Calculation 33 (# packs/per year x # years smoked) Recent History of coughing up blood  no Unexplained weight loss? no ( >Than 15 pounds within the last 6 months ) Prior History Lung / other cancer no (Diagnosis within the last 5 years already requiring surveillance chest CT Scans). Smoking Status Current Smoker Former Smokers: Years since quit: NA  Quit Date: NA  Visit Components: Discussion included one or more decision making aids. yes Discussion included risk/benefits of screening. yes Discussion included potential follow up diagnostic testing for abnormal scans. yes Discussion included meaning and risk of over diagnosis. yes Discussion included meaning and risk of False Positives. yes Discussion included meaning of total radiation exposure. yes  Counseling Included: Importance of adherence to  annual lung cancer LDCT screening. yes Impact of comorbidities on ability to participate in the program. yes Ability and willingness to under diagnostic treatment. yes  Smoking Cessation Counseling: Current Smokers:  Discussed importance of smoking cessation. yes Information about tobacco cessation classes and interventions provided to patient. yes Patient provided with ticket for LDCT Scan. no Symptomatic Patient. no  Counseling(Intermediate counseling: > three minutes) [id number] Diagnosis Code: Tobacco Use Z72.0 Asymptomatic Patient yes  Counseling (Intermediate counseling: > three minutes counseling) [id number] Former Smokers:  Discussed the importance of maintaining cigarette abstinence. na Diagnosis Code: Personal History of Nicotine  Dependence. S12.108 Information about tobacco cessation classes and interventions provided to patient. Yes Patient provided with ticket for LDCT Scan. no Written Order for Lung Cancer Screening with LDCT placed in Epic. Yes (CT Chest Lung Cancer Screening Low Dose W/O CM) [id number] Z12.2-Screening of respiratory organs Z87.891-Personal history of nicotine  dependence   Laneta Speaks, RN

## 2023-07-25 ENCOUNTER — Ambulatory Visit: Admission: RE | Admit: 2023-07-25 | Payer: Medicare HMO | Source: Ambulatory Visit

## 2023-07-26 ENCOUNTER — Ambulatory Visit
Admission: RE | Admit: 2023-07-26 | Discharge: 2023-07-26 | Disposition: A | Discharge status: Home / Self Care | Payer: Medicare HMO | Source: Ambulatory Visit | Attending: Acute Care | Admitting: Acute Care

## 2023-07-26 DIAGNOSIS — F17210 Nicotine dependence, cigarettes, uncomplicated: Secondary | ICD-10-CM | POA: Diagnosis not present

## 2023-07-26 DIAGNOSIS — Z122 Encounter for screening for malignant neoplasm of respiratory organs: Secondary | ICD-10-CM | POA: Diagnosis not present

## 2023-07-26 DIAGNOSIS — Z87891 Personal history of nicotine dependence: Secondary | ICD-10-CM | POA: Diagnosis not present

## 2023-08-01 ENCOUNTER — Other Ambulatory Visit: Payer: Self-pay | Admitting: Internal Medicine

## 2023-08-01 ENCOUNTER — Telehealth: Payer: Self-pay | Admitting: Internal Medicine

## 2023-08-01 ENCOUNTER — Other Ambulatory Visit: Payer: Self-pay

## 2023-08-01 MED ORDER — TRIAMTERENE-HCTZ 37.5-25 MG PO CAPS: 1.0000 | ORAL_CAPSULE | Freq: Every day | ORAL | 1 refills | Status: DC

## 2023-08-01 NOTE — Telephone Encounter (Signed)
Pt is aware and gave a verbal understanding.  

## 2023-08-01 NOTE — Telephone Encounter (Signed)
Received faxed refill request from [facility] for attached medication.  Medication is listed under Historical, looks like it was restarted during her last visit with you 06/22/23.  Refill is pended for your signature.

## 2023-08-01 NOTE — Telephone Encounter (Signed)
TRIAMTERENE REFILLED ,  BUT HTE AX DOSE IS 1 CAPSULE DAILY,  NOT 2 .  RX CORRECTED

## 2023-08-10 ENCOUNTER — Other Ambulatory Visit: Payer: Self-pay | Admitting: Acute Care

## 2023-08-10 DIAGNOSIS — Z122 Encounter for screening for malignant neoplasm of respiratory organs: Secondary | ICD-10-CM

## 2023-08-10 DIAGNOSIS — Z87891 Personal history of nicotine dependence: Secondary | ICD-10-CM

## 2023-08-10 DIAGNOSIS — F17210 Nicotine dependence, cigarettes, uncomplicated: Secondary | ICD-10-CM

## 2023-09-01 ENCOUNTER — Other Ambulatory Visit: Payer: Self-pay | Admitting: Internal Medicine

## 2023-09-02 ENCOUNTER — Other Ambulatory Visit: Payer: Self-pay | Admitting: Internal Medicine

## 2023-09-02 DIAGNOSIS — I10 Essential (primary) hypertension: Secondary | ICD-10-CM

## 2023-09-05 ENCOUNTER — Other Ambulatory Visit: Payer: Self-pay | Admitting: Internal Medicine

## 2023-09-12 ENCOUNTER — Other Ambulatory Visit: Payer: Self-pay

## 2023-09-12 MED ORDER — ATORVASTATIN CALCIUM 40 MG PO TABS: 40.0000 mg | ORAL_TABLET | Freq: Every day | ORAL | 1 refills | Status: DC

## 2023-09-12 NOTE — Telephone Encounter (Signed)
Medication was not in pt's current medication list but we received a refill request for it. I spoke with pt and she confirmed that she is still taking the medication. I have pended for your approval.

## 2023-09-12 NOTE — Progress Notes (Unsigned)
Medication was not in pt's current medication list but we received a refill request for it. I spoke with pt and she confirmed that she is still taking the medication. I have pended for your approval.

## 2023-09-21 ENCOUNTER — Ambulatory Visit (INDEPENDENT_AMBULATORY_CARE_PROVIDER_SITE_OTHER): Payer: Medicare HMO | Admitting: Internal Medicine

## 2023-09-21 ENCOUNTER — Encounter: Payer: Self-pay | Admitting: Internal Medicine

## 2023-09-21 VITALS — BP 130/58 | HR 88 | Ht <= 58 in | Wt 142.8 lb

## 2023-09-21 DIAGNOSIS — E785 Hyperlipidemia, unspecified: Secondary | ICD-10-CM

## 2023-09-21 DIAGNOSIS — E1169 Type 2 diabetes mellitus with other specified complication: Secondary | ICD-10-CM

## 2023-09-21 DIAGNOSIS — R3989 Other symptoms and signs involving the genitourinary system: Secondary | ICD-10-CM | POA: Diagnosis not present

## 2023-09-21 DIAGNOSIS — I10 Essential (primary) hypertension: Secondary | ICD-10-CM

## 2023-09-21 DIAGNOSIS — E1151 Type 2 diabetes mellitus with diabetic peripheral angiopathy without gangrene: Secondary | ICD-10-CM | POA: Diagnosis not present

## 2023-09-21 DIAGNOSIS — I150 Renovascular hypertension: Secondary | ICD-10-CM | POA: Diagnosis not present

## 2023-09-21 DIAGNOSIS — E1121 Type 2 diabetes mellitus with diabetic nephropathy: Secondary | ICD-10-CM

## 2023-09-21 DIAGNOSIS — Z780 Asymptomatic menopausal state: Secondary | ICD-10-CM

## 2023-09-21 LAB — LIPID PANEL
Cholesterol: 136 mg/dL (ref 0–200)
HDL: 34.8 mg/dL — ABNORMAL LOW (ref >39.00)
LDL Cholesterol: 62 mg/dL (ref 0–99)
NonHDL: 101.47
Total CHOL/HDL Ratio: 4
Triglycerides: 196 mg/dL — ABNORMAL HIGH (ref 0.0–149.0)
VLDL: 39.2 mg/dL (ref 0.0–40.0)

## 2023-09-21 LAB — COMPREHENSIVE METABOLIC PANEL
ALT: 9 U/L (ref 0–35)
AST: 10 U/L (ref 0–37)
Albumin: 3.9 g/dL (ref 3.5–5.2)
Alkaline Phosphatase: 99 U/L (ref 39–117)
BUN: 20 mg/dL (ref 6–23)
CO2: 29 meq/L (ref 19–32)
Calcium: 9.3 mg/dL (ref 8.4–10.5)
Chloride: 104 meq/L (ref 96–112)
Creatinine, Ser: 1.24 mg/dL — ABNORMAL HIGH (ref 0.40–1.20)
GFR: 44.73 mL/min — ABNORMAL LOW (ref >60.00)
Glucose, Bld: 97 mg/dL (ref 70–99)
Potassium: 4.1 meq/L (ref 3.5–5.1)
Sodium: 141 meq/L (ref 135–145)
Total Bilirubin: 0.3 mg/dL (ref 0.2–1.2)
Total Protein: 6.7 g/dL (ref 6.0–8.3)

## 2023-09-21 LAB — HEMOGLOBIN A1C: Hgb A1c MFr Bld: 6.4 % (ref 4.6–6.5)

## 2023-09-21 LAB — LDL CHOLESTEROL, DIRECT: Direct LDL: 77 mg/dL

## 2023-09-21 MED ORDER — METOPROLOL SUCCINATE ER 50 MG PO TB24: ORAL_TABLET | ORAL | 1 refills | Status: DC

## 2023-09-21 MED ORDER — PAROXETINE HCL 20 MG PO TABS: 20.0000 mg | ORAL_TABLET | Freq: Every morning | ORAL | 1 refills | Status: DC

## 2023-09-21 NOTE — Assessment & Plan Note (Addendum)
Not at goal.  Regimen change: Stopping maxzide,  given continued use of telmisartan hct , addition of spironolactone  to manage hypokalemia, and metoprolol .  Incresae metoprolol to 50 mg daily

## 2023-09-21 NOTE — Progress Notes (Signed)
Subjective:  Patient ID: Patricia Fields, female    DOB: May 16, 1956  Age: 68 y.o. MRN: [id number]  CC: The primary encounter diagnosis was Postmenopausal estrogen deficiency. Diagnoses of Type 2 diabetes mellitus with peripheral vascular disease (HCC), Hyperlipidemia associated with type 2 diabetes mellitus (HCC), Renovascular hypertension, Bladder pain, Essential hypertension, and Diabetic nephropathy with proteinuria (HCC) were also pertinent to this visit.   HPI Patricia Fields presents for  Chief Complaint  Patient presents with   Medical Management of Chronic Issues    3 month follow up diabetes   1) persistent bladder pain:  accompanied by  foul smelling urine for the past week .  No dysuria, the pain is not constant.  No blood.  History of interstitial cystitis.  2-3 voids nocturnally  not an increase from baseline.   History of kidney stones,  recent Chest CT noted bilateral nephrolithiasis   2) Type 2 DM : she is managing her diabetes with a low GI diet and is losing weight.   3) Dizzy spells  intermittent .  With position .   4) CAD: noted on chest CT.  Taking statin ., ARB   Urged to quit smoking.    Outpatient Medications Prior to Visit  Medication Sig Dispense Refill   albuterol (PROVENTIL) (2.5 MG/3ML) 0.083% nebulizer solution Take 3 mLs (2.5 mg total) by nebulization every 8 (eight) hours as needed for wheezing or shortness of breath. 150 mL 1   albuterol (VENTOLIN HFA) 108 (90 Base) MCG/ACT inhaler Inhale 2 puffs into the lungs every 6 (six) hours as needed for wheezing or shortness of breath. 8 g 2   amitriptyline (ELAVIL) 25 MG tablet Take 25 mg by mouth at bedtime.     aspirin EC 81 MG tablet Take 1 tablet (81 mg total) by mouth daily. 90 tablet 2   atorvastatin (LIPITOR) 40 MG tablet Take 1 tablet (40 mg total) by mouth daily. 90 tablet 1   cyanocobalamin (VITAMIN B12) 1000 MCG/ML injection Inject 1 mL (1,000 mcg total) into the muscle every 30 (thirty) days. 3 mL 3    spironolactone (ALDACTONE) 50 MG tablet TAKE 1 TABLET(50 MG) BY MOUTH DAILY 90 tablet 1   Syringe/Needle, Disp, (SYRINGE 3CC/25GX1") 25G X 1" 3 ML MISC Use to give B12 injections 50 each 0   telmisartan-hydrochlorothiazide (MICARDIS HCT) 80-25 MG tablet TAKE 1 TABLET BY MOUTH DAILY 90 tablet 1   umeclidinium bromide (INCRUSE ELLIPTA) 62.5 MCG/ACT AEPB Inhale 1 puff into the lungs daily. 30 each 11   metoprolol succinate (TOPROL-XL) 25 MG 24 hr tablet TAKE 1 TABLET BY MOUTH EVERY DAY WITH OR IMMEDIATELY FOLLOWING A MEAL 90 tablet 1   PARoxetine (PAXIL) 20 MG tablet Take 1 tablet (20 mg total) by mouth every morning. 90 tablet 1   triamterene-hydrochlorothiazide (DYAZIDE) 37.5-25 MG capsule Take 1 each (1 capsule total) by mouth daily. 90 capsule 1   No facility-administered medications prior to visit.    Review of Systems;  Patient denies headache, fevers, malaise, unintentional weight loss, skin rash, eye pain, sinus congestion and sinus pain, sore throat, dysphagia,  hemoptysis , cough, dyspnea, wheezing, chest pain, palpitations, orthopnea, edema, abdominal pain, nausea, melena, diarrhea, constipation, flank pain, dysuria, hematuria, urinary  Frequency, nocturia, numbness, tingling, seizures,  Focal weakness, Loss of consciousness,  Tremor, insomnia, depression, anxiety, and suicidal ideation.      Objective:  BP (!) 130/58   Pulse 88   Ht 4\' 10"  (1.473 m)   Wt  142 lb 12.8 oz (64.8 kg)   SpO2 96%   BMI 29.85 kg/m   BP Readings from Last 3 Encounters:  09/21/23 (!) 130/58  06/29/23 130/76  06/22/23 120/70    Wt Readings from Last 3 Encounters:  09/21/23 142 lb 12.8 oz (64.8 kg)  06/29/23 140 lb 3.2 oz (63.6 kg)  06/22/23 142 lb 12.8 oz (64.8 kg)    Physical Exam  Lab Results  Component Value Date   HGBA1C 6.4 09/21/2023   HGBA1C 6.6 (H) 06/22/2023   HGBA1C 6.2 03/07/2023    Lab Results  Component Value Date   CREATININE 1.24 (H) 09/21/2023   CREATININE 1.32 (H)  06/22/2023   CREATININE 1.27 (H) 03/14/2023    Lab Results  Component Value Date   WBC 6.9 03/07/2023   HGB 11.6 (L) 03/07/2023   HCT 34.2 (L) 03/07/2023   PLT 271.0 03/07/2023   GLUCOSE 97 09/21/2023   CHOL 136 09/21/2023   TRIG 196.0 (H) 09/21/2023   HDL 34.80 (L) 09/21/2023   LDLDIRECT 77.0 09/21/2023   LDLCALC 62 09/21/2023   ALT 9 09/21/2023   AST 10 09/21/2023   NA 141 09/21/2023   K 4.1 09/21/2023   CL 104 09/21/2023   CREATININE 1.24 (H) 09/21/2023   BUN 20 09/21/2023   CO2 29 09/21/2023   TSH 1.23 03/07/2023   HGBA1C 6.4 09/21/2023   MICROALBUR 3.9 (H) 03/07/2023    CT CHEST LUNG CA SCREEN LOW DOSE W/O CM Result Date: 08/10/2023 CLINICAL DATA:  68 year old female with 33 pack-year history of smoking. Lung cancer screening. EXAM: CT CHEST WITHOUT CONTRAST LOW-DOSE FOR LUNG CANCER SCREENING TECHNIQUE: Multidetector CT imaging of the chest was performed following the standard protocol without IV contrast. RADIATION DOSE REDUCTION: This exam was performed according to the departmental dose-optimization program which includes automated exposure control, adjustment of the mA and/or kV according to patient size and/or use of iterative reconstruction technique. COMPARISON:  None Available. FINDINGS: Cardiovascular: The heart size is normal. No substantial pericardial effusion. Coronary artery calcification is evident. Mild atherosclerotic calcification is noted in the wall of the thoracic aorta. Mediastinum/Nodes: No mediastinal lymphadenopathy. No evidence for gross hilar lymphadenopathy although assessment is limited by the lack of intravenous contrast on the current study. The esophagus has normal imaging features. There is no axillary lymphadenopathy. Lungs/Pleura: Centrilobular emphsyema noted. Scattered tiny bilateral pulmonary nodules are identified measuring up to 2.7 mm. No suspicious pulmonary nodule or mass. No focal airspace consolidation. No pleural effusion. Upper  Abdomen: Nonobstructing stones are seen in the incompletely visualized kidneys bilaterally measuring up to at least 6 mm in the upper pole left kidney. Musculoskeletal: No worrisome lytic or sclerotic osseous abnormality. IMPRESSION: 1. Lung-RADS 2, benign appearance or behavior. Continue annual screening with low-dose chest CT without contrast in 12 months. 2. Bilateral nephrolithiasis. 3. Emphysema (ICD10-J43.9) and Aortic Atherosclerosis (ICD10-170.0) Electronically Signed   By: Kennith Center M.D.   On: 08/10/2023 12:24    Assessment & Plan:  .Postmenopausal estrogen deficiency -     DG Bone Density; Future  Type 2 diabetes mellitus with peripheral vascular disease (HCC) -     Hemoglobin A1c -     Comprehensive metabolic panel  Hyperlipidemia associated with type 2 diabetes mellitus (HCC) Assessment & Plan: Currently well-controlled on current medications .  hemoglobin A1c is at goal of less than 7.0 . Patient is reminded to schedule an annual eye exam and foot exam is normal today. Patient has  microalbuminuria. Patient is tolerating  statin therapy for CAD risk reduction and on ACE/ARB for renal protection and hypertension  Advised Continue atorvastatin,  resume aspirin   Lab Results  Component Value Date   HGBA1C 6.4 09/21/2023   Lab Results  Component Value Date   CHOL 136 09/21/2023   HDL 34.80 (L) 09/21/2023   LDLCALC 62 09/21/2023   LDLDIRECT 77.0 09/21/2023   TRIG 196.0 (H) 09/21/2023   CHOLHDL 4 09/21/2023     Orders: -     Lipid panel -     LDL cholesterol, direct  Renovascular hypertension Assessment & Plan: Not at goal.  Regimen change: Stopping maxzide,  given continued use of telmisartan hct , addition of spironolactone  to manage hypokalemia, and metoprolol .  Incresae metoprolol to 50 mg daily   Bladder pain Assessment & Plan: Secondary to E Coli UTI . Omnicef prescribed   Orders: -     Urinalysis, Routine w reflex microscopic; Future -     Urine  Culture  Essential hypertension -     Metoprolol Succinate ER; TAKE 1 TABLET BY MOUTH EVERY DAY WITH OR IMMEDIATELY FOLLOWING A MEAL  Dispense: 90 tablet; Refill: 1  Diabetic nephropathy with proteinuria (HCC) Assessment & Plan: She has proteinuria managed with use of telmisartan  40 mg daily She is  s/p renal artery stenting   Lab Results  Component Value Date   MICROALBUR 3.9 (H) 03/07/2023   MICROALBUR 2.2 01/28/2022     Other orders -     PARoxetine HCl; Take 1 tablet (20 mg total) by mouth every morning.  Dispense: 90 tablet; Refill: 1     I provided 30 minutes of face-to-face time during this encounter reviewing patient's last visit with me, patient's  most recent visit with cardiology,  nephrology,  and neurology,  recent surgical and non surgical procedures, previous  labs and imaging studies, counseling on currently addressed issues,  and post visit ordering to diagnostics and therapeutics .   Follow-up: Return in about 3 months (around 12/20/2023) for follow up diabetes.   Sherlene Shams, MD

## 2023-09-21 NOTE — Patient Instructions (Addendum)
Please stop the triamterene hct   continue the telmisartan hct , spironolactone,  and metoprolol but increase the metoprolol to 2 tablets daily (50 mg total)   The most important finding  on your chest CT was the presence of calcifications in all of your coronary arteries.  This means you have coronary atherosclerosis (placque buildup)  but it doesn't tell us what degree of stenosis is present in each artery,  only a cardiac catheterization can do that, but it does mean that  you are at risk for having a heart attack.  Please start taking a baby aspirin daily , and quit smoking.  if you are having persistent shortness of breath when you are exercising, it could be from a blood supply that is not meeting the increased demands of the body so let me know and I will refer you back to Dr Mariah Milling

## 2023-09-23 LAB — URINE CULTURE
MICRO NUMBER:: 16020066
SPECIMEN QUALITY:: ADEQUATE

## 2023-09-23 NOTE — Assessment & Plan Note (Addendum)
Currently well-controlled on current medications .  hemoglobin A1c is at goal of less than 7.0 . Patient is reminded to schedule an annual eye exam and foot exam is normal today. Patient has  microalbuminuria. Patient is tolerating statin therapy for CAD risk reduction and on ACE/ARB for renal protection and hypertension  Advised Continue atorvastatin,  resume aspirin   Lab Results  Component Value Date   HGBA1C 6.4 09/21/2023   Lab Results  Component Value Date   CHOL 136 09/21/2023   HDL 34.80 (L) 09/21/2023   LDLCALC 62 09/21/2023   LDLDIRECT 77.0 09/21/2023   TRIG 196.0 (H) 09/21/2023   CHOLHDL 4 09/21/2023

## 2023-09-23 NOTE — Assessment & Plan Note (Signed)
She has proteinuria managed with use of telmisartan  40 mg daily She is  s/p renal artery stenting   Lab Results  Component Value Date   MICROALBUR 3.9 (H) 03/07/2023   MICROALBUR 2.2 01/28/2022

## 2023-09-24 ENCOUNTER — Encounter: Payer: Self-pay | Admitting: Internal Medicine

## 2023-09-24 DIAGNOSIS — R3989 Other symptoms and signs involving the genitourinary system: Secondary | ICD-10-CM | POA: Insufficient documentation

## 2023-09-24 MED ORDER — CEFDINIR 300 MG PO CAPS: 300.0000 mg | ORAL_CAPSULE | Freq: Two times a day (BID) | ORAL | 0 refills | Status: DC

## 2023-09-24 NOTE — Addendum Note (Signed)
Addended by: Sherlene Shams on: 09/24/2023 08:58 PM   Modules accepted: Orders

## 2023-09-24 NOTE — Assessment & Plan Note (Signed)
Secondary to E Coli UTI . Omnicef prescribed

## 2023-10-04 DIAGNOSIS — H903 Sensorineural hearing loss, bilateral: Secondary | ICD-10-CM | POA: Diagnosis not present

## 2023-10-04 DIAGNOSIS — Z45321 Encounter for adjustment and management of cochlear device: Secondary | ICD-10-CM | POA: Diagnosis not present

## 2023-11-05 ENCOUNTER — Other Ambulatory Visit: Payer: Self-pay | Admitting: Internal Medicine

## 2023-12-20 ENCOUNTER — Telehealth: Payer: Self-pay

## 2023-12-20 ENCOUNTER — Ambulatory Visit (INDEPENDENT_AMBULATORY_CARE_PROVIDER_SITE_OTHER): Payer: Medicare HMO | Admitting: Internal Medicine

## 2023-12-20 ENCOUNTER — Encounter: Payer: Self-pay | Admitting: Internal Medicine

## 2023-12-20 ENCOUNTER — Other Ambulatory Visit: Payer: Self-pay

## 2023-12-20 VITALS — BP 150/88 | HR 90 | Temp 97.9°F | Ht <= 58 in | Wt 152.4 lb

## 2023-12-20 DIAGNOSIS — Z780 Asymptomatic menopausal state: Secondary | ICD-10-CM | POA: Diagnosis not present

## 2023-12-20 DIAGNOSIS — J449 Chronic obstructive pulmonary disease, unspecified: Secondary | ICD-10-CM | POA: Insufficient documentation

## 2023-12-20 DIAGNOSIS — Z1211 Encounter for screening for malignant neoplasm of colon: Secondary | ICD-10-CM | POA: Diagnosis not present

## 2023-12-20 DIAGNOSIS — Z1231 Encounter for screening mammogram for malignant neoplasm of breast: Secondary | ICD-10-CM

## 2023-12-20 DIAGNOSIS — E1169 Type 2 diabetes mellitus with other specified complication: Secondary | ICD-10-CM

## 2023-12-20 DIAGNOSIS — J432 Centrilobular emphysema: Secondary | ICD-10-CM | POA: Diagnosis not present

## 2023-12-20 DIAGNOSIS — E1151 Type 2 diabetes mellitus with diabetic peripheral angiopathy without gangrene: Secondary | ICD-10-CM

## 2023-12-20 DIAGNOSIS — Z7985 Long-term (current) use of injectable non-insulin antidiabetic drugs: Secondary | ICD-10-CM

## 2023-12-20 DIAGNOSIS — E785 Hyperlipidemia, unspecified: Secondary | ICD-10-CM

## 2023-12-20 MED ORDER — TRELEGY ELLIPTA 100-62.5-25 MCG/ACT IN AEPB: 1.0000 | INHALATION_SPRAY | Freq: Every day | RESPIRATORY_TRACT | 3 refills | Status: DC

## 2023-12-20 MED ORDER — BREZTRI AEROSPHERE 160-9-4.8 MCG/ACT IN AERO: 2.0000 | INHALATION_SPRAY | Freq: Two times a day (BID) | RESPIRATORY_TRACT | Status: DC

## 2023-12-20 MED ORDER — TIRZEPATIDE 2.5 MG/0.5ML ~~LOC~~ SOAJ: 2.5000 mg | SUBCUTANEOUS | 0 refills | Status: DC

## 2023-12-20 NOTE — Addendum Note (Signed)
 Addended by: Thersia Flax on: 12/20/2023 05:13 PM   Modules accepted: Orders

## 2023-12-20 NOTE — Addendum Note (Signed)
 Addended by: Christofer Shen on: 12/20/2023 05:05 PM   Modules accepted: Orders

## 2023-12-20 NOTE — Telephone Encounter (Signed)
 At check-out, needed to schedule patient for a one month follow-up, but we ended up almost six weeks out because of her schedule.  Patient was given enough medication for one month, so I wanted to be sure Dr. Creta Fields knows.

## 2023-12-20 NOTE — Assessment & Plan Note (Signed)
 Adding mounjaro for weight gain.   LDL is at <70 on current dose of atorvastatin .  Taking ARB as well . Follow up In 3 months with labs

## 2023-12-20 NOTE — Assessment & Plan Note (Signed)
 Currently  using only Incruse.  And prn albuterol  via nebulizer.  Today I am recommending a trial of Breztri.  7 days sample MDI given

## 2023-12-20 NOTE — Patient Instructions (Addendum)
 I'm glad you are quitting smoking !!! For your diabetes and weight :  I recommend a medication to help you lose weight called Mounjaro.  It helps curb cravings for sweets and cigarettes for some patients.   Mounjaro is a medication that is taken as a weekly subcutaneous injection. It is not insulin.  It  causes your pancreas to increase its  own insulin secretion  And also slows down the emptying of your stomach,  So  it decreases your appetite and helps you lose weight.  The dose for the first 4 weekly doses is 2.5 mg .  You may have mild nausea on the first or second day but this should resolve.  If not  ,  stop the medication.   As long as you are losing weight,  you can continue the dose you are on .  Only request and  increase in the dose to  5.0 mg  after 4 weeks if your weight has plateaued.  Let me know and I will change the refill that I have put on file for you of the 2.5 mg dose   2) For your COPD,  a better maintenance medication is available.  I have given you a sample of Breztri .  Take 2 puffs twice daily .Aaron Aas  The sample should last one week  I will send an rx for  a similar medication called Trelegy  to your  mail order pharmacy to continue using You do not need to use the Incrusa if you are using the Breztri or Trelegy .  Trelegy is used only once daily so it will be more convenient   3) Your blood pressure is still high.  We need to get it UNDER 130/80.   Please check your blood pressure daily for a  WEEK at home and send me the readings  in one month so I can determine if you need to start an additional  medication to lower your blood pressure .

## 2023-12-20 NOTE — Progress Notes (Signed)
 Subjective:  Patient ID: Patricia Fields, female    DOB: 1955-08-31  Age: 68 y.o. MRN: [id number]  CC: The primary encounter diagnosis was Encounter for screening mammogram for malignant neoplasm of breast. Diagnoses of Postmenopausal estrogen deficiency, Colon cancer screening, Type 2 diabetes mellitus with peripheral vascular disease (HCC), Hyperlipidemia associated with type 2 diabetes mellitus (HCC), and Centrilobular emphysema (HCC) were also pertinent to this visit.   HPI RANDALL MEGA presents for  Chief Complaint  Patient presents with   Medical Management of Chronic Issues    3 month follow up    1) "I have bronchitis"    she has centrilobular emphysema and pulmonary nodules by Dec Chest CT.  She is using Incrusa daily and prn albuterol  via nebs.   2) type 2 DM/obesity/hypertension:   She  feels generally well,  But is not  exercising regularly . Checking  blood sugars less than once daily at variable times, usually only if she feels she may be having a hypoglycemic event. .  BS have been under 130 fasting and < 150 post prandially.  Denies any recent hypoglyemic events.  Taking  her medications as directed. Following a carbohydrate modified diet 6 days per week. Denies numbness, burning and tingling of extremities. Appetite is increased as she is trying to quit smoking .   HTN meds were not taken until time of visit today .      Outpatient Medications Prior to Visit  Medication Sig Dispense Refill   atorvastatin  (LIPITOR) 40 MG tablet Take 1 tablet (40 mg total) by mouth daily. 90 tablet 1   losartan -hydrochlorothiazide  (HYZAAR) 100-25 MG tablet Take 1 tablet by mouth daily.     PARoxetine  (PAXIL ) 20 MG tablet TAKE 1 TABLET(20 MG) BY MOUTH EVERY MORNING 90 tablet 1   albuterol  (PROVENTIL ) (2.5 MG/3ML) 0.083% nebulizer solution Take 3 mLs (2.5 mg total) by nebulization every 8 (eight) hours as needed for wheezing or shortness of breath. (Patient not taking: Reported on 12/20/2023)  150 mL 1   albuterol  (VENTOLIN  HFA) 108 (90 Base) MCG/ACT inhaler Inhale 2 puffs into the lungs every 6 (six) hours as needed for wheezing or shortness of breath. (Patient not taking: Reported on 12/20/2023) 8 g 2   amitriptyline  (ELAVIL ) 25 MG tablet Take 25 mg by mouth at bedtime. (Patient not taking: Reported on 12/20/2023)     aspirin  EC 81 MG tablet Take 1 tablet (81 mg total) by mouth daily. (Patient not taking: Reported on 12/20/2023) 90 tablet 2   cyanocobalamin  (VITAMIN B12) 1000 MCG/ML injection Inject 1 mL (1,000 mcg total) into the muscle every 30 (thirty) days. (Patient not taking: Reported on 12/20/2023) 3 mL 3   metoprolol  succinate (TOPROL -XL) 50 MG 24 hr tablet TAKE 1 TABLET BY MOUTH EVERY DAY WITH OR IMMEDIATELY FOLLOWING A MEAL (Patient not taking: Reported on 12/20/2023) 90 tablet 1   spironolactone  (ALDACTONE ) 50 MG tablet TAKE 1 TABLET(50 MG) BY MOUTH DAILY (Patient not taking: Reported on 12/20/2023) 90 tablet 1   Syringe/Needle, Disp, (SYRINGE 3CC/25GX1") 25G X 1" 3 ML MISC Use to give B12 injections (Patient not taking: Reported on 12/20/2023) 50 each 0   telmisartan -hydrochlorothiazide  (MICARDIS  HCT) 80-25 MG tablet TAKE 1 TABLET BY MOUTH DAILY (Patient not taking: Reported on 12/20/2023) 90 tablet 1   umeclidinium bromide  (INCRUSE ELLIPTA ) 62.5 MCG/ACT AEPB Inhale 1 puff into the lungs daily. (Patient not taking: Reported on 12/20/2023) 30 each 11   cefdinir  (OMNICEF ) 300 MG capsule Take 1  capsule (300 mg total) by mouth 2 (two) times daily. 6 capsule 0   No facility-administered medications prior to visit.    Review of Systems;  Patient denies headache, fevers, malaise, unintentional weight loss, skin rash, eye pain, sinus congestion and sinus pain, sore throat, dysphagia,  hemoptysis , cough, dyspnea, wheezing, chest pain, palpitations, orthopnea, edema, abdominal pain, nausea, melena, diarrhea, constipation, flank pain, dysuria, hematuria, urinary  Frequency, nocturia, numbness,  tingling, seizures,  Focal weakness, Loss of consciousness,  Tremor, insomnia, depression, anxiety, and suicidal ideation.      Objective:  BP (!) 150/88   Pulse 90   Temp 97.9 F (36.6 C) (Oral)   Ht 4\' 10"  (1.473 m)   Wt 152 lb 6.4 oz (69.1 kg)   SpO2 95%   BMI 31.85 kg/m   BP Readings from Last 3 Encounters:  12/20/23 (!) 150/88  09/21/23 (!) 130/58  06/29/23 130/76    Wt Readings from Last 3 Encounters:  12/20/23 152 lb 6.4 oz (69.1 kg)  09/21/23 142 lb 12.8 oz (64.8 kg)  06/29/23 140 lb 3.2 oz (63.6 kg)    Physical Exam Vitals reviewed.  Constitutional:      General: She is not in acute distress.    Appearance: Normal appearance. She is normal weight. She is not ill-appearing, toxic-appearing or diaphoretic.  HENT:     Head: Normocephalic.  Eyes:     General: No scleral icterus.       Right eye: No discharge.        Left eye: No discharge.     Conjunctiva/sclera: Conjunctivae normal.  Cardiovascular:     Rate and Rhythm: Normal rate and regular rhythm.     Heart sounds: Normal heart sounds.  Pulmonary:     Effort: Pulmonary effort is normal. No respiratory distress.     Breath sounds: Normal breath sounds.  Musculoskeletal:        General: Normal range of motion.  Skin:    General: Skin is warm and dry.  Neurological:     General: No focal deficit present.     Mental Status: She is alert and oriented to person, place, and time. Mental status is at baseline.  Psychiatric:        Mood and Affect: Mood normal.        Behavior: Behavior normal.        Thought Content: Thought content normal.        Judgment: Judgment normal.     Lab Results  Component Value Date   HGBA1C 6.4 09/21/2023   HGBA1C 6.6 (H) 06/22/2023   HGBA1C 6.2 03/07/2023    Lab Results  Component Value Date   CREATININE 1.24 (H) 09/21/2023   CREATININE 1.32 (H) 06/22/2023   CREATININE 1.27 (H) 03/14/2023    Lab Results  Component Value Date   WBC 6.9 03/07/2023   HGB 11.6  (L) 03/07/2023   HCT 34.2 (L) 03/07/2023   PLT 271.0 03/07/2023   GLUCOSE 97 09/21/2023   CHOL 136 09/21/2023   TRIG 196.0 (H) 09/21/2023   HDL 34.80 (L) 09/21/2023   LDLDIRECT 77.0 09/21/2023   LDLCALC 62 09/21/2023   ALT 9 09/21/2023   AST 10 09/21/2023   NA 141 09/21/2023   K 4.1 09/21/2023   CL 104 09/21/2023   CREATININE 1.24 (H) 09/21/2023   BUN 20 09/21/2023   CO2 29 09/21/2023   TSH 1.23 03/07/2023   HGBA1C 6.4 09/21/2023   MICROALBUR 3.9 (H) 03/07/2023  CT CHEST LUNG CA SCREEN LOW DOSE W/O CM Result Date: 08/10/2023 CLINICAL DATA:  68 year old female with 33 pack-year history of smoking. Lung cancer screening. EXAM: CT CHEST WITHOUT CONTRAST LOW-DOSE FOR LUNG CANCER SCREENING TECHNIQUE: Multidetector CT imaging of the chest was performed following the standard protocol without IV contrast. RADIATION DOSE REDUCTION: This exam was performed according to the departmental dose-optimization program which includes automated exposure control, adjustment of the mA and/or kV according to patient size and/or use of iterative reconstruction technique. COMPARISON:  None Available. FINDINGS: Cardiovascular: The heart size is normal. No substantial pericardial effusion. Coronary artery calcification is evident. Mild atherosclerotic calcification is noted in the wall of the thoracic aorta. Mediastinum/Nodes: No mediastinal lymphadenopathy. No evidence for gross hilar lymphadenopathy although assessment is limited by the lack of intravenous contrast on the current study. The esophagus has normal imaging features. There is no axillary lymphadenopathy. Lungs/Pleura: Centrilobular emphsyema noted. Scattered tiny bilateral pulmonary nodules are identified measuring up to 2.7 mm. No suspicious pulmonary nodule or mass. No focal airspace consolidation. No pleural effusion. Upper Abdomen: Nonobstructing stones are seen in the incompletely visualized kidneys bilaterally measuring up to at least 6 mm in  the upper pole left kidney. Musculoskeletal: No worrisome lytic or sclerotic osseous abnormality. IMPRESSION: 1. Lung-RADS 2, benign appearance or behavior. Continue annual screening with low-dose chest CT without contrast in 12 months. 2. Bilateral nephrolithiasis. 3. Emphysema (ICD10-J43.9) and Aortic Atherosclerosis (ICD10-170.0) Electronically Signed   By: Donnal Fusi M.D.   On: 08/10/2023 12:24    Assessment & Plan:  .Encounter for screening mammogram for malignant neoplasm of breast -     3D Screening Mammogram, Left and Right; Future  Postmenopausal estrogen deficiency -     DG Bone Density; Future  Colon cancer screening -     Cologuard  Type 2 diabetes mellitus with peripheral vascular disease (HCC) Assessment & Plan: Adding mounjaro for weight gain.   LDL is at <70 on current dose of atorvastatin .  Taking ARB as well . Follow up In 3 months with labs   Orders: -     Comprehensive metabolic panel with GFR; Future -     Hemoglobin A1c; Future -     Microalbumin / creatinine urine ratio; Future  Hyperlipidemia associated with type 2 diabetes mellitus (HCC) -     Lipid panel; Future -     LDL cholesterol, direct; Future  Centrilobular emphysema (HCC) Assessment & Plan: Currently  using only Incruse.  And prn albuterol  via nebulizer.  Today I am recommending a trial of Breztri .  7 days sample MDI given    Other orders -     Breztri  Aerosphere; Inhale 2 puffs into the lungs in the morning and at bedtime. -     Trelegy Ellipta ; Inhale 1 puff into the lungs daily.  Dispense: 180 each; Refill: 3     I spent 34 minutes on the day of this face to face encounter reviewing patient's prior relevant surgical and non surgical procedures, recent  labs and imaging studies, counseling on weight management,  reviewing the assessment and plan with patient, and post visit ordering and reviewing of  diagnostics and therapeutics with patient  .   Follow-up: Return in about 4 weeks (around  01/17/2024) for follow up diabetes.   Thersia Flax, MD

## 2023-12-20 NOTE — Telephone Encounter (Signed)
 Medication Samples have been provided to the patient.  Drug name: Moujaro       Strength: 2.5 mg        Qty: 1 box  LOT: [id number]  Exp.Date: 02/22/2025  Dosing instructions: Inject 2.5 mg into skin once weekly.  The patient has been instructed regarding the correct time, dose, and frequency of taking this medication, including desired effects and most common side effects.   Patricia Fields 10:51 AM 12/20/2023

## 2023-12-20 NOTE — Telephone Encounter (Signed)
 Medication Samples have been provided to the patient.  Drug name: Breztri       Strength: 160 mcg/ 9 mcg/ 4.8 mcg        Qty: 1 box  LOT: [id number]  Exp.Date: 04/2026  Dosing instructions: Inhale 2 puffs twice daily.   The patient has been instructed regarding the correct time, dose, and frequency of taking this medication, including desired effects and most common side effects.   Patricia Fields 10:59 AM 12/20/2023

## 2023-12-20 NOTE — Telephone Encounter (Addendum)
 error

## 2023-12-20 NOTE — Telephone Encounter (Signed)
 The Mounjaro was not sent to pharmacy so I have pended the medication to be sent to [facility] on [facility].

## 2023-12-20 NOTE — Telephone Encounter (Signed)
 FYI

## 2024-01-30 ENCOUNTER — Ambulatory Visit (INDEPENDENT_AMBULATORY_CARE_PROVIDER_SITE_OTHER): Admitting: Internal Medicine

## 2024-01-30 ENCOUNTER — Ambulatory Visit: Payer: Self-pay | Admitting: Internal Medicine

## 2024-01-30 ENCOUNTER — Encounter: Payer: Self-pay | Admitting: Internal Medicine

## 2024-01-30 VITALS — BP 138/82 | HR 80 | Ht <= 58 in | Wt 147.8 lb

## 2024-01-30 DIAGNOSIS — Z87891 Personal history of nicotine dependence: Secondary | ICD-10-CM

## 2024-01-30 DIAGNOSIS — Z780 Asymptomatic menopausal state: Secondary | ICD-10-CM | POA: Diagnosis not present

## 2024-01-30 DIAGNOSIS — E782 Mixed hyperlipidemia: Secondary | ICD-10-CM

## 2024-01-30 DIAGNOSIS — E785 Hyperlipidemia, unspecified: Secondary | ICD-10-CM

## 2024-01-30 DIAGNOSIS — E1151 Type 2 diabetes mellitus with diabetic peripheral angiopathy without gangrene: Secondary | ICD-10-CM

## 2024-01-30 DIAGNOSIS — N3946 Mixed incontinence: Secondary | ICD-10-CM | POA: Insufficient documentation

## 2024-01-30 DIAGNOSIS — Z1231 Encounter for screening mammogram for malignant neoplasm of breast: Secondary | ICD-10-CM

## 2024-01-30 DIAGNOSIS — E1121 Type 2 diabetes mellitus with diabetic nephropathy: Secondary | ICD-10-CM | POA: Diagnosis not present

## 2024-01-30 DIAGNOSIS — E1169 Type 2 diabetes mellitus with other specified complication: Secondary | ICD-10-CM | POA: Diagnosis not present

## 2024-01-30 LAB — MICROALBUMIN / CREATININE URINE RATIO
Creatinine,U: 204.9 mg/dL
Microalb Creat Ratio: 185.8 mg/g — ABNORMAL HIGH (ref 0.0–30.0)
Microalb, Ur: 38.1 mg/dL — ABNORMAL HIGH (ref 0.0–1.9)

## 2024-01-30 LAB — COMPREHENSIVE METABOLIC PANEL WITH GFR
ALT: 9 U/L (ref 0–35)
AST: 9 U/L (ref 0–37)
Albumin: 3.9 g/dL (ref 3.5–5.2)
Alkaline Phosphatase: 97 U/L (ref 39–117)
BUN: 16 mg/dL (ref 6–23)
CO2: 29 meq/L (ref 19–32)
Calcium: 9.2 mg/dL (ref 8.4–10.5)
Chloride: 106 meq/L (ref 96–112)
Creatinine, Ser: 1.28 mg/dL — ABNORMAL HIGH (ref 0.40–1.20)
GFR: 42.95 mL/min — ABNORMAL LOW (ref >60.00)
Glucose, Bld: 93 mg/dL (ref 70–99)
Potassium: 4 meq/L (ref 3.5–5.1)
Sodium: 141 meq/L (ref 135–145)
Total Bilirubin: 0.4 mg/dL (ref 0.2–1.2)
Total Protein: 6.4 g/dL (ref 6.0–8.3)

## 2024-01-30 LAB — LIPID PANEL
Cholesterol: 194 mg/dL (ref 0–200)
HDL: 57 mg/dL (ref >39.00)
LDL Cholesterol: 116 mg/dL — ABNORMAL HIGH (ref 0–99)
NonHDL: 136.72
Total CHOL/HDL Ratio: 3
Triglycerides: 103 mg/dL (ref 0.0–149.0)
VLDL: 20.6 mg/dL (ref 0.0–40.0)

## 2024-01-30 LAB — LDL CHOLESTEROL, DIRECT: Direct LDL: 118 mg/dL

## 2024-01-30 LAB — HEMOGLOBIN A1C: Hgb A1c MFr Bld: 5.9 % (ref 4.6–6.5)

## 2024-01-30 MED ORDER — ATORVASTATIN CALCIUM 40 MG PO TABS: 40.0000 mg | ORAL_TABLET | Freq: Every day | ORAL | 1 refills | Status: DC

## 2024-01-30 MED ORDER — ATORVASTATIN CALCIUM 80 MG PO TABS: 40.0000 mg | ORAL_TABLET | Freq: Every day | ORAL | 1 refills | Status: DC

## 2024-01-30 NOTE — Assessment & Plan Note (Signed)
 She is tolerating generic Lipitor given elevated risk , for goal LDL < 70.  LDL has become elevated, , Advised to increase dose to 80 mg daily.   Lab Results  Component Value Date   CHOL 194 01/30/2024   HDL 57.00 01/30/2024   LDLCALC 116 (H) 01/30/2024   LDLDIRECT 118.0 01/30/2024   TRIG 103.0 01/30/2024   CHOLHDL 3 01/30/2024   Lab Results  Component Value Date   ALT 9 01/30/2024   AST 9 01/30/2024   ALKPHOS 97 01/30/2024   BILITOT 0.4 01/30/2024

## 2024-01-30 NOTE — Assessment & Plan Note (Signed)
 SHE remains well-controlled  but is intolerant of Mounjaro.  . Patient is up-to-date on eye exams and foot exam is normal today. Patient has worsening microalbuminuria. She is not sure which ARB she is taking.  .  She has been advised to increase dose of statin to 80 mg and has increased microalbuminuria but has mixed stress/urge incontinence so SGLT2 inhibitor would likely not be tolerated  Lab Results  Component Value Date   HGBA1C 5.9 01/30/2024   Lab Results  Component Value Date   MICROALBUR 38.1 (H) 01/30/2024   MICROALBUR 3.9 (H) 03/07/2023     Lab Results  Component Value Date   CHOL 194 01/30/2024   HDL 57.00 01/30/2024   LDLCALC 116 (H) 01/30/2024   LDLDIRECT 118.0 01/30/2024   TRIG 103.0 01/30/2024   CHOLHDL 3 01/30/2024

## 2024-01-30 NOTE — Assessment & Plan Note (Signed)
 She quit 30 days ago.Aaron Aas

## 2024-01-30 NOTE — Assessment & Plan Note (Signed)
 Persistent despite treatment of UTI  referring to Urolog.y

## 2024-01-30 NOTE — Assessment & Plan Note (Signed)
 She has worsening  proteinuria and is not sure whether she is taking max dose ot telmisartn or of losaran  She is  s/p renal artery stenting   Lab Results  Component Value Date   MICROALBUR 38.1 (H) 01/30/2024   MICROALBUR 3.9 (H) 03/07/2023

## 2024-01-30 NOTE — Progress Notes (Signed)
 Subjective:  Patient ID: Patricia Fields, female    DOB: 1955/11/12  Age: 68 y.o. MRN: [id number]  CC: The primary encounter diagnosis was Encounter for screening mammogram for malignant neoplasm of breast. Diagnoses of Postmenopausal estrogen deficiency, Type 2 diabetes mellitus with peripheral vascular disease (HCC), Hyperlipidemia associated with type 2 diabetes mellitus (HCC), Mixed stress and urge urinary incontinence, Mixed hyperlipidemia, Type 2 diabetes mellitus with diabetic nephropathy, without long-term current use of insulin (HCC), History of tobacco use disorder, and Diabetic nephropathy with proteinuria (HCC) were also pertinent to this visit.   HPI Ashley R Carreiro presents for  Chief Complaint  Patient presents with   Medical Management of Chronic Issues    Follow up on diabetes    1) type 2 DM:  seen April 30.  Mounjaro added for weight gain , BMI > 30.  Has lost 5 lbs . But not tolerating the mediication  due to excessive flatulence and eructation .   2) tobacco abuse:  has not smoked in 30 days 3) Grief:  her dear friend died recently  after a brief hospitalization for COVID and end organ damage.  Aaron Aas  3) urinary incontinemce: she has frequent episodes of incontinence both with  urge and stress situations.    Lab Results  Component Value Date   HGBA1C 5.9 01/30/2024      Outpatient Medications Prior to Visit  Medication Sig Dispense Refill   albuterol  (PROVENTIL ) (2.5 MG/3ML) 0.083% nebulizer solution Take 3 mLs (2.5 mg total) by nebulization every 8 (eight) hours as needed for wheezing or shortness of breath. 150 mL 1   albuterol  (VENTOLIN  HFA) 108 (90 Base) MCG/ACT inhaler Inhale 2 puffs into the lungs every 6 (six) hours as needed for wheezing or shortness of breath. 8 g 2   aspirin  EC 81 MG tablet Take 1 tablet (81 mg total) by mouth daily. 90 tablet 2   budeson-glycopyrrolate-formoterol (BREZTRI  AEROSPHERE) 160-9-4.8 MCG/ACT AERO inhaler Inhale 2 puffs into the  lungs in the morning and at bedtime.     Fluticasone -Umeclidin-Vilant (TRELEGY ELLIPTA ) 100-62.5-25 MCG/ACT AEPB Inhale 1 puff into the lungs daily. 180 each 3   losartan -hydrochlorothiazide  (HYZAAR) 100-25 MG tablet Take 1 tablet by mouth daily.     PARoxetine  (PAXIL ) 20 MG tablet TAKE 1 TABLET(20 MG) BY MOUTH EVERY MORNING 90 tablet 1   atorvastatin  (LIPITOR) 40 MG tablet Take 1 tablet (40 mg total) by mouth daily. 90 tablet 1   tirzepatide (MOUNJARO) 2.5 MG/0.5ML Pen Inject 2.5 mg into the skin once a week. 2 mL 0   amitriptyline  (ELAVIL ) 25 MG tablet Take 25 mg by mouth at bedtime. (Patient not taking: Reported on 01/30/2024)     metoprolol  succinate (TOPROL -XL) 50 MG 24 hr tablet TAKE 1 TABLET BY MOUTH EVERY DAY WITH OR IMMEDIATELY FOLLOWING A MEAL (Patient not taking: Reported on 01/30/2024) 90 tablet 1   spironolactone  (ALDACTONE ) 50 MG tablet TAKE 1 TABLET(50 MG) BY MOUTH DAILY (Patient not taking: Reported on 01/30/2024) 90 tablet 1   telmisartan -hydrochlorothiazide  (MICARDIS  HCT) 80-25 MG tablet TAKE 1 TABLET BY MOUTH DAILY (Patient not taking: Reported on 01/30/2024) 90 tablet 1   cyanocobalamin  (VITAMIN B12) 1000 MCG/ML injection Inject 1 mL (1,000 mcg total) into the muscle every 30 (thirty) days. (Patient not taking: Reported on 12/20/2023) 3 mL 3   Syringe/Needle, Disp, (SYRINGE 3CC/25GX1") 25G X 1" 3 ML MISC Use to give B12 injections (Patient not taking: Reported on 01/30/2024) 50 each 0   umeclidinium bromide  (  INCRUSE ELLIPTA ) 62.5 MCG/ACT AEPB Inhale 1 puff into the lungs daily. (Patient not taking: Reported on 01/30/2024) 30 each 11   No facility-administered medications prior to visit.    Review of Systems;  Patient denies headache, fevers, malaise, unintentional weight loss, skin rash, eye pain, sinus congestion and sinus pain, sore throat, dysphagia,  hemoptysis , cough, dyspnea, wheezing, chest pain, palpitations, orthopnea, edema, abdominal pain, nausea, melena, diarrhea,  constipation, flank pain, dysuria, hematuria, urinary  Frequency, nocturia, numbness, tingling, seizures,  Focal weakness, Loss of consciousness,  Tremor, insomnia, depression, anxiety, and suicidal ideation.      Objective:  BP 138/82   Pulse 80   Ht 4\' 10"  (1.473 m)   Wt 147 lb 12.8 oz (67 kg)   SpO2 98%   BMI 30.89 kg/m   BP Readings from Last 3 Encounters:  01/30/24 138/82  12/20/23 (!) 150/88  09/21/23 (!) 130/58    Wt Readings from Last 3 Encounters:  01/30/24 147 lb 12.8 oz (67 kg)  12/20/23 152 lb 6.4 oz (69.1 kg)  09/21/23 142 lb 12.8 oz (64.8 kg)    Physical Exam Vitals reviewed.  Constitutional:      General: She is not in acute distress.    Appearance: Normal appearance. She is normal weight. She is not ill-appearing, toxic-appearing or diaphoretic.  HENT:     Head: Normocephalic.  Eyes:     General: No scleral icterus.       Right eye: No discharge.        Left eye: No discharge.     Conjunctiva/sclera: Conjunctivae normal.  Cardiovascular:     Rate and Rhythm: Normal rate and regular rhythm.     Heart sounds: Normal heart sounds.  Pulmonary:     Effort: Pulmonary effort is normal. No respiratory distress.     Breath sounds: Normal breath sounds.  Musculoskeletal:        General: Normal range of motion.  Skin:    General: Skin is warm and dry.  Neurological:     General: No focal deficit present.     Mental Status: She is alert and oriented to person, place, and time. Mental status is at baseline.  Psychiatric:        Mood and Affect: Mood normal.        Behavior: Behavior normal.        Thought Content: Thought content normal.        Judgment: Judgment normal.     Lab Results  Component Value Date   HGBA1C 5.9 01/30/2024   HGBA1C 6.4 09/21/2023   HGBA1C 6.6 (H) 06/22/2023    Lab Results  Component Value Date   CREATININE 1.28 (H) 01/30/2024   CREATININE 1.24 (H) 09/21/2023   CREATININE 1.32 (H) 06/22/2023    Lab Results  Component  Value Date   WBC 6.9 03/07/2023   HGB 11.6 (L) 03/07/2023   HCT 34.2 (L) 03/07/2023   PLT 271.0 03/07/2023   GLUCOSE 93 01/30/2024   CHOL 194 01/30/2024   TRIG 103.0 01/30/2024   HDL 57.00 01/30/2024   LDLDIRECT 118.0 01/30/2024   LDLCALC 116 (H) 01/30/2024   ALT 9 01/30/2024   AST 9 01/30/2024   NA 141 01/30/2024   K 4.0 01/30/2024   CL 106 01/30/2024   CREATININE 1.28 (H) 01/30/2024   BUN 16 01/30/2024   CO2 29 01/30/2024   TSH 1.23 03/07/2023   HGBA1C 5.9 01/30/2024   MICROALBUR 38.1 (H) 01/30/2024    CT CHEST  LUNG CA SCREEN LOW DOSE W/O CM Result Date: 08/10/2023 CLINICAL DATA:  68 year old female with 33 pack-year history of smoking. Lung cancer screening. EXAM: CT CHEST WITHOUT CONTRAST LOW-DOSE FOR LUNG CANCER SCREENING TECHNIQUE: Multidetector CT imaging of the chest was performed following the standard protocol without IV contrast. RADIATION DOSE REDUCTION: This exam was performed according to the departmental dose-optimization program which includes automated exposure control, adjustment of the mA and/or kV according to patient size and/or use of iterative reconstruction technique. COMPARISON:  None Available. FINDINGS: Cardiovascular: The heart size is normal. No substantial pericardial effusion. Coronary artery calcification is evident. Mild atherosclerotic calcification is noted in the wall of the thoracic aorta. Mediastinum/Nodes: No mediastinal lymphadenopathy. No evidence for gross hilar lymphadenopathy although assessment is limited by the lack of intravenous contrast on the current study. The esophagus has normal imaging features. There is no axillary lymphadenopathy. Lungs/Pleura: Centrilobular emphsyema noted. Scattered tiny bilateral pulmonary nodules are identified measuring up to 2.7 mm. No suspicious pulmonary nodule or mass. No focal airspace consolidation. No pleural effusion. Upper Abdomen: Nonobstructing stones are seen in the incompletely visualized kidneys  bilaterally measuring up to at least 6 mm in the upper pole left kidney. Musculoskeletal: No worrisome lytic or sclerotic osseous abnormality. IMPRESSION: 1. Lung-RADS 2, benign appearance or behavior. Continue annual screening with low-dose chest CT without contrast in 12 months. 2. Bilateral nephrolithiasis. 3. Emphysema (ICD10-J43.9) and Aortic Atherosclerosis (ICD10-170.0) Electronically Signed   By: Donnal Fusi M.D.   On: 08/10/2023 12:24    Assessment & Plan:  .Encounter for screening mammogram for malignant neoplasm of breast -     3D Screening Mammogram, Left and Right; Future  Postmenopausal estrogen deficiency -     DG Bone Density; Future  Type 2 diabetes mellitus with peripheral vascular disease (HCC) -     Microalbumin / creatinine urine ratio -     Hemoglobin A1c -     Comprehensive metabolic panel with GFR  Hyperlipidemia associated with type 2 diabetes mellitus (HCC) -     LDL cholesterol, direct -     Lipid panel  Mixed stress and urge urinary incontinence Assessment & Plan: Persistent despite treatment of UTI  referring to Urolog.y  Orders: -     Ambulatory referral to Urology  Mixed hyperlipidemia Assessment & Plan: She is tolerating generic Lipitor given elevated risk , for goal LDL < 70.  LDL has become elevated, , Advised to increase dose to 80 mg daily.   Lab Results  Component Value Date   CHOL 194 01/30/2024   HDL 57.00 01/30/2024   LDLCALC 116 (H) 01/30/2024   LDLDIRECT 118.0 01/30/2024   TRIG 103.0 01/30/2024   CHOLHDL 3 01/30/2024   Lab Results  Component Value Date   ALT 9 01/30/2024   AST 9 01/30/2024   ALKPHOS 97 01/30/2024   BILITOT 0.4 01/30/2024      Type 2 diabetes mellitus with diabetic nephropathy, without long-term current use of insulin (HCC) Assessment & Plan:  SHE remains well-controlled  but is intolerant of Mounjaro.  . Patient is up-to-date on eye exams and foot exam is normal today. Patient has worsening  microalbuminuria. She is not sure which ARB she is taking.  .  She has been advised to increase dose of statin to 80 mg and has increased microalbuminuria but has mixed stress/urge incontinence so SGLT2 inhibitor would likely not be tolerated  Lab Results  Component Value Date   HGBA1C 5.9 01/30/2024   Lab Results  Component Value Date   MICROALBUR 38.1 (H) 01/30/2024   MICROALBUR 3.9 (H) 03/07/2023     Lab Results  Component Value Date   CHOL 194 01/30/2024   HDL 57.00 01/30/2024   LDLCALC 116 (H) 01/30/2024   LDLDIRECT 118.0 01/30/2024   TRIG 103.0 01/30/2024   CHOLHDL 3 01/30/2024      History of tobacco use disorder Assessment & Plan: She quit 30 days ago..     Diabetic nephropathy with proteinuria (HCC) Assessment & Plan: She has worsening  proteinuria and is not sure whether she is taking max dose ot telmisartn or of losaran  She is  s/p renal artery stenting   Lab Results  Component Value Date   MICROALBUR 38.1 (H) 01/30/2024   MICROALBUR 3.9 (H) 03/07/2023     Other orders -     Atorvastatin  Calcium ; Take 0.5 tablets (40 mg total) by mouth daily.  Dispense: 90 tablet; Refill: 1     I spent 34 minutes on the day of this face to face encounter reviewing patient's  prior relevant surgical and non surgical procedures, recent  labs and imaging studies, counseling on weight management,  reviewing the assessment and plan with patient, and post visit ordering and reviewing of  diagnostics and therapeutics with patient  .   Follow-up: No follow-ups on file.   Thersia Flax, MD

## 2024-01-30 NOTE — Patient Instructions (Addendum)
 You do not need to continue Mounjaro .  We'll  get an A1c today and then repeat it in 3 months   You DO NEED to be taking either telmisartan  or losartan  for your blood pressure because you have  microscopic proteinuria.  Microscopic amounts of protein in the urine means that either diabetes or hypertension is starting to affect your kidney's ability to  filter protein out of the urine .   Please  have your blood pressure checked as we discussed,  because if your readings are not below 130/80, I will recommend adjusting your dose  of telmisartan  or  losartan . They  can  prevent the proteinuria from getting worse by lowering the amount of pressure exerted on the kidneys by lowering your blood pressure .  Your annual mammogram AND  DEXA  SCAN have been ordered.  Please call [place] to call to make your appointments  .  The phone number for Tony Frederickson is  [phone]

## 2024-02-01 ENCOUNTER — Telehealth: Payer: Self-pay | Admitting: Internal Medicine

## 2024-02-01 NOTE — Telephone Encounter (Signed)
 Copied from CRM [id number]. Topic: Clinical - Medical Advice >> Feb 01, 2024  2:46 PM Shereese L wrote: Reason for CRM: patient is requesting a call back from the nurse or doctor

## 2024-02-02 NOTE — Telephone Encounter (Signed)
 Patient called back to follow-up. I asked her what it was regarding and she replied that she doesn't find it necessary to explain to me why she needs to speak to her doctor & nurse. She then mentioned that it was about a medication but would not specify which one. Please advise at [phone]

## 2024-02-02 NOTE — Telephone Encounter (Signed)
Attempted to call pt. No answer. Mail box is full.  

## 2024-02-08 ENCOUNTER — Other Ambulatory Visit: Payer: Self-pay | Admitting: Internal Medicine

## 2024-02-08 NOTE — Telephone Encounter (Unsigned)
 Copied from CRM [id number]. Topic: Clinical - Medication Refill >> Feb 08, 2024  1:11 PM Adonis Hoot wrote: Medication: losartan -hydrochlorothiazide  (HYZAAR) 100-25 MG tablet  Has the patient contacted their pharmacy? Yes (Agent: If no, request that the patient contact the pharmacy for the refill. If patient does not wish to contact the pharmacy document the reason why and proceed with request.) (Agent: If yes, when and what did the pharmacy advise?)  This is the patient's preferred pharmacy:  [id number] [place], [place] - [address] AT [facility] OF [address] [place] [address] Phone: [phone] Fax: [phone]  Is this the correct pharmacy for this prescription? Yes If no, delete pharmacy and type the correct one.   Has the prescription been filled recently? No  Is the patient out of the medication? No(10 pills)  Has the patient been seen for an appointment in the last year OR does the patient have an upcoming appointment? Yes  Can we respond through MyChart? Yes  Agent: Please be advised that Rx refills may take up to 3 business days. We ask that you follow-up with your pharmacy.

## 2024-02-11 NOTE — Telephone Encounter (Signed)
 Pended medication request is historical.  Per med list, pt should currently be on telmisartan -hydrochlorothiazide  (MICARDIS  HCT) 80-25 MG tablet   Okay to refuse & remove from med list?

## 2024-02-12 NOTE — Telephone Encounter (Signed)
 Spoke with pt to to find out if she is taking losartan /hydrochlorothiazide  or telmisartan /hydrochlorothiazide . Pt stated that she is taking the losartan /hydrochlorothiazide . I have updated medication list. Pt also would like to know if she should be taking 80 mg or 40 mg of atorvastatin . Per last office note pt was advised to increase to 80 mg however the 80 mg tablet was sent in but the directions state to take half tablet.

## 2024-02-12 NOTE — Addendum Note (Signed)
 Addended by: HARRIETTE RAISIN on: 02/12/2024 02:40 PM   Modules accepted: Orders

## 2024-02-13 MED ORDER — ATORVASTATIN CALCIUM 80 MG PO TABS: 80.0000 mg | ORAL_TABLET | Freq: Every day | ORAL | 1 refills | Status: DC

## 2024-02-13 NOTE — Addendum Note (Signed)
 Addended by: MARYLYNN VERNEITA CROME on: 02/13/2024 08:17 AM   Modules accepted: Orders

## 2024-02-13 NOTE — Telephone Encounter (Signed)
 Pt is aware and gave a verbal understanding.

## 2024-02-22 ENCOUNTER — Telehealth: Payer: Self-pay

## 2024-02-22 ENCOUNTER — Ambulatory Visit: Payer: Self-pay | Admitting: Internal Medicine

## 2024-02-22 ENCOUNTER — Other Ambulatory Visit (INDEPENDENT_AMBULATORY_CARE_PROVIDER_SITE_OTHER)

## 2024-02-22 DIAGNOSIS — R300 Dysuria: Secondary | ICD-10-CM | POA: Diagnosis not present

## 2024-02-22 LAB — URINALYSIS, MICROSCOPIC ONLY

## 2024-02-22 NOTE — Telephone Encounter (Signed)
 Patient states at check-out that Patricia Fields, CMA, told her that Dr. Verneita Kettering would like to see her back in a week.  Dr. Tullo has a full schedule, so I let patient know that I will send her a message and find out how she would like for us  to schedule.

## 2024-02-22 NOTE — Telephone Encounter (Signed)
 Spoke with pt to let her know that the dip done today does not show any signs of a UTI but that we were sending it off for culture and if that is positive Dr. Marylynn will send her an antibiotic in. Pt gave a verbal understanding but would like to know why she is hurting if it doesn't show a UTI. I explained to pt that she could still have a UTI and it's not not showing up on the UA that we did and that was why we sent it out for culture to see if any bacteria grows. Pt stated that she is having flank pain, low back pain, and burning with urination.

## 2024-02-22 NOTE — Telephone Encounter (Signed)
 No available appts for today. Would it be okay to have pt collect a urine and then schedule her for a 15 minute appt next week?

## 2024-02-22 NOTE — Telephone Encounter (Signed)
 FYI Only or Action Required?: Action required by provider: request for appointment.  Patient was last seen in primary care on 01/30/2024 by Marylynn Verneita CROME, MD. Called Nurse Triage reporting Pain. Symptoms began several days ago.  Symptoms are: gradually worsening.  Triage Disposition: See HCP Within 4 Hours (Or PCP Triage)  Patient/caregiver understands and will follow disposition?: No, wishes to speak with PCP                  Copied from CRM [id number]. Topic: Clinical - Red Word Triage >> Feb 22, 2024  9:18 AM Robinson H wrote: [facility] that prompted transfer to Nurse Triage: Bladder pain and back pain Reason for Disposition  Side (flank) or lower back pain present  Answer Assessment - Initial Assessment Questions This RN recommends pt goes to urgent care today as no appt availability in office. Pt declines urgent care as she states she will have to pay money upfront that she does not have. Pt call back number confirmed as [phone].      1. SEVERITY: How bad is the pain?  (e.g., Scale 1-10; mild, moderate, or severe)   - MILD (1-3): complains slightly about urination hurting   - MODERATE (4-7): interferes with normal activities     - SEVERE (8-10): excruciating, unwilling or unable to urinate because of the pain      Burning with urination, 5-6/10 pain level 2. FREQUENCY: How many times have you had painful urination today?      Constant pain but worsens with urination 3. PATTERN: Is pain present every time you urinate or just sometimes?      Yes 4. ONSET: When did the painful urination start?      Two days ago, keeps getting worse 5. FEVER: Do you have a fever? If Yes, ask: What is your temperature, how was it measured, and when did it start?     No 6. PAST UTI: Have you had a urine infection before? If Yes, ask: When was the last time? and What happened that time?      Yes 7. CAUSE: What do you think is causing the painful urination?   (e.g., UTI, scratch, Herpes sore)     Patient states she has kidney stones; pt states last time she had a UTI 8. OTHER SYMPTOMS: Do you have any other symptoms? (e.g., blood in urine, flank pain, genital sores, urgency, vaginal discharge)     Denies blood in urine, urgency  Mid to lower back pain started yesterday (7/10 pain level, intermittent)  Protocols used: Urination Pain - Female-A-AH

## 2024-02-22 NOTE — Telephone Encounter (Signed)
 Pt is scheduled for 9:15 Tuesday morning. Pt is aware.

## 2024-02-22 NOTE — Telephone Encounter (Signed)
 Spoke with pt and scheduled the lab appt for today. Is there somewhere that you would like for me to add pt to our schedule next week for the follow up.

## 2024-02-24 ENCOUNTER — Ambulatory Visit: Payer: Self-pay | Admitting: Internal Medicine

## 2024-02-24 MED ORDER — CIPROFLOXACIN HCL 250 MG PO TABS
250.0000 mg | ORAL_TABLET | Freq: Two times a day (BID) | ORAL | 0 refills | Status: DC
Start: 2024-02-24 — End: 2024-02-24

## 2024-02-24 MED ORDER — CIPROFLOXACIN HCL 250 MG PO TABS: 250.0000 mg | ORAL_TABLET | Freq: Two times a day (BID) | ORAL | 0 refills | Status: AC

## 2024-02-27 ENCOUNTER — Ambulatory Visit (INDEPENDENT_AMBULATORY_CARE_PROVIDER_SITE_OTHER): Admitting: Internal Medicine

## 2024-02-27 ENCOUNTER — Encounter: Payer: Self-pay | Admitting: Internal Medicine

## 2024-02-27 VITALS — BP 190/76 | HR 72 | Ht <= 58 in | Wt 148.6 lb

## 2024-02-27 DIAGNOSIS — I150 Renovascular hypertension: Secondary | ICD-10-CM

## 2024-02-27 DIAGNOSIS — J432 Centrilobular emphysema: Secondary | ICD-10-CM | POA: Diagnosis not present

## 2024-02-27 DIAGNOSIS — N3090 Cystitis, unspecified without hematuria: Secondary | ICD-10-CM

## 2024-02-27 DIAGNOSIS — E782 Mixed hyperlipidemia: Secondary | ICD-10-CM | POA: Diagnosis not present

## 2024-02-27 DIAGNOSIS — E66811 Obesity, class 1: Secondary | ICD-10-CM

## 2024-02-27 DIAGNOSIS — F411 Generalized anxiety disorder: Secondary | ICD-10-CM

## 2024-02-27 LAB — URINE CULTURE

## 2024-02-27 MED ORDER — AMLODIPINE BESYLATE 2.5 MG PO TABS: 2.5000 mg | ORAL_TABLET | Freq: Every day | ORAL | 1 refills | Status: DC

## 2024-02-27 MED ORDER — BREZTRI AEROSPHERE 160-9-4.8 MCG/ACT IN AERO: 2.0000 | INHALATION_SPRAY | Freq: Two times a day (BID) | RESPIRATORY_TRACT | 11 refills | Status: DC

## 2024-02-27 MED ORDER — ATORVASTATIN CALCIUM 80 MG PO TABS: 80.0000 mg | ORAL_TABLET | Freq: Every day | ORAL | 1 refills | Status: DC

## 2024-02-27 NOTE — Assessment & Plan Note (Signed)
Requesting increase In paxil dose due to increased irritability.  Dose increased to 20 mg daily

## 2024-02-27 NOTE — Progress Notes (Unsigned)
 Subjective:  Patient ID: Patricia Fields, female    DOB: 11-10-1955  Age: 68 y.o. MRN: [id number]  CC: The primary encounter diagnosis was Centrilobular emphysema (HCC). Diagnoses of Renovascular hypertension, Cystitis, Mixed hyperlipidemia, Obesity (BMI 30.0-34.9), and Generalized anxiety disorder were also pertinent to this visit.   HPI Patricia Fields presents for  Chief Complaint  Patient presents with   Dysuria   UTI : started taking cipro  on Monday July 7 urine obtained on July 3 confirmed UTI by E Coli sensitivities still pending . Feeling better  regarding symptoms   COPD secondary to tobacco abuse:  she Quit smoking 2  months ago using nicotine  patch the lower dose . Using incruse   but felt that Breztri  was better   HTN:  not checking BP at home.  Taking losartan /hct.   Not taking spironolactone  or metoprolol        Outpatient Medications Prior to Visit  Medication Sig Dispense Refill   albuterol  (PROVENTIL ) (2.5 MG/3ML) 0.083% nebulizer solution Take 3 mLs (2.5 mg total) by nebulization every 8 (eight) hours as needed for wheezing or shortness of breath. 150 mL 1   albuterol  (VENTOLIN  HFA) 108 (90 Base) MCG/ACT inhaler Inhale 2 puffs into the lungs every 6 (six) hours as needed for wheezing or shortness of breath. 8 g 2   aspirin  EC 81 MG tablet Take 1 tablet (81 mg total) by mouth daily. 90 tablet 2   ciprofloxacin  (CIPRO ) 250 MG tablet Take 1 tablet (250 mg total) by mouth 2 (two) times daily for 5 days. 10 tablet 0   Fluticasone -Umeclidin-Vilant (TRELEGY ELLIPTA ) 100-62.5-25 MCG/ACT AEPB Inhale 1 puff into the lungs daily. 180 each 3   losartan -hydrochlorothiazide  (HYZAAR) 100-25 MG tablet Take 1 tablet by mouth daily.     PARoxetine  (PAXIL ) 20 MG tablet TAKE 1 TABLET(20 MG) BY MOUTH EVERY MORNING 90 tablet 1   atorvastatin  (LIPITOR) 80 MG tablet Take 1 tablet (80 mg total) by mouth daily. 90 tablet 1   budeson-glycopyrrolate-formoterol (BREZTRI  AEROSPHERE) 160-9-4.8  MCG/ACT AERO inhaler Inhale 2 puffs into the lungs in the morning and at bedtime.     No facility-administered medications prior to visit.    Review of Systems;  Patient denies headache, fevers, malaise, unintentional weight loss, skin rash, eye pain, sinus congestion and sinus pain, sore throat, dysphagia,  hemoptysis , cough, dyspnea, wheezing, chest pain, palpitations, orthopnea, edema, abdominal pain, nausea, melena, diarrhea, constipation, flank pain, dysuria, hematuria, urinary  Frequency, nocturia, numbness, tingling, seizures,  Focal weakness, Loss of consciousness,  Tremor, insomnia, depression, anxiety, and suicidal ideation.      Objective:  Ht 4' 10 (1.473 m)   BMI 30.89 kg/m   BP Readings from Last 3 Encounters:  01/30/24 138/82  12/20/23 (!) 150/88  09/21/23 (!) 130/58    Wt Readings from Last 3 Encounters:  01/30/24 147 lb 12.8 oz (67 kg)  12/20/23 152 lb 6.4 oz (69.1 kg)  09/21/23 142 lb 12.8 oz (64.8 kg)    Physical Exam Vitals reviewed.  Constitutional:      General: She is not in acute distress.    Appearance: Normal appearance. She is normal weight. She is not ill-appearing, toxic-appearing or diaphoretic.  HENT:     Head: Normocephalic.  Eyes:     General: No scleral icterus.       Right eye: No discharge.        Left eye: No discharge.     Conjunctiva/sclera: Conjunctivae normal.  Cardiovascular:  Rate and Rhythm: Normal rate and regular rhythm.     Heart sounds: Normal heart sounds.  Pulmonary:     Effort: Pulmonary effort is normal. No respiratory distress.     Breath sounds: Normal breath sounds.  Musculoskeletal:        General: Normal range of motion.  Skin:    General: Skin is warm and dry.  Neurological:     General: No focal deficit present.     Mental Status: She is alert and oriented to person, place, and time. Mental status is at baseline.  Psychiatric:        Mood and Affect: Mood normal.        Behavior: Behavior normal.         Thought Content: Thought content normal.        Judgment: Judgment normal.     Lab Results  Component Value Date   HGBA1C 5.9 01/30/2024   HGBA1C 6.4 09/21/2023   HGBA1C 6.6 (H) 06/22/2023    Lab Results  Component Value Date   CREATININE 1.28 (H) 01/30/2024   CREATININE 1.24 (H) 09/21/2023   CREATININE 1.32 (H) 06/22/2023    Lab Results  Component Value Date   WBC 6.9 03/07/2023   HGB 11.6 (L) 03/07/2023   HCT 34.2 (L) 03/07/2023   PLT 271.0 03/07/2023   GLUCOSE 93 01/30/2024   CHOL 194 01/30/2024   TRIG 103.0 01/30/2024   HDL 57.00 01/30/2024   LDLDIRECT 118.0 01/30/2024   LDLCALC 116 (H) 01/30/2024   ALT 9 01/30/2024   AST 9 01/30/2024   NA 141 01/30/2024   K 4.0 01/30/2024   CL 106 01/30/2024   CREATININE 1.28 (H) 01/30/2024   BUN 16 01/30/2024   CO2 29 01/30/2024   TSH 1.23 03/07/2023   HGBA1C 5.9 01/30/2024   MICROALBUR 38.1 (H) 01/30/2024    CT CHEST LUNG CA SCREEN LOW DOSE W/O CM Result Date: 08/10/2023 CLINICAL DATA:  68 year old female with 33 pack-year history of smoking. Lung cancer screening. EXAM: CT CHEST WITHOUT CONTRAST LOW-DOSE FOR LUNG CANCER SCREENING TECHNIQUE: Multidetector CT imaging of the chest was performed following the standard protocol without IV contrast. RADIATION DOSE REDUCTION: This exam was performed according to the departmental dose-optimization program which includes automated exposure control, adjustment of the mA and/or kV according to patient size and/or use of iterative reconstruction technique. COMPARISON:  None Available. FINDINGS: Cardiovascular: The heart size is normal. No substantial pericardial effusion. Coronary artery calcification is evident. Mild atherosclerotic calcification is noted in the wall of the thoracic aorta. Mediastinum/Nodes: No mediastinal lymphadenopathy. No evidence for gross hilar lymphadenopathy although assessment is limited by the lack of intravenous contrast on the current study. The esophagus  has normal imaging features. There is no axillary lymphadenopathy. Lungs/Pleura: Centrilobular emphsyema noted. Scattered tiny bilateral pulmonary nodules are identified measuring up to 2.7 mm. No suspicious pulmonary nodule or mass. No focal airspace consolidation. No pleural effusion. Upper Abdomen: Nonobstructing stones are seen in the incompletely visualized kidneys bilaterally measuring up to at least 6 mm in the upper pole left kidney. Musculoskeletal: No worrisome lytic or sclerotic osseous abnormality. IMPRESSION: 1. Lung-RADS 2, benign appearance or behavior. Continue annual screening with low-dose chest CT without contrast in 12 months. 2. Bilateral nephrolithiasis. 3. Emphysema (ICD10-J43.9) and Aortic Atherosclerosis (ICD10-170.0) Electronically Signed   By: Camellia Candle M.D.   On: 08/10/2023 12:24    Assessment & Plan:  .Centrilobular emphysema (HCC) Assessment & Plan: Currently asymptomatic USING ON LY Incruse.  Recommend using  BREZTRI  if ocvered by insurance.    Renovascular hypertension Assessment & Plan: Not at goal on losartan  hct  100/25 , .  metoprolol  50 mg daily.  Adding amlodipine  2.5 mg daily   Lab Results  Component Value Date   NA 141 01/30/2024   K 4.0 01/30/2024   CL 106 01/30/2024   CO2 29 01/30/2024      Cystitis Assessment & Plan: Continue cipro  for preliminary results of urine culture showing >100K E coli, sensitivities pending    Mixed hyperlipidemia Assessment & Plan: Dose of atorvastatin  was increased to 80 mg daily but patient has not increased her dose.  Repeat rx sent    Obesity (BMI 30.0-34.9) Assessment & Plan: There is no height or weight on file to calculate BMI.  She has kept the weight off.  Encouraged her to start exercising     Generalized anxiety disorder Assessment & Plan: Requesting increase In paxil  dose due to increased irritability.  Dose increased to 20 mg daily    Other orders -     Atorvastatin  Calcium ; Take 1  tablet (80 mg total) by mouth daily.  Dispense: 90 tablet; Refill: 1 -     Breztri  Aerosphere; Inhale 2 puffs into the lungs 2 (two) times daily.  Dispense: 10.7 g; Refill: 11 -     amLODIPine  Besylate; Take 1 tablet (2.5 mg total) by mouth daily.  Dispense: 90 tablet; Refill: 1     I spent 34 minutes on the day of this face to face encounter reviewing patient's  most recent visit with cardiology,  nephrology,  and neurology,  prior relevant surgical and non surgical procedures, recent  labs and imaging studies, counseling on weight management,  reviewing the assessment and plan with patient, and post visit ordering and reviewing of  diagnostics and therapeutics with patient  .   Follow-up: Return in about 4 weeks (around 03/26/2024) for hypertension.   Verneita LITTIE Kettering, MD

## 2024-02-27 NOTE — Patient Instructions (Signed)
 CONTINUE THE CIPRO  FOR THE URINARY INFECTION   ADDING AMLODIPINE  2.5 MG FOR BLOOD PRESSURE  CONTINUE LOSARTAN /HCT WHICH IS AT MAXIMAL DOSE  ATORVASTATIN  (CHOLESTEROL ) MEDICATION WAS INCREASED TO 80 MG DAILY   NEW RX HAS BEEN SENT AGAIN TO [facility]   PAXIL  DOSE SHOULD BE 20 MG DAILY.  WE CAN GO UP TO 40 MG IF YOU FEEL YOU NEED IT   ADDING BREZTRI  AS A BETTER CHOICE FOR COPD THAN INCRUSE.

## 2024-02-27 NOTE — Assessment & Plan Note (Signed)
 Not at goal on losartan  hct  100/25 , .  metoprolol  50 mg daily.  Adding amlodipine  2.5 mg daily   Lab Results  Component Value Date   NA 141 01/30/2024   K 4.0 01/30/2024   CL 106 01/30/2024   CO2 29 01/30/2024

## 2024-02-27 NOTE — Assessment & Plan Note (Signed)
 There is no height or weight on file to calculate BMI.  She has kept the weight off.  Encouraged her to start exercising

## 2024-02-27 NOTE — Assessment & Plan Note (Addendum)
 Currently asymptomatic USING ON LY Incruse.   Recommend using  BREZTRI  if ocvered by insurance.

## 2024-02-27 NOTE — Assessment & Plan Note (Signed)
 Continue cipro  for preliminary results of urine culture showing >100K E coli, sensitivities pending

## 2024-02-27 NOTE — Assessment & Plan Note (Signed)
 Dose of atorvastatin  was increased to 80 mg daily but patient has not increased her dose.  Repeat rx sent

## 2024-03-13 ENCOUNTER — Encounter: Payer: Self-pay | Admitting: Acute Care

## 2024-04-01 ENCOUNTER — Other Ambulatory Visit: Payer: Self-pay

## 2024-04-01 DIAGNOSIS — I150 Renovascular hypertension: Secondary | ICD-10-CM

## 2024-04-01 NOTE — Telephone Encounter (Signed)
 Attempted to call pt. No answer. Mailbox is full. Need to verify with pt that she did switch her pharmacy to [facility].

## 2024-04-01 NOTE — Telephone Encounter (Signed)
 Copied from CRM #[id number]. Topic: Clinical - Prescription Issue >> Apr 01, 2024  9:16 AM Essie A wrote: Reason for CRM: Patient recently became a member of [employer]  Tamia from the pharmacy is calling to get all of the patient's prescriptions transferred to [facility] pharmacy.  Please return her call at [phone] and fax number is [phone].  Thanks.

## 2024-04-03 NOTE — Telephone Encounter (Unsigned)
 Copied from CRM [id number]. Topic: General - Other >> Apr 03, 2024  9:33 AM Pinkey ORN wrote: Reason for CRM: [facility] >> Apr 03, 2024  9:35 AM Pinkey ORN wrote: [facility] called in regards to patient medications being faxed / transferred over. Patient was on the added call and confirms that she in fact, did switch to [facility] pharmacy.

## 2024-04-04 ENCOUNTER — Other Ambulatory Visit: Payer: Self-pay | Admitting: Internal Medicine

## 2024-04-04 MED ORDER — LOSARTAN POTASSIUM-HCTZ 100-25 MG PO TABS: 1.0000 | ORAL_TABLET | Freq: Every day | ORAL | 0 refills | Status: DC

## 2024-04-04 MED ORDER — TRELEGY ELLIPTA 100-62.5-25 MCG/ACT IN AEPB: 1.0000 | INHALATION_SPRAY | Freq: Every day | RESPIRATORY_TRACT | 3 refills | Status: AC

## 2024-04-04 MED ORDER — BREZTRI AEROSPHERE 160-9-4.8 MCG/ACT IN AERO: 2.0000 | INHALATION_SPRAY | Freq: Two times a day (BID) | RESPIRATORY_TRACT | 11 refills | Status: AC

## 2024-04-04 MED ORDER — AMLODIPINE BESYLATE 2.5 MG PO TABS: 2.5000 mg | ORAL_TABLET | Freq: Every day | ORAL | 1 refills | Status: DC

## 2024-04-04 MED ORDER — ATORVASTATIN CALCIUM 80 MG PO TABS: 80.0000 mg | ORAL_TABLET | Freq: Every day | ORAL | 1 refills | Status: AC

## 2024-04-04 MED ORDER — PAROXETINE HCL 20 MG PO TABS: ORAL_TABLET | ORAL | 1 refills | Status: DC

## 2024-04-04 NOTE — Assessment & Plan Note (Signed)
 Not at goal on losartan  hct  100/25 , .  metoprolol  50 mg daily.  Adding amlodipine  2.5 mg daily   Lab Results  Component Value Date   NA 141 01/30/2024   K 4.0 01/30/2024   CL 106 01/30/2024   CO2 29 01/30/2024

## 2024-04-04 NOTE — Telephone Encounter (Signed)
 Losartan  hct refilled

## 2024-04-04 NOTE — Addendum Note (Signed)
 Addended by: Keyon Winnick on: 04/04/2024 03:53 PM   Modules accepted: Orders

## 2024-04-04 NOTE — Telephone Encounter (Signed)
 Spoke with pt and she has switched pharmacy to [facility] pharmacy and needs all her medication sent to them. I have refilled everything except the losartan /hydrochlorothiazide  because it is listed as a historical provider. I have pended refill request for your approval.

## 2024-04-10 ENCOUNTER — Ambulatory Visit (INDEPENDENT_AMBULATORY_CARE_PROVIDER_SITE_OTHER): Admitting: *Deleted

## 2024-04-10 VITALS — Ht <= 58 in | Wt 152.0 lb

## 2024-04-10 DIAGNOSIS — Z0000 Encounter for general adult medical examination without abnormal findings: Secondary | ICD-10-CM | POA: Diagnosis not present

## 2024-04-10 NOTE — Patient Instructions (Addendum)
 Patricia Fields , Thank you for taking time out of your busy schedule to complete your Annual Wellness Visit with me. I enjoyed our conversation and look forward to speaking with you again next year. I, as well as your care team,  appreciate your ongoing commitment to your health goals. Please review the following plan we discussed and let me know if I can assist you in the future. Your Game plan/ To Do List    Referrals: If you haven't heard from the office you've been referred to, please reach out to them at the phone provided.   Remember to call and schedule your Dexa and mammogram that was ordered 01/30/24. Make sure that you update your shingles, tetanus and flu vaccines as discussed.  Remember to complete the colguard test and mail back.   You have an order for:  []   2D Mammogram  [x]   3D Mammogram  [x]   Bone Density     Please call for appointment:  [facility]  [address] [place] [address] [phone]   Make sure to wear two-piece clothing.  No lotions, powders, or deodorants the day of the appointment. Make sure to bring picture ID and insurance card.  Bring list of medications you are currently taking including any supplements.    Follow up Visits: We will see or speak with you next year for your Next Medicare AWV with our clinical staff 04/15/25 @ 9:30 Have you seen your provider in the last 6 months (3 months if uncontrolled diabetes)? No  Clinician Recommendations:  Aim for 30 minutes of exercise or brisk walking, 6-8 glasses of water, and 5 servings of fruits and vegetables each day.       This is a list of the screenings recommended for you:  Health Maintenance  Topic Date Due   Zoster (Shingles) Vaccine (1 of 2) 04/02/1974   Cologuard (Stool DNA test)  Never done   Eye exam for diabetics  10/30/2019   DEXA scan (bone density measurement)  Never done   DTaP/Tdap/Td vaccine (2 - Td or Tdap) 07/24/2021    Mammogram  10/31/2023   Flu Shot  03/22/2024   Screening for Lung Cancer  07/25/2024   Hemoglobin A1C  07/31/2024   Yearly kidney function blood test for diabetes  01/29/2025   Yearly kidney health urinalysis for diabetes  01/29/2025   Complete foot exam   01/29/2025   Medicare Annual Wellness Visit  04/10/2025   Pneumococcal Vaccine for age over 42  Completed   Hepatitis C Screening  Completed   HPV Vaccine  Aged Out   Meningitis B Vaccine  Aged Out   Colon Cancer Screening  Discontinued   COVID-19 Vaccine  Discontinued    Advanced directives: (Declined) Advance directive discussed with you today. Even though you declined this today, please call our office should you change your mind, and we can give you the proper paperwork for you to fill out. Advance Care Planning is important because it:  [x]  Makes sure you receive the medical care that is consistent with your values, goals, and preferences  [x]  It provides guidance to your family and loved ones and reduces their decisional burden about whether or not they are making the right decisions based on your wishes.

## 2024-04-10 NOTE — Progress Notes (Signed)
 Subjective:   Patricia Fields is a 68 y.o. who presents for a Medicare Wellness preventive visit.  As a reminder, Annual Wellness Visits don't include a physical exam, and some assessments may be limited, especially if this visit is performed virtually. We may recommend an in-person follow-up visit with your provider if needed.  Visit Complete: Virtual I connected with  [name] on 04/10/24 by a audio enabled telemedicine application and verified that I am speaking with the correct person using two identifiers.  Patient Location: Home  Provider Location: Home Office  I discussed the limitations of evaluation and management by telemedicine. The patient expressed understanding and agreed to proceed.  Vital Signs: Because this visit was a virtual/telehealth visit, some criteria may be missing or patient reported. Any vitals not documented were not able to be obtained and vitals that have been documented are patient reported.  VideoDeclined- This patient declined Librarian, academic. Therefore the visit was completed with audio only.  Persons Participating in Visit: Patient.  AWV Questionnaire: No: Patient Medicare AWV questionnaire was not completed prior to this visit.  Cardiac Risk Factors include: advanced age (>44men, >76 women);diabetes mellitus;obesity (BMI >30kg/m2);dyslipidemia;smoking/ tobacco exposure;hypertension     Objective:    Today's Vitals   04/10/24 1043  Weight: 152 lb (68.9 kg)  Height: 4' 10 (1.473 m)   Body mass index is 31.77 kg/m.     04/10/2024   11:00 AM 04/07/2023    9:28 AM 04/05/2022    8:45 AM 02/17/2022   10:34 PM 02/17/2021   12:40 PM 11/10/2020    9:14 AM 01/17/2020    9:51 AM  Advanced Directives  Does Patient Have a Medical Advance Directive? No No No No No No No  Would patient like information on creating a medical advance directive? No - Patient declined No - Patient declined No - Patient declined  No - Patient  declined No - Patient declined No - Patient declined    Current Medications (verified) Outpatient Encounter Medications as of 04/10/2024  Medication Sig   albuterol  (PROVENTIL ) (2.5 MG/3ML) 0.083% nebulizer solution Take 3 mLs (2.5 mg total) by nebulization every 8 (eight) hours as needed for wheezing or shortness of breath.   albuterol  (VENTOLIN  HFA) 108 (90 Base) MCG/ACT inhaler Inhale 2 puffs into the lungs every 6 (six) hours as needed for wheezing or shortness of breath.   amLODipine  (NORVASC ) 2.5 MG tablet Take 1 tablet (2.5 mg total) by mouth daily.   aspirin  EC 81 MG tablet Take 1 tablet (81 mg total) by mouth daily.   atorvastatin  (LIPITOR) 80 MG tablet Take 1 tablet (80 mg total) by mouth daily.   budesonide-glycopyrrolate-formoterol (BREZTRI  AEROSPHERE) 160-9-4.8 MCG/ACT AERO inhaler Inhale 2 puffs into the lungs 2 (two) times daily.   Fluticasone -Umeclidin-Vilant (TRELEGY ELLIPTA ) 100-62.5-25 MCG/ACT AEPB Inhale 1 puff into the lungs daily.   losartan -hydrochlorothiazide  (HYZAAR) 100-25 MG tablet Take 1 tablet by mouth daily.   PARoxetine  (PAXIL ) 20 MG tablet TAKE 1 TABLET(20 MG) BY MOUTH EVERY MORNING   No facility-administered encounter medications on file as of 04/10/2024.    Allergies (verified) Bee pollen, Bee venom, and Tomato   History: Past Medical History:  Diagnosis Date   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ervical region    Chronic kidney disease    renal stent   COPD (chronic obstructive pulmonary disease) (HCC)    Hematemesis 02/18/2022   History of nephrolithiasis    HOH (hard of hearing)  Hyperlipidemia    Hypertension    Interstitial cystitis    Meniere disease    Peripheral arterial disease (HCC) 09/22/2009   by renal artery duplex   Tobacco abuse    one pack daily   Past Surgical History:  Procedure Laterality Date   ABDOMINAL HYSTERECTOMY     arthroscopy     left knee , remote   BREAST BIOPSY Right 10/23/2019   stereo biopsy  calcs/coil clip/ benign   BREAST EXCISIONAL BIOPSY Left 1980's   NEG   BREAST SURGERY     left breast cyst, benign   CARPAL TUNNEL RELEASE     Dr. Cleotilde,  right hand    CATARACT EXTRACTION W/PHACO Right 03/05/2015   Procedure: CATARACT EXTRACTION PHACO AND INTRAOCULAR LENS PLACEMENT (IOC);  Surgeon: Newell Ovens, MD;  Location: [facility] ORS;  Service: Ophthalmology;  Laterality: Right;  US : 00:52.3 AP%: 15.2 CDE: 7.96   COCHLEAR IMPLANT Left 01/2023   KNEE ARTHROSCOPY Left    TOTAL ABDOMINAL HYSTERECTOMY W/ BILATERAL SALPINGOOPHORECTOMY  1992   due to uterine tumor, and endometriosis    Family History  Problem Relation Age of Onset   Coronary artery disease Mother    COPD Mother    Heart disease Mother    Breast cancer Maternal Grandmother    Multiple sclerosis Sister    Stroke Sister    Social History   Socioeconomic History   Marital status: Single    Spouse name: Not on file   Number of children: 1   Years of education: 10   Highest education level: 10th grade  Occupational History   Occupation: Disabled  Tobacco Use   Smoking status: Every Day    Current packs/day: 0.00    Average packs/day: 1 pack/day for 48.3 years (48.3 ttl pk-yrs)    Types: Cigarettes    Start date: 26    Last attempt to quit: 12/2023    Years since quitting: 0.3   Smokeless tobacco: Never   Tobacco comments:    Smokes about 1/2 pack per day  Vaping Use   Vaping status: Never Used  Substance and Sexual Activity   Alcohol use: No    Alcohol/week: 0.0 standard drinks of alcohol   Drug use: No   Sexual activity: Not Currently  Other Topics Concern   Not on file  Social History Narrative   Not on file   Social Drivers of Health   Financial Resource Strain: Low Risk  (04/10/2024)   Overall Financial Resource Strain (CARDIA)    Difficulty of Paying Living Expenses: Not hard at all  Food Insecurity: No Food Insecurity (04/10/2024)   Hunger Vital Sign    Worried About Running Out of  Food in the Last Year: Never true    Ran Out of Food in the Last Year: Never true  Transportation Needs: No Transportation Needs (04/10/2024)   PRAPARE - Administrator, Civil Service (Medical): No    Lack of Transportation (Non-Medical): No  Physical Activity: Inactive (04/10/2024)   Exercise Vital Sign    Days of Exercise per Week: 0 days    Minutes of Exercise per Session: 0 min  Stress: No Stress Concern Present (04/10/2024)   [employer] of Occupational Health - Occupational Stress Questionnaire    Feeling of Stress: Not at all  Social Connections: Socially Isolated (04/10/2024)   Social Connection and Isolation Panel    Frequency of Communication with Friends and Family: More than three times a week  Frequency of Social Gatherings with Friends and Family: More than three times a week    Attends Religious Services: Never    Database administrator or Organizations: No    Attends Engineer, structural: Never    Marital Status: Divorced    Tobacco Counseling Ready to quit: Not Answered Counseling given: Not Answered Tobacco comments: Smokes about 1/2 pack per day    Clinical Intake:  Pre-visit preparation completed: Yes  Pain : No/denies pain     BMI - recorded: 31.77 Nutritional Status: BMI > 30  Obese Nutritional Risks: None Diabetes: Yes CBG done?: No  Lab Results  Component Value Date   HGBA1C 5.9 01/30/2024   HGBA1C 6.4 09/21/2023   HGBA1C 6.6 (H) 06/22/2023     How often do you need to have someone help you when you read instructions, pamphlets, or other written materials from your doctor or pharmacy?: 1 - Never  Interpreter Needed?: No  Information entered by :: R. Jing Howatt LPN   Activities of Daily Living     04/10/2024   10:45 AM  In your present state of health, do you have any difficulty performing the following activities:  Hearing? 1  Vision? 0  Difficulty concentrating or making decisions? 0  Walking or climbing  stairs? 0  Dressing or bathing? 0  Doing errands, shopping? 1  Preparing Food and eating ? N  Using the Toilet? N  In the past six months, have you accidently leaked urine? Y  Do you have problems with loss of bowel control? N  Managing your Medications? N  Managing your Finances? N  Housekeeping or managing your Housekeeping? N    Patient Care Team: Marylynn Verneita CROME, MD as PCP - General (Internal Medicine) Darliss Rogue, MD as PCP - Cardiology (Cardiology) Ruthell Lauraine FALCON, NP as Nurse Practitioner (Pulmonary Disease)  I have updated your Care Teams any recent Medical Services you may have received from other providers in the past year.     Assessment:   This is a routine wellness examination for Patricia Fields.  Hearing/Vision screen Hearing Screening - Comments:: Deaf in left ear Vision Screening - Comments:: readers   Goals Addressed             This Visit's Progress    Patient Stated       Wants to try to quit smoking  again       Depression Screen     04/10/2024   10:54 AM 09/21/2023   10:17 AM 06/29/2023   12:07 PM 06/22/2023    9:34 AM 04/07/2023    9:21 AM 11/21/2022   10:52 AM 05/02/2022    9:50 AM  PHQ 2/9 Scores  PHQ - 2 Score 0 0 0 0 0 0 0  PHQ- 9 Score 5  0 2 3 0     Fall Risk     04/10/2024   10:50 AM 01/30/2024   10:07 AM 09/21/2023   10:17 AM 06/29/2023   12:07 PM 06/22/2023    9:33 AM  Fall Risk   Falls in the past year? 0 0 1 0 1  Number falls in past yr: 0 0 0 0 0  Injury with Fall? 0 0 0 0 0  Risk for fall due to : No Fall Risks No Fall Risks History of fall(s) No Fall Risks History of fall(s)  Follow up Falls evaluation completed;Falls prevention discussed Falls evaluation completed Falls evaluation completed Falls evaluation completed Falls evaluation completed  MEDICARE RISK AT HOME:  Medicare Risk at Home Any stairs in or around the home?: Yes If so, are there any without handrails?: Yes Home free of loose throw rugs in walkways, pet  beds, electrical cords, etc?: Yes Adequate lighting in your home to reduce risk of falls?: Yes Life alert?: No Use of a cane, walker or w/c?: No Grab bars in the bathroom?: Yes Shower chair or bench in shower?: No Elevated toilet seat or a handicapped toilet?: No  TIMED UP AND GO:  Was the test performed?  No  Cognitive Function: 6CIT completed    12/16/2016   10:36 AM  MMSE - Mini Mental State Exam  Orientation to time 5   Orientation to Place 5   Registration 3   Attention/ Calculation 5   Recall 3   Language- name 2 objects 2   Language- repeat 1  Language- follow 3 step command 3   Language- read & follow direction 1   Write a sentence 1   Copy design 1   Total score 30      Data saved with a previous flowsheet row definition        04/10/2024   11:01 AM 04/07/2023    9:29 AM 01/17/2020   10:04 AM 01/16/2019   11:23 AM 01/09/2018   11:01 AM  6CIT Screen  What Year? 0 points  0 points 0 points 0 points  What month? 0 points  0 points 0 points 0 points  What time? 0 points 0 points  0 points 0 points  Count back from 20 0 points 0 points 0 points 0 points 0 points  Months in reverse 0 points 0 points 2 points 0 points 0 points  Repeat phrase 0 points 0 points 2 points 0 points 0 points  Total Score 0 points   0 points 0 points    Immunizations Immunization History  Administered Date(s) Administered   Fluad Quad(high Dose 65+) 10/16/2020   Fluad Trivalent(High Dose 65+) 06/22/2023   Influenza Split 07/25/2011, 09/27/2012   Influenza,inj,Quad PF,6+ Mos 05/29/2013, 08/14/2014, 11/06/2015, 09/19/2016, 07/12/2018   PFIZER(Purple Top)SARS-COV-2 Vaccination 11/29/2019, 12/20/2019   PNEUMOCOCCAL CONJUGATE-20 01/28/2022   Pneumococcal Conjugate-13 05/29/2013   Pneumococcal Polysaccharide-23 08/14/2014   Tdap 07/25/2011   Zoster, Live 08/20/2014    Screening Tests Health Maintenance  Topic Date Due   Zoster Vaccines- Shingrix (1 of 2) 04/02/1974   Fecal DNA  (Cologuard)  Never done   OPHTHALMOLOGY EXAM  10/30/2019   DEXA SCAN  Never done   DTaP/Tdap/Td (2 - Td or Tdap) 07/24/2021   MAMMOGRAM  10/31/2023   Medicare Annual Wellness (AWV)  04/06/2024   INFLUENZA VACCINE  03/22/2024   Lung Cancer Screening  07/25/2024   HEMOGLOBIN A1C  07/31/2024   Diabetic kidney evaluation - eGFR measurement  01/29/2025   Diabetic kidney evaluation - Urine ACR  01/29/2025   FOOT EXAM  01/29/2025   Pneumococcal Vaccine: 50+ Years  Completed   Hepatitis C Screening  Completed   HPV VACCINES  Aged Out   Meningococcal B Vaccine  Aged Out   Colonoscopy  Discontinued   COVID-19 Vaccine  Discontinued    Health Maintenance  Health Maintenance Due  Topic Date Due   Zoster Vaccines- Shingrix (1 of 2) 04/02/1974   Fecal DNA (Cologuard)  Never done   OPHTHALMOLOGY EXAM  10/30/2019   DEXA SCAN  Never done   DTaP/Tdap/Td (2 - Td or Tdap) 07/24/2021   MAMMOGRAM  10/31/2023  Medicare Annual Wellness (AWV)  04/06/2024   INFLUENZA VACCINE  03/22/2024   Health Maintenance Items Addressed: Discussed the need to update shingles, tetanus and flu vaccines  Patient was reminded to call and schedule Dexa and mammogram that was ordered 01/30/24 telephone number provided.  Patient stated that she has the cologuard kit at home and will complete it and mail back.   Additional Screening:  Vision Screening: Recommended annual ophthalmology exams for early detection of glaucoma and other disorders of the eye.  Overdue. Patient stated that she is changing eye doctors and will call her insurance company to find out who is in network. Patient stated that she will call and schedule a diabetic eye exam. Would you like a referral to an eye doctor? No    Dental Screening: Recommended annual dental exams for proper oral hygiene  Community Resource Referral / Chronic Care Management: CRR required this visit?  No   CCM required this visit?  No   Plan:    I have personally  reviewed and noted the following in the patient's chart:   Medical and social history Use of alcohol, tobacco or illicit drugs  Current medications and supplements including opioid prescriptions. Patient is not currently taking opioid prescriptions. Functional ability and status Nutritional status Physical activity Advanced directives List of other physicians Hospitalizations, surgeries, and ER visits in previous 12 months Vitals Screenings to include cognitive, depression, and falls Referrals and appointments  In addition, I have reviewed and discussed with patient certain preventive protocols, quality metrics, and best practice recommendations. A written personalized care plan for preventive services as well as general preventive health recommendations were provided to patient.   Angeline Fredericks, LPN   1/79/7974   After Visit Summary: (MyChart) Due to this being a telephonic visit, the after visit summary with patients personalized plan was offered to patient via MyChart   Notes: Nothing significant to report at this time.

## 2024-04-16 ENCOUNTER — Other Ambulatory Visit: Payer: Self-pay | Admitting: Internal Medicine

## 2024-04-18 NOTE — Telephone Encounter (Unsigned)
 Copied from CRM #[id number]. Topic: Clinical - Prescription Issue >> Apr 18, 2024  1:34 PM Mia F wrote: Reason for CRM: Nat form [facility] calling about 3 inhalers that were sent for therapy and she is checking to see which is current being that all are long acting inhalers  Please call [phone]

## 2024-04-24 ENCOUNTER — Telehealth: Payer: Self-pay | Admitting: Internal Medicine

## 2024-04-24 NOTE — Telephone Encounter (Signed)
 Copied from CRM [id number]. Topic: Clinical - Medication Question >> Apr 24, 2024  3:47 PM Dedra B wrote: Reason for CRM: Silvano from [facility] calling to confirm which inhaler the patient is using. She has 3 on file for her:  Trelegy Ellipta , Incruse Ellipta , and Breztri  Aerosphere. Silvano can be reached at [phone].

## 2024-04-24 NOTE — Telephone Encounter (Signed)
Attempted to call pt. No answer. Mail box is full.  

## 2024-04-29 NOTE — Telephone Encounter (Signed)
 Medication list has been updated.

## 2024-04-29 NOTE — Telephone Encounter (Signed)
 Spoke with pt and clarified which inhalers she was using. Pt stated that the Trelegy and the Breztri . I spoke with the pharmacy and let them know.

## 2024-04-29 NOTE — Addendum Note (Signed)
 Addended by: HARRIETTE RAISIN on: 04/29/2024 03:11 PM   Modules accepted: Orders

## 2024-05-03 ENCOUNTER — Encounter: Payer: Self-pay | Admitting: Internal Medicine

## 2024-05-03 ENCOUNTER — Ambulatory Visit (INDEPENDENT_AMBULATORY_CARE_PROVIDER_SITE_OTHER): Admitting: Internal Medicine

## 2024-05-03 VITALS — BP 154/82 | HR 91 | Ht <= 58 in | Wt 151.8 lb

## 2024-05-03 DIAGNOSIS — E1169 Type 2 diabetes mellitus with other specified complication: Secondary | ICD-10-CM | POA: Diagnosis not present

## 2024-05-03 DIAGNOSIS — I2510 Atherosclerotic heart disease of native coronary artery without angina pectoris: Secondary | ICD-10-CM

## 2024-05-03 DIAGNOSIS — E785 Hyperlipidemia, unspecified: Secondary | ICD-10-CM | POA: Diagnosis not present

## 2024-05-03 DIAGNOSIS — E1151 Type 2 diabetes mellitus with diabetic peripheral angiopathy without gangrene: Secondary | ICD-10-CM | POA: Diagnosis not present

## 2024-05-03 DIAGNOSIS — F411 Generalized anxiety disorder: Secondary | ICD-10-CM

## 2024-05-03 DIAGNOSIS — I150 Renovascular hypertension: Secondary | ICD-10-CM | POA: Diagnosis not present

## 2024-05-03 DIAGNOSIS — E1121 Type 2 diabetes mellitus with diabetic nephropathy: Secondary | ICD-10-CM

## 2024-05-03 DIAGNOSIS — Z79899 Other long term (current) drug therapy: Secondary | ICD-10-CM

## 2024-05-03 DIAGNOSIS — I700 Atherosclerosis of aorta: Secondary | ICD-10-CM | POA: Diagnosis not present

## 2024-05-03 DIAGNOSIS — Z1211 Encounter for screening for malignant neoplasm of colon: Secondary | ICD-10-CM

## 2024-05-03 DIAGNOSIS — Z780 Asymptomatic menopausal state: Secondary | ICD-10-CM | POA: Diagnosis not present

## 2024-05-03 DIAGNOSIS — R0989 Other specified symptoms and signs involving the circulatory and respiratory systems: Secondary | ICD-10-CM | POA: Insufficient documentation

## 2024-05-03 DIAGNOSIS — Z716 Tobacco abuse counseling: Secondary | ICD-10-CM

## 2024-05-03 DIAGNOSIS — Z1231 Encounter for screening mammogram for malignant neoplasm of breast: Secondary | ICD-10-CM

## 2024-05-03 DIAGNOSIS — J432 Centrilobular emphysema: Secondary | ICD-10-CM

## 2024-05-03 DIAGNOSIS — I358 Other nonrheumatic aortic valve disorders: Secondary | ICD-10-CM

## 2024-05-03 LAB — COMPREHENSIVE METABOLIC PANEL WITH GFR
ALT: 13 U/L (ref 0–35)
AST: 14 U/L (ref 0–37)
Albumin: 4.2 g/dL (ref 3.5–5.2)
Alkaline Phosphatase: 122 U/L — ABNORMAL HIGH (ref 39–117)
BUN: 20 mg/dL (ref 6–23)
CO2: 30 meq/L (ref 19–32)
Calcium: 9.9 mg/dL (ref 8.4–10.5)
Chloride: 103 meq/L (ref 96–112)
Creatinine, Ser: 1.09 mg/dL (ref 0.40–1.20)
GFR: 51.99 mL/min — ABNORMAL LOW (ref >60.00)
Glucose, Bld: 103 mg/dL — ABNORMAL HIGH (ref 70–99)
Potassium: 3.3 meq/L — ABNORMAL LOW (ref 3.5–5.1)
Sodium: 141 meq/L (ref 135–145)
Total Bilirubin: 0.5 mg/dL (ref 0.2–1.2)
Total Protein: 6.9 g/dL (ref 6.0–8.3)

## 2024-05-03 LAB — CBC WITH DIFFERENTIAL/PLATELET
Basophils Absolute: 0.1 K/uL (ref 0.0–0.1)
Basophils Relative: 1.1 % (ref 0.0–3.0)
Eosinophils Absolute: 0.2 K/uL (ref 0.0–0.7)
Eosinophils Relative: 3 % (ref 0.0–5.0)
HCT: 43.6 % (ref 36.0–46.0)
Hemoglobin: 14.7 g/dL (ref 12.0–15.0)
Lymphocytes Relative: 33.4 % (ref 12.0–46.0)
Lymphs Abs: 2.5 K/uL (ref 0.7–4.0)
MCHC: 33.6 g/dL (ref 30.0–36.0)
MCV: 91.7 fl (ref 78.0–100.0)
Monocytes Absolute: 0.5 K/uL (ref 0.1–1.0)
Monocytes Relative: 6.1 % (ref 3.0–12.0)
Neutro Abs: 4.2 K/uL (ref 1.4–7.7)
Neutrophils Relative %: 56.4 % (ref 43.0–77.0)
Platelets: 276 K/uL (ref 150.0–400.0)
RBC: 4.75 Mil/uL (ref 3.87–5.11)
RDW: 14.6 % (ref 11.5–15.5)
WBC: 7.5 K/uL (ref 4.0–10.5)

## 2024-05-03 LAB — LDL CHOLESTEROL, DIRECT: Direct LDL: 81 mg/dL

## 2024-05-03 LAB — LIPID PANEL
Cholesterol: 164 mg/dL (ref 0–200)
HDL: 59.7 mg/dL (ref >39.00)
LDL Cholesterol: 83 mg/dL (ref 0–99)
NonHDL: 104.21
Total CHOL/HDL Ratio: 3
Triglycerides: 108 mg/dL (ref 0.0–149.0)
VLDL: 21.6 mg/dL (ref 0.0–40.0)

## 2024-05-03 LAB — HEMOGLOBIN A1C: Hgb A1c MFr Bld: 6.5 % (ref 4.6–6.5)

## 2024-05-03 LAB — TSH: TSH: 1.45 u[IU]/mL (ref 0.35–5.50)

## 2024-05-03 MED ORDER — AMLODIPINE BESYLATE 5 MG PO TABS: 5.0000 mg | ORAL_TABLET | Freq: Every day | ORAL | 1 refills | Status: DC

## 2024-05-03 MED ORDER — PAROXETINE HCL 40 MG PO TABS: 40.0000 mg | ORAL_TABLET | Freq: Every morning | ORAL | 1 refills | Status: AC

## 2024-05-03 NOTE — Assessment & Plan Note (Signed)
 LDL was not at goal on submaximal statin dose and will be repeated today  . Patient is reminded to schedule an annual eye exam and foot exam is normal today. Patient has  microalbuminuria.  Continue atorvastatin ,  spirin   Lab Results  Component Value Date   HGBA1C 5.9 01/30/2024   Lab Results  Component Value Date   CHOL 194 01/30/2024   HDL 57.00 01/30/2024   LDLCALC 116 (H) 01/30/2024   LDLDIRECT 118.0 01/30/2024   TRIG 103.0 01/30/2024   CHOLHDL 3 01/30/2024

## 2024-05-03 NOTE — Assessment & Plan Note (Signed)
 She is at increased risk for PAD due to concurrent h/o T2DM and tobacco abuse.  Referring to [facility] for ABI's

## 2024-05-03 NOTE — Assessment & Plan Note (Signed)
 Not at goal on losartan  hct  100/25 , .  metoprolol  50 mg daily and amlodipine  2.5 mg daily . Increase amlodipine  to 5 mg daily starting today   Lab Results  Component Value Date   NA 141 01/30/2024   K 4.0 01/30/2024   CL 106 01/30/2024   CO2 29 01/30/2024

## 2024-05-03 NOTE — Assessment & Plan Note (Signed)
 Requesting increase In paxil  dose due to increased irritability.  Dose increased to 40 mg daily

## 2024-05-03 NOTE — Patient Instructions (Addendum)
 YOUR MAMMOGRAM and Bone Density Scan ARE BOTH  DUE, PLEASE CALL AND GET THIS SCHEDULED! [facility] - call [phone]   You are also overdue for your colon cancer screening. Cologuard TEST was ordered in April of this year. If you still have the kit at home please complete and send back.   You are overdue for your tetanus booster.  This can be done at your local pharmacy .  [facility] [place]   YOUR BLOOD PRESSURE IS HIGH.  PLEASE Increase the amlodipine   to 5 mg daily   for blood pressure.  Continue the losartan  as well   I HAVE INCREASED THE DOSE OF PAXIL  TO 40 MG DAILY  I AM REFERRING YOU TO CARDIOLOGY TO HAVE THE CIRCULATION IN YOUR LEGS TESTED   I want you to start walking every day for 15 minutes.

## 2024-05-03 NOTE — Assessment & Plan Note (Signed)
 She remains  asymptomatic .  She was given samples of Breztri  and Ellipta, she does not remember which one she felt worked better ,

## 2024-05-03 NOTE — Assessment & Plan Note (Signed)
 She has resumed smokign due to stress,   counselling given.

## 2024-05-03 NOTE — Assessment & Plan Note (Signed)
 SHE remains well-controlled  . Patient is up-to-date on eye exams and foot exam s  she is on maximal dose of losartan  and atorvastatin    .  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  Will consider  Lab Results  Component Value Date   HGBA1C 5.9 01/30/2024   Lab Results  Component Value Date   MICROALBUR 38.1 (H) 01/30/2024   MICROALBUR 2.2 01/28/2022     Lab Results  Component Value Date   CHOL 194 01/30/2024   HDL 57.00 01/30/2024   LDLCALC 116 (H) 01/30/2024   LDLDIRECT 118.0 01/30/2024   TRIG 103.0 01/30/2024   CHOLHDL 3 01/30/2024
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
## 2024-05-04 DIAGNOSIS — S6992XA Unspecified injury of left wrist, hand and finger(s), initial encounter: Secondary | ICD-10-CM | POA: Diagnosis not present

## 2024-05-04 DIAGNOSIS — Z5321 Procedure and treatment not carried out due to patient leaving prior to being seen by health care provider: Secondary | ICD-10-CM | POA: Diagnosis not present

## 2024-05-04 DIAGNOSIS — S6991XA Unspecified injury of right wrist, hand and finger(s), initial encounter: Secondary | ICD-10-CM | POA: Diagnosis not present

## 2024-05-04 DIAGNOSIS — M7989 Other specified soft tissue disorders: Secondary | ICD-10-CM | POA: Diagnosis not present

## 2024-05-04 DIAGNOSIS — M1812 Unilateral primary osteoarthritis of first carpometacarpal joint, left hand: Secondary | ICD-10-CM | POA: Diagnosis not present

## 2024-05-05 ENCOUNTER — Ambulatory Visit: Payer: Self-pay | Admitting: Internal Medicine

## 2024-05-05 DIAGNOSIS — I700 Atherosclerosis of aorta: Secondary | ICD-10-CM | POA: Insufficient documentation

## 2024-05-05 DIAGNOSIS — I2510 Atherosclerotic heart disease of native coronary artery without angina pectoris: Secondary | ICD-10-CM | POA: Insufficient documentation

## 2024-05-05 NOTE — Assessment & Plan Note (Signed)
 Patient is tolerating high potency statin therapy  for risk reduction and  stabilization of atherosclerotic placque noted on prior thoracic  CT which was reviewed during today's visit

## 2024-05-05 NOTE — Assessment & Plan Note (Signed)
 She has no evidence of significant AS or MR by 2023 ECHO

## 2024-05-05 NOTE — Assessment & Plan Note (Signed)
 A1c has risen but is at goal.  She did  not tolerate trial of  mounjaro  for weight gain.   LDL is at  > 70 on maximal dose of atorvastatin .  Recommending adjunctive therapy with Zetia .  Contiue ARB as well.    Lab Results  Component Value Date   HGBA1C 6.5 05/03/2024   Lab Results  Component Value Date   CHOL 164 05/03/2024   HDL 59.70 05/03/2024   LDLCALC 83 05/03/2024   LDLDIRECT 81.0 05/03/2024   TRIG 108.0 05/03/2024   CHOLHDL 3 05/03/2024   Lab Results  Component Value Date   MICROALBUR 38.1 (H) 01/30/2024   MICROALBUR 2.2 01/28/2022    .lastu

## 2024-05-07 MED ORDER — EZETIMIBE 10 MG PO TABS: 10.0000 mg | ORAL_TABLET | Freq: Every day | ORAL | 1 refills | Status: AC

## 2024-06-19 ENCOUNTER — Other Ambulatory Visit: Payer: Self-pay

## 2024-06-19 ENCOUNTER — Ambulatory Visit: Payer: Self-pay

## 2024-06-19 MED ORDER — DOXYCYCLINE HYCLATE 100 MG PO TABS: 100.0000 mg | ORAL_TABLET | Freq: Two times a day (BID) | ORAL | 0 refills | Status: AC

## 2024-06-19 MED ORDER — BENZONATATE 200 MG PO CAPS: 200.0000 mg | ORAL_CAPSULE | Freq: Two times a day (BID) | ORAL | 0 refills | Status: AC | PRN

## 2024-06-19 MED ORDER — DOXYCYCLINE HYCLATE 100 MG PO TABS: 100.0000 mg | ORAL_TABLET | Freq: Two times a day (BID) | ORAL | 0 refills | Status: DC

## 2024-06-19 MED ORDER — PREDNISONE 10 MG PO TABS: ORAL_TABLET | ORAL | 0 refills | Status: DC

## 2024-06-19 MED ORDER — BENZONATATE 200 MG PO CAPS
200.0000 mg | ORAL_CAPSULE | Freq: Two times a day (BID) | ORAL | 0 refills | Status: DC | PRN
Start: 2024-06-19 — End: 2024-06-19

## 2024-06-19 NOTE — Telephone Encounter (Signed)
 FYI Only or Action Required?: Action required by provider: clinical question for provider and update on patient condition.  Patient was last seen in primary care on 05/03/2024 by Marylynn Verneita CROME, MD.  Called Nurse Triage reporting Cough.  Symptoms began yesterday.  Interventions attempted: Nothing.  Symptoms are: unchanged.  Triage Disposition: See HCP Within 4 Hours (Or PCP Triage)  Patient/caregiver understands and will follow disposition?: No, refuses disposition  **See note below**         Copied from CRM #[id number]. Topic: Clinical - Red Word Triage >> Jun 19, 2024  8:55 AM Patricia Fields wrote: Red Word that prompted transfer to Nurse Triage:  - Patient has bronchitis and is not feeling well. - Patient had a fever yesterday night   *Transf. To NT** Reason for Disposition  Wheezing is present  Answer Assessment - Initial Assessment Questions 1. ONSET: When did the cough begin?      X 1 day   2. SEVERITY: How bad is the cough today?      Moderate   3. SPUTUM: Describe the color of your sputum (e.g., none, dry cough; clear, white, yellow, green)     Mild, white   4. HEMOPTYSIS: Are you coughing up any blood? If Yes, ask: How much? (e.g., flecks, streaks, tablespoons, etc.)     No   5. DIFFICULTY BREATHING: Are you having difficulty breathing? If Yes, ask: How bad is it? (e.g., mild, moderate, severe)      No   6. FEVER: Do you have a fever? If Yes, ask: What is your temperature, how was it measured, and when did it start?      Not currently, fever resolved yesterday    7. CARDIAC HISTORY: Do you have any history of heart disease? (e.g., heart attack, congestive heart failure)      No   8. LUNG HISTORY: Do you have any history of lung disease?  (e.g., pulmonary embolus, asthma, emphysema)     COPD  10. OTHER SYMPTOMS: Do you have any other symptoms? (e.g., runny nose, wheezing, chest pain)  Runny nose, wheezing since yesterday.   For  home care patient has not taken any medications. She stated this is bronchitis as she gets it every year. Referred to UC due to lack of appointment availability within a reasonable timeframe per EPIC; patient declined an would like tessalon  pearls prescribed.  Protocols used: Cough - Acute Non-Productive-A-AH

## 2024-06-19 NOTE — Telephone Encounter (Signed)
 Pt called in to let us  know that she has developed bronchitis that she gets every year. Pt stated that she has a bad cough and developed a fever last night. Pt was advised that she would need to be evaluated at UC due to no availability in the office. Pt declined UC and stated that she would like to know if tessalon  pearles could be called in.

## 2024-06-19 NOTE — Telephone Encounter (Signed)
 Spoke with pt and informed her of the medications that have been sent in. Pt gave a verbal understanding and scheduled a follow up next week.

## 2024-06-24 ENCOUNTER — Other Ambulatory Visit: Payer: Self-pay | Admitting: Internal Medicine

## 2024-06-24 ENCOUNTER — Telehealth: Payer: Self-pay | Admitting: Internal Medicine

## 2024-06-24 ENCOUNTER — Ambulatory Visit: Admitting: Internal Medicine

## 2024-06-24 ENCOUNTER — Encounter: Payer: Self-pay | Admitting: Internal Medicine

## 2024-06-24 VITALS — BP 140/68 | HR 103 | Ht <= 58 in | Wt 146.0 lb

## 2024-06-24 DIAGNOSIS — R0989 Other specified symptoms and signs involving the circulatory and respiratory systems: Secondary | ICD-10-CM

## 2024-06-24 DIAGNOSIS — J441 Chronic obstructive pulmonary disease with (acute) exacerbation: Secondary | ICD-10-CM | POA: Diagnosis not present

## 2024-06-24 DIAGNOSIS — E860 Dehydration: Secondary | ICD-10-CM | POA: Diagnosis not present

## 2024-06-24 DIAGNOSIS — F17200 Nicotine dependence, unspecified, uncomplicated: Secondary | ICD-10-CM | POA: Diagnosis not present

## 2024-06-24 MED ORDER — AZITHROMYCIN 500 MG PO TABS: 500.0000 mg | ORAL_TABLET | Freq: Every day | ORAL | 0 refills | Status: AC

## 2024-06-24 MED ORDER — AMLODIPINE BESYLATE 2.5 MG PO TABS: 2.5000 mg | ORAL_TABLET | Freq: Every day | ORAL | 1 refills | Status: AC

## 2024-06-24 MED ORDER — PREDNISONE 10 MG PO TABS: ORAL_TABLET | ORAL | 0 refills | Status: AC

## 2024-06-24 NOTE — Progress Notes (Unsigned)
 Subjective:  Patient ID: Patricia Fields, female    DOB: Jan 27, 1956  Age: 68 y.o. MRN: [id number]  CC: There were no encounter diagnoses.   HPI Patricia Fields presents for No chief complaint on file.  Follow up on recent treatment for  COPD exacerbation . Treated empirically with doxy, prednisone  and benzonatate  on Oct 29 .  Still feels bad:  exhausted , denies shortness of breath and cough  but  no appetite , has not been eating much ,  doesn't drink water,  drinks coffee and diet DP. , urine has been light .   Sleeping  lot but not lethargic    Outpatient Medications Prior to Visit  Medication Sig Dispense Refill   albuterol  (PROVENTIL ) (2.5 MG/3ML) 0.083% nebulizer solution Take 3 mLs (2.5 mg total) by nebulization every 8 (eight) hours as needed for wheezing or shortness of breath. 150 mL 1   albuterol  (VENTOLIN  HFA) 108 (90 Base) MCG/ACT inhaler Inhale 2 puffs into the lungs every 6 (six) hours as needed for wheezing or shortness of breath. 8 g 2   amLODipine  (NORVASC ) 5 MG tablet Take 1 tablet (5 mg total) by mouth daily. 90 tablet 1   aspirin  EC 81 MG tablet Take 1 tablet (81 mg total) by mouth daily. 90 tablet 2   atorvastatin  (LIPITOR) 80 MG tablet Take 1 tablet (80 mg total) by mouth daily. 90 tablet 1   benzonatate  (TESSALON ) 200 MG capsule Take 1 capsule (200 mg total) by mouth 2 (two) times daily as needed for cough. 20 capsule 0   budesonide-glycopyrrolate-formoterol (BREZTRI  AEROSPHERE) 160-9-4.8 MCG/ACT AERO inhaler Inhale 2 puffs into the lungs 2 (two) times daily. 10.7 g 11   doxycycline  (VIBRA -TABS) 100 MG tablet Take 1 tablet (100 mg total) by mouth 2 (two) times daily. 14 tablet 0   ezetimibe  (ZETIA ) 10 MG tablet Take 1 tablet (10 mg total) by mouth daily. 90 tablet 1   Fluticasone -Umeclidin-Vilant (TRELEGY ELLIPTA ) 100-62.5-25 MCG/ACT AEPB Inhale 1 puff into the lungs daily. 180 each 3   losartan -hydrochlorothiazide  (HYZAAR) 100-25 MG tablet TAKE ONE TABLET BY MOUTH DAILY  AT 9 AM 90 tablet 11   PARoxetine  (PAXIL ) 40 MG tablet Take 1 tablet (40 mg total) by mouth in the morning. 90 tablet 1   predniSONE  (DELTASONE ) 10 MG tablet 6 tablets on Day 1 , then reduce by 1 tablet daily until gone 21 tablet 0   No facility-administered medications prior to visit.    Review of Systems;  Patient denies headache, fevers, malaise, unintentional weight loss, skin rash, eye pain, sinus congestion and sinus pain, sore throat, dysphagia,  hemoptysis , cough, dyspnea, wheezing, chest pain, palpitations, orthopnea, edema, abdominal pain, nausea, melena, diarrhea, constipation, flank pain, dysuria, hematuria, urinary  Frequency, nocturia, numbness, tingling, seizures,  Focal weakness, Loss of consciousness,  Tremor, insomnia, depression, anxiety, and suicidal ideation.      Objective:  There were no vitals taken for this visit.  BP Readings from Last 3 Encounters:  05/03/24 (!) 154/82  02/27/24 (!) 190/76  01/30/24 138/82    Wt Readings from Last 3 Encounters:  05/03/24 151 lb 12.8 oz (68.9 kg)  04/10/24 152 lb (68.9 kg)  02/27/24 148 lb 9.6 oz (67.4 kg)    Physical Exam  Lab Results  Component Value Date   HGBA1C 6.5 05/03/2024   HGBA1C 5.9 01/30/2024   HGBA1C 6.4 09/21/2023    Lab Results  Component Value Date   CREATININE 1.09 05/03/2024  CREATININE 1.28 (H) 01/30/2024   CREATININE 1.24 (H) 09/21/2023    Lab Results  Component Value Date   WBC 7.5 05/03/2024   HGB 14.7 05/03/2024   HCT 43.6 05/03/2024   PLT 276.0 05/03/2024   GLUCOSE 103 (H) 05/03/2024   CHOL 164 05/03/2024   TRIG 108.0 05/03/2024   HDL 59.70 05/03/2024   LDLDIRECT 81.0 05/03/2024   LDLCALC 83 05/03/2024   ALT 13 05/03/2024   AST 14 05/03/2024   NA 141 05/03/2024   K 3.3 (L) 05/03/2024   CL 103 05/03/2024   CREATININE 1.09 05/03/2024   BUN 20 05/03/2024   CO2 30 05/03/2024   TSH 1.45 05/03/2024   HGBA1C 6.5 05/03/2024   MICROALBUR 38.1 (H) 01/30/2024    CT CHEST  LUNG CA SCREEN LOW DOSE W/O CM Result Date: 08/10/2023 CLINICAL DATA:  68 year old female with 33 pack-year history of smoking. Lung cancer screening. EXAM: CT CHEST WITHOUT CONTRAST LOW-DOSE FOR LUNG CANCER SCREENING TECHNIQUE: Multidetector CT imaging of the chest was performed following the standard protocol without IV contrast. RADIATION DOSE REDUCTION: This exam was performed according to the departmental dose-optimization program which includes automated exposure control, adjustment of the mA and/or kV according to patient size and/or use of iterative reconstruction technique. COMPARISON:  None Available. FINDINGS: Cardiovascular: The heart size is normal. No substantial pericardial effusion. Coronary artery calcification is evident. Mild atherosclerotic calcification is noted in the wall of the thoracic aorta. Mediastinum/Nodes: No mediastinal lymphadenopathy. No evidence for gross hilar lymphadenopathy although assessment is limited by the lack of intravenous contrast on the current study. The esophagus has normal imaging features. There is no axillary lymphadenopathy. Lungs/Pleura: Centrilobular emphsyema noted. Scattered tiny bilateral pulmonary nodules are identified measuring up to 2.7 mm. No suspicious pulmonary nodule or mass. No focal airspace consolidation. No pleural effusion. Upper Abdomen: Nonobstructing stones are seen in the incompletely visualized kidneys bilaterally measuring up to at least 6 mm in the upper pole left kidney. Musculoskeletal: No worrisome lytic or sclerotic osseous abnormality. IMPRESSION: 1. Lung-RADS 2, benign appearance or behavior. Continue annual screening with low-dose chest CT without contrast in 12 months. 2. Bilateral nephrolithiasis. 3. Emphysema (ICD10-J43.9) and Aortic Atherosclerosis (ICD10-170.0) Electronically Signed   By: Camellia Candle M.D.   On: 08/10/2023 12:24    Assessment & Plan:  .There are no diagnoses linked to this encounter.   I spent 34  minutes on the day of this face to face encounter reviewing patient's  most recent visit with cardiology,  nephrology,  and neurology,  prior relevant surgical and non surgical procedures, recent  labs and imaging studies, counseling on weight management,  reviewing the assessment and plan with patient, and post visit ordering and reviewing of  diagnostics and therapeutics with patient  .   Follow-up: No follow-ups on file.   Patricia LITTIE Kettering, MD

## 2024-06-24 NOTE — Patient Instructions (Signed)
 You are DEYHYDRATED .  YOU NEED TO DRINK DECAFFEINATED BEVERAGES!!!  48 OUNCES OF DECAFFEINATED BEVERAGES MINIMUM EVERY DAY!   I AM REPEATING A PREDNISONE  TAPER BUT THIS IS A LONGER ONE;  6 TABLETS DAIY FOR 3 DAYS,  THEN BEGIN THE TAPERING DOSE  AZITHROMYCIN  FOR 7 DAYS .  YOU CAN STOP THE DOXYCYCLINE   USE YOUR ALBUTEROL  peace pipe 3 times daily for the next week   Stay on your probiotic

## 2024-06-25 DIAGNOSIS — E860 Dehydration: Secondary | ICD-10-CM | POA: Insufficient documentation

## 2024-06-25 DIAGNOSIS — J441 Chronic obstructive pulmonary disease with (acute) exacerbation: Secondary | ICD-10-CM | POA: Insufficient documentation

## 2024-06-25 NOTE — Assessment & Plan Note (Signed)
 Encouraged to increase intake of noncaffeinated beverages

## 2024-06-25 NOTE — Assessment & Plan Note (Signed)
 She has resumed smoking.  Counselling given

## 2024-06-25 NOTE — Assessment & Plan Note (Signed)
 She was referred to [facility] for ABI's but did not return their calls and was dropped from the callback list.  Will refer to [facility]

## 2024-06-25 NOTE — Assessment & Plan Note (Signed)
 Resting sats are lower than usual at 91-92% but do not drop with ambulation.  She has been advised to  continue prednisone  and continue antibiotics with change to azithrmycin.   Smoking cessation advised. .  Daily use of a probiotic advised for 3 weeks.

## 2024-07-03 ENCOUNTER — Other Ambulatory Visit: Payer: Self-pay | Admitting: Internal Medicine

## 2024-07-03 NOTE — Telephone Encounter (Signed)
 Copied from CRM [id number]. Topic: Clinical - Medication Refill >> Jul 03, 2024  3:45 PM Nessti S wrote: Medication: losartan -hydrochlorothiazide  (HYZAAR) 100-25 MG tablet  Has the patient contacted their pharmacy? Yes (Agent: If no, request that the patient contact the pharmacy for the refill. If patient does not wish to contact the pharmacy document the reason why and proceed with request.) (Agent: If yes, when and what did the pharmacy advise?)  This is the patient's preferred pharmacy:  [facility] - [place], [place] - [address] [place] [address] Phone: [phone] Fax: [phone]  Is this the correct pharmacy for this prescription? Yes If no, delete pharmacy and type the correct one.   Has the prescription been filled recently? Yes  Is the patient out of the medication? Yes  Has the patient been seen for an appointment in the last year OR does the patient have an upcoming appointment? Yes  Can we respond through MyChart? Yes  Agent: Please be advised that Rx refills may take up to 3 business days. We ask that you follow-up with your pharmacy.

## 2024-10-31 ENCOUNTER — Ambulatory Visit: Admitting: Internal Medicine

## 2025-04-15 ENCOUNTER — Ambulatory Visit
